# Patient Record
Sex: Female | Born: 1965
Health system: Southern US, Community
[De-identification: ages and names within clinical notes are randomized; demographics above are authoritative.]

## PROBLEM LIST (undated history)

## (undated) DIAGNOSIS — K635 Polyp of colon: Secondary | ICD-10-CM

## (undated) DIAGNOSIS — I071 Rheumatic tricuspid insufficiency: Secondary | ICD-10-CM

## (undated) DIAGNOSIS — N390 Urinary tract infection, site not specified: Secondary | ICD-10-CM

## (undated) DIAGNOSIS — R011 Cardiac murmur, unspecified: Secondary | ICD-10-CM

## (undated) DIAGNOSIS — I340 Nonrheumatic mitral (valve) insufficiency: Secondary | ICD-10-CM

## (undated) DIAGNOSIS — G709 Myoneural disorder, unspecified: Secondary | ICD-10-CM

## (undated) DIAGNOSIS — I10 Essential (primary) hypertension: Secondary | ICD-10-CM

## (undated) DIAGNOSIS — Z8619 Personal history of other infectious and parasitic diseases: Secondary | ICD-10-CM

## (undated) DIAGNOSIS — Z860100 Personal history of colon polyps, unspecified: Secondary | ICD-10-CM

## (undated) DIAGNOSIS — D122 Benign neoplasm of ascending colon: Secondary | ICD-10-CM

## (undated) DIAGNOSIS — F419 Anxiety disorder, unspecified: Secondary | ICD-10-CM

## (undated) DIAGNOSIS — I4891 Unspecified atrial fibrillation: Secondary | ICD-10-CM

## (undated) DIAGNOSIS — E7800 Pure hypercholesterolemia, unspecified: Secondary | ICD-10-CM

## (undated) DIAGNOSIS — R519 Headache, unspecified: Secondary | ICD-10-CM

## (undated) DIAGNOSIS — M069 Rheumatoid arthritis, unspecified: Secondary | ICD-10-CM

## (undated) DIAGNOSIS — R87619 Unspecified abnormal cytological findings in specimens from cervix uteri: Secondary | ICD-10-CM

## (undated) DIAGNOSIS — K8020 Calculus of gallbladder without cholecystitis without obstruction: Secondary | ICD-10-CM

## (undated) DIAGNOSIS — Q600 Renal agenesis, unilateral: Secondary | ICD-10-CM

## (undated) DIAGNOSIS — I471 Supraventricular tachycardia, unspecified: Secondary | ICD-10-CM

## (undated) DIAGNOSIS — Z972 Presence of dental prosthetic device (complete) (partial): Secondary | ICD-10-CM

## (undated) DIAGNOSIS — F4310 Post-traumatic stress disorder, unspecified: Secondary | ICD-10-CM

## (undated) DIAGNOSIS — F329 Major depressive disorder, single episode, unspecified: Secondary | ICD-10-CM

## (undated) DIAGNOSIS — I5189 Other ill-defined heart diseases: Secondary | ICD-10-CM

## (undated) DIAGNOSIS — G629 Polyneuropathy, unspecified: Secondary | ICD-10-CM

## (undated) DIAGNOSIS — E28319 Asymptomatic premature menopause: Secondary | ICD-10-CM

## (undated) DIAGNOSIS — M419 Scoliosis, unspecified: Secondary | ICD-10-CM

## (undated) DIAGNOSIS — Z9884 Bariatric surgery status: Secondary | ICD-10-CM

## (undated) DIAGNOSIS — E611 Iron deficiency: Secondary | ICD-10-CM

## (undated) DIAGNOSIS — E559 Vitamin D deficiency, unspecified: Secondary | ICD-10-CM

## (undated) DIAGNOSIS — E079 Disorder of thyroid, unspecified: Secondary | ICD-10-CM

## (undated) DIAGNOSIS — F32A Depression, unspecified: Secondary | ICD-10-CM

## (undated) DIAGNOSIS — M5136 Other intervertebral disc degeneration, lumbar region: Secondary | ICD-10-CM

## (undated) DIAGNOSIS — G894 Chronic pain syndrome: Secondary | ICD-10-CM

## (undated) DIAGNOSIS — E538 Deficiency of other specified B group vitamins: Secondary | ICD-10-CM

## (undated) DIAGNOSIS — M51369 Other intervertebral disc degeneration, lumbar region without mention of lumbar back pain or lower extremity pain: Secondary | ICD-10-CM

## (undated) DIAGNOSIS — D509 Iron deficiency anemia, unspecified: Secondary | ICD-10-CM

## (undated) DIAGNOSIS — R51 Headache: Secondary | ICD-10-CM

## (undated) HISTORY — PX: ROUX-EN-Y PROCEDURE: SUR1287

## (undated) HISTORY — DX: Vitamin D deficiency, unspecified: E55.9

## (undated) HISTORY — DX: Urinary tract infection, site not specified: N39.0

## (undated) HISTORY — DX: Unspecified abnormal cytological findings in specimens from cervix uteri: R87.619

## (undated) HISTORY — DX: Cardiac murmur, unspecified: R01.1

## (undated) HISTORY — DX: Renal agenesis, unilateral: Q60.0

## (undated) HISTORY — DX: Bariatric surgery status: Z98.84

## (undated) HISTORY — DX: Depression, unspecified: F32.A

## (undated) HISTORY — DX: Personal history of colonic polyps: Z86.010

## (undated) HISTORY — DX: Essential (primary) hypertension: I10

## (undated) HISTORY — DX: Deficiency of other specified B group vitamins: E53.8

## (undated) HISTORY — DX: Polyp of colon: K63.5

## (undated) HISTORY — DX: Anxiety disorder, unspecified: F41.9

## (undated) HISTORY — PX: COSMETIC SURGERY: SHX468

## (undated) HISTORY — DX: Asymptomatic premature menopause: E28.319

## (undated) HISTORY — DX: Headache, unspecified: R51.9

## (undated) HISTORY — DX: Other intervertebral disc degeneration, lumbar region without mention of lumbar back pain or lower extremity pain: M51.369

## (undated) HISTORY — DX: Rheumatoid arthritis, unspecified: M06.9

## (undated) HISTORY — DX: Pure hypercholesterolemia, unspecified: E78.00

## (undated) HISTORY — DX: Unspecified atrial fibrillation: I48.91

## (undated) HISTORY — PX: TOTAL KNEE ARTHROPLASTY: SHX125

## (undated) HISTORY — DX: Benign neoplasm of ascending colon: D12.2

## (undated) HISTORY — DX: Personal history of other infectious and parasitic diseases: Z86.19

## (undated) HISTORY — PX: BREAST ENHANCEMENT SURGERY: SHX7

## (undated) HISTORY — DX: Scoliosis, unspecified: M41.9

## (undated) HISTORY — PX: OTHER SURGICAL HISTORY: SHX169

## (undated) HISTORY — DX: Disorder of thyroid, unspecified: E07.9

## (undated) HISTORY — DX: Chronic pain syndrome: G89.4

## (undated) HISTORY — DX: Calculus of gallbladder without cholecystitis without obstruction: K80.20

## (undated) HISTORY — DX: Headache: R51

## (undated) HISTORY — PX: KNEE ARTHROSCOPY: SUR90

## (undated) HISTORY — DX: Other intervertebral disc degeneration, lumbar region: M51.36

## (undated) HISTORY — DX: Major depressive disorder, single episode, unspecified: F32.9

## (undated) HISTORY — DX: Polyneuropathy, unspecified: G62.9

## (undated) HISTORY — DX: Personal history of colon polyps, unspecified: Z86.0100

## (undated) HISTORY — DX: Myoneural disorder, unspecified: G70.9

## (undated) HISTORY — DX: Iron deficiency: E61.1

## (undated) HISTORY — PX: SPINE SURGERY: SHX786

## (undated) HISTORY — PX: JOINT REPLACEMENT: SHX530

## (undated) MED FILL — Iron Sucrose Inj 20 MG/ML (Fe Equiv): INTRAVENOUS | Qty: 10 | Status: AC

---
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
## 2007-06-01 HISTORY — PX: AUGMENTATION MAMMAPLASTY: SUR837

## 2014-11-02 DIAGNOSIS — Z9884 Bariatric surgery status: Secondary | ICD-10-CM | POA: Insufficient documentation

## 2014-11-04 DIAGNOSIS — M4327 Fusion of spine, lumbosacral region: Secondary | ICD-10-CM | POA: Insufficient documentation

## 2014-11-04 HISTORY — PX: BACK SURGERY: SHX140

## 2014-11-08 DIAGNOSIS — M419 Scoliosis, unspecified: Secondary | ICD-10-CM | POA: Insufficient documentation

## 2014-12-11 DIAGNOSIS — Z5181 Encounter for therapeutic drug level monitoring: Secondary | ICD-10-CM | POA: Insufficient documentation

## 2015-10-16 DIAGNOSIS — Z905 Acquired absence of kidney: Secondary | ICD-10-CM | POA: Insufficient documentation

## 2015-10-16 DIAGNOSIS — D649 Anemia, unspecified: Secondary | ICD-10-CM | POA: Insufficient documentation

## 2017-01-26 DIAGNOSIS — Q600 Renal agenesis, unilateral: Secondary | ICD-10-CM | POA: Insufficient documentation

## 2017-01-27 DIAGNOSIS — G894 Chronic pain syndrome: Secondary | ICD-10-CM | POA: Insufficient documentation

## 2017-01-27 DIAGNOSIS — M5137 Other intervertebral disc degeneration, lumbosacral region: Secondary | ICD-10-CM | POA: Insufficient documentation

## 2017-01-27 DIAGNOSIS — M5136 Other intervertebral disc degeneration, lumbar region: Secondary | ICD-10-CM | POA: Insufficient documentation

## 2017-02-02 DIAGNOSIS — E7800 Pure hypercholesterolemia, unspecified: Secondary | ICD-10-CM | POA: Insufficient documentation

## 2017-03-26 DIAGNOSIS — R87810 Cervical high risk human papillomavirus (HPV) DNA test positive: Secondary | ICD-10-CM | POA: Insufficient documentation

## 2017-05-25 ENCOUNTER — Other Ambulatory Visit: Payer: Self-pay

## 2017-05-25 ENCOUNTER — Encounter: Payer: Self-pay | Admitting: Gastroenterology

## 2017-05-25 ENCOUNTER — Ambulatory Visit (INDEPENDENT_AMBULATORY_CARE_PROVIDER_SITE_OTHER): Payer: 59 | Admitting: Gastroenterology

## 2017-05-25 ENCOUNTER — Encounter (INDEPENDENT_AMBULATORY_CARE_PROVIDER_SITE_OTHER): Payer: Self-pay

## 2017-05-25 VITALS — BP 141/93 | HR 101 | Ht 62.0 in | Wt 140.4 lb

## 2017-05-25 DIAGNOSIS — Z9884 Bariatric surgery status: Secondary | ICD-10-CM

## 2017-05-25 DIAGNOSIS — Z1212 Encounter for screening for malignant neoplasm of rectum: Secondary | ICD-10-CM | POA: Diagnosis not present

## 2017-05-25 DIAGNOSIS — Z1211 Encounter for screening for malignant neoplasm of colon: Secondary | ICD-10-CM

## 2017-05-25 NOTE — Progress Notes (Signed)
Shelby Colon [address]  [place], [place] [address]  Main: [phone]  Fax: [phone]   Gastroenterology Consultation  Referring Provider:     Royal Piedra, [place] Primary Care Physician:  Royal Piedra, [place] Primary Gastroenterologist:  Dr. Vonda Colon Reason for Consultation:    Positive Cologuard        HPI:   Shelby Colon is a 51 y.o. y/o female referred for consultation & management  by Dr. Stan Head, Toney Sang, [place].  Patient with history of chronic pain, history of gastric bypass referred for positive cologuard testing done as part of her annual physical exam.  Patient denies any blood in stool.  Patient denies any weight loss recently.  Denies any abdominal pain.  Denies any nausea vomiting.  Denies any dysphagia.  Patient is adopted, but states that she knows that her biological parents and biological family did not have any history of colon cancer.  She reports previous history of iron deficiency and used to receive IV iron transfusions.  PMHx and PSHx: Chronic pain, Hypercholesterolemia, Scoliosis, Disc Degeneration, Gastric bypass   Meds: Reviewed      Family Hx: Diabetes in biological parents Social Hx: Reviewed, no smoking, alcohol or drugs   Allergies: Morphine  Review of Systems:    All systems reviewed and negative except where noted in HPI.   Physical Exam:   Vitals: reviewed  Psych:  Alert and cooperative. Normal mood and affect. General:   Alert,  Well-developed, well-nourished, pleasant and cooperative in NAD Head:  Normocephalic and atraumatic. Eyes:  Sclera clear, no icterus.   Conjunctiva pink. Ears:  Normal auditory acuity. Nose:  No deformity, discharge, or lesions. Mouth:  No deformity or lesions,oropharynx pink & moist. Neck:  Supple; no masses or thyromegaly. Lungs:  Respirations even and unlabored.  Clear throughout to auscultation.   No wheezes, crackles, or rhonchi. No acute distress. Heart:   Regular rate and rhythm; no murmurs, clicks, rubs, or gallops. Abdomen:  Normal bowel sounds.  No bruits.  Soft, non-tender and non-distended without masses, hepatosplenomegaly or hernias noted.  No guarding or rebound tenderness.    Msk:  Symmetrical without gross deformities. Good, equal movement & strength bilaterally. Pulses:  Normal pulses noted. Extremities:  No clubbing or edema.  No cyanosis. Neurologic:  Alert and oriented x3;  grossly normal neurologically. Skin:  Intact without significant lesions or rashes. No jaundice. Lymph Nodes:  No significant cervical adenopathy. Psych:  Alert and cooperative. Normal mood and affect.   Labs: Scanned Labs from Nov 2018 in media reviewed and show normal liver enzymes.  CBC available on patient's my chart that she was able to pull up on her phone were reviewed and did not show any anemia.  Hemoglobin was around 12.  MCV was 83.   Assessment and Plan:   Shelby Colon is a 51 y.o. y/o female has been referred for positive Cologuard testing with no other abdominal symptoms.  Patient is 51 years old and is due for CRC screening In addition has a positive colo guard test Due to the above reasons we will schedule her for colonoscopy No other alarm symptoms present No family history of colon cancer We will also check iron levels as they have not been repeated in the recent past and patient reports previous history of iron deficiency which could be due to her previous history of gastric bypass.  She is not on any NSAIDs denies any abdominal symptoms or abdominal pain.  No dysphagia, no weight loss, no alarm symptoms.  Therefore no indications for EGD at this time.  I have discussed alternative options, risks & benefits,  which include, but are not limited to, bleeding, infection, perforation,respiratory complication & drug reaction.  The patient agrees with this plan & written consent will be obtained.      Dr Shelby Colon

## 2017-05-25 NOTE — Patient Instructions (Signed)
Screening for colonoscopy for 06/03/17 with Dr. Daphane Shepherd At [facility] pm Labs ordered: Iron, Ferritin, TIBC

## 2017-05-26 ENCOUNTER — Other Ambulatory Visit: Payer: Self-pay

## 2017-05-26 DIAGNOSIS — Z1211 Encounter for screening for malignant neoplasm of colon: Secondary | ICD-10-CM

## 2017-05-26 NOTE — Addendum Note (Signed)
Addended by: Earl Lagos on: 05/26/2017 12:48 PM   Modules accepted: Orders, SmartSet

## 2017-06-06 ENCOUNTER — Telehealth: Payer: Self-pay | Admitting: Gastroenterology

## 2017-06-06 NOTE — Telephone Encounter (Signed)
Patient wants to cancel  her colonoscopy scheduled for Friday 06-10-17.She has to go out of town for her sick father-in-law.

## 2017-06-06 NOTE — Telephone Encounter (Signed)
Patient has requested to cancel her colonoscopy due to her father being sick.  She will reschedule at a later time. Endo has been informed of the cancelation.  Thanks [employer]

## 2017-06-10 ENCOUNTER — Ambulatory Visit: Admission: RE | Admit: 2017-06-10 | Payer: 59 | Source: Ambulatory Visit | Admitting: Gastroenterology

## 2017-06-10 ENCOUNTER — Encounter: Admission: RE | Payer: Self-pay | Source: Ambulatory Visit

## 2017-06-10 SURGERY — COLONOSCOPY WITH PROPOFOL
Anesthesia: General

## 2017-09-09 ENCOUNTER — Telehealth: Payer: Self-pay | Admitting: Gastroenterology

## 2017-09-09 NOTE — Telephone Encounter (Signed)
Attempted to call pt to reschedule colonoscopy pt states she is recovering from kneww surgery and does not want to reschedule at this time

## 2017-09-15 DIAGNOSIS — Z96659 Presence of unspecified artificial knee joint: Secondary | ICD-10-CM | POA: Insufficient documentation

## 2017-10-04 DIAGNOSIS — M1710 Unilateral primary osteoarthritis, unspecified knee: Secondary | ICD-10-CM | POA: Insufficient documentation

## 2017-10-04 DIAGNOSIS — M179 Osteoarthritis of knee, unspecified: Secondary | ICD-10-CM | POA: Insufficient documentation

## 2017-11-30 ENCOUNTER — Ambulatory Visit: Payer: 59 | Admitting: Internal Medicine

## 2017-11-30 ENCOUNTER — Encounter: Payer: Self-pay | Admitting: Internal Medicine

## 2017-11-30 VITALS — BP 142/94 | HR 92 | Temp 98.6°F | Ht 62.0 in | Wt 148.2 lb

## 2017-11-30 DIAGNOSIS — D75839 Thrombocytosis, unspecified: Secondary | ICD-10-CM

## 2017-11-30 DIAGNOSIS — E611 Iron deficiency: Secondary | ICD-10-CM

## 2017-11-30 DIAGNOSIS — F419 Anxiety disorder, unspecified: Secondary | ICD-10-CM | POA: Insufficient documentation

## 2017-11-30 DIAGNOSIS — R195 Other fecal abnormalities: Secondary | ICD-10-CM

## 2017-11-30 DIAGNOSIS — Z13220 Encounter for screening for lipoid disorders: Secondary | ICD-10-CM

## 2017-11-30 DIAGNOSIS — F329 Major depressive disorder, single episode, unspecified: Secondary | ICD-10-CM

## 2017-11-30 DIAGNOSIS — M2550 Pain in unspecified joint: Secondary | ICD-10-CM

## 2017-11-30 DIAGNOSIS — Z1231 Encounter for screening mammogram for malignant neoplasm of breast: Secondary | ICD-10-CM | POA: Diagnosis not present

## 2017-11-30 DIAGNOSIS — B977 Papillomavirus as the cause of diseases classified elsewhere: Secondary | ICD-10-CM

## 2017-11-30 DIAGNOSIS — E559 Vitamin D deficiency, unspecified: Secondary | ICD-10-CM | POA: Insufficient documentation

## 2017-11-30 DIAGNOSIS — I10 Essential (primary) hypertension: Secondary | ICD-10-CM | POA: Insufficient documentation

## 2017-11-30 DIAGNOSIS — G4700 Insomnia, unspecified: Secondary | ICD-10-CM | POA: Insufficient documentation

## 2017-11-30 DIAGNOSIS — D473 Essential (hemorrhagic) thrombocythemia: Secondary | ICD-10-CM

## 2017-11-30 DIAGNOSIS — Z1389 Encounter for screening for other disorder: Secondary | ICD-10-CM

## 2017-11-30 DIAGNOSIS — F32A Depression, unspecified: Secondary | ICD-10-CM

## 2017-11-30 DIAGNOSIS — R87612 Low grade squamous intraepithelial lesion on cytologic smear of cervix (LGSIL): Secondary | ICD-10-CM

## 2017-11-30 DIAGNOSIS — R030 Elevated blood-pressure reading, without diagnosis of hypertension: Secondary | ICD-10-CM

## 2017-11-30 DIAGNOSIS — I341 Nonrheumatic mitral (valve) prolapse: Secondary | ICD-10-CM

## 2017-11-30 DIAGNOSIS — Z780 Asymptomatic menopausal state: Secondary | ICD-10-CM | POA: Insufficient documentation

## 2017-11-30 HISTORY — DX: Depression, unspecified: F32.A

## 2017-11-30 HISTORY — DX: Papillomavirus as the cause of diseases classified elsewhere: B97.7

## 2017-11-30 MED ORDER — ALPRAZOLAM 0.25 MG PO TABS: ORAL_TABLET | ORAL | 2 refills | Status: DC

## 2017-11-30 MED ORDER — BUPROPION HCL ER (XL) 150 MG PO TB24: 150.0000 mg | ORAL_TABLET | Freq: Every day | ORAL | 1 refills | Status: DC

## 2017-11-30 NOTE — Progress Notes (Signed)
Chief Complaint  Patient presents with  . New Patient (Initial Visit)   NP 1. Elevated BP 142/94 she has chronic pain due to back and right knee and not sure if related to stress of pain BP 140s/80s-90s elevated in am not been on medication  2. C/o early menopause at age 52 y.o and with that comes sweating at night, reduce libido, moody, agitated, depressed PHQ 9 score 11 today. Also c/o insomnia. She has tried melatonin and xanax former PCP had her own 0.5 bid prn   ROS Past Medical History:  Diagnosis Date  . Anxiety   . B12 deficiency   . Chronic pain syndrome   . Congenital absence of right kidney   . Degeneration of lumbar intervertebral disc   . Depression   . Early menopause   . Gastric bypass status for obesity    2013, weight 258lb  . Headache   . Heart murmur    MVP  . History of chicken pox   . Hypercholesterolemia   . Hypertension   . Iron deficiency   . Neuropathy   . Rheumatoid arthritis ([place])    as child   . Scoliosis of lumbar spine   . Solitary kidney, congenital    pt thinks has left kidney   . Thyroid disease    in the past   . UTI (urinary tract infection)   . Vitamin D deficiency    Past Surgical History:  Procedure Laterality Date  . AUGMENTATION MAMMAPLASTY    . BACK SURGERY  11/04/2014   fusion of spine of lumbosacral region  . excess skin removal    . KNEE ARTHROSCOPY Bilateral   . ROUX-EN-Y PROCEDURE     Pt is adopted but knows some of family history   Family History  Adopted: Yes  Problem Relation Age of Onset  . Arthritis Mother   . Drug abuse Mother        heriod OD  . Early death Mother   . Diabetes Father   . Hypertension Father   . Depression Father   . Arthritis Father   . Drug abuse Father   . Diabetes Sister   . Fibromyalgia Sister   . Rashes / Skin problems Sister        PSORIASIS  . Diabetes Brother   . Osteoarthritis Maternal Grandmother   . Aneurysm Maternal Aunt   . Thyroid disease Maternal Aunt   .  Fibromyalgia Maternal Aunt   . Cancer Maternal Aunt        breast cancer    Social History   Socioeconomic History  . Marital status: Married    Spouse name: Not on file  . Number of children: Not on file  . Years of education: Not on file  . Highest education level: Not on file  Occupational History  . Not on file  Social Needs  . Financial resource strain: Not on file  . Food insecurity:    Worry: Not on file    Inability: Not on file  . Transportation needs:    Medical: Not on file    Non-medical: Not on file  Tobacco Use  . Smoking status: Never Smoker  . Smokeless tobacco: Never Used  Substance and Sexual Activity  . Alcohol use: Yes    Frequency: Never  . Drug use: No  . Sexual activity: Yes  Lifestyle  . Physical activity:    Days per week: Not on file    Minutes per session: Not  on file  . Stress: Not on file  Relationships  . Social connections:    Talks on phone: Not on file    Gets together: Not on file    Attends religious service: Not on file    Active member of club or organization: Not on file    Attends meetings of clubs or organizations: Not on file    Relationship status: Not on file  . Intimate partner violence:    Fear of current or ex partner: Not on file    Emotionally abused: Not on file    Physically abused: Not on file    Forced sexual activity: Not on file  Other Topics Concern  . 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 Secretary/administrator    BA degree    No guns, wears seat belt, safe in relationship    Current Meds  Medication Sig  . Cholecalciferol (VITAMIN D3) 5000 units CAPS Take by mouth.  . cyclobenzaprine (FLEXERIL) 10 MG tablet Take 10 mg by mouth 3 (three) times daily as needed.   . gabapentin (NEURONTIN) 600 MG tablet Take by mouth.  . Multiple Vitamin (MULTIVITAMIN)  capsule Take by mouth.  . NON FORMULARY Liquid vitamin Miracle 2000  . NONFORMULARY OR COMPOUNDED ITEM COMPOUNDED MEDICATION  Baclofen 2% Cyclobenzaprine 2% Diclofenac 3% Lidocaine 3%  4 times a day as needed for knee  . UNABLE TO FIND Med Name: B12 W/P2P   Allergies  Allergen Reactions  . Morphine Nausea And Vomiting    Other reaction(s): Other (See Comments)  . Oxycodone-Acetaminophen Nausea Only   No results found for this or any previous visit (from the past 2160 hour(s)). Objective  Body mass index is 27.11 kg/m. Wt Readings from Last 3 Encounters:  11/30/17 148 lb 3.2 oz (67.2 kg)  05/25/17 140 lb 6.4 oz (63.7 kg)   Temp Readings from Last 3 Encounters:  11/30/17 98.6 F (37 C) (Oral)   BP Readings from Last 3 Encounters:  11/30/17 (!) 142/94  05/25/17 (!) 141/93   Pulse Readings from Last 3 Encounters:  11/30/17 92  05/25/17 (!) 101    Physical Exam  Assessment   1. Depression and anxiety PHQ 9 11, insomnia sleeping 3 hours at night  2. Menopause early age 38  3. H/o vit D def 4. H/o cardiac murmur and MVP and heart skipping  5. HM 6. Elevated Blood pressure   Plan   1. Start wellbutrin 150 xl qd and xanax 0.25 bid prn or 1-2 pills qhs  2. Hold on hormonal tx for now if needed in future consider OB/GYN address or hormone clinic I.e [facility] in [place]  3.  Takes 5000 IU D3 qd and 10K IU qod  Vit D normal 02/01/17 see below  4. Consider cardiology referral in the future  5.  declines flu shot  Tdap had 02/01/17   LMP age 63 early menopause  H/o abnormal pap 03/21/17 HPV bx 04/08/17 HPV LSIL  -referred to OB/GYN Dr. Star Age  cologuard +04/14/17 - Referred to [place] referred for 3 D with implants   Vitamin D 32.2 02/01/17, B12 595 02/01/17 s/p gastric bypass lost 125 lbs  No need for dermatology now  TSH had 02/01/17 normal   Orthopedic Dr. Tora Perches Eye MD  [facility] [place]  6. Consider meds if continues to  be elevated   Provider: Dr. Olivia Mackie McLean-Scocuzza-[facility]

## 2017-11-30 NOTE — Patient Instructions (Addendum)
Try insight timer, calm or headspace mediation for sleep  Take wellbutrin daily in am and xanax 1-2 x per day  sch fasting labs and f/u in 6 weeks   Menopause Menopause is the normal time of life when menstrual periods stop completely. Menopause is complete when you have missed 12 consecutive menstrual periods. It usually occurs between the ages of 4 years and 53 years. Very rarely does a woman develop menopause before the age of 68 years. At menopause, your ovaries stop producing the female hormones estrogen and progesterone. This can cause undesirable symptoms and also affect your health. Sometimes the symptoms may occur 4-5 years before the menopause begins. There is no relationship between menopause and:  Oral contraceptives.  Number of children you had.  Race.  The age your menstrual periods started (menarche).  Heavy smokers and very thin women may develop menopause earlier in life. What are the causes?  The ovaries stop producing the female hormones estrogen and progesterone. Other causes include:  Surgery to remove both ovaries.  The ovaries stop functioning for no known reason.  Tumors of the pituitary gland in the brain.  Medical disease that affects the ovaries and hormone production.  Radiation treatment to the abdomen or pelvis.  Chemotherapy that affects the ovaries.  What are the signs or symptoms?  Hot flashes.  Night sweats.  Decrease in sex drive.  Vaginal dryness and thinning of the vagina causing painful intercourse.  Dryness of the skin and developing wrinkles.  Headaches.  Tiredness.  Irritability.  Memory problems.  Weight gain.  Bladder infections.  Hair growth of the face and chest.  Infertility. More serious symptoms include:  Loss of bone (osteoporosis) causing breaks (fractures).  Depression.  Hardening and narrowing of the arteries (atherosclerosis) causing heart attacks and strokes.  How is this diagnosed?  When the  menstrual periods have stopped for 12 straight months.  Physical exam.  Hormone studies of the blood. How is this treated? There are many treatment choices and nearly as many questions about them. The decisions to treat or not to treat menopausal changes is an individual choice made with your health care provider. Your health care provider can discuss the treatments with you. Together, you can decide which treatment will work best for you. Your treatment choices may include:  Hormone therapy (estrogen and progesterone).  Non-hormonal medicines.  Treating the individual symptoms with medicine (for example antidepressants for depression).  Herbal medicines that may help specific symptoms.  Counseling by a psychiatrist or psychologist.  Group therapy.  Lifestyle changes including: ? Eating healthy. ? Regular exercise. ? Limiting caffeine and alcohol. ? Stress management and meditation.  No treatment.  Follow these instructions at home:  Take the medicine your health care provider gives you as directed.  Get plenty of sleep and rest.  Exercise regularly.  Eat a diet that contains calcium (good for the bones) and soy products (acts like estrogen hormone).  Avoid alcoholic beverages.  Do not smoke.  If you have hot flashes, dress in layers.  Take supplements, calcium, and vitamin D to strengthen bones.  You can use over-the-counter lubricants or moisturizers for vaginal dryness.  Group therapy is sometimes very helpful.  Acupuncture may be helpful in some cases. Contact a health care provider if:  You are not sure you are in menopause.  You are having menopausal symptoms and need advice and treatment.  You are still having menstrual periods after age 6 years.  You have pain with intercourse.  Menopause is complete (no menstrual period for 12 months) and you develop vaginal bleeding.  You need a referral to a specialist (gynecologist, psychiatrist, or  psychologist) for treatment. Get help right away if:  You have severe depression.  You have excessive vaginal bleeding.  You fell and think you have a broken bone.  You have pain when you urinate.  You develop leg or chest pain.  You have a fast pounding heart beat (palpitations).  You have severe headaches.  You develop vision problems.  You feel a lump in your breast.  You have abdominal pain or severe indigestion. This information is not intended to replace advice given to you by your health care provider. Make sure you discuss any questions you have with your health care provider. Document Released: 08/07/2003 Document Revised: 10/23/2015 Document Reviewed: 12/14/2012 Elsevier Interactive Patient Education  2017 [employer].

## 2017-12-05 ENCOUNTER — Other Ambulatory Visit (INDEPENDENT_AMBULATORY_CARE_PROVIDER_SITE_OTHER): Payer: 59

## 2017-12-05 DIAGNOSIS — D473 Essential (hemorrhagic) thrombocythemia: Secondary | ICD-10-CM | POA: Diagnosis not present

## 2017-12-05 DIAGNOSIS — Z1389 Encounter for screening for other disorder: Secondary | ICD-10-CM

## 2017-12-05 DIAGNOSIS — E611 Iron deficiency: Secondary | ICD-10-CM

## 2017-12-05 DIAGNOSIS — R030 Elevated blood-pressure reading, without diagnosis of hypertension: Secondary | ICD-10-CM

## 2017-12-05 DIAGNOSIS — M2550 Pain in unspecified joint: Secondary | ICD-10-CM | POA: Diagnosis not present

## 2017-12-05 DIAGNOSIS — Z13220 Encounter for screening for lipoid disorders: Secondary | ICD-10-CM

## 2017-12-05 DIAGNOSIS — D75839 Thrombocytosis, unspecified: Secondary | ICD-10-CM

## 2017-12-05 NOTE — Addendum Note (Signed)
Addended by: Arby Barrette on: 12/05/2017 10:44 AM   Modules accepted: Orders

## 2017-12-06 LAB — URINALYSIS, ROUTINE W REFLEX MICROSCOPIC
BILIRUBIN UA: NEGATIVE
GLUCOSE, UA: NEGATIVE
KETONES UA: NEGATIVE
NITRITE UA: NEGATIVE
Protein, UA: NEGATIVE
RBC UA: NEGATIVE
SPEC GRAV UA: 1.014 (ref 1.005–1.030)
UUROB: 0.2 mg/dL (ref 0.2–1.0)
pH, UA: 6 (ref 5.0–7.5)

## 2017-12-06 LAB — MICROSCOPIC EXAMINATION: Casts: NONE SEEN /lpf

## 2017-12-07 LAB — CBC WITH DIFFERENTIAL/PLATELET
BASOS PCT: 0.9 %
Basophils Absolute: 73 cells/uL (ref 0–200)
EOS ABS: 113 {cells}/uL (ref 15–500)
EOS PCT: 1.4 %
HCT: 36.7 % (ref 35.0–45.0)
Hemoglobin: 12.1 g/dL (ref 11.7–15.5)
Lymphs Abs: 1580 cells/uL (ref 850–3900)
MCH: 27.3 pg (ref 27.0–33.0)
MCHC: 33 g/dL (ref 32.0–36.0)
MCV: 82.7 fL (ref 80.0–100.0)
MONOS PCT: 4.8 %
MPV: 10.7 fL (ref 7.5–12.5)
Neutro Abs: 5945 cells/uL (ref 1500–7800)
Neutrophils Relative %: 73.4 %
PLATELETS: 507 10*3/uL — AB (ref 140–400)
RBC: 4.44 10*6/uL (ref 3.80–5.10)
RDW: 12.5 % (ref 11.0–15.0)
Total Lymphocyte: 19.5 %
WBC mixed population: 389 cells/uL (ref 200–950)
WBC: 8.1 10*3/uL (ref 3.8–10.8)

## 2017-12-07 LAB — IRON,TIBC AND FERRITIN PANEL
%SAT: 31 % (ref 16–45)
FERRITIN: 34 ng/mL (ref 16–232)
Iron: 110 ug/dL (ref 45–160)
TIBC: 351 mcg/dL (calc) (ref 250–450)

## 2017-12-07 LAB — LIPID PANEL
CHOLESTEROL: 196 mg/dL (ref <200)
HDL: 108 mg/dL (ref >50)
LDL CHOLESTEROL (CALC): 74 mg/dL
Non-HDL Cholesterol (Calc): 88 mg/dL (calc) (ref <130)
TRIGLYCERIDES: 62 mg/dL (ref <150)
Total CHOL/HDL Ratio: 1.8 (calc) (ref <5.0)

## 2017-12-07 LAB — COMPREHENSIVE METABOLIC PANEL
AG Ratio: 1.7 (calc) (ref 1.0–2.5)
ALBUMIN MSPROF: 4.5 g/dL (ref 3.6–5.1)
ALT: 37 U/L — ABNORMAL HIGH (ref 6–29)
AST: 35 U/L (ref 10–35)
Alkaline phosphatase (APISO): 152 U/L — ABNORMAL HIGH (ref 33–130)
BUN: 9 mg/dL (ref 7–25)
CO2: 22 mmol/L (ref 20–32)
CREATININE: 0.74 mg/dL (ref 0.50–1.05)
Calcium: 9.8 mg/dL (ref 8.6–10.4)
Chloride: 104 mmol/L (ref 98–110)
GLUCOSE: 111 mg/dL — AB (ref 65–99)
Globulin: 2.7 g/dL (calc) (ref 1.9–3.7)
Potassium: 4.1 mmol/L (ref 3.5–5.3)
SODIUM: 141 mmol/L (ref 135–146)
TOTAL PROTEIN: 7.2 g/dL (ref 6.1–8.1)
Total Bilirubin: 0.3 mg/dL (ref 0.2–1.2)

## 2017-12-07 LAB — C-REACTIVE PROTEIN: CRP: 9.8 mg/L — ABNORMAL HIGH (ref <8.0)

## 2017-12-07 LAB — CYCLIC CITRUL PEPTIDE ANTIBODY, IGG: Cyclic Citrullin Peptide Ab: 114 UNITS — ABNORMAL HIGH

## 2017-12-07 LAB — RHEUMATOID FACTOR

## 2017-12-07 LAB — SEDIMENTATION RATE: Sed Rate: 25 mm/h — ABNORMAL HIGH (ref 0–20)

## 2017-12-07 LAB — ANA: Anti Nuclear Antibody(ANA): NEGATIVE

## 2017-12-09 ENCOUNTER — Encounter: Payer: Self-pay | Admitting: Internal Medicine

## 2017-12-09 ENCOUNTER — Other Ambulatory Visit: Payer: Self-pay

## 2017-12-09 DIAGNOSIS — R195 Other fecal abnormalities: Secondary | ICD-10-CM

## 2017-12-16 ENCOUNTER — Other Ambulatory Visit: Payer: Self-pay | Admitting: Internal Medicine

## 2017-12-16 DIAGNOSIS — R768 Other specified abnormal immunological findings in serum: Secondary | ICD-10-CM

## 2017-12-20 ENCOUNTER — Other Ambulatory Visit: Payer: Self-pay

## 2017-12-20 ENCOUNTER — Other Ambulatory Visit: Payer: Self-pay | Admitting: Internal Medicine

## 2017-12-20 ENCOUNTER — Encounter: Payer: Self-pay | Admitting: Internal Medicine

## 2017-12-20 DIAGNOSIS — M549 Dorsalgia, unspecified: Principal | ICD-10-CM

## 2017-12-20 DIAGNOSIS — G8929 Other chronic pain: Secondary | ICD-10-CM

## 2017-12-20 MED ORDER — GABAPENTIN 600 MG PO TABS: 600.0000 mg | ORAL_TABLET | Freq: Three times a day (TID) | ORAL | 2 refills | Status: DC

## 2017-12-20 MED ORDER — CYCLOBENZAPRINE HCL 10 MG PO TABS: 10.0000 mg | ORAL_TABLET | Freq: Three times a day (TID) | ORAL | 2 refills | Status: DC | PRN

## 2017-12-24 ENCOUNTER — Encounter: Payer: Self-pay | Admitting: Gastroenterology

## 2018-01-03 ENCOUNTER — Ambulatory Visit
Admission: RE | Admit: 2018-01-03 | Discharge: 2018-01-03 | Disposition: A | Discharge status: Home / Self Care | Payer: 59 | Source: Ambulatory Visit | Attending: Internal Medicine | Admitting: Internal Medicine

## 2018-01-03 DIAGNOSIS — Z1231 Encounter for screening mammogram for malignant neoplasm of breast: Secondary | ICD-10-CM | POA: Insufficient documentation

## 2018-01-06 ENCOUNTER — Encounter: Payer: Self-pay | Admitting: Anesthesiology

## 2018-01-06 ENCOUNTER — Ambulatory Visit: Payer: 59 | Admitting: Anesthesiology

## 2018-01-06 ENCOUNTER — Encounter
Admission: RE | Disposition: A | Discharge status: Home / Self Care | Payer: Self-pay | Source: Ambulatory Visit | Attending: Gastroenterology

## 2018-01-06 ENCOUNTER — Ambulatory Visit
Admission: RE | Admit: 2018-01-06 | Discharge: 2018-01-06 | Disposition: A | Discharge status: Home / Self Care | Payer: 59 | Source: Ambulatory Visit | Attending: Gastroenterology | Admitting: Gastroenterology

## 2018-01-06 DIAGNOSIS — E559 Vitamin D deficiency, unspecified: Secondary | ICD-10-CM | POA: Insufficient documentation

## 2018-01-06 DIAGNOSIS — D126 Benign neoplasm of colon, unspecified: Secondary | ICD-10-CM | POA: Diagnosis not present

## 2018-01-06 DIAGNOSIS — Z9884 Bariatric surgery status: Secondary | ICD-10-CM | POA: Insufficient documentation

## 2018-01-06 DIAGNOSIS — R195 Other fecal abnormalities: Secondary | ICD-10-CM | POA: Diagnosis not present

## 2018-01-06 DIAGNOSIS — D122 Benign neoplasm of ascending colon: Secondary | ICD-10-CM

## 2018-01-06 DIAGNOSIS — Q600 Renal agenesis, unilateral: Secondary | ICD-10-CM | POA: Diagnosis not present

## 2018-01-06 DIAGNOSIS — G629 Polyneuropathy, unspecified: Secondary | ICD-10-CM | POA: Diagnosis not present

## 2018-01-06 DIAGNOSIS — F419 Anxiety disorder, unspecified: Secondary | ICD-10-CM | POA: Diagnosis not present

## 2018-01-06 DIAGNOSIS — Z1211 Encounter for screening for malignant neoplasm of colon: Secondary | ICD-10-CM | POA: Insufficient documentation

## 2018-01-06 DIAGNOSIS — F329 Major depressive disorder, single episode, unspecified: Secondary | ICD-10-CM | POA: Insufficient documentation

## 2018-01-06 DIAGNOSIS — Z79899 Other long term (current) drug therapy: Secondary | ICD-10-CM | POA: Insufficient documentation

## 2018-01-06 DIAGNOSIS — K635 Polyp of colon: Secondary | ICD-10-CM | POA: Diagnosis not present

## 2018-01-06 DIAGNOSIS — I10 Essential (primary) hypertension: Secondary | ICD-10-CM | POA: Diagnosis not present

## 2018-01-06 HISTORY — PX: COLONOSCOPY WITH PROPOFOL: SHX5780

## 2018-01-06 LAB — POCT PREGNANCY, URINE: Preg Test, Ur: NEGATIVE

## 2018-01-06 SURGERY — COLONOSCOPY WITH PROPOFOL
Anesthesia: General

## 2018-01-06 MED ORDER — PROPOFOL 10 MG/ML IV BOLUS
INTRAVENOUS | Status: DC | PRN
  Administered 2018-01-06 (×2): 50 mg via INTRAVENOUS

## 2018-01-06 MED ORDER — PROPOFOL 500 MG/50ML IV EMUL
INTRAVENOUS | Status: AC
  Filled 2018-01-06: qty 50

## 2018-01-06 MED ORDER — SODIUM CHLORIDE 0.9 % IV SOLN
INTRAVENOUS | Status: DC
  Administered 2018-01-06: 1000 mL via INTRAVENOUS

## 2018-01-06 MED ORDER — LIDOCAINE HCL (CARDIAC) PF 100 MG/5ML IV SOSY
PREFILLED_SYRINGE | INTRAVENOUS | Status: DC | PRN
  Administered 2018-01-06: 30 mg via INTRAVENOUS

## 2018-01-06 MED ORDER — PROPOFOL 500 MG/50ML IV EMUL
INTRAVENOUS | Status: DC | PRN
  Administered 2018-01-06: 125 ug/kg/min via INTRAVENOUS

## 2018-01-06 MED ORDER — LIDOCAINE HCL (PF) 2 % IJ SOLN
INTRAMUSCULAR | Status: AC
  Filled 2018-01-06: qty 10

## 2018-01-06 NOTE — Anesthesia Post-op Follow-up Note (Signed)
Anesthesia QCDR form completed.        

## 2018-01-06 NOTE — Transfer of Care (Signed)
Immediate Anesthesia Transfer of Care Note  Patient: Shelby Colon  Procedure(s) Performed: COLONOSCOPY WITH PROPOFOL (N/A )  Patient Location: PACU and Endoscopy Unit  Anesthesia Type:General  Level of Consciousness: awake, alert  and oriented  Airway & Oxygen Therapy: Patient Spontanous Breathing  Post-op Assessment: Report given to RN and Post -op Vital signs reviewed and stable  Post vital signs: Reviewed and stable  Last Vitals:  Vitals Value Taken Time  BP 121/75 01/06/2018 11:07 AM  Temp    Pulse 84 01/06/2018 11:07 AM  Resp 20 01/06/2018 11:07 AM  SpO2 100 % 01/06/2018 11:07 AM  Vitals shown include unvalidated device data.  Last Pain:  Vitals:   01/06/18 0948  TempSrc: Tympanic  PainSc: 5          Complications: No apparent anesthesia complications

## 2018-01-06 NOTE — H&P (Signed)
Shelby Antigua, MD [address] [place], [place], [place], [address] [address], [place], [place], [place], [address] Phone: [phone]  Fax: [phone]  Primary Care Physician:  McLean-Scocuzza, Nino Glow, MD   Pre-Procedure History & Physical: HPI:  Shelby Colon is a 52 y.o. female is here for a colonoscopy.   Past Medical History:  Diagnosis Date  . Anxiety   . B12 deficiency   . Chronic pain syndrome   . Congenital absence of right kidney   . Degeneration of lumbar intervertebral disc   . Depression   . Early menopause   . Gastric bypass status for obesity    2013, weight 258lb  . Headache   . Heart murmur    MVP  . History of chicken pox   . Hypercholesterolemia   . Hypertension   . Iron deficiency   . Neuropathy   . Rheumatoid arthritis ([place])    as child   . Scoliosis of lumbar spine   . Solitary kidney, congenital    pt thinks has left kidney   . Thyroid disease    in the past   . UTI (urinary tract infection)   . Vitamin D deficiency     Past Surgical History:  Procedure Laterality Date  . AUGMENTATION MAMMAPLASTY Bilateral 2009  . BACK SURGERY  11/04/2014   fusion of spine of lumbosacral region 2 rods and 16 screws   . BREAST ENHANCEMENT SURGERY    . excess skin removal    . KNEE ARTHROSCOPY Bilateral    x 7 knee total 1 bone graft and 5 arthroscopy total knee   . ROUX-EN-Y PROCEDURE      Prior to Admission medications   Medication Sig Start Date End Date Taking? Authorizing Provider  ALPRAZolam Duanne Moron) 0.25 MG tablet 1 pill 2x per day as needed or 1-2 at night for sleep 11/30/17  Yes McLean-Scocuzza, Nino Glow, MD  gabapentin (NEURONTIN) 600 MG tablet Take 1 tablet (600 mg total) by mouth 3 (three) times daily. 12/20/17 01/19/18 Yes McLean-Scocuzza, Nino Glow, MD  buPROPion (WELLBUTRIN XL) 150 MG 24 hr tablet Take 1 tablet (150 mg total) by mouth daily. 11/30/17   McLean-Scocuzza, Nino Glow, MD  Cholecalciferol (VITAMIN D3) 5000 units CAPS Take  by mouth.    [provider]  cyclobenzaprine (FLEXERIL) 10 MG tablet Take 1 tablet (10 mg total) by mouth 3 (three) times daily as needed. 12/20/17   McLean-Scocuzza, Nino Glow, MD  Multiple Vitamin (MULTIVITAMIN) capsule Take by mouth.    [provider]  NON FORMULARY Liquid vitamin Miracle 2000    [provider]  NONFORMULARY OR COMPOUNDED ITEM COMPOUNDED MEDICATION  Baclofen 2% Cyclobenzaprine 2% Diclofenac 3% Lidocaine 3%  4 times a day as needed for knee    [provider]  UNABLE TO FIND Med Name: B12 W/P2P    [provider]    Allergies as of 12/09/2017 - Review Complete 11/30/2017  Allergen Reaction Noted  . Morphine Nausea And Vomiting 05/14/2014  . Oxycodone-acetaminophen Nausea Only 11/30/2017    Family History  Adopted: Yes  Problem Relation Age of Onset  . Arthritis Mother   . Drug abuse Mother        heriod OD  . Early death Mother   . Diabetes Father   . Hypertension Father   . Depression Father   . Arthritis Father   . Drug abuse Father   . Diabetes Sister   . Fibromyalgia Sister   . Rashes /  Skin problems Sister        PSORIASIS  . Diabetes Brother   . Osteoarthritis Maternal Grandmother   . Aneurysm Maternal Aunt   . Thyroid disease Maternal Aunt   . Fibromyalgia Maternal Aunt   . Breast cancer Maternal Aunt   . Cancer Maternal Aunt        breast cancer     Social History   Socioeconomic History  . Marital status: Married    Spouse name: Not on file  . Number of children: Not on file  . Years of education: Not on file  . Highest education level: Not on file  Occupational History  . Not on file  Social Needs  . Financial resource strain: Not on file  . Food insecurity:    Worry: Not on file    Inability: Not on file  . Transportation needs:    Medical: Not on file    Non-medical: Not on file  Tobacco Use  . Smoking status: Never Smoker  . Smokeless tobacco: Never Used  Substance and Sexual  Activity  . Alcohol use: Yes    Frequency: Never  . Drug use: No  . Sexual activity: Yes  Lifestyle  . Physical activity:    Days per week: Not on file    Minutes per session: Not on file  . Stress: Not on file  Relationships  . Social connections:    Talks on phone: Not on file    Gets together: Not on file    Attends religious service: Not on file    Active member of club or organization: Not on file    Attends meetings of clubs or organizations: Not on file    Relationship status: Not on file  . Intimate partner violence:    Fear of current or ex partner: Not on file    Emotionally abused: Not on file    Physically abused: Not on file    Forced sexual activity: Not on file  Other Topics Concern  . 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 Secretary/administrator    BA degree    No guns, wears seat belt, safe in relationship     Review of Systems: See HPI, otherwise negative ROS  Physical Exam: There were no vitals taken for this visit. General:   Alert,  pleasant and cooperative in NAD Head:  Normocephalic and atraumatic. Neck:  Supple; no masses or thyromegaly. Lungs:  Clear throughout to auscultation, normal respiratory effort.    Heart:  +S1, +S2, Regular rate and rhythm, No edema. Abdomen:  Soft, nontender and nondistended. Normal bowel sounds, without guarding, and without rebound.   Neurologic:  Alert and  oriented x4;  grossly normal neurologically.  Impression/Plan: Triana Coover is here for a colonoscopy to be performed for average risk screening.  Risks, benefits, limitations, and alternatives regarding  colonoscopy have been reviewed with the patient.  Questions have been answered.  All parties agreeable.   Virgel Manifold, MD  01/06/2018, 9:56 AM

## 2018-01-06 NOTE — Op Note (Addendum)
[facility] Patient Name: Shelby Colon Procedure Date: 01/06/2018 10:20 AM MRN: [id number] Account #: [id number] Date of Birth: May 01, 1966 Admit Type: Outpatient Age: 52 Room: [facility] ENDO ROOM 3 Gender: Female Note Status: Finalized Procedure:            Colonoscopy Indications:          Positive Cologuard test Providers:            Jamiaya Bina B. Bonna Gains MD, MD Referring MD:         Nino Glow Mclean-Scocuzza MD, MD (Referring MD) Medicines:            Monitored Anesthesia Care Complications:        No immediate complications. Procedure:            Pre-Anesthesia Assessment:                       - ASA Grade Assessment: II - A patient with mild                        systemic disease.                       - Prior to the procedure, a History and Physical was                        performed, and patient medications, allergies and                        sensitivities were reviewed. The patient's tolerance of                        previous anesthesia was reviewed.                       - The risks and benefits of the procedure and the                        sedation options and risks were discussed with the                        patient. All questions were answered and informed                        consent was obtained.                       - Patient identification and proposed procedure were                        verified prior to the procedure by the physician, the                        nurse, the anesthesiologist, the anesthetist and the                        technician. The procedure was verified in the procedure                        room.  After obtaining informed consent, the colonoscope was                        passed under direct vision. Throughout the procedure,                        the patient's blood pressure, pulse, and oxygen                        saturations were monitored continuously. The             Colonoscope was introduced through the anus and                        advanced to the the cecum, identified by appendiceal                        orifice and ileocecal valve. The colonoscopy was                        performed with ease. The patient tolerated the                        procedure well. The quality of the bowel preparation                        was fair except the cecum was poor. Findings:      The perianal and digital rectal examinations were normal.      A 6 mm polyp was found in the ascending colon. The polyp was       pedunculated. The polyp was removed with a hot snare. Resection and       retrieval were complete. To prevent bleeding after the polypectomy, one       hemostatic clip was successfully placed. There was no bleeding at the       end of the procedure.      The exam was otherwise without abnormality.      The rectum, sigmoid colon, descending colon, transverse colon, ascending       colon and cecum appeared normal.      The retroflexed view of the distal rectum and anal verge was normal and       showed no anal or rectal abnormalities. Impression:           - One 6 mm polyp in the ascending colon, removed with a                        hot snare. Resected and retrieved. Clip was placed.                       - The examination was otherwise normal.                       - The rectum, sigmoid colon, descending colon,                        transverse colon, ascending colon and cecum are normal.                       - The distal rectum and anal verge are normal on  retroflexion view. Recommendation:       - Discharge patient to home (with escort).                       - Advance diet as tolerated.                       - Continue present medications.                       - Await pathology results.                       - Repeat colonoscopy 6-12 months due to poor to fair                        prep for surveillance.                        - The findings and recommendations were discussed with                        the patient.                       - The findings and recommendations were discussed with                        the patient's family.                       - Return to primary care physician as previously                        scheduled. Procedure Code(s):    --- Professional ---                       [phone], Colonoscopy, flexible; with removal of tumor(s),                        polyp(s), or other lesion(s) by snare technique Diagnosis Code(s):    --- Professional ---                       D12.2, Benign neoplasm of ascending colon                       R19.5, Other fecal abnormalities CPT copyright 2017 [employer]. All rights reserved. The codes documented in this report are preliminary and upon coder review may  be revised to meet current compliance requirements.  Vonda Antigua, MD Margretta Sidle B. Bonna Gains MD, MD 01/06/2018 11:10:53 AM This report has been signed electronically. Number of Addenda: 0 Note Initiated On: 01/06/2018 10:20 AM Scope Withdrawal Time: 0 hours 23 minutes 16 seconds  Total Procedure Duration: 0 hours 36 minutes 26 seconds  Estimated Blood Loss: Estimated blood loss: none.      [facility]

## 2018-01-06 NOTE — Anesthesia Preprocedure Evaluation (Signed)
Anesthesia Evaluation  Patient identified by MRN, date of birth, ID band Patient awake    Reviewed: Allergy & Precautions, NPO status , Patient's Chart, lab work & pertinent test results, reviewed documented beta blocker date and time   Airway Mallampati: II  TM Distance: >3 FB     Dental  (+) Chipped   Pulmonary           Cardiovascular hypertension, Pt. on medications + Valvular Problems/Murmurs      Neuro/Psych  Headaches, PSYCHIATRIC DISORDERS Anxiety Depression    GI/Hepatic   Endo/Other    Renal/GU Renal disease     Musculoskeletal  (+) Arthritis , Rheumatoid disorders,    Abdominal   Peds  Hematology   Anesthesia Other Findings MVP. Gastriv bypass.  Reproductive/Obstetrics                             Anesthesia Physical Anesthesia Plan  ASA: III  Anesthesia Plan: General   Post-op Pain Management:    Induction: Intravenous  PONV Risk Score and Plan:   Airway Management Planned:   Additional Equipment:   Intra-op Plan:   Post-operative Plan:   Informed Consent: I have reviewed the patients History and Physical, chart, labs and discussed the procedure including the risks, benefits and alternatives for the proposed anesthesia with the patient or authorized representative who has indicated his/her understanding and acceptance.     Plan Discussed with: CRNA  Anesthesia Plan Comments:         Anesthesia Quick Evaluation

## 2018-01-06 NOTE — Anesthesia Postprocedure Evaluation (Signed)
Anesthesia Post Note  Patient: Shelby Colon  Procedure(s) Performed: COLONOSCOPY WITH PROPOFOL (N/A )  Patient location during evaluation: Endoscopy Anesthesia Type: General Level of consciousness: awake and alert Pain management: pain level controlled Vital Signs Assessment: post-procedure vital signs reviewed and stable Respiratory status: spontaneous breathing, nonlabored ventilation, respiratory function stable and patient connected to nasal cannula oxygen Cardiovascular status: blood pressure returned to baseline and stable Postop Assessment: no apparent nausea or vomiting Anesthetic complications: no     Last Vitals:  Vitals:   01/06/18 1127 01/06/18 1137  BP: 139/89 (!) 137/94  Pulse: 78 72  Resp: 16 16  Temp:    SpO2: 100% 100%    Last Pain:  Vitals:   01/06/18 1137  TempSrc:   PainSc: 0-No pain                 Micah Galeno S

## 2018-01-09 ENCOUNTER — Encounter: Payer: Self-pay | Admitting: Gastroenterology

## 2018-01-09 LAB — SURGICAL PATHOLOGY

## 2018-01-10 ENCOUNTER — Inpatient Hospital Stay
Admission: RE | Admit: 2018-01-10 | Discharge: 2018-01-10 | Disposition: A | Discharge status: Home / Self Care | Payer: Self-pay | Source: Ambulatory Visit | Attending: *Deleted | Admitting: *Deleted

## 2018-01-10 ENCOUNTER — Other Ambulatory Visit: Payer: Self-pay | Admitting: *Deleted

## 2018-01-10 DIAGNOSIS — Z9289 Personal history of other medical treatment: Secondary | ICD-10-CM

## 2018-01-17 DIAGNOSIS — M545 Low back pain: Secondary | ICD-10-CM | POA: Diagnosis not present

## 2018-01-17 DIAGNOSIS — M25561 Pain in right knee: Secondary | ICD-10-CM | POA: Diagnosis not present

## 2018-01-17 DIAGNOSIS — Z96651 Presence of right artificial knee joint: Secondary | ICD-10-CM | POA: Diagnosis not present

## 2018-01-24 ENCOUNTER — Telehealth: Payer: Self-pay | Admitting: Obstetrics and Gynecology

## 2018-01-24 NOTE — Telephone Encounter (Signed)
Dr. Georgianne Fick HPV + s/p LEEP 04/08/17 with LSIL due pap 04/08/18

## 2018-01-24 NOTE — Telephone Encounter (Signed)
[facility] referring HPV in female,Low grade squamous intraepithelial lesion on cytologic smear of cervix (LGSIL). AMS to be the provider. Called and left voicemail for patient to call back to be  schedule

## 2018-01-25 NOTE — Telephone Encounter (Signed)
Called and left voice mail for patient to call back to be schedule °

## 2018-02-01 NOTE — Telephone Encounter (Signed)
Called and left voice mail for patient to call back to be schedule °

## 2018-02-01 NOTE — Telephone Encounter (Signed)
Letter to be sent to patient.

## 2018-02-05 ENCOUNTER — Other Ambulatory Visit: Payer: Self-pay | Admitting: Internal Medicine

## 2018-02-05 DIAGNOSIS — M549 Dorsalgia, unspecified: Principal | ICD-10-CM

## 2018-02-05 DIAGNOSIS — G8929 Other chronic pain: Secondary | ICD-10-CM

## 2018-02-06 ENCOUNTER — Other Ambulatory Visit: Payer: Self-pay | Admitting: Internal Medicine

## 2018-02-06 DIAGNOSIS — G8929 Other chronic pain: Secondary | ICD-10-CM

## 2018-02-06 DIAGNOSIS — M549 Dorsalgia, unspecified: Principal | ICD-10-CM

## 2018-02-06 MED ORDER — GABAPENTIN 600 MG PO TABS: 600.0000 mg | ORAL_TABLET | Freq: Three times a day (TID) | ORAL | 2 refills | Status: DC

## 2018-02-06 MED ORDER — CYCLOBENZAPRINE HCL 10 MG PO TABS: 10.0000 mg | ORAL_TABLET | Freq: Three times a day (TID) | ORAL | 2 refills | Status: DC | PRN

## 2018-02-13 ENCOUNTER — Encounter: Payer: Self-pay | Admitting: Internal Medicine

## 2018-02-13 ENCOUNTER — Ambulatory Visit (INDEPENDENT_AMBULATORY_CARE_PROVIDER_SITE_OTHER): Payer: 59 | Admitting: Internal Medicine

## 2018-02-13 VITALS — BP 140/96 | HR 104 | Temp 98.8°F | Ht 62.0 in | Wt 148.6 lb

## 2018-02-13 DIAGNOSIS — D75839 Thrombocytosis, unspecified: Secondary | ICD-10-CM

## 2018-02-13 DIAGNOSIS — Z0184 Encounter for antibody response examination: Secondary | ICD-10-CM

## 2018-02-13 DIAGNOSIS — I10 Essential (primary) hypertension: Secondary | ICD-10-CM

## 2018-02-13 DIAGNOSIS — M2550 Pain in unspecified joint: Secondary | ICD-10-CM

## 2018-02-13 DIAGNOSIS — E559 Vitamin D deficiency, unspecified: Secondary | ICD-10-CM | POA: Diagnosis not present

## 2018-02-13 DIAGNOSIS — R011 Cardiac murmur, unspecified: Secondary | ICD-10-CM

## 2018-02-13 DIAGNOSIS — G8929 Other chronic pain: Secondary | ICD-10-CM

## 2018-02-13 DIAGNOSIS — Z8679 Personal history of other diseases of the circulatory system: Secondary | ICD-10-CM

## 2018-02-13 DIAGNOSIS — G629 Polyneuropathy, unspecified: Secondary | ICD-10-CM

## 2018-02-13 DIAGNOSIS — Z1329 Encounter for screening for other suspected endocrine disorder: Secondary | ICD-10-CM

## 2018-02-13 DIAGNOSIS — I341 Nonrheumatic mitral (valve) prolapse: Secondary | ICD-10-CM

## 2018-02-13 DIAGNOSIS — F419 Anxiety disorder, unspecified: Secondary | ICD-10-CM

## 2018-02-13 DIAGNOSIS — R748 Abnormal levels of other serum enzymes: Secondary | ICD-10-CM | POA: Diagnosis not present

## 2018-02-13 DIAGNOSIS — Z1231 Encounter for screening mammogram for malignant neoplasm of breast: Secondary | ICD-10-CM

## 2018-02-13 DIAGNOSIS — M549 Dorsalgia, unspecified: Secondary | ICD-10-CM

## 2018-02-13 DIAGNOSIS — R739 Hyperglycemia, unspecified: Secondary | ICD-10-CM | POA: Diagnosis not present

## 2018-02-13 DIAGNOSIS — Z1389 Encounter for screening for other disorder: Secondary | ICD-10-CM

## 2018-02-13 DIAGNOSIS — R8290 Unspecified abnormal findings in urine: Secondary | ICD-10-CM

## 2018-02-13 DIAGNOSIS — F32A Depression, unspecified: Secondary | ICD-10-CM

## 2018-02-13 DIAGNOSIS — Z1159 Encounter for screening for other viral diseases: Secondary | ICD-10-CM | POA: Diagnosis not present

## 2018-02-13 DIAGNOSIS — E538 Deficiency of other specified B group vitamins: Secondary | ICD-10-CM

## 2018-02-13 DIAGNOSIS — D473 Essential (hemorrhagic) thrombocythemia: Secondary | ICD-10-CM

## 2018-02-13 DIAGNOSIS — F329 Major depressive disorder, single episode, unspecified: Secondary | ICD-10-CM

## 2018-02-13 DIAGNOSIS — G4700 Insomnia, unspecified: Secondary | ICD-10-CM

## 2018-02-13 MED ORDER — METOPROLOL SUCCINATE ER 25 MG PO TB24: 25.0000 mg | ORAL_TABLET | Freq: Every day | ORAL | 3 refills | Status: DC

## 2018-02-13 MED ORDER — GABAPENTIN 600 MG PO TABS: ORAL_TABLET | ORAL | 5 refills | Status: DC

## 2018-02-13 NOTE — Progress Notes (Addendum)
Chief Complaint  Patient presents with  . Follow-up   F/u  1. BP elevated this is chronic problems for pt not on meds FH HTN and DM  2. S/p gastric surgery for weight loss  3. Mood better on Xanax 0.25 1-2 qhs which she takes for sleep, Wellbutrin XL 150 mg qd PHQ 9 score 3 and GAD 7 6. Having job with [employer] has improved mood  4. She reports h/o Afib In the past in [place] this was noted on prior echo after a knee surgery she had per pt  5. H/o colon polyps noted on colonoscopy 01/06/18 tubular x 1 rec f/u in 6 months to 1 year due to poor prep  6. She reports feeling presyncope yesterday 7. +antiCCP, cRP rheumatology appt Dr. Amil Amen 02/2018    Review of Systems  Constitutional: Negative for weight loss.  HENT: Negative for hearing loss.   Eyes: Negative for blurred vision.  Respiratory: Negative for shortness of breath.   Gastrointestinal: Negative for abdominal pain.  Musculoskeletal: Positive for joint pain. Negative for falls.  Skin: Negative for rash.  Neurological: Negative for loss of consciousness and headaches.       +presyncope yesterday  Psychiatric/Behavioral: Negative for depression. The patient has insomnia. The patient is not nervous/anxious.    Past Medical History:  Diagnosis Date  . Anxiety   . B12 deficiency   . Chronic pain syndrome   . Congenital absence of right kidney   . Degeneration of lumbar intervertebral disc   . Depression   . Early menopause   . Gastric bypass status for obesity    2013, weight 258lb  . Headache   . Heart murmur    MVP  . History of chicken pox   . Hypercholesterolemia   . Hypertension   . Iron deficiency   . Neuropathy   . Rheumatoid arthritis ([place])    as child   . Scoliosis of lumbar spine   . Solitary kidney, congenital    pt thinks has left kidney   . Thyroid disease    in the past   . UTI (urinary tract infection)   . Vitamin D deficiency    Past Surgical History:  Procedure Laterality Date  . AUGMENTATION  MAMMAPLASTY Bilateral 2009  . BACK SURGERY  11/04/2014   fusion of spine of lumbosacral region 2 rods and 16 screws   . BREAST ENHANCEMENT SURGERY    . COLONOSCOPY WITH PROPOFOL N/A 01/06/2018   Procedure: COLONOSCOPY WITH PROPOFOL;  Surgeon: Virgel Manifold, MD;  Location: [facility] ENDOSCOPY;  Service: Endoscopy;  Laterality: N/A;  . excess skin removal    . KNEE ARTHROSCOPY Bilateral    x 7 knee total 1 bone graft and 5 arthroscopy total knee   . ROUX-EN-Y PROCEDURE     Family History  Adopted: Yes  Problem Relation Age of Onset  . Arthritis Mother   . Drug abuse Mother        heriod OD  . Early death Mother   . Diabetes Father   . Hypertension Father   . Depression Father   . Arthritis Father   . Drug abuse Father   . Diabetes Sister   . Fibromyalgia Sister   . Rashes / Skin problems Sister        PSORIASIS  . Diabetes Brother   . Osteoarthritis Maternal Grandmother   . Aneurysm Maternal Aunt   . Thyroid disease Maternal Aunt   . Fibromyalgia Maternal Aunt   .  Breast cancer Maternal Aunt   . Cancer Maternal Aunt        breast cancer    Social History   Socioeconomic History  . Marital status: Married    Spouse name: Not on file  . Number of children: Not on file  . Years of education: Not on file  . Highest education level: Not on file  Occupational History  . Not on file  Social Needs  . Financial resource strain: Not on file  . Food insecurity:    Worry: Not on file    Inability: Not on file  . Transportation needs:    Medical: Not on file    Non-medical: Not on file  Tobacco Use  . Smoking status: Never Smoker  . Smokeless tobacco: Never Used  Substance and Sexual Activity  . Alcohol use: Yes    Frequency: Never  . Drug use: No  . Sexual activity: Yes  Lifestyle  . Physical activity:    Days per week: Not on file    Minutes per session: Not on file  . Stress: Not on file  Relationships  . Social connections:    Talks on phone: Not on file     Gets together: Not on file    Attends religious service: Not on file    Active member of club or organization: Not on file    Attends meetings of clubs or organizations: Not on file    Relationship status: Not on file  . Intimate partner violence:    Fear of current or ex partner: Not on file    Emotionally abused: Not on file    Physically abused: Not on file    Forced sexual activity: Not on file  Other Topics Concern  . 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 Secretary/administrator    BA degree    No guns, wears seat belt, safe in relationship    Current Meds  Medication Sig  . ALPRAZolam (XANAX) 0.25 MG tablet 1 pill 2x per day as needed or 1-2 at night for sleep  . buPROPion (WELLBUTRIN XL) 150 MG 24 hr tablet Take 1 tablet (150 mg total) by mouth daily.  . Cholecalciferol (VITAMIN D3) 5000 units CAPS Take by mouth.  . cyclobenzaprine (FLEXERIL) 10 MG tablet Take 1 tablet (10 mg total) by mouth 3 (three) times daily as needed.  . gabapentin (NEURONTIN) 600 MG tablet Take 1 tablet (600 mg total) by mouth 3 (three) times daily.  . Multiple Vitamin (MULTIVITAMIN) capsule Take by mouth.  . NON FORMULARY Liquid vitamin Miracle 2000  . NONFORMULARY OR COMPOUNDED ITEM COMPOUNDED MEDICATION  Baclofen 2% Cyclobenzaprine 2% Diclofenac 3% Lidocaine 3%  4 times a day as needed for knee  . UNABLE TO FIND Med Name: B12 W/P2P   Allergies  Allergen Reactions  . Morphine Nausea And Vomiting    Other reaction(s): Other (See Comments)  . Oxycodone-Acetaminophen Nausea Only   Recent Results (from the past 2160 hour(s))  Comprehensive metabolic panel     Status: Abnormal   Collection Time: 12/05/17 10:44 AM  Result Value Ref Range   Glucose, Bld 111 (H) 65 - 99 mg/dL    Comment: .            Fasting reference interval . For someone  without  known diabetes, a glucose value between 100 and 125 mg/dL is consistent with prediabetes and should be confirmed with a follow-up test. .    BUN 9 7 - 25 mg/dL   Creat 0.74 0.50 - 1.05 mg/dL    Comment: For patients >15 years of age, the reference limit for Creatinine is approximately 13% higher for people identified as African-American. .    BUN/Creatinine Ratio NOT APPLICABLE 6 - 22 (calc)   Sodium 141 135 - 146 mmol/L   Potassium 4.1 3.5 - 5.3 mmol/L   Chloride 104 98 - 110 mmol/L   CO2 22 20 - 32 mmol/L   Calcium 9.8 8.6 - 10.4 mg/dL   Total Protein 7.2 6.1 - 8.1 g/dL   Albumin 4.5 3.6 - 5.1 g/dL   Globulin 2.7 1.9 - 3.7 g/dL (calc)   AG Ratio 1.7 1.0 - 2.5 (calc)   Total Bilirubin 0.3 0.2 - 1.2 mg/dL   Alkaline phosphatase (APISO) 152 (H) 33 - 130 U/L   AST 35 10 - 35 U/L   ALT 37 (H) 6 - 29 U/L  CBC with Differential/Platelet     Status: Abnormal   Collection Time: 12/05/17 10:44 AM  Result Value Ref Range   WBC 8.1 3.8 - 10.8 Thousand/uL   RBC 4.44 3.80 - 5.10 Million/uL   Hemoglobin 12.1 11.7 - 15.5 g/dL   HCT 36.7 35.0 - 45.0 %   MCV 82.7 80.0 - 100.0 fL   MCH 27.3 27.0 - 33.0 pg   MCHC 33.0 32.0 - 36.0 g/dL   RDW 12.5 11.0 - 15.0 %   Platelets 507 (H) 140 - 400 Thousand/uL   MPV 10.7 7.5 - 12.5 fL   Neutro Abs 5,945 1,500 - 7,800 cells/uL   Lymphs Abs 1,580 850 - 3,900 cells/uL   WBC mixed population 389 200 - 950 cells/uL   Eosinophils Absolute 113 15 - 500 cells/uL   Basophils Absolute 73 0 - 200 cells/uL   Neutrophils Relative % 73.4 %   Total Lymphocyte 19.5 %   Monocytes Relative 4.8 %   Eosinophils Relative 1.4 %   Basophils Relative 0.9 %  Lipid panel     Status: None   Collection Time: 12/05/17 10:44 AM  Result Value Ref Range   Cholesterol 196 <200 mg/dL   HDL 108 >50 mg/dL   Triglycerides 62 <150 mg/dL   LDL Cholesterol (Calc) 74 mg/dL (calc)    Comment: Reference range: <100 . Desirable range <100 mg/dL for primary prevention;   <70  mg/dL for patients with CHD or diabetic patients  with > or = 2 CHD risk factors. Marland Kitchen LDL-C is now calculated using the Martin-Hopkins  calculation, which is a validated novel method providing  better accuracy than the Friedewald equation in the  estimation of LDL-C.  Cresenciano Genre et al. Annamaria Helling. 7106;269(48): 2061-2068  (http://education.[facility].com/faq/FAQ164)    Total CHOL/HDL Ratio 1.8 <5.0 (calc)   Non-HDL Cholesterol (Calc) 88 <130 mg/dL (calc)    Comment: For patients with diabetes plus 1 major ASCVD risk  factor, treating to a non-HDL-C goal of <100 mg/dL  (LDL-C of <70 mg/dL) is considered a therapeutic  option.   Sedimentation rate     Status: Abnormal   Collection Time: 12/05/17 10:44 AM  Result Value Ref Range   Sed Rate 25 (H) 0 - 20 mm/h  C-reactive protein     Status: Abnormal   Collection Time: 12/05/17 10:44 AM  Result Value Ref Range  CRP 9.8 (H) <8.2 mg/L  Cyclic citrul peptide antibody, IgG     Status: Abnormal   Collection Time: 12/05/17 10:44 AM  Result Value Ref Range   Cyclic Citrullin Peptide Ab 114 (H) UNITS    Comment: Reference Range Negative:            <20 Weak Positive:       20-39 Moderate Positive:   40-59 Strong Positive:     >59 .   Antinuclear Antib (ANA)     Status: None   Collection Time: 12/05/17 10:44 AM  Result Value Ref Range   Anti Nuclear Antibody(ANA) NEGATIVE NEGATIVE    Comment: ANA IFA is a first line screen for detecting the presence of up to approximately 150 autoantibodies in various autoimmune diseases. A negative ANA IFA result suggests ANA-associated autoimmune diseases are not present at this time. . Visit Physician FAQs for interpretation of all antibodies in the [place], prevalence, and association with diseases at http://education.[facility].com/ LMB/EML544 .   Rheumatoid Factor     Status: None   Collection Time: 12/05/17 10:44 AM  Result Value Ref Range   Rhuematoid fact SerPl-aCnc <14 <14 IU/mL   Iron, TIBC and Ferritin Panel     Status: None   Collection Time: 12/05/17 10:44 AM  Result Value Ref Range   Iron 110 45 - 160 mcg/dL   TIBC 351 250 - 450 mcg/dL (calc)   %SAT 31 16 - 45 % (calc)   Ferritin 34 16 - 232 ng/mL  Urinalysis, Routine w reflex microscopic     Status: Abnormal   Collection Time: 12/05/17 10:44 AM  Result Value Ref Range   Specific Gravity, UA 1.014 1.005 - 1.030   pH, UA 6.0 5.0 - 7.5   Color, UA Yellow Yellow   Appearance Ur Clear Clear   Leukocytes, UA 2+ (A) Negative   Protein, UA Negative Negative/Trace   Glucose, UA Negative Negative   Ketones, UA Negative Negative   RBC, UA Negative Negative   Bilirubin, UA Negative Negative   Urobilinogen, Ur 0.2 0.2 - 1.0 mg/dL   Nitrite, UA Negative Negative   Microscopic Examination See below:     Comment: Microscopic was indicated and was performed.  Microscopic Examination     Status: Abnormal   Collection Time: 12/05/17 10:44 AM  Result Value Ref Range   WBC, UA 11-30 (A) 0 - 5 /hpf   RBC, UA 0-2 0 - 2 /hpf   Epithelial Cells (non renal) 0-10 0 - 10 /hpf   Casts None seen None seen /lpf   Crystals Present (A) N/A   Crystal Type Amorphous Sediment N/A   Mucus, UA Present Not Estab.   Bacteria, UA Moderate (A) None seen/Few  Pregnancy, urine POC     Status: None   Collection Time: 01/06/18 10:02 AM  Result Value Ref Range   Preg Test, Ur NEGATIVE NEGATIVE    Comment:        THE SENSITIVITY OF THIS METHODOLOGY IS >24 mIU/mL   Surgical pathology     Status: None   Collection Time: 01/06/18 10:45 AM  Result Value Ref Range   SURGICAL PATHOLOGY      Surgical Pathology CASE: [id number] PATIENT: Jasiah Denicola Surgical Pathology Report     SPECIMEN SUBMITTED: A. Colon polyp, ascending;hot snare  CLINICAL HISTORY: None provided  PRE-OPERATIVE DIAGNOSIS: Positive Cologuard  POST-OPERATIVE DIAGNOSIS: Colon polyp     DIAGNOSIS: A. COLON POLYP, ASCENDING; HOT SNARE: - TUBULAR  ADENOMA WITH THERMAL  ARTIFACT. - NEGATIVE FOR HIGH-GRADE DYSPLASIA AND MALIGNANCY.   GROSS DESCRIPTION: A. Labeled: Hot snare polyp ascending colon Received: In formalin Tissue fragment(s): 1 Size: 0.5 cm Description: Pink polypoid fragment, marked blue at what appears to be the base Entirely submitted in one cassette.    Final Diagnosis performed by Quay Burow, MD.   Electronically signed 01/09/2018 4:20:40PM The electronic signature indicates that the named Attending Pathologist has evaluated the specimen  Technical component performed at [facility], [address] [place], [place] [address] Lab: [phone] Dir: Rich Reining, MD, MMM  Professional component performed at [facility], [facility], [place], [place], [place] [address] Lab: [phone] Dir: Dellia Nims. Rubinas, MD    Objective  Body mass index is 27.18 kg/m. Wt Readings from Last 3 Encounters:  02/13/18 148 lb 9.6 oz (67.4 kg)  01/06/18 145 lb (65.8 kg)  11/30/17 148 lb 3.2 oz (67.2 kg)   Temp Readings from Last 3 Encounters:  02/13/18 98.8 F (37.1 C) (Oral)  01/06/18 (!) 97.1 F (36.2 C) (Tympanic)  11/30/17 98.6 F (37 C) (Oral)   BP Readings from Last 3 Encounters:  02/13/18 (!) 140/96  01/06/18 (!) 137/94  11/30/17 (!) 142/94   Pulse Readings from Last 3 Encounters:  02/13/18 (!) 104  01/06/18 72  11/30/17 92    Physical Exam  Constitutional: She is oriented to person, place, and time. Vital signs are normal. She appears well-developed and well-nourished. She is cooperative.  HENT:  Head: Normocephalic and atraumatic.  Mouth/Throat: Oropharynx is clear and moist and mucous membranes are normal.  Eyes: Pupils are equal, round, and reactive to light. Conjunctivae are normal.  Cardiovascular: Normal rate and regular rhythm.  Murmur heard. Pulmonary/Chest: Effort normal and breath sounds normal.  Neurological: She is alert and oriented to person, place, and time. Gait  normal.  Skin: Skin is warm, dry and intact.  Psychiatric: She has a normal mood and affect. Her speech is normal and behavior is normal. Judgment and thought content normal. Cognition and memory are normal.  Nursing note and vitals reviewed.   Assessment   1. HTN  2. Depression, anxiety, insomnia improved  3. H/o Afib CHADSVASC2 score 2 moderate to high risk, cardiac murmur h/o MVP 4. Presyncope  5. Elevated lfts  6. Anti CCP and CRP pending rheumatology consult  7. HM 8. Neuropathy legs since 7 knee surgeries  Plan   1. Add toprol XL 25 mg qd  2. Cont meds for now  3. Consider cards in future   Provider: Dr. Olivia Mackie McLean-Scocuzza-Internal Medicine  4. Check labs today  tx HTN  Encouraged hydration  5. Repeat labs today 6. Pending rheumatology referral  7.  declines flu shot  Tdap had 02/01/17   LMP age 54 early menopause  H/o abnormal pap 03/21/17 HPV bx 04/08/17 HPV LSIL [facility] Comment  Comment: Material submitted:Marland Kitchen PART A: CERVIX, 4:00, BIOPSY PART B: CERVIX, 10:00, BIOPSY PART C: ENDOCERVIX, CURETTINGS    . Comment  Comment: Clinician provided ICD-10: R87.810   . Comment  Comment:  Diagnosis: Part A: CERVIX, 4:00, BIOPSY: KOILOCYTOSIS CONSISTENT WITH HUMAN PAPILLOMAVIRUS (HPV) EFFECT. NO ENDOCERVICAL TISSUE. NEGATIVE FOR HIGH GRADE DYSPLASIA AND MALIGNANCY Part B: CERVIX, 10:00, BIOPSY: LOW-GRADE SQUAMOUS INTRAEPITHELIAL LESION (CIN 1). CHRONIC CERVICITIS. TRANSFORMATION ZONE NOT REPRESENTED. NEGATIVE FOR HIGH GRADE DYSPLASIA AND MALIGNANCY Part C: ENDOCERVIX, CURETTINGS: MINUTE FRAGMENTS OF BENIGN ENDOCERVICAL GLANDS, MUCUS, AND BLOOD. TRANSFORMATION ZONE NOT REPRESENTED. NEGATIVE FOR DYSPLASIA, VIRAL EFFECT, AND MALIGNANCY. COMMENT: The patients  history of a positive hybrid capture DNA test for high-risk Human Papillomavirus serotypes is noted which warrants continued follow up. CER/04/08/2017    . Comment   Comment: Electronically signed: . Elwanda Brooklyn, MD, Pathologist   . Comment  Comment: Gross description: . 3 Containers, formalin-filled, labeled with patient identification. Part A: CERVIX, 4:00, BIOPSY: Received in formalin is .3 X .3 X .1 cm in aggregate of mucinous material.Tissue is submitted in toto in 1 cassette(s). Part B: CERVIX, 10:00, BIOPSY: Received in formalin is .2 X .2 X .1 cm in aggregate of mucinous and tan tissue fragments.Tissue is submitted in toto in 1 cassette(s). Part C: ENDOCERVIX, CURETTINGS: Received in formalin is .5 X .5 X .2 cm in aggregate of mucinous, tan and brown material.Tissue is submitted in toto in 1 cassette(s). /SBB /SBB   . Comment  Comment: Pathologist provided ICD-10: B97.7, N72, N87.0   . Comment  Comment: CPT. 591368, [id number], [id number]      -referred to OB/GYN Dr. Star Age  -re refer rec pt goes due to above  cologuard +04/14/17 - Referred to [facility] colonoscopy had 01/06/18 tubular adenoma poor prep rec f/u in 6-12 months  Mammogram referred for 3 D with implants had 01/03/18 negative   Vitamin D 32.2 02/01/17, B12 595 02/01/17 s/p gastric bypass lost 125 lbs  No need for dermatology now  TSH had 02/01/17 normal   8.  Increase gabapentin 600 mg 1 1 and 2 pills qhs  Check A1C, CMET, plts, UA today, B12 MMR, hep B, vit D  Saw rheum [place] [facility] 03/10/18 Dr.Hawkes uric acid 6.3, ESR 13, CRP 22 HLA B27 neg, HCV negative, Hep B core IGM neg, Hep BsAg neg, Hep A IGM neg  ANA negative  TB quantiferon neg   Orthopedic Dr. Tora Perches Eye MD [facility] in 2016 Dentist [facility]   Saw [facility]ology 05/10/18 +RA and OA on celebrex prn doing better Dr. Gavin Pound f/u in 4 months  [place]

## 2018-02-13 NOTE — Progress Notes (Signed)
Pre visit review using our clinic review tool, if applicable. No additional management support is needed unless otherwise documented below in the visit note. 

## 2018-02-13 NOTE — Patient Instructions (Addendum)
Results for Shelby Colon, Shelby Colon (MRN [id number]) as of 02/13/2018 16:16  Ref. Range 12/05/2017 10:44  Anti Nuclear Antibody(ANA) Latest Ref Range: NEGATIVE  NEGATIVE  Cyclic Citrullin Peptide Ab Latest Units: UNITS 114 (H)  RA Latex Turbid. Latest Ref Range: <14 IU/mL <14  Results for Shelby Colon, Shelby Colon (MRN [id number]) as of 02/13/2018 16:16  Ref. Range 12/05/2017 10:44  Sed Rate Latest Ref Range: 0 - 20 mm/h 25 (H)   CRP <8.0 mg/L 9.8High

## 2018-02-14 ENCOUNTER — Encounter: Payer: Self-pay | Admitting: Internal Medicine

## 2018-02-14 DIAGNOSIS — R748 Abnormal levels of other serum enzymes: Secondary | ICD-10-CM | POA: Insufficient documentation

## 2018-02-14 DIAGNOSIS — Z8679 Personal history of other diseases of the circulatory system: Secondary | ICD-10-CM | POA: Insufficient documentation

## 2018-02-14 DIAGNOSIS — M2550 Pain in unspecified joint: Secondary | ICD-10-CM | POA: Insufficient documentation

## 2018-02-14 DIAGNOSIS — R011 Cardiac murmur, unspecified: Secondary | ICD-10-CM | POA: Insufficient documentation

## 2018-02-14 DIAGNOSIS — G629 Polyneuropathy, unspecified: Secondary | ICD-10-CM | POA: Insufficient documentation

## 2018-02-14 HISTORY — DX: Cardiac murmur, unspecified: R01.1

## 2018-02-14 LAB — COMPREHENSIVE METABOLIC PANEL
ALT: 38 IU/L — AB (ref 0–32)
AST: 39 IU/L (ref 0–40)
Albumin/Globulin Ratio: 2 (ref 1.2–2.2)
Albumin: 4.6 g/dL (ref 3.5–5.5)
Alkaline Phosphatase: 139 IU/L — ABNORMAL HIGH (ref 39–117)
BUN/Creatinine Ratio: 12 (ref 9–23)
BUN: 10 mg/dL (ref 6–24)
Bilirubin Total: <0.2 mg/dL (ref 0.0–1.2)
CALCIUM: 9.7 mg/dL (ref 8.7–10.2)
CO2: 24 mmol/L (ref 20–29)
Chloride: 100 mmol/L (ref 96–106)
Creatinine, Ser: 0.86 mg/dL (ref 0.57–1.00)
GFR, EST AFRICAN AMERICAN: 90 mL/min/{1.73_m2} (ref >59)
GFR, EST NON AFRICAN AMERICAN: 78 mL/min/{1.73_m2} (ref >59)
GLUCOSE: 93 mg/dL (ref 65–99)
Globulin, Total: 2.3 g/dL (ref 1.5–4.5)
Potassium: 3.9 mmol/L (ref 3.5–5.2)
Sodium: 141 mmol/L (ref 134–144)
TOTAL PROTEIN: 6.9 g/dL (ref 6.0–8.5)

## 2018-02-14 LAB — URINALYSIS, ROUTINE W REFLEX MICROSCOPIC
Bilirubin, UA: NEGATIVE
Glucose, UA: NEGATIVE
KETONES UA: NEGATIVE
NITRITE UA: NEGATIVE
Protein, UA: NEGATIVE
RBC, UA: NEGATIVE
SPEC GRAV UA: 1.012 (ref 1.005–1.030)
Urobilinogen, Ur: 0.2 mg/dL (ref 0.2–1.0)
pH, UA: 6 (ref 5.0–7.5)

## 2018-02-14 LAB — MICROSCOPIC EXAMINATION: Casts: NONE SEEN /lpf

## 2018-02-14 LAB — HEPATITIS B SURFACE ANTIBODY, QUANTITATIVE: Hepatitis B Surf Ab Quant: <3.1 m[IU]/mL — ABNORMAL LOW (ref >9.9)

## 2018-02-14 LAB — CBC WITH DIFFERENTIAL/PLATELET
BASOS ABS: 0.1 10*3/uL (ref 0.0–0.2)
Basos: 1 %
EOS (ABSOLUTE): 0.2 10*3/uL (ref 0.0–0.4)
Eos: 2 %
Hematocrit: 36.8 % (ref 34.0–46.6)
Hemoglobin: 11.3 g/dL (ref 11.1–15.9)
IMMATURE GRANS (ABS): 0 10*3/uL (ref 0.0–0.1)
Immature Granulocytes: 0 %
LYMPHS: 33 %
Lymphocytes Absolute: 2.4 10*3/uL (ref 0.7–3.1)
MCH: 26.3 pg — AB (ref 26.6–33.0)
MCHC: 30.7 g/dL — AB (ref 31.5–35.7)
MCV: 86 fL (ref 79–97)
MONOS ABS: 0.5 10*3/uL (ref 0.1–0.9)
Monocytes: 6 %
NEUTROS ABS: 4.2 10*3/uL (ref 1.4–7.0)
NEUTROS PCT: 58 %
PLATELETS: 500 10*3/uL — AB (ref 150–450)
RBC: 4.3 x10E6/uL (ref 3.77–5.28)
RDW: 13.2 % (ref 12.3–15.4)
WBC: 7.3 10*3/uL (ref 3.4–10.8)

## 2018-02-14 LAB — MEASLES/MUMPS/RUBELLA IMMUNITY
MUMPS ABS, IGG: 228 AU/mL (ref >10.9)
RUBEOLA AB, IGG: 68.6 [AU]/ml (ref >16.4)
Rubella Antibodies, IGG: 3.84 index (ref >0.99)

## 2018-02-14 LAB — HEMOGLOBIN A1C
Est. average glucose Bld gHb Est-mCnc: 103 mg/dL
Hgb A1c MFr Bld: 5.2 % (ref 4.8–5.6)

## 2018-02-14 LAB — VITAMIN B12: Vitamin B-12: 1126 pg/mL (ref 232–1245)

## 2018-02-14 LAB — VITAMIN D 25 HYDROXY (VIT D DEFICIENCY, FRACTURES): VIT D 25 HYDROXY: 37.7 ng/mL (ref 30.0–100.0)

## 2018-02-14 NOTE — Progress Notes (Signed)
She is aware, I have talked to her through Mariano Colan. She states that she had already had her pap this year. I informed her that her last was was in Nov. 2018 and that she will need another in Nov. 2019 and to call to schedule this.

## 2018-02-15 ENCOUNTER — Encounter: Payer: Self-pay | Admitting: Internal Medicine

## 2018-02-21 ENCOUNTER — Other Ambulatory Visit: Payer: Self-pay | Admitting: Internal Medicine

## 2018-02-21 DIAGNOSIS — R7989 Other specified abnormal findings of blood chemistry: Secondary | ICD-10-CM

## 2018-02-21 DIAGNOSIS — R945 Abnormal results of liver function studies: Principal | ICD-10-CM

## 2018-02-22 ENCOUNTER — Other Ambulatory Visit: Payer: Self-pay | Admitting: Internal Medicine

## 2018-02-22 DIAGNOSIS — G4700 Insomnia, unspecified: Secondary | ICD-10-CM

## 2018-02-22 DIAGNOSIS — F329 Major depressive disorder, single episode, unspecified: Secondary | ICD-10-CM

## 2018-02-22 DIAGNOSIS — F32A Depression, unspecified: Secondary | ICD-10-CM

## 2018-02-22 DIAGNOSIS — F419 Anxiety disorder, unspecified: Secondary | ICD-10-CM

## 2018-02-22 MED ORDER — ALPRAZOLAM 0.25 MG PO TABS: ORAL_TABLET | ORAL | 2 refills | Status: DC

## 2018-03-02 ENCOUNTER — Encounter: Payer: Self-pay | Admitting: Internal Medicine

## 2018-03-10 DIAGNOSIS — M2550 Pain in unspecified joint: Secondary | ICD-10-CM | POA: Diagnosis not present

## 2018-03-10 DIAGNOSIS — R5383 Other fatigue: Secondary | ICD-10-CM | POA: Diagnosis not present

## 2018-03-10 DIAGNOSIS — M150 Primary generalized (osteo)arthritis: Secondary | ICD-10-CM | POA: Diagnosis not present

## 2018-03-10 DIAGNOSIS — M0579 Rheumatoid arthritis with rheumatoid factor of multiple sites without organ or systems involvement: Secondary | ICD-10-CM | POA: Diagnosis not present

## 2018-03-11 ENCOUNTER — Encounter: Payer: Self-pay | Admitting: Internal Medicine

## 2018-03-14 ENCOUNTER — Other Ambulatory Visit: Payer: Self-pay | Admitting: Internal Medicine

## 2018-03-14 DIAGNOSIS — M549 Dorsalgia, unspecified: Principal | ICD-10-CM

## 2018-03-14 DIAGNOSIS — G8929 Other chronic pain: Secondary | ICD-10-CM

## 2018-03-14 MED ORDER — CYCLOBENZAPRINE HCL 10 MG PO TABS: 10.0000 mg | ORAL_TABLET | Freq: Three times a day (TID) | ORAL | 2 refills | Status: DC | PRN

## 2018-04-10 ENCOUNTER — Encounter: Payer: Self-pay | Admitting: Internal Medicine

## 2018-04-18 ENCOUNTER — Other Ambulatory Visit: Payer: Self-pay | Admitting: Internal Medicine

## 2018-04-18 DIAGNOSIS — M549 Dorsalgia, unspecified: Principal | ICD-10-CM

## 2018-04-18 DIAGNOSIS — G8929 Other chronic pain: Secondary | ICD-10-CM

## 2018-04-18 MED ORDER — CYCLOBENZAPRINE HCL 10 MG PO TABS: 10.0000 mg | ORAL_TABLET | Freq: Three times a day (TID) | ORAL | 1 refills | Status: DC | PRN

## 2018-05-10 DIAGNOSIS — M2550 Pain in unspecified joint: Secondary | ICD-10-CM | POA: Diagnosis not present

## 2018-05-10 DIAGNOSIS — M0579 Rheumatoid arthritis with rheumatoid factor of multiple sites without organ or systems involvement: Secondary | ICD-10-CM | POA: Diagnosis not present

## 2018-05-10 DIAGNOSIS — M150 Primary generalized (osteo)arthritis: Secondary | ICD-10-CM | POA: Diagnosis not present

## 2018-05-16 ENCOUNTER — Other Ambulatory Visit: Payer: Self-pay | Admitting: Internal Medicine

## 2018-05-16 DIAGNOSIS — F32A Depression, unspecified: Secondary | ICD-10-CM

## 2018-05-16 DIAGNOSIS — G4700 Insomnia, unspecified: Secondary | ICD-10-CM

## 2018-05-16 DIAGNOSIS — F419 Anxiety disorder, unspecified: Secondary | ICD-10-CM

## 2018-05-16 DIAGNOSIS — F329 Major depressive disorder, single episode, unspecified: Secondary | ICD-10-CM

## 2018-05-16 MED ORDER — ALPRAZOLAM 0.25 MG PO TABS: ORAL_TABLET | ORAL | 2 refills | Status: DC

## 2018-05-19 ENCOUNTER — Ambulatory Visit: Payer: 59 | Admitting: Internal Medicine

## 2018-05-19 ENCOUNTER — Encounter: Payer: Self-pay | Admitting: Internal Medicine

## 2018-05-19 DIAGNOSIS — Z0289 Encounter for other administrative examinations: Secondary | ICD-10-CM

## 2018-06-14 ENCOUNTER — Other Ambulatory Visit: Payer: Self-pay | Admitting: Internal Medicine

## 2018-06-14 DIAGNOSIS — G8929 Other chronic pain: Secondary | ICD-10-CM

## 2018-06-14 DIAGNOSIS — M549 Dorsalgia, unspecified: Principal | ICD-10-CM

## 2018-06-14 MED ORDER — CYCLOBENZAPRINE HCL 10 MG PO TABS: 10.0000 mg | ORAL_TABLET | Freq: Three times a day (TID) | ORAL | 2 refills | Status: DC | PRN

## 2018-06-28 ENCOUNTER — Ambulatory Visit: Payer: 59 | Admitting: Internal Medicine

## 2018-07-03 ENCOUNTER — Encounter: Payer: Self-pay | Admitting: Internal Medicine

## 2018-07-19 DIAGNOSIS — M62552 Muscle wasting and atrophy, not elsewhere classified, left thigh: Secondary | ICD-10-CM | POA: Diagnosis not present

## 2018-07-19 DIAGNOSIS — M1712 Unilateral primary osteoarthritis, left knee: Secondary | ICD-10-CM | POA: Diagnosis not present

## 2018-07-19 DIAGNOSIS — M25562 Pain in left knee: Secondary | ICD-10-CM | POA: Diagnosis not present

## 2018-07-19 DIAGNOSIS — I10 Essential (primary) hypertension: Secondary | ICD-10-CM | POA: Diagnosis not present

## 2018-07-19 DIAGNOSIS — M791 Myalgia, unspecified site: Secondary | ICD-10-CM | POA: Diagnosis not present

## 2018-07-19 DIAGNOSIS — E638 Other specified nutritional deficiencies: Secondary | ICD-10-CM | POA: Diagnosis not present

## 2018-07-19 DIAGNOSIS — E559 Vitamin D deficiency, unspecified: Secondary | ICD-10-CM | POA: Diagnosis not present

## 2018-07-19 DIAGNOSIS — E538 Deficiency of other specified B group vitamins: Secondary | ICD-10-CM | POA: Diagnosis not present

## 2018-08-02 DIAGNOSIS — M1712 Unilateral primary osteoarthritis, left knee: Secondary | ICD-10-CM | POA: Diagnosis not present

## 2018-08-16 ENCOUNTER — Other Ambulatory Visit: Payer: Self-pay | Admitting: Internal Medicine

## 2018-08-16 DIAGNOSIS — F32A Depression, unspecified: Secondary | ICD-10-CM

## 2018-08-16 DIAGNOSIS — F419 Anxiety disorder, unspecified: Secondary | ICD-10-CM

## 2018-08-16 DIAGNOSIS — G4700 Insomnia, unspecified: Secondary | ICD-10-CM

## 2018-08-16 DIAGNOSIS — F329 Major depressive disorder, single episode, unspecified: Secondary | ICD-10-CM

## 2018-08-16 MED ORDER — ALPRAZOLAM 0.25 MG PO TABS: ORAL_TABLET | ORAL | 2 refills | Status: DC

## 2018-08-17 DIAGNOSIS — M791 Myalgia, unspecified site: Secondary | ICD-10-CM | POA: Diagnosis not present

## 2018-09-03 ENCOUNTER — Encounter: Payer: Self-pay | Admitting: Internal Medicine

## 2018-09-04 ENCOUNTER — Other Ambulatory Visit: Payer: Self-pay | Admitting: Internal Medicine

## 2018-09-04 DIAGNOSIS — G8929 Other chronic pain: Secondary | ICD-10-CM

## 2018-09-04 DIAGNOSIS — G629 Polyneuropathy, unspecified: Secondary | ICD-10-CM

## 2018-09-04 DIAGNOSIS — M549 Dorsalgia, unspecified: Principal | ICD-10-CM

## 2018-09-04 MED ORDER — GABAPENTIN 600 MG PO TABS: ORAL_TABLET | ORAL | 6 refills | Status: DC

## 2018-09-05 DIAGNOSIS — M1712 Unilateral primary osteoarthritis, left knee: Secondary | ICD-10-CM | POA: Diagnosis not present

## 2018-09-05 DIAGNOSIS — Z1321 Encounter for screening for nutritional disorder: Secondary | ICD-10-CM | POA: Diagnosis not present

## 2018-09-05 DIAGNOSIS — M25562 Pain in left knee: Secondary | ICD-10-CM | POA: Diagnosis not present

## 2018-09-05 DIAGNOSIS — Z0189 Encounter for other specified special examinations: Secondary | ICD-10-CM | POA: Diagnosis not present

## 2018-09-05 DIAGNOSIS — Z1389 Encounter for screening for other disorder: Secondary | ICD-10-CM | POA: Diagnosis not present

## 2018-09-07 ENCOUNTER — Encounter: Payer: Self-pay | Admitting: Internal Medicine

## 2018-09-07 DIAGNOSIS — M25569 Pain in unspecified knee: Secondary | ICD-10-CM | POA: Insufficient documentation

## 2018-09-07 DIAGNOSIS — G8929 Other chronic pain: Secondary | ICD-10-CM | POA: Insufficient documentation

## 2018-09-07 DIAGNOSIS — M25562 Pain in left knee: Secondary | ICD-10-CM

## 2018-09-07 HISTORY — DX: Other chronic pain: G89.29

## 2018-09-13 ENCOUNTER — Other Ambulatory Visit: Payer: Self-pay | Admitting: Internal Medicine

## 2018-09-13 DIAGNOSIS — G8929 Other chronic pain: Secondary | ICD-10-CM

## 2018-09-13 DIAGNOSIS — M549 Dorsalgia, unspecified: Principal | ICD-10-CM

## 2018-09-13 MED ORDER — CYCLOBENZAPRINE HCL 10 MG PO TABS
10.0000 mg | ORAL_TABLET | Freq: Three times a day (TID) | ORAL | 2 refills | Status: DC | PRN
Start: 2018-09-13 — End: 2018-12-07

## 2018-09-17 DIAGNOSIS — M791 Myalgia, unspecified site: Secondary | ICD-10-CM | POA: Diagnosis not present

## 2018-09-21 DIAGNOSIS — G8918 Other acute postprocedural pain: Secondary | ICD-10-CM | POA: Diagnosis not present

## 2018-09-21 DIAGNOSIS — I10 Essential (primary) hypertension: Secondary | ICD-10-CM | POA: Diagnosis not present

## 2018-09-21 DIAGNOSIS — M1712 Unilateral primary osteoarthritis, left knee: Secondary | ICD-10-CM | POA: Diagnosis not present

## 2018-09-21 DIAGNOSIS — M25562 Pain in left knee: Secondary | ICD-10-CM | POA: Diagnosis not present

## 2018-09-21 DIAGNOSIS — M069 Rheumatoid arthritis, unspecified: Secondary | ICD-10-CM | POA: Diagnosis not present

## 2018-09-22 DIAGNOSIS — I10 Essential (primary) hypertension: Secondary | ICD-10-CM | POA: Diagnosis not present

## 2018-09-22 DIAGNOSIS — Z471 Aftercare following joint replacement surgery: Secondary | ICD-10-CM | POA: Diagnosis not present

## 2018-09-22 DIAGNOSIS — N289 Disorder of kidney and ureter, unspecified: Secondary | ICD-10-CM | POA: Diagnosis not present

## 2018-09-25 DIAGNOSIS — I10 Essential (primary) hypertension: Secondary | ICD-10-CM | POA: Diagnosis not present

## 2018-09-25 DIAGNOSIS — N289 Disorder of kidney and ureter, unspecified: Secondary | ICD-10-CM | POA: Diagnosis not present

## 2018-09-25 DIAGNOSIS — Z471 Aftercare following joint replacement surgery: Secondary | ICD-10-CM | POA: Diagnosis not present

## 2018-09-26 DIAGNOSIS — Z471 Aftercare following joint replacement surgery: Secondary | ICD-10-CM | POA: Diagnosis not present

## 2018-09-26 DIAGNOSIS — M25562 Pain in left knee: Secondary | ICD-10-CM | POA: Diagnosis not present

## 2018-09-26 DIAGNOSIS — N289 Disorder of kidney and ureter, unspecified: Secondary | ICD-10-CM | POA: Diagnosis not present

## 2018-09-26 DIAGNOSIS — I10 Essential (primary) hypertension: Secondary | ICD-10-CM | POA: Diagnosis not present

## 2018-09-28 DIAGNOSIS — Z471 Aftercare following joint replacement surgery: Secondary | ICD-10-CM | POA: Diagnosis not present

## 2018-09-28 DIAGNOSIS — N289 Disorder of kidney and ureter, unspecified: Secondary | ICD-10-CM | POA: Diagnosis not present

## 2018-09-28 DIAGNOSIS — I10 Essential (primary) hypertension: Secondary | ICD-10-CM | POA: Diagnosis not present

## 2018-10-02 DIAGNOSIS — Z471 Aftercare following joint replacement surgery: Secondary | ICD-10-CM | POA: Diagnosis not present

## 2018-10-02 DIAGNOSIS — I10 Essential (primary) hypertension: Secondary | ICD-10-CM | POA: Diagnosis not present

## 2018-10-02 DIAGNOSIS — N289 Disorder of kidney and ureter, unspecified: Secondary | ICD-10-CM | POA: Diagnosis not present

## 2018-10-03 DIAGNOSIS — M25562 Pain in left knee: Secondary | ICD-10-CM | POA: Diagnosis not present

## 2018-10-17 DIAGNOSIS — M791 Myalgia, unspecified site: Secondary | ICD-10-CM | POA: Diagnosis not present

## 2018-11-02 ENCOUNTER — Encounter: Payer: Self-pay | Admitting: Internal Medicine

## 2018-11-09 ENCOUNTER — Encounter: Payer: Self-pay | Admitting: Internal Medicine

## 2018-11-09 ENCOUNTER — Other Ambulatory Visit: Payer: Self-pay | Admitting: Internal Medicine

## 2018-11-09 DIAGNOSIS — F32A Depression, unspecified: Secondary | ICD-10-CM

## 2018-11-09 DIAGNOSIS — G4700 Insomnia, unspecified: Secondary | ICD-10-CM

## 2018-11-09 DIAGNOSIS — F419 Anxiety disorder, unspecified: Secondary | ICD-10-CM

## 2018-11-09 DIAGNOSIS — F329 Major depressive disorder, single episode, unspecified: Secondary | ICD-10-CM

## 2018-11-09 MED ORDER — ALPRAZOLAM 0.25 MG PO TABS: ORAL_TABLET | ORAL | 2 refills | Status: DC

## 2018-12-07 ENCOUNTER — Other Ambulatory Visit: Payer: Self-pay | Admitting: Internal Medicine

## 2018-12-07 DIAGNOSIS — M549 Dorsalgia, unspecified: Secondary | ICD-10-CM

## 2018-12-07 DIAGNOSIS — G8929 Other chronic pain: Secondary | ICD-10-CM

## 2018-12-07 MED ORDER — CYCLOBENZAPRINE HCL 10 MG PO TABS: 10.0000 mg | ORAL_TABLET | Freq: Three times a day (TID) | ORAL | 2 refills | Status: DC | PRN

## 2019-01-23 ENCOUNTER — Other Ambulatory Visit: Payer: Self-pay | Admitting: Internal Medicine

## 2019-01-23 ENCOUNTER — Encounter: Payer: Self-pay | Admitting: Internal Medicine

## 2019-01-23 DIAGNOSIS — Z124 Encounter for screening for malignant neoplasm of cervix: Secondary | ICD-10-CM

## 2019-01-23 DIAGNOSIS — Z8742 Personal history of other diseases of the female genital tract: Secondary | ICD-10-CM

## 2019-01-24 NOTE — Addendum Note (Signed)
Addended by: Orland Mustard on: 01/24/2019 05:57 PM   Modules accepted: Orders

## 2019-01-30 ENCOUNTER — Other Ambulatory Visit: Payer: Self-pay | Admitting: Internal Medicine

## 2019-01-30 DIAGNOSIS — F32A Depression, unspecified: Secondary | ICD-10-CM

## 2019-01-30 DIAGNOSIS — F329 Major depressive disorder, single episode, unspecified: Secondary | ICD-10-CM

## 2019-01-30 DIAGNOSIS — G4700 Insomnia, unspecified: Secondary | ICD-10-CM

## 2019-01-30 DIAGNOSIS — F419 Anxiety disorder, unspecified: Secondary | ICD-10-CM

## 2019-01-30 MED ORDER — ALPRAZOLAM 0.25 MG PO TABS: ORAL_TABLET | ORAL | 2 refills | Status: DC

## 2019-02-12 ENCOUNTER — Ambulatory Visit (INDEPENDENT_AMBULATORY_CARE_PROVIDER_SITE_OTHER): Payer: 59 | Admitting: Obstetrics and Gynecology

## 2019-02-12 ENCOUNTER — Other Ambulatory Visit: Payer: Self-pay

## 2019-02-12 ENCOUNTER — Encounter: Payer: Self-pay | Admitting: Obstetrics and Gynecology

## 2019-02-12 ENCOUNTER — Other Ambulatory Visit (HOSPITAL_COMMUNITY)
Admission: RE | Admit: 2019-02-12 | Discharge: 2019-02-12 | Disposition: A | Discharge status: Home / Self Care | Payer: 59 | Source: Ambulatory Visit | Attending: Obstetrics and Gynecology | Admitting: Obstetrics and Gynecology

## 2019-02-12 VITALS — BP 132/84 | HR 95 | Ht 62.0 in | Wt 149.0 lb

## 2019-02-12 DIAGNOSIS — N941 Unspecified dyspareunia: Secondary | ICD-10-CM

## 2019-02-12 DIAGNOSIS — N870 Mild cervical dysplasia: Secondary | ICD-10-CM

## 2019-02-12 DIAGNOSIS — Z124 Encounter for screening for malignant neoplasm of cervix: Secondary | ICD-10-CM

## 2019-02-12 DIAGNOSIS — F5222 Female sexual arousal disorder: Secondary | ICD-10-CM | POA: Diagnosis not present

## 2019-02-12 MED ORDER — ESTROGENS, CONJUGATED 0.625 MG/GM VA CREA: TOPICAL_CREAM | VAGINAL | 0 refills | Status: DC

## 2019-02-12 NOTE — Progress Notes (Signed)
Obstetrics & Gynecology Office Visit   Chief Complaint:  Chief Complaint  Patient presents with  . Abnormal Pap Smear    Referred by [facility] history of abnormal pap    History of Present Illness:Shelby Colon is a 53 y.o. woman seen in consultation at the request of Dr. Terese Door at [facility] for continued surveillance for history of dysplasia. Last pap obtained on 03/17/2017 revealed NIL HPV positive without subtyping for 16/18.  Prior on 04/05/2017 revealed CIN I at 10 O'Clock biopsy site, no changes at 4 O'Clock biopsy site and negative ECC.  In addition the patient reports menopause in her early 39's.  She does report having a prior bone density scan, multiple orthopedic surgeries.  She reports low libido which she personally finds distressing.  She also reports discomfort with intercourse.    Review of Systems: Review of Systems  Constitutional: Negative.   Genitourinary: Negative.   Skin: Negative.      Past Medical History:  Past Medical History:  Diagnosis Date  . Anxiety   . Atrial fibrillation ([place])   . B12 deficiency   . Chronic pain syndrome   . Colon polyps   . Congenital absence of right kidney   . Degeneration of lumbar intervertebral disc   . Depression   . Early menopause   . Gastric bypass status for obesity    2013, weight 258lb  . Headache   . Heart murmur    MVP  . History of chicken pox   . Hypercholesterolemia   . Hypertension   . Iron deficiency   . Neuropathy   . Rheumatoid arthritis ([place])    as child   . Scoliosis of lumbar spine   . Solitary kidney, congenital    pt thinks has left kidney   . Thyroid disease    in the past   . UTI (urinary tract infection)   . Vitamin D deficiency     Past Surgical History:  Past Surgical History:  Procedure Laterality Date  . AUGMENTATION MAMMAPLASTY Bilateral 2009  . BACK SURGERY  11/04/2014   fusion of spine of lumbosacral region 2 rods and 16 screws   . BREAST ENHANCEMENT SURGERY     . COLONOSCOPY WITH PROPOFOL N/A 01/06/2018   Procedure: COLONOSCOPY WITH PROPOFOL;  Surgeon: Virgel Manifold, MD;  Location: [facility] ENDOSCOPY;  Service: Endoscopy;  Laterality: N/A;  . excess skin removal    . KNEE ARTHROSCOPY Bilateral    x 7 knees b/l (2 surgeries on right knee) total 1 bone graft and 5 arthroscopy total knee   . ROUX-EN-Y PROCEDURE    . TOTAL KNEE ARTHROPLASTY     left 09/21/18 [name] [name]    Gynecologic History: No LMP recorded. Patient is postmenopausal.  Obstetric History: No obstetric history on file.  Family History:  Family History  Adopted: Yes  Problem Relation Age of Onset  . Arthritis Mother   . Drug abuse Mother        heriod OD  . Early death Mother   . Diabetes Father   . Hypertension Father   . Depression Father   . Arthritis Father   . Drug abuse Father   . Diabetes Sister   . Fibromyalgia Sister   . Rashes / Skin problems Sister        PSORIASIS  . Diabetes Brother   . Osteoarthritis Maternal Grandmother   . Aneurysm Maternal Aunt   . Thyroid disease Maternal Aunt   .  Fibromyalgia Maternal Aunt   . Breast cancer Maternal Aunt   . Cancer Maternal Aunt        breast cancer     Social History:  Social History   Socioeconomic History  . Marital status: Married    Spouse name: Not on file  . Number of children: Not on file  . Years of education: Not on file  . Highest education level: Not on file  Occupational History  . Not on file  Social Needs  . Financial resource strain: Not on file  . Food insecurity    Worry: Not on file    Inability: Not on file  . Transportation needs    Medical: Not on file    Non-medical: Not on file  Tobacco Use  . Smoking status: Never Smoker  . Smokeless tobacco: Never Used  Substance and Sexual Activity  . Alcohol use: Yes    Frequency: Never  . Drug use: No  . Sexual activity: Yes  Lifestyle  . Physical activity    Days per week: Not on file    Minutes per  session: Not on file  . Stress: Not on file  Relationships  . Social Herbalist on phone: Not on file    Gets together: Not on file    Attends religious service: Not on file    Active member of club or organization: Not on file    Attends meetings of clubs or organizations: Not on file    Relationship status: Not on file  . Intimate partner violence    Fear of current or ex partner: Not on file    Emotionally abused: Not on file    Physically abused: Not on file    Forced sexual activity: Not on file  Other Topics Concern  . 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 Secretary/administrator    BA degree    No guns, wears Colon belt, safe in relationship    Temp working at [employer]:  Allergies  Allergen Reactions  . Morphine Nausea And Vomiting    Other reaction(s): Other (See Comments)  . Oxycodone-Acetaminophen Nausea Only    Medications: Prior to Admission medications   Medication Sig Start Date End Date Taking? Authorizing Provider  ALPRAZolam Duanne Moron) 0.25 MG tablet 1 pill 2x per day as needed or 1-2 at night for sleep 01/30/19  Yes McLean-Scocuzza, Nino Glow, MD  cyclobenzaprine (FLEXERIL) 10 MG tablet Take 1 tablet (10 mg total) by mouth 3 (three) times daily as needed. 12/07/18  Yes McLean-Scocuzza, Nino Glow, MD  gabapentin (NEURONTIN) 600 MG tablet 1 pill in am 1 pill with lunch and 2 at night 09/04/18  Yes McLean-Scocuzza, Nino Glow, MD  Multiple Vitamin (MULTIVITAMIN) capsule Take by mouth.   Yes [provider]  NON FORMULARY Liquid vitamin Miracle 2000   Yes [provider]  conjugated estrogens (PREMARIN) vaginal cream 1g vaginally nightly 2 weeks, 1g vaginally every other night 2 weeks, then 1g vaginally twice weekly 02/12/19   Malachy Mood, MD  NONFORMULARY OR COMPOUNDED ITEM COMPOUNDED  MEDICATION  Baclofen 2% Cyclobenzaprine 2% Diclofenac 3% Lidocaine 3%  4 times a day as needed for knee    [provider]    Physical Exam Vitals:  Vitals:   02/12/19 1031  BP: 132/84  Pulse: 95   No LMP recorded. Patient is postmenopausal.  General: NAD, well nourished, appears stated age 35: normocephalic, anicteric Pulmonary: No increased work of breathing Genitourinary:  External: Normal external female genitalia.  Normal urethral meatus, normal  Bartholin's and Skene's glands.    Vagina: Normal vaginal mucosa, no evidence of prolapse.    Cervix: Grossly normal in appearance, no bleeding  Uterus: Non-enlarged, mobile, normal contour.  No CMT  Adnexa: ovaries non-enlarged, no adnexal masses  Rectal: deferred  Lymphatic: no evidence of inguinal lymphadenopathy Extremities: no edema, erythema, or tenderness Neurologic: Grossly intact Psychiatric: mood appropriate, affect full  Female chaperone present for pelvic and breast  portions of the physical exam        Assessment: 53 y.o.  follow up CIN I  Plan: Problem List Items Addressed This Visit    None    Visit Diagnoses    CIN I (cervical intraepithelial neoplasia I)    -  Primary   Relevant Orders   Cytology - PAP   Screening for malignant neoplasm of cervix       Relevant Orders   Cytology - PAP   Female sexual interest/arousal disorder, acquired, situational, moderate       Dyspareunia, female         CIN I  - Follow up pap smear from today. Her initial pap result would have been should have been triaged to 1 year follow up.  However, given that she did undergo biopsy showing CIN I we will proceed with CIN I with preceded lesser abnormality and obtain repeat pap today  - I had a lengthly discussion with Shelby Colon  regarding the cause of dysplasia of the lower genital tract (including immunosuppression in the setting of HPV exposure and tobacco exposure). I explained the potential for  progression to invasive malignancy, the recurrent nature of these lesions (and the need for close continued followup). Results of today's pap will dictate need for further evaluation and follow up per ASCCP guidelines..  - She is comfortable with the plan and had her questions answered.  Low Libido/dysparunia  - will start vaginal premarin for vulvovaginal atrophy  - if improvement in symptoms we discussed addition of Addyi for libido.  We discussed that benefits vary by patient.  On average an additional 1.5 satisfying sexual encounters a month were reported in subjects.  Use is contraindicated with alcohol.  Handouts were provided  - Return in about 8 weeks (around 04/09/2019) for medication follow up.   Malachy Mood, MD, [facility], [place]

## 2019-02-14 LAB — CYTOLOGY - PAP
Diagnosis: NEGATIVE
HPV: NOT DETECTED

## 2019-02-16 LAB — COMPREHENSIVE METABOLIC PANEL
ALT: 7 IU/L (ref 0–32)
AST: 17 IU/L (ref 0–40)
Albumin/Globulin Ratio: 1.6 (ref 1.2–2.2)
Albumin: 4.1 g/dL (ref 3.8–4.9)
Alkaline Phosphatase: 127 IU/L — ABNORMAL HIGH (ref 39–117)
BUN/Creatinine Ratio: 9 (ref 9–23)
BUN: 8 mg/dL (ref 6–24)
Bilirubin Total: 0.3 mg/dL (ref 0.0–1.2)
CO2: 24 mmol/L (ref 20–29)
Calcium: 8.9 mg/dL (ref 8.7–10.2)
Chloride: 99 mmol/L (ref 96–106)
Creatinine, Ser: 0.87 mg/dL (ref 0.57–1.00)
GFR calc Af Amer: 89 mL/min/{1.73_m2} (ref >59)
GFR calc non Af Amer: 77 mL/min/{1.73_m2} (ref >59)
Globulin, Total: 2.6 g/dL (ref 1.5–4.5)
Glucose: 86 mg/dL (ref 65–99)
Potassium: 4.2 mmol/L (ref 3.5–5.2)
Sodium: 139 mmol/L (ref 134–144)
Total Protein: 6.7 g/dL (ref 6.0–8.5)

## 2019-02-16 LAB — MICROSCOPIC EXAMINATION

## 2019-02-16 LAB — CBC WITH DIFFERENTIAL/PLATELET
Basophils Absolute: 0.1 10*3/uL (ref 0.0–0.2)
Basos: 1 %
EOS (ABSOLUTE): 0.2 10*3/uL (ref 0.0–0.4)
Eos: 2 %
Hematocrit: 36.3 % (ref 34.0–46.6)
Hemoglobin: 11.3 g/dL (ref 11.1–15.9)
Immature Grans (Abs): 0 10*3/uL (ref 0.0–0.1)
Immature Granulocytes: 0 %
Lymphocytes Absolute: 1.9 10*3/uL (ref 0.7–3.1)
Lymphs: 26 %
MCH: 26.1 pg — ABNORMAL LOW (ref 26.6–33.0)
MCHC: 31.1 g/dL — ABNORMAL LOW (ref 31.5–35.7)
MCV: 84 fL (ref 79–97)
Monocytes Absolute: 0.6 10*3/uL (ref 0.1–0.9)
Monocytes: 9 %
Neutrophils Absolute: 4.5 10*3/uL (ref 1.4–7.0)
Neutrophils: 62 %
Platelets: 453 10*3/uL — ABNORMAL HIGH (ref 150–450)
RBC: 4.33 x10E6/uL (ref 3.77–5.28)
RDW: 13.3 % (ref 11.7–15.4)
WBC: 7.3 10*3/uL (ref 3.4–10.8)

## 2019-02-16 LAB — URINALYSIS, ROUTINE W REFLEX MICROSCOPIC
Bilirubin, UA: NEGATIVE
Glucose, UA: NEGATIVE
Ketones, UA: NEGATIVE
Nitrite, UA: NEGATIVE
Protein,UA: NEGATIVE
RBC, UA: NEGATIVE
Specific Gravity, UA: 1.013 (ref 1.005–1.030)
Urobilinogen, Ur: 0.2 mg/dL (ref 0.2–1.0)
pH, UA: 6.5 (ref 5.0–7.5)

## 2019-02-16 LAB — LIPID PANEL
Chol/HDL Ratio: 1.6 ratio (ref 0.0–4.4)
Cholesterol, Total: 184 mg/dL (ref 100–199)
HDL: 117 mg/dL (ref >39)
LDL Chol Calc (NIH): 56 mg/dL (ref 0–99)
Triglycerides: 55 mg/dL (ref 0–149)
VLDL Cholesterol Cal: 11 mg/dL (ref 5–40)

## 2019-02-16 LAB — IRON,TIBC AND FERRITIN PANEL
Ferritin: 38 ng/mL (ref 15–150)
Iron Saturation: 12 % — ABNORMAL LOW (ref 15–55)
Iron: 35 ug/dL (ref 27–159)
Total Iron Binding Capacity: 286 ug/dL (ref 250–450)
UIBC: 251 ug/dL (ref 131–425)

## 2019-02-16 LAB — TSH: TSH: 2.89 u[IU]/mL (ref 0.450–4.500)

## 2019-02-19 ENCOUNTER — Encounter: Payer: Self-pay | Admitting: Internal Medicine

## 2019-02-19 ENCOUNTER — Other Ambulatory Visit: Payer: Self-pay | Admitting: Internal Medicine

## 2019-02-22 ENCOUNTER — Other Ambulatory Visit: Payer: Self-pay

## 2019-02-23 ENCOUNTER — Ambulatory Visit (INDEPENDENT_AMBULATORY_CARE_PROVIDER_SITE_OTHER): Payer: 59 | Admitting: Internal Medicine

## 2019-02-23 ENCOUNTER — Encounter: Payer: Self-pay | Admitting: Internal Medicine

## 2019-02-23 DIAGNOSIS — Z1231 Encounter for screening mammogram for malignant neoplasm of breast: Secondary | ICD-10-CM | POA: Diagnosis not present

## 2019-02-23 DIAGNOSIS — R748 Abnormal levels of other serum enzymes: Secondary | ICD-10-CM

## 2019-02-23 DIAGNOSIS — D473 Essential (hemorrhagic) thrombocythemia: Secondary | ICD-10-CM

## 2019-02-23 DIAGNOSIS — M79642 Pain in left hand: Secondary | ICD-10-CM

## 2019-02-23 DIAGNOSIS — Z9882 Breast implant status: Secondary | ICD-10-CM

## 2019-02-23 DIAGNOSIS — M25649 Stiffness of unspecified hand, not elsewhere classified: Secondary | ICD-10-CM

## 2019-02-23 DIAGNOSIS — M79641 Pain in right hand: Secondary | ICD-10-CM

## 2019-02-23 DIAGNOSIS — E611 Iron deficiency: Secondary | ICD-10-CM

## 2019-02-23 DIAGNOSIS — Z9884 Bariatric surgery status: Secondary | ICD-10-CM

## 2019-02-23 DIAGNOSIS — D75839 Thrombocytosis, unspecified: Secondary | ICD-10-CM

## 2019-02-23 HISTORY — DX: Abnormal levels of other serum enzymes: R74.8

## 2019-02-23 HISTORY — DX: Thrombocytosis, unspecified: D75.839

## 2019-02-23 NOTE — Progress Notes (Signed)
Virtual Visit via Video Note  I connected with Shelby Colon  on 02/23/19 at  4:00 PM EDT by a video enabled telemedicine application and verified that I am speaking with the correct person using two identifiers.  Location patient: home Location provider:work or home office Persons participating in the virtual visit: patient, provider  I discussed the limitations of evaluation and management by telemedicine and the availability of in person appointments. The patient expressed understanding and agreed to proceed.   HPI: 1. Feeling better s/p left knee surgery and moved to a new house with stairs and trying to ambulate still doing PT 2x per week  2. Vaginal dryness premarin is helping we also disc KY jelly to try to help with intimacy  3. C/o hands being stiff in the am saw  Shelby Colon 03/10/18 and 05/10/18 at this time was doing better but stiffness had returned  -will consider see rheum in future  4. Elevated alkaline phos denies excess etoh will check ggt denies abdominal pain  5. Reviewed labs elevated platelets and iron def s/p gastric bypass agreeable to see h/o  6. Reviewed all labs 02/15/2019    ROS: See pertinent positives and negatives per HPI.  Past Medical History:  Diagnosis Date  . Abnormal Pap smear of cervix    h/o LSIL pap with HPV +; 01/2019 pap negative negative HPV  . Anxiety   . Atrial fibrillation ([place])   . B12 deficiency   . Chronic pain syndrome   . Colon polyps   . Congenital absence of right kidney   . Degeneration of lumbar intervertebral disc   . Depression   . Early menopause   . Gastric bypass status for obesity    2013, weight 258lb  . Headache   . Heart murmur    MVP  . History of chicken pox   . Hypercholesterolemia   . Hypertension   . Iron deficiency   . Neuropathy   . Rheumatoid arthritis ([place])    as child   . Scoliosis of lumbar spine   . Solitary kidney, congenital    pt thinks has left kidney   . Thyroid disease    in the past    . UTI (urinary tract infection)   . Vitamin D deficiency     Past Surgical History:  Procedure Laterality Date  . AUGMENTATION MAMMAPLASTY Bilateral 2009  . BACK SURGERY  11/04/2014   fusion of spine of lumbosacral region 2 rods and 16 screws   . BREAST ENHANCEMENT SURGERY    . COLONOSCOPY WITH PROPOFOL N/A 01/06/2018   Procedure: COLONOSCOPY WITH PROPOFOL;  Surgeon: Virgel Manifold, MD;  Location: [facility] ENDOSCOPY;  Service: Endoscopy;  Laterality: N/A;  . excess skin removal    . KNEE ARTHROSCOPY Bilateral    x 7 knees b/l (2 surgeries on right knee) total 1 bone graft and 5 arthroscopy total knee   . ROUX-EN-Y PROCEDURE    . TOTAL KNEE ARTHROPLASTY     left 09/21/18 Shelby Colon Shelby Colon    Family History  Adopted: Yes  Problem Relation Age of Onset  . Arthritis Mother   . Drug abuse Mother        heriod OD  . Early death Mother   . Diabetes Father   . Hypertension Father   . Depression Father   . Arthritis Father   . Drug abuse Father   . Diabetes Sister   . Fibromyalgia Sister   . Rashes / Skin  problems Sister        PSORIASIS  . Diabetes Brother   . Osteoarthritis Maternal Grandmother   . Aneurysm Maternal Aunt   . Thyroid disease Maternal Aunt   . Fibromyalgia Maternal Aunt   . Breast cancer Maternal Aunt   . 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  ALPRAZolam (XANAX) 0.25 MG tablet, 1 pill 2x per day as needed or 1-2 at night for sleep, Disp: 60 tablet, Rfl: 2 .  conjugated estrogens (PREMARIN) vaginal cream, 1g vaginally nightly 2 weeks, 1g vaginally every other night 2 weeks, then 1g vaginally twice weekly, Disp: 30 g, Rfl: 0 .   cyclobenzaprine (FLEXERIL) 10 MG tablet, Take 1 tablet (10 mg total) by mouth 3 (three) times daily as needed., Disp: 90 tablet, Rfl: 2 .  gabapentin (NEURONTIN) 600 MG tablet, 1 pill in am 1 pill with lunch and 2 at night, Disp: 120 tablet, Rfl: 6 .  Multiple Vitamin (MULTIVITAMIN) capsule, Take by mouth., Disp: , Rfl:  .  NON FORMULARY, Liquid vitamin Miracle 2000, Disp: , Rfl:   EXAM:  VITALS per patient if applicable:  GENERAL: alert, oriented, appears well and in no acute distress  HEENT: atraumatic, conjunttiva clear, no obvious abnormalities on inspection of external nose and ears  NECK: normal movements of the head and neck  LUNGS: on inspection no signs of respiratory distress, breathing rate appears normal, no obvious gross SOB, gasping or wheezing  CV: no obvious cyanosis  MS: moves all visible extremities without noticeable abnormality  PSYCH/NEURO: pleasant and cooperative, no obvious depression or anxiety, speech and thought processing grossly intact  ASSESSMENT AND PLAN:  Discussed the following assessment and plan:  Elevated alkaline phosphatase level and h/o elevated lfts - Plan: US Abdomen Complete, Gamma GT [facility] -pending GGT US abdomen   Thrombocytosis (HCC)/iron def s/p gastric bypass - Plan: Ambulatory referral to Hematology Shelby Colon  Pain in both hands Stiffness of hand joint, unspecified laterality -last note rheumatology 04/2018 pt wants to hold for now consider her seeing Shelby Colon has seen Shelby Colon in the past and this would be for 2nd opinion to transfer care for joing pain  -hold appt for now   HM declines flu shot  Tdap had 02/01/17   LMP age 14 early menopause  Pap 02/12/19-neg neg hpv Shelby Colon  H/o abnormal pap 03/21/17 HPV bx 04/08/17 HPV LSIL [facility] Comment Comment: Material submitted:Marland Kitchen PART A: CERVIX, 4:00, BIOPSY PART B: CERVIX, 10:00, BIOPSY PART C: ENDOCERVIX, CURETTINGS    .  Comment Comment: Clinician provided ICD-10: R87.810   . Comment Comment:  Diagnosis: Part A: CERVIX, 4:00, BIOPSY: KOILOCYTOSIS CONSISTENT WITH HUMAN PAPILLOMAVIRUS (HPV) EFFECT. NO ENDOCERVICAL TISSUE. NEGATIVE FOR HIGH GRADE DYSPLASIA AND MALIGNANCY Part B: CERVIX, 10:00, BIOPSY: LOW-GRADE SQUAMOUS INTRAEPITHELIAL LESION (CIN 1). CHRONIC CERVICITIS. TRANSFORMATION ZONE NOT REPRESENTED. NEGATIVE FOR HIGH GRADE DYSPLASIA AND MALIGNANCY Part C: ENDOCERVIX, CURETTINGS: MINUTE FRAGMENTS OF BENIGN ENDOCERVICAL GLANDS, MUCUS, AND BLOOD. TRANSFORMATION ZONE NOT REPRESENTED. NEGATIVE FOR DYSPLASIA, VIRAL EFFECT, AND MALIGNANCY. COMMENT: The patients history of a positive hybrid capture DNA test for high-risk Human Papillomavirus serotypes is noted  which warrants continued follow up. CER/04/08/2017    . Comment Comment: Electronically signed: . Elwanda Brooklyn, MD, Pathologist   . Comment Comment: Gross description: . 3 Containers, formalin-filled, labeled with patient identification. Part A: CERVIX, 4:00, BIOPSY: Received in formalin is .3 X .3 X .1 cm in aggregate of mucinous material.Tissue is submitted in toto in 1 cassette(s). Part B: CERVIX, 10:00, BIOPSY: Received in formalin is .2 X .2 X .1 cm in aggregate of mucinous and tan tissue fragments.Tissue is submitted in toto in 1 cassette(s). Part C: ENDOCERVIX, CURETTINGS: Received in formalin is .5 X .5 X .2 cm in aggregate of mucinous, tan and brown material.Tissue is submitted in toto in 1 cassette(s). /SBB /SBB   . Comment Comment: Pathologist provided ICD-10: B97.7, N72, N87.0   . Comment Comment: CPT. [id number], [id number], [phone]      cologuard +04/14/17 - Referred to [facility] had 01/06/18 tubular adenoma poor prep rec f/u in 6-12 months  -as of  02/23/19 disc with pt about repeat  colonoscopy and wants to hold for now  -No known FH colon cancer found birth moms family     Mammogram referred for 3 D with implants had 01/03/18 negative  -encouraged to schedule   dexa 02/10/18 osteopenia  -needs to take calcium 600 mg qd and vitamin D3 4000 IU daily   No need for dermatology now    -we discussed possible serious and likely etiologies, options for evaluation and workup, limitations of telemedicine visit vs in person visit, treatment, treatment risks and precautions. Pt prefers to treat via telemedicine empirically rather then risking or undertaking an in person visit at this moment. Patient agrees to seek prompt in person care if worsening, new symptoms arise, or if is not improving with treatment.   I discussed the assessment and treatment plan with the patient. The patient was provided an opportunity to ask questions and all were answered. The patient agreed with the plan and demonstrated an understanding of the instructions.   The patient was advised to call back or seek an in-person evaluation if the symptoms worsen or if the condition fails to improve as anticipated.  Time spent 25 minutes  Delorise Jackson, MD

## 2019-02-27 ENCOUNTER — Other Ambulatory Visit: Payer: Self-pay

## 2019-02-28 ENCOUNTER — Encounter: Payer: Self-pay | Admitting: Oncology

## 2019-02-28 ENCOUNTER — Inpatient Hospital Stay: Payer: 59 | Attending: Oncology | Admitting: Oncology

## 2019-02-28 ENCOUNTER — Inpatient Hospital Stay: Payer: 59

## 2019-02-28 ENCOUNTER — Other Ambulatory Visit: Payer: Self-pay

## 2019-02-28 VITALS — BP 165/88 | HR 102 | Temp 97.8°F | Ht 62.0 in | Wt 152.0 lb

## 2019-02-28 DIAGNOSIS — D75839 Thrombocytosis, unspecified: Secondary | ICD-10-CM

## 2019-02-28 DIAGNOSIS — D473 Essential (hemorrhagic) thrombocythemia: Secondary | ICD-10-CM

## 2019-02-28 DIAGNOSIS — D649 Anemia, unspecified: Secondary | ICD-10-CM | POA: Diagnosis not present

## 2019-02-28 DIAGNOSIS — M545 Low back pain, unspecified: Secondary | ICD-10-CM | POA: Insufficient documentation

## 2019-02-28 DIAGNOSIS — D509 Iron deficiency anemia, unspecified: Secondary | ICD-10-CM

## 2019-02-28 DIAGNOSIS — Z803 Family history of malignant neoplasm of breast: Secondary | ICD-10-CM | POA: Insufficient documentation

## 2019-02-28 DIAGNOSIS — R748 Abnormal levels of other serum enzymes: Secondary | ICD-10-CM

## 2019-02-28 DIAGNOSIS — M25559 Pain in unspecified hip: Secondary | ICD-10-CM | POA: Insufficient documentation

## 2019-02-28 DIAGNOSIS — Z9884 Bariatric surgery status: Secondary | ICD-10-CM | POA: Diagnosis not present

## 2019-02-28 LAB — TECHNOLOGIST SMEAR REVIEW: Plt Morphology: INCREASED

## 2019-02-28 LAB — CBC WITH DIFFERENTIAL/PLATELET
Abs Immature Granulocytes: 0.02 10*3/uL (ref 0.00–0.07)
Basophils Absolute: 0.1 10*3/uL (ref 0.0–0.1)
Basophils Relative: 1 %
Eosinophils Absolute: 0.1 10*3/uL (ref 0.0–0.5)
Eosinophils Relative: 2 %
HCT: 32.5 % — ABNORMAL LOW (ref 36.0–46.0)
Hemoglobin: 9.7 g/dL — ABNORMAL LOW (ref 12.0–15.0)
Immature Granulocytes: 0 %
Lymphocytes Relative: 34 %
Lymphs Abs: 2 10*3/uL (ref 0.7–4.0)
MCH: 25.4 pg — ABNORMAL LOW (ref 26.0–34.0)
MCHC: 29.8 g/dL — ABNORMAL LOW (ref 30.0–36.0)
MCV: 85.1 fL (ref 80.0–100.0)
Monocytes Absolute: 0.4 10*3/uL (ref 0.1–1.0)
Monocytes Relative: 7 %
Neutro Abs: 3.3 10*3/uL (ref 1.7–7.7)
Neutrophils Relative %: 56 %
Platelets: 469 10*3/uL — ABNORMAL HIGH (ref 150–400)
RBC: 3.82 MIL/uL — ABNORMAL LOW (ref 3.87–5.11)
RDW: 15.4 % (ref 11.5–15.5)
WBC: 5.9 10*3/uL (ref 4.0–10.5)
nRBC: 0 % (ref 0.0–0.2)

## 2019-02-28 LAB — RETIC PANEL
Immature Retic Fract: 8.1 % (ref 2.3–15.9)
RBC.: 3.82 MIL/uL — ABNORMAL LOW (ref 3.87–5.11)
Retic Count, Absolute: 34 10*3/uL (ref 19.0–186.0)
Retic Ct Pct: 0.9 % (ref 0.4–3.1)
Reticulocyte Hemoglobin: 27.3 pg — ABNORMAL LOW (ref >27.9)

## 2019-02-28 LAB — HEPATITIS PANEL, ACUTE
HCV Ab: NONREACTIVE
Hep A IgM: NONREACTIVE
Hep B C IgM: NONREACTIVE
Hepatitis B Surface Ag: NONREACTIVE

## 2019-02-28 LAB — IRON AND TIBC
Iron: 38 ug/dL (ref 28–170)
Saturation Ratios: 11 % (ref 10.4–31.8)
TIBC: 353 ug/dL (ref 250–450)
UIBC: 315 ug/dL

## 2019-02-28 LAB — VITAMIN B12: Vitamin B-12: 756 pg/mL (ref 180–914)

## 2019-02-28 LAB — FOLATE: Folate: 11.3 ng/mL (ref >5.9)

## 2019-02-28 LAB — FERRITIN: Ferritin: 14 ng/mL (ref 11–307)

## 2019-02-28 LAB — GAMMA GT: GGT: 18 U/L (ref 7–50)

## 2019-02-28 NOTE — Progress Notes (Signed)
Patient stated that she had been doing well. Patient is here today to establish care.

## 2019-03-01 ENCOUNTER — Other Ambulatory Visit: Payer: Self-pay | Admitting: Internal Medicine

## 2019-03-01 ENCOUNTER — Encounter: Payer: Self-pay | Admitting: Internal Medicine

## 2019-03-01 DIAGNOSIS — M549 Dorsalgia, unspecified: Secondary | ICD-10-CM

## 2019-03-01 DIAGNOSIS — G8929 Other chronic pain: Secondary | ICD-10-CM

## 2019-03-01 MED ORDER — CYCLOBENZAPRINE HCL 10 MG PO TABS: 10.0000 mg | ORAL_TABLET | Freq: Three times a day (TID) | ORAL | 2 refills | Status: DC | PRN

## 2019-03-02 ENCOUNTER — Other Ambulatory Visit: Payer: Self-pay

## 2019-03-02 ENCOUNTER — Encounter: Payer: Self-pay | Admitting: Oncology

## 2019-03-02 ENCOUNTER — Encounter: Payer: Self-pay | Admitting: Internal Medicine

## 2019-03-02 ENCOUNTER — Ambulatory Visit
Admission: RE | Admit: 2019-03-02 | Discharge: 2019-03-02 | Disposition: A | Discharge status: Home / Self Care | Payer: 59 | Source: Ambulatory Visit | Attending: Internal Medicine | Admitting: Internal Medicine

## 2019-03-02 DIAGNOSIS — D509 Iron deficiency anemia, unspecified: Secondary | ICD-10-CM | POA: Insufficient documentation

## 2019-03-02 DIAGNOSIS — R748 Abnormal levels of other serum enzymes: Secondary | ICD-10-CM | POA: Insufficient documentation

## 2019-03-02 HISTORY — DX: Iron deficiency anemia, unspecified: D50.9

## 2019-03-02 LAB — COPPER, SERUM: Copper: 189 ug/dL — ABNORMAL HIGH (ref 72–166)

## 2019-03-02 NOTE — Progress Notes (Signed)
Hematology/Oncology Consult note [facility] Telephone:([phone] [phone]   Patient Care Team: McLean-Scocuzza, Nino Glow, MD as PCP - General (Internal Medicine)  REFERRING PROVIDER: McLean-Scocuzza, Olivia Mackie *  CHIEF COMPLAINTS/REASON FOR VISIT:  Evaluation of thrombocytosis  HISTORY OF PRESENTING ILLNESS:  Shelby Colon is a 53 y.o. female who was seen in consultation at the request of McLean-Scocuzza, Olivia Mackie * for evaluation of thromcbocytosis Reviewed patient's labs.  02/15/2019 Labs showed elevated platelet counts at 453, Hb 11.3,  wbc 7.3.   Reviewed patient's previous labs. Thrombocytosis onset is chronic onset , duration since 2019.  No aggravating or elevated factors. Associated symptoms or signs:  Denies weight loss, fever, chills, fatigue, night sweats.  Context:  Smoking history: never smoker Family history of polycythemia. denies History of iron deficiency anemia. Yes.  History of DVT, denies   Review of Systems  Constitutional: Positive for fatigue. Negative for appetite change, chills and fever.  HENT:   Negative for hearing loss and voice change.   Eyes: Negative for eye problems.  Respiratory: Negative for chest tightness and cough.   Cardiovascular: Negative for chest pain.  Gastrointestinal: Negative for abdominal distention, abdominal pain and blood in stool.  Endocrine: Negative for hot flashes.  Genitourinary: Negative for difficulty urinating and frequency.   Musculoskeletal: Negative for arthralgias.  Skin: Negative for itching and rash.  Neurological: Negative for extremity weakness.  Hematological: Negative for adenopathy.  Psychiatric/Behavioral: Negative for confusion.    MEDICAL HISTORY:  Past Medical History:  Diagnosis Date   Abnormal Pap smear of cervix    h/o LSIL pap with HPV +; 01/2019 pap negative negative HPV   Anxiety    Atrial fibrillation (HCC)    B12 deficiency    Chronic pain syndrome      Colon polyps    Congenital absence of right kidney    Degeneration of lumbar intervertebral disc    Depression    Early menopause    Gastric bypass status for obesity    2013, weight 258lb   Headache    Heart murmur    MVP   History of chicken pox    Hypercholesterolemia    Hypertension    Iron deficiency    Neuropathy    Rheumatoid arthritis ([place])    as child    Scoliosis of lumbar spine    Solitary kidney, congenital    pt thinks has left kidney    Thyroid disease    in the past    UTI (urinary tract infection)    Vitamin D deficiency     SURGICAL HISTORY: Past Surgical History:  Procedure Laterality Date   AUGMENTATION MAMMAPLASTY Bilateral 2009   BACK SURGERY  11/04/2014   fusion of spine of lumbosacral region 2 rods and 16 screws    BREAST ENHANCEMENT SURGERY     COLONOSCOPY WITH PROPOFOL N/A 01/06/2018   Procedure: COLONOSCOPY WITH PROPOFOL;  Surgeon: Virgel Manifold, MD;  Location: [facility] ENDOSCOPY;  Service: Endoscopy;  Laterality: N/A;   excess skin removal     KNEE ARTHROSCOPY Bilateral    x 7 knees b/l (2 surgeries on right knee) total 1 bone graft and 5 arthroscopy total knee    ROUX-EN-Y PROCEDURE     TOTAL KNEE ARTHROPLASTY     left 09/21/18 [name] [name]    SOCIAL HISTORY: Social History   Socioeconomic History   Marital status: Married    Spouse name: Not on file   Number of children: Not  on file   Years of education: Not on file   Highest education level: Not on file  Occupational History   Not on file  Social Needs   Financial resource strain: Not on file   Food insecurity    Worry: Not on file    Inability: Not on file   Transportation needs    Medical: Not on file    Non-medical: Not on file  Tobacco Use   Smoking status: Never Smoker   Smokeless tobacco: Never Used  Substance and Sexual Activity   Alcohol use: Yes    Frequency: Never   Drug use: No   Sexual  activity: Yes  Lifestyle   Physical activity    Days per week: Not on file    Minutes per session: Not on file   Stress: Not on file  Relationships   Social connections    Talks on phone: Not on file    Gets together: Not on file    Attends religious service: Not on file    Active member of club or organization: Not on file    Attends meetings of clubs or organizations: Not on file    Relationship status: Not on file   Intimate partner violence    Fear of current or ex partner: Not on file    Emotionally abused: Not on file    Physically abused: Not on file    Forced sexual activity: Not on file  Other Topics Concern   Not on file  Social History Narrative   Married since 2012    No kids    From [place] moved to [facility] and now lives here    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/2018    BA degree    No guns, wears seat belt, safe in relationship     FAMILY HISTORY: Family History  Adopted: Yes  Problem Relation Age of Onset   Arthritis Mother    Drug abuse Mother        heriod OD   Early death Mother    Diabetes Father    Hypertension Father    Depression Father    Arthritis Father    Drug abuse Father    Diabetes Sister    Fibromyalgia Sister    Rashes / Skin problems Sister        PSORIASIS   Diabetes Brother    Osteoarthritis Maternal Grandmother    Aneurysm Maternal Aunt    Thyroid disease Maternal Aunt    Fibromyalgia Maternal Aunt    Breast cancer Maternal Aunt    Cancer Maternal Aunt        breast cancer     ALLERGIES:  is allergic to morphine.  MEDICATIONS:  Current Outpatient Medications  Medication Sig Dispense Refill   ALPRAZolam (XANAX) 0.25 MG tablet 1 pill 2x per day as needed or 1-2 at night for sleep 60 tablet 2   conjugated estrogens (PREMARIN) vaginal cream 1g vaginally nightly 2 weeks, 1g vaginally every other night 2 weeks, then 1g  vaginally twice weekly 30 g 0   gabapentin (NEURONTIN) 600 MG tablet 1 pill in am 1 pill with lunch and 2 at night 120 tablet 6   Multiple Vitamin (MULTIVITAMIN) capsule Take by mouth.     NON FORMULARY Liquid vitamin Miracle 2000     cyclobenzaprine (FLEXERIL) 10 MG tablet Take 1 tablet (10 mg total) by mouth 3 (three) times daily as needed. [place]  tablet 2   No current facility-administered medications for this visit.      PHYSICAL EXAMINATION: ECOG PERFORMANCE STATUS: 1 - Symptomatic but completely ambulatory Vitals:   02/28/19 1512  BP: (!) 165/88  Pulse: (!) 102  Temp: 97.8 F (36.6 C)   Filed Weights   02/28/19 1512  Weight: 152 lb (68.9 kg)    Physical Exam Constitutional:      General: She is not in acute distress. HENT:     Head: Normocephalic and atraumatic.  Eyes:     General: No scleral icterus.    Pupils: Pupils are equal, round, and reactive to light.  Neck:     Musculoskeletal: Normal range of motion and neck supple.  Cardiovascular:     Rate and Rhythm: Normal rate and regular rhythm.     Heart sounds: Normal heart sounds.  Pulmonary:     Effort: Pulmonary effort is normal. No respiratory distress.     Breath sounds: No wheezing.  Abdominal:     General: Bowel sounds are normal. There is no distension.     Palpations: Abdomen is soft. There is no mass.     Tenderness: There is no abdominal tenderness.  Musculoskeletal: Normal range of motion.        General: No deformity.  Skin:    General: Skin is warm and dry.     Findings: No erythema or rash.  Neurological:     Mental Status: She is alert and oriented to person, place, and time.     Cranial Nerves: No cranial nerve deficit.     Coordination: Coordination normal.  Psychiatric:        Behavior: Behavior normal.        Thought Content: Thought content normal.      LABORATORY DATA:  I have reviewed the data as listed Lab Results  Component Value Date   WBC 5.9 02/28/2019   HGB 9.7 (L)  02/28/2019   HCT 32.5 (L) 02/28/2019   MCV 85.1 02/28/2019   PLT 469 (H) 02/28/2019   Recent Labs    02/15/19 1004  NA 139  K 4.2  CL 99  CO2 24  GLUCOSE 86  BUN 8  CREATININE 0.87  CALCIUM 8.9  GFRNONAA 77  GFRAA 89  PROT 6.7  ALBUMIN 4.1  AST 17  ALT 7  ALKPHOS 127*  BILITOT 0.3   Iron/TIBC/Ferritin/ %Sat    Component Value Date/Time   IRON 38 02/28/2019 1546   IRON 35 02/15/2019 1004   TIBC 353 02/28/2019 1546   TIBC 286 02/15/2019 1004   FERRITIN 14 02/28/2019 1546   FERRITIN 38 02/15/2019 1004   IRONPCTSAT 11 02/28/2019 1546   IRONPCTSAT 12 (L) 02/15/2019 1004   IRONPCTSAT 31 12/05/2017 1044      RADIOGRAPHIC STUDIES: I have personally reviewed the radiological images as listed and agreed with the findings in the report. US Abdomen Complete  Result Date: 03/02/2019 CLINICAL DATA:  Elevated liver enzymes EXAM: ABDOMEN ULTRASOUND COMPLETE COMPARISON:  None. FINDINGS: Gallbladder: Within the gallbladder, there are small gallstones intermingled with sludge. Largest gallstone measures 4 mm in length. There is no appreciable gallbladder wall thickening or pericholecystic fluid. There also appear to be small cholesterol crystals within the gallbladder. No sonographic Murphy sign noted by sonographer. Common bile duct: Diameter: 4 mm. No intrahepatic or extrahepatic biliary duct dilatation. Liver: No focal lesion identified. Within normal limits in parenchymal echogenicity. Portal vein is patent on color Doppler imaging with normal direction of blood  flow towards the liver. IVC: No abnormality visualized. Pancreas: Visualized portion unremarkable. Portions of pancreas obscured by gas. Spleen: Size and appearance within normal limits. Right Kidney: Right kidney is not seen in the right flank position. A right kidney is not seen on this study. Left Kidney: Length: 11.2 cm. Echogenicity within normal limits. No mass or hydronephrosis visualized. Abdominal aorta: No aneurysm  visualized. Other findings: No demonstrable ascites. IMPRESSION: 1. Cholelithiasis intermingled with sludge. No gallbladder wall thickening or pericholecystic fluid. 2. Right kidney not identified on this study. Question absent right kidney. Ectopically located right kidney is a possibility. In this regard, it may be reasonable to consider nuclear medicine renal scan to assess for ectopic renal tissue. Left kidney is in the normal flank position and appears normal. 3. Portions of pancreas obscured by gas. Visualized portions of pancreas appear normal. 4.  Study otherwise unremarkable. Electronically Signed   By: Lowella Grip III M.D.   On: 03/02/2019 14:08      ASSESSMENT & PLAN:  1. Normocytic anemia   2. Thrombocytosis ([place])   3. History of Roux-en-Y gastric bypass   4. Elevated alkaline phosphatase level    I discussed with patient that the differential diagnosis of the thrombosis is broad, including benign etiology such as reactive to surgery, trauma, infection, nutrition deficiency, etc, as well as malignant etiology including underlying bone marrow disorders.   For the work up of patient's thrombocytosis, I recommend checking CBC;CMP, LDH, pathology smear review, iron,TIBC, ferritin, hepatitis panel. retic panel.   Elevated Alkaline phosphatase level,  Check GGT.  # history of gastric bypass, check copper, B12, folate.   Orders Placed This Encounter  Procedures   Gamma GT    Standing Status:   Future    Number of Occurrences:   1    Standing Expiration Date:   02/28/2020   CBC with Differential/Platelet    Standing Status:   Future    Number of Occurrences:   1    Standing Expiration Date:   02/28/2020   Technologist smear review    Standing Status:   Future    Number of Occurrences:   1    Standing Expiration Date:   02/28/2020   Vitamin B12    Standing Status:   Future    Number of Occurrences:   1    Standing Expiration Date:   02/28/2020   Folate    Standing  Status:   Future    Number of Occurrences:   1    Standing Expiration Date:   02/28/2020   Copper, serum    Standing Status:   Future    Number of Occurrences:   1    Standing Expiration Date:   02/28/2020   Retic Panel    Standing Status:   Future    Number of Occurrences:   1    Standing Expiration Date:   02/28/2020   Iron and TIBC    Standing Status:   Future    Number of Occurrences:   1    Standing Expiration Date:   02/28/2020   Ferritin    Standing Status:   Future    Number of Occurrences:   1    Standing Expiration Date:   02/28/2020   Hepatitis panel, acute    Standing Status:   Future    Number of Occurrences:   1    Standing Expiration Date:   02/28/2020    All questions were answered. The patient knows  to call the clinic with any problems questions or concerns.  Cc McLean-Scocuzza, Olivia Mackie *  Return of visit: 1 week  We spent sufficient time to discuss many aspect of care, questions were answered to patient's satisfaction. Total face to face encounter time for this patient visit was 45 min. >50% of the time was  spent in counseling and coordination of care.    Earlie Server, MD, PhD 03/02/2019

## 2019-03-05 ENCOUNTER — Encounter: Payer: Self-pay | Admitting: Internal Medicine

## 2019-03-05 ENCOUNTER — Telehealth: Payer: Self-pay | Admitting: Gastroenterology

## 2019-03-05 ENCOUNTER — Telehealth: Payer: Self-pay

## 2019-03-05 ENCOUNTER — Other Ambulatory Visit: Payer: Self-pay

## 2019-03-05 DIAGNOSIS — N261 Atrophy of kidney (terminal): Secondary | ICD-10-CM | POA: Insufficient documentation

## 2019-03-05 DIAGNOSIS — Z1211 Encounter for screening for malignant neoplasm of colon: Secondary | ICD-10-CM

## 2019-03-05 HISTORY — DX: Atrophy of kidney (terminal): N26.1

## 2019-03-05 MED ORDER — BISACODYL EC 5 MG PO TBEC: DELAYED_RELEASE_TABLET | ORAL | 0 refills | Status: DC

## 2019-03-05 MED ORDER — NA SULFATE-K SULFATE-MG SULF 17.5-3.13-1.6 GM/177ML PO SOLN: 354.0000 mL | Freq: Once | ORAL | 0 refills | Status: AC

## 2019-03-05 NOTE — Telephone Encounter (Signed)
-----   Message from Earlie Server, MD sent at 03/02/2019 10:57 PM EDT ----- Please let her know that blood work showed blood level is low , due to iron deficiency. Can move her appt to next week for MD + Venofer. If she wants to keep current follow up, that's ok too. Just add venofer to next visit. Thank you.

## 2019-03-05 NOTE — Telephone Encounter (Signed)
Patient states they called and changed her extended appointment to another day. She can still go for a covid test

## 2019-03-05 NOTE — Telephone Encounter (Signed)
Patient called & states she is scheduled for a colonoscopy on 03-19-19 with Dr Bonna Gains & to take the covid test 03-15-19. She will be having an extended doctor's visit on 03-15-19 and would like to know if she can have a different time for the test?

## 2019-03-05 NOTE — Telephone Encounter (Signed)
Pt left vm she was seen last year and would like to discuss scheduling another colonoscopy please call pt

## 2019-03-05 NOTE — Telephone Encounter (Signed)
Called patient and scheduled patient on 03/19/2019 for colonoscopy. Went over instructions with patient and sent them to mychart. Sent medication for prep to the pharmacy

## 2019-03-05 NOTE — Telephone Encounter (Signed)
Left message informing patient we are going to add venofer infusion to her MD appt she has scheduled on 10/15

## 2019-03-15 ENCOUNTER — Ambulatory Visit: Payer: 59 | Admitting: Oncology

## 2019-03-15 ENCOUNTER — Other Ambulatory Visit: Payer: Self-pay

## 2019-03-15 ENCOUNTER — Encounter: Payer: Self-pay | Admitting: Oncology

## 2019-03-15 ENCOUNTER — Other Ambulatory Visit
Admission: RE | Admit: 2019-03-15 | Discharge: 2019-03-15 | Disposition: A | Discharge status: Home / Self Care | Payer: 59 | Source: Ambulatory Visit | Attending: Gastroenterology | Admitting: Gastroenterology

## 2019-03-15 DIAGNOSIS — Z01812 Encounter for preprocedural laboratory examination: Secondary | ICD-10-CM | POA: Diagnosis not present

## 2019-03-15 DIAGNOSIS — Z20828 Contact with and (suspected) exposure to other viral communicable diseases: Secondary | ICD-10-CM | POA: Insufficient documentation

## 2019-03-16 ENCOUNTER — Other Ambulatory Visit: Payer: Self-pay

## 2019-03-16 ENCOUNTER — Encounter: Payer: Self-pay | Admitting: Oncology

## 2019-03-16 ENCOUNTER — Inpatient Hospital Stay: Payer: 59 | Attending: Oncology | Admitting: Oncology

## 2019-03-16 ENCOUNTER — Inpatient Hospital Stay: Payer: 59

## 2019-03-16 VITALS — BP 147/85 | Temp 98.1°F | Resp 16 | Wt 149.1 lb

## 2019-03-16 VITALS — BP 131/82 | HR 90 | Resp 18

## 2019-03-16 DIAGNOSIS — D509 Iron deficiency anemia, unspecified: Secondary | ICD-10-CM | POA: Diagnosis not present

## 2019-03-16 DIAGNOSIS — D473 Essential (hemorrhagic) thrombocythemia: Secondary | ICD-10-CM | POA: Diagnosis not present

## 2019-03-16 DIAGNOSIS — D75839 Thrombocytosis, unspecified: Secondary | ICD-10-CM

## 2019-03-16 DIAGNOSIS — D649 Anemia, unspecified: Secondary | ICD-10-CM | POA: Diagnosis not present

## 2019-03-16 DIAGNOSIS — Z9884 Bariatric surgery status: Secondary | ICD-10-CM | POA: Diagnosis not present

## 2019-03-16 LAB — SARS CORONAVIRUS 2 (TAT 6-24 HRS): SARS Coronavirus 2: NEGATIVE

## 2019-03-16 MED ORDER — IRON SUCROSE 20 MG/ML IV SOLN
200.0000 mg | Freq: Once | INTRAVENOUS | Status: AC
  Administered 2019-03-16: 200 mg via INTRAVENOUS
  Filled 2019-03-16: qty 10

## 2019-03-16 MED ORDER — SODIUM CHLORIDE 0.9 % IV SOLN: 200.0000 mg | Freq: Once | INTRAVENOUS | Status: DC

## 2019-03-16 MED ORDER — SODIUM CHLORIDE 0.9 % IV SOLN
Freq: Once | INTRAVENOUS | Status: AC
  Administered 2019-03-16: 14:00:00 via INTRAVENOUS
  Filled 2019-03-16: qty 250

## 2019-03-16 NOTE — Progress Notes (Signed)
Patient does not offer any problems today.  

## 2019-03-16 NOTE — Progress Notes (Signed)
Hematology/Oncology follow up note [facility] [phone] Fax:([phone]   Patient Care Team: McLean-Scocuzza, Nino Glow, MD as PCP - General (Internal Medicine)  REFERRING PROVIDER: McLean-Scocuzza, Olivia Mackie *  CHIEF COMPLAINTS/REASON FOR VISIT:  Evaluation of thrombocytosis  HISTORY OF PRESENTING ILLNESS:  Shelby Colon is a 53 y.o. female who was seen in consultation at the request of McLean-Scocuzza, Olivia Mackie * for evaluation of thromcbocytosis Reviewed patient's labs.  02/15/2019 Labs showed elevated platelet counts at 453, Hb 11.3,  wbc 7.3.   Reviewed patient's previous labs. Thrombocytosis onset is chronic onset , duration since 2019.  No aggravating or elevated factors. Associated symptoms or signs:  Denies weight loss, fever, chills, fatigue, night sweats.  Context:  Smoking history: never smoker Family history of polycythemia. denies History of iron deficiency anemia. Yes.  History of DVT, denies History of gastric bypass.   INTERVAL HISTORY Shelby Colon is a 53 y.o. female who has above history reviewed by me today presents for follow up visit for management of iron deficiency anemia, thrombocytosis.  Problems and complaints are listed below: She had lab work up done last week for work up of thrombocytosis and was found to have iron deficiency.  She presents to discuss IV iron treatment options.  Continue to have profound fatigue.   Review of Systems  Constitutional: Positive for fatigue. Negative for appetite change, chills and fever.  HENT:   Negative for hearing loss and voice change.   Eyes: Negative for eye problems.  Respiratory: Negative for chest tightness and cough.   Cardiovascular: Negative for chest pain.  Gastrointestinal: Negative for abdominal distention, abdominal pain and blood in stool.  Endocrine: Negative for hot flashes.  Genitourinary: Negative for difficulty urinating and frequency.   Musculoskeletal:  Negative for arthralgias.  Skin: Negative for itching and rash.  Neurological: Negative for extremity weakness.  Hematological: Negative for adenopathy.  Psychiatric/Behavioral: Negative for confusion.    MEDICAL HISTORY:  Past Medical History:  Diagnosis Date  . Abnormal Pap smear of cervix    h/o LSIL pap with HPV +; 01/2019 pap negative negative HPV  . Anxiety   . Atrial fibrillation ([place])   . B12 deficiency   . Chronic pain syndrome   . Colon polyps   . Congenital absence of right kidney   . Degeneration of lumbar intervertebral disc   . Depression   . Early menopause   . Gastric bypass status for obesity    2013, weight 258lb  . Headache   . Heart murmur    MVP  . History of chicken pox   . Hypercholesterolemia   . Hypertension   . Iron deficiency   . Iron deficiency anemia 03/02/2019  . Neuropathy   . Rheumatoid arthritis ([place])    as child   . Scoliosis of lumbar spine   . Solitary kidney, congenital    pt thinks has left kidney   . Thyroid disease    in the past   . UTI (urinary tract infection)   . Vitamin D deficiency     SURGICAL HISTORY: Past Surgical History:  Procedure Laterality Date  . AUGMENTATION MAMMAPLASTY Bilateral 2009  . BACK SURGERY  11/04/2014   fusion of spine of lumbosacral region 2 rods and 16 screws   . BREAST ENHANCEMENT SURGERY    . COLONOSCOPY WITH PROPOFOL N/A 01/06/2018   Procedure: COLONOSCOPY WITH PROPOFOL;  Surgeon: Virgel Manifold, MD;  Location: [facility] ENDOSCOPY;  Service: Endoscopy;  Laterality: N/A;  . excess  skin removal    . KNEE ARTHROSCOPY Bilateral    x 7 knees b/l (2 surgeries on right knee) total 1 bone graft and 5 arthroscopy total knee   . ROUX-EN-Y PROCEDURE    . TOTAL KNEE ARTHROPLASTY     left 09/21/18 [name] [name]    SOCIAL HISTORY: Social History   Socioeconomic History  . Marital status: Married    Spouse name: Not on file  . Number of children: Not on file  . Years of  education: Not on file  . Highest education level: Not on file  Occupational History  . Not on file  Social Needs  . Financial resource strain: Not on file  . Food insecurity    Worry: Not on file    Inability: Not on file  . Transportation needs    Medical: Not on file    Non-medical: Not on file  Tobacco Use  . Smoking status: Never Smoker  . Smokeless tobacco: Never Used  Substance and Sexual Activity  . Alcohol use: Yes    Frequency: Never  . Drug use: No  . Sexual activity: Yes  Lifestyle  . Physical activity    Days per week: Not on file    Minutes per session: Not on file  . Stress: Not on file  Relationships  . Social Herbalist on phone: Not on file    Gets together: Not on file    Attends religious service: Not on file    Active member of club or organization: Not on file    Attends meetings of clubs or organizations: Not on file    Relationship status: Not on file  . Intimate partner violence    Fear of current or ex partner: Not on file    Emotionally abused: Not on file    Physically abused: Not on file    Forced sexual activity: Not on file  Other Topics Concern  . Not on file  Social History Narrative   Married since 2012    No kids    From [facility] moved to [facility] and now lives here    Currently unemployed used to be [place] unemployed since 04/2018    BA degree    No guns, wears seat belt, safe in relationship     FAMILY HISTORY: Family History  Adopted: Yes  Problem Relation Age of Onset  . Arthritis Mother   . Drug abuse Mother        heriod OD  . Early death Mother   . Diabetes Father   . Hypertension Father   . Depression Father   . Arthritis Father   . Drug abuse Father   . Diabetes Sister   . Fibromyalgia Sister   . Rashes / Skin problems Sister        PSORIASIS  . Diabetes Brother   . Osteoarthritis Maternal Grandmother   . Aneurysm Maternal Aunt   . Thyroid disease  Maternal Aunt   . Fibromyalgia Maternal Aunt   . Breast cancer Maternal Aunt   . Cancer Maternal Aunt        breast cancer     ALLERGIES:  is allergic to morphine.  MEDICATIONS:  Current Outpatient Medications  Medication Sig Dispense Refill  . ALPRAZolam (XANAX) 0.25 MG tablet 1 pill 2x per day as needed or 1-2 at night for sleep 60 tablet 2  . bisacodyl (BISACODYL) 5 MG EC tablet Take  2 tablets (10mg ) between 1pm and 3pm the day before your procedure 2 tablet 0  . conjugated estrogens (PREMARIN) vaginal cream 1g vaginally nightly 2 weeks, 1g vaginally every other night 2 weeks, then 1g vaginally twice weekly 30 g 0  . cyclobenzaprine (FLEXERIL) 10 MG tablet Take 1 tablet (10 mg total) by mouth 3 (three) times daily as needed. 90 tablet 2  . gabapentin (NEURONTIN) 600 MG tablet 1 pill in am 1 pill with lunch and 2 at night 120 tablet 6  . NON FORMULARY Liquid vitamin Miracle 2000     No current facility-administered medications for this visit.      PHYSICAL EXAMINATION: ECOG PERFORMANCE STATUS: 1 - Symptomatic but completely ambulatory Vitals:   03/16/19 1311  BP: (!) 147/85  Resp: 16  Temp: 98.1 F (36.7 C)   Filed Weights   03/16/19 1311  Weight: 149 lb 1.6 oz (67.6 kg)    Physical Exam Constitutional:      General: She is not in acute distress. HENT:     Head: Normocephalic and atraumatic.  Eyes:     General: No scleral icterus.    Pupils: Pupils are equal, round, and reactive to light.  Neck:     Musculoskeletal: Normal range of motion and neck supple.  Cardiovascular:     Rate and Rhythm: Normal rate and regular rhythm.     Heart sounds: Normal heart sounds.  Pulmonary:     Effort: Pulmonary effort is normal. No respiratory distress.     Breath sounds: No wheezing.  Abdominal:     General: Bowel sounds are normal. There is no distension.     Palpations: Abdomen is soft. There is no mass.     Tenderness: There is no abdominal tenderness.  Musculoskeletal:  Normal range of motion.        General: No deformity.  Skin:    General: Skin is warm and dry.     Findings: No erythema or rash.  Neurological:     Mental Status: She is alert and oriented to person, place, and time.     Cranial Nerves: No cranial nerve deficit.     Coordination: Coordination normal.  Psychiatric:        Behavior: Behavior normal.        Thought Content: Thought content normal.      LABORATORY DATA:  I have reviewed the data as listed Lab Results  Component Value Date   WBC 5.9 02/28/2019   HGB 9.7 (L) 02/28/2019   HCT 32.5 (L) 02/28/2019   MCV 85.1 02/28/2019   PLT 469 (H) 02/28/2019   Recent Labs    02/15/19 1004  NA 139  K 4.2  CL 99  CO2 24  GLUCOSE 86  BUN 8  CREATININE 0.87  CALCIUM 8.9  GFRNONAA 77  GFRAA 89  PROT 6.7  ALBUMIN 4.1  AST 17  ALT 7  ALKPHOS 127*  BILITOT 0.3   Iron/TIBC/Ferritin/ %Sat    Component Value Date/Time   IRON 38 02/28/2019 1546   IRON 35 02/15/2019 1004   TIBC 353 02/28/2019 1546   TIBC 286 02/15/2019 1004   FERRITIN 14 02/28/2019 1546   FERRITIN 38 02/15/2019 1004   IRONPCTSAT 11 02/28/2019 1546   IRONPCTSAT 12 (L) 02/15/2019 1004   IRONPCTSAT 31 12/05/2017 1044      RADIOGRAPHIC STUDIES: I have personally reviewed the radiological images as listed and agreed with the findings in the report. US Abdomen Complete  Result Date: 03/02/2019  CLINICAL DATA:  Elevated liver enzymes EXAM: ABDOMEN ULTRASOUND COMPLETE COMPARISON:  None. FINDINGS: Gallbladder: Within the gallbladder, there are small gallstones intermingled with sludge. Largest gallstone measures 4 mm in length. There is no appreciable gallbladder wall thickening or pericholecystic fluid. There also appear to be small cholesterol crystals within the gallbladder. No sonographic Murphy sign noted by sonographer. Common bile duct: Diameter: 4 mm. No intrahepatic or extrahepatic biliary duct dilatation. Liver: No focal lesion identified. Within normal  limits in parenchymal echogenicity. Portal vein is patent on color Doppler imaging with normal direction of blood flow towards the liver. IVC: No abnormality visualized. Pancreas: Visualized portion unremarkable. Portions of pancreas obscured by gas. Spleen: Size and appearance within normal limits. Right Kidney: Right kidney is not seen in the right flank position. A right kidney is not seen on this study. Left Kidney: Length: 11.2 cm. Echogenicity within normal limits. No mass or hydronephrosis visualized. Abdominal aorta: No aneurysm visualized. Other findings: No demonstrable ascites. IMPRESSION: 1. Cholelithiasis intermingled with sludge. No gallbladder wall thickening or pericholecystic fluid. 2. Right kidney not identified on this study. Question absent right kidney. Ectopically located right kidney is a possibility. In this regard, it may be reasonable to consider nuclear medicine renal scan to assess for ectopic renal tissue. Left kidney is in the normal flank position and appears normal. 3. Portions of pancreas obscured by gas. Visualized portions of pancreas appear normal. 4.  Study otherwise unremarkable. Electronically Signed   By: Lowella Grip III M.D.   On: 03/02/2019 14:08      ASSESSMENT & PLAN:  1. Normocytic anemia   2. History of Roux-en-Y gastric bypass   3. Thrombocytosis ([place])   4. Iron deficiency anemia, unspecified iron deficiency anemia type    Labs are reviewed and discussed with patient. Iron panel shows that she has ferritin 14, iron saturation 11.  Normal folate and B12 level.  Hemoglobin 9.1 Thrombocytosis can be secondary to iron deficiency  Plan IV iron with Venofer 200mg  weekly x 3 doses. Allergy reactions/infusion reaction including anaphylactic reaction discussed with patient. Other side effects include but not limited to high blood pressure, skin rash, weight gain, leg swelling, etc. Patient voices understanding and willing to proceed.   Orders Placed This  Encounter  Procedures  . CBC with Differential/Platelet    Standing Status:   Future    Standing Expiration Date:   03/15/2020  . Ferritin    Standing Status:   Future    Standing Expiration Date:   03/15/2020  . Iron and TIBC    Standing Status:   Future    Standing Expiration Date:   03/15/2020    All questions were answered. The patient knows to call the clinic with any problems questions or concerns.  Cc McLean-Scocuzza, Olivia Mackie *  Return of visit: 6 week   Earlie Server, MD, PhD 03/16/2019

## 2019-03-16 NOTE — Progress Notes (Signed)
Pt tolerated Venofer infusion well with no signs of complication or reaction. RN educated pt on the importance of notifying the clinic if any complications arises at home or call 911 if it is an emergency. Pt verbalized understanding. VSS, pt stable for discharge.   Shelby Colon [employer]

## 2019-03-19 ENCOUNTER — Encounter
Admission: RE | Disposition: A | Discharge status: Home / Self Care | Payer: Self-pay | Source: Home / Self Care | Attending: Gastroenterology

## 2019-03-19 ENCOUNTER — Encounter: Payer: Self-pay | Admitting: Internal Medicine

## 2019-03-19 ENCOUNTER — Ambulatory Visit: Payer: 59 | Admitting: Registered Nurse

## 2019-03-19 ENCOUNTER — Encounter: Payer: Self-pay | Admitting: *Deleted

## 2019-03-19 ENCOUNTER — Ambulatory Visit
Admission: RE | Admit: 2019-03-19 | Discharge: 2019-03-19 | Disposition: A | Discharge status: Home / Self Care | Payer: 59 | Attending: Gastroenterology | Admitting: Gastroenterology

## 2019-03-19 DIAGNOSIS — Z9884 Bariatric surgery status: Secondary | ICD-10-CM | POA: Insufficient documentation

## 2019-03-19 DIAGNOSIS — Z96652 Presence of left artificial knee joint: Secondary | ICD-10-CM | POA: Diagnosis not present

## 2019-03-19 DIAGNOSIS — G894 Chronic pain syndrome: Secondary | ICD-10-CM | POA: Insufficient documentation

## 2019-03-19 DIAGNOSIS — K635 Polyp of colon: Secondary | ICD-10-CM | POA: Diagnosis not present

## 2019-03-19 DIAGNOSIS — Z79899 Other long term (current) drug therapy: Secondary | ICD-10-CM | POA: Diagnosis not present

## 2019-03-19 DIAGNOSIS — M069 Rheumatoid arthritis, unspecified: Secondary | ICD-10-CM | POA: Insufficient documentation

## 2019-03-19 DIAGNOSIS — Z860100 Personal history of colon polyps, unspecified: Secondary | ICD-10-CM

## 2019-03-19 DIAGNOSIS — F419 Anxiety disorder, unspecified: Secondary | ICD-10-CM | POA: Diagnosis not present

## 2019-03-19 DIAGNOSIS — I10 Essential (primary) hypertension: Secondary | ICD-10-CM | POA: Insufficient documentation

## 2019-03-19 DIAGNOSIS — Z1211 Encounter for screening for malignant neoplasm of colon: Secondary | ICD-10-CM | POA: Diagnosis not present

## 2019-03-19 DIAGNOSIS — Z885 Allergy status to narcotic agent status: Secondary | ICD-10-CM | POA: Insufficient documentation

## 2019-03-19 DIAGNOSIS — Z79890 Hormone replacement therapy (postmenopausal): Secondary | ICD-10-CM | POA: Insufficient documentation

## 2019-03-19 HISTORY — PX: COLONOSCOPY WITH PROPOFOL: SHX5780

## 2019-03-19 SURGERY — COLONOSCOPY WITH PROPOFOL
Anesthesia: General

## 2019-03-19 MED ORDER — PROPOFOL 500 MG/50ML IV EMUL
INTRAVENOUS | Status: DC | PRN
  Administered 2019-03-19: 100 ug/kg/min via INTRAVENOUS

## 2019-03-19 MED ORDER — SODIUM CHLORIDE 0.9 % IV SOLN
INTRAVENOUS | Status: DC
  Administered 2019-03-19 (×2): via INTRAVENOUS

## 2019-03-19 MED ORDER — PROPOFOL 10 MG/ML IV BOLUS
INTRAVENOUS | Status: DC | PRN
  Administered 2019-03-19: 30 mg via INTRAVENOUS

## 2019-03-19 NOTE — Anesthesia Preprocedure Evaluation (Signed)
Anesthesia Evaluation  Patient identified by MRN, date of birth, ID band Patient awake    Reviewed: Allergy & Precautions, H&P , NPO status , Patient's Chart, lab work & pertinent test results, reviewed documented beta blocker date and time   Airway Mallampati: II   Neck ROM: full    Dental  (+) Poor Dentition   Pulmonary neg pulmonary ROS,    Pulmonary exam normal        Cardiovascular Exercise Tolerance: Good hypertension, negative cardio ROS Normal cardiovascular exam Rhythm:regular Rate:Normal     Neuro/Psych  Headaches, PSYCHIATRIC DISORDERS Anxiety Depression    GI/Hepatic negative GI ROS, Neg liver ROS,   Endo/Other  negative endocrine ROS  Renal/GU Renal disease  negative genitourinary   Musculoskeletal   Abdominal   Peds  Hematology  (+) Blood dyscrasia, anemia ,   Anesthesia Other Findings Past Medical History: No date: Abnormal Pap smear of cervix     Comment:  h/o LSIL pap with HPV +; 01/2019 pap negative negative               HPV No date: Anxiety No date: Atrial fibrillation ([facility]) No date: B12 deficiency No date: Chronic pain syndrome No date: Colon polyps No date: Congenital absence of right kidney No date: Degeneration of lumbar intervertebral disc No date: Depression No date: Early menopause No date: Gastric bypass status for obesity     Comment:  2013, weight 258lb No date: Headache No date: Heart murmur     Comment:  MVP No date: History of chicken pox No date: Hypercholesterolemia No date: Hypertension No date: Iron deficiency 03/02/2019: Iron deficiency anemia No date: Neuropathy No date: Rheumatoid arthritis ([place])     Comment:  as child  No date: Scoliosis of lumbar spine No date: Solitary kidney, congenital     Comment:  pt thinks has left kidney  No date: Thyroid disease     Comment:  in the past  No date: UTI (urinary tract infection) No date: Vitamin D deficiency Past  Surgical History: 2009: AUGMENTATION MAMMAPLASTY; Bilateral 11/04/2014: BACK SURGERY     Comment:  fusion of spine of lumbosacral region 2 rods and 16               screws  No date: BREAST ENHANCEMENT SURGERY 01/06/2018: COLONOSCOPY WITH PROPOFOL; N/A     Comment:  Procedure: COLONOSCOPY WITH PROPOFOL;  Surgeon:               Virgel Manifold, MD;  Location: [facility] ENDOSCOPY;                Service: Endoscopy;  Laterality: N/A; No date: excess skin removal No date: KNEE ARTHROSCOPY; Bilateral     Comment:  x 7 knees b/l (2 surgeries on right knee) total 1 bone               graft and 5 arthroscopy total knee  No date: ROUX-EN-Y PROCEDURE No date: TOTAL KNEE ARTHROPLASTY     Comment:  left 09/21/18 [name] [place] [facility]    Body Mass Index: 27.07 kg/m     Reproductive/Obstetrics negative OB ROS                             Anesthesia Physical Anesthesia Plan  ASA: III  Anesthesia Plan: General   Post-op Pain Management:    Induction:   PONV Risk Score and Plan:   Airway Management  Planned:   Additional Equipment:   Intra-op Plan:   Post-operative Plan:   Informed Consent: I have reviewed the patients History and Physical, chart, labs and discussed the procedure including the risks, benefits and alternatives for the proposed anesthesia with the patient or authorized representative who has indicated his/her understanding and acceptance.     Dental Advisory Given  Plan Discussed with: CRNA  Anesthesia Plan Comments:         Anesthesia Quick Evaluation

## 2019-03-19 NOTE — Anesthesia Post-op Follow-up Note (Signed)
Anesthesia QCDR form completed.        

## 2019-03-19 NOTE — Op Note (Signed)
[facility] Patient Name: Shelby Colon Procedure Date: 03/19/2019 9:18 AM MRN: [id number] Account #: [id number] Date of Birth: 03-03-1966 Admit Type: Outpatient Age: 53 Room: [facility] ENDO ROOM 3 Gender: Female Note Status: Finalized Procedure:            Colonoscopy Indications:          High risk colon cancer surveillance: Personal history                        of colonic polyps Providers:             B. Bonna Gains MD, MD Referring MD:         Nino Glow Mclean-Scocuzza MD, MD (Referring MD) Medicines:            Monitored Anesthesia Care Complications:        No immediate complications. Procedure:            Pre-Anesthesia Assessment:                       - ASA Grade Assessment: II - A patient with mild                        systemic disease.                       - Prior to the procedure, a History and Physical was                        performed, and patient medications, allergies and                        sensitivities were reviewed. The patient's tolerance of                        previous anesthesia was reviewed.                       - The risks and benefits of the procedure and the                        sedation options and risks were discussed with the                        patient. All questions were answered and informed                        consent was obtained.                       - Patient identification and proposed procedure were                        verified prior to the procedure by the physician, the                        nurse, the anesthesiologist, the anesthetist and the                        technician. The procedure was verified in the procedure  room.                       After obtaining informed consent, the colonoscope was                        passed under direct vision. Throughout the procedure,                        the patient's blood pressure, pulse, and oxygen                 saturations were monitored continuously. The                        Colonoscope was introduced through the anus and                        advanced to the the cecum, identified by appendiceal                        orifice and ileocecal valve. The colonoscopy was                        performed with ease. The patient tolerated the                        procedure well. The quality of the bowel preparation                        was good. Findings:      The perianal and digital rectal examinations were normal.      A 4 mm polyp was found in the sigmoid colon. The polyp was sessile. The       polyp was removed with a cold biopsy forceps. Resection and retrieval       were complete.      The exam was otherwise without abnormality.      The rectum, sigmoid colon, descending colon, transverse colon, ascending       colon and cecum appeared normal.      The retroflexed view of the distal rectum and anal verge was normal and       showed no anal or rectal abnormalities. Impression:           - One 4 mm polyp in the sigmoid colon, removed with a                        cold biopsy forceps. Resected and retrieved.                       - The examination was otherwise normal.                       - The rectum, sigmoid colon, descending colon,                        transverse colon, ascending colon and cecum are normal.                       - The distal rectum and anal verge are normal on  retroflexion view. Recommendation:       - Discharge patient to home (with escort).                       - Advance diet as tolerated.                       - Continue present medications.                       - Await pathology results.                       - Repeat colonoscopy in 5 years.                       - The findings and recommendations were discussed with                        the patient.                       - The findings and recommendations were discussed  with                        the patient's family.                       - Return to primary care physician as previously                        scheduled. Procedure Code(s):    --- Professional ---                       [phone], Colonoscopy, flexible; with biopsy, single or                        multiple Diagnosis Code(s):    --- Professional ---                       Z86.010, Personal history of colonic polyps                       K63.5, Polyp of colon CPT copyright 2019 American Medical Association. All rights reserved. The codes documented in this report are preliminary and upon coder review may  be revised to meet current compliance requirements.  Vonda Antigua, MD Margretta Sidle B. Bonna Gains MD, MD 03/19/2019 10:07:23 AM This report has been signed electronically. Number of Addenda: 0 Note Initiated On: 03/19/2019 9:18 AM Scope Withdrawal Time: 0 hours 17 minutes 26 seconds  Total Procedure Duration: 0 hours 25 minutes 31 seconds  Estimated Blood Loss: Estimated blood loss: none.      [facility]

## 2019-03-19 NOTE — Transfer of Care (Signed)
Immediate Anesthesia Transfer of Care Note  Patient: Shelby Colon  Procedure(s) Performed: COLONOSCOPY WITH PROPOFOL (N/A )  Patient Location: PACU  Anesthesia Type:General  Level of Consciousness: awake, alert  and oriented  Airway & Oxygen Therapy: Patient Spontanous Breathing  Post-op Assessment: Report given to RN  Post vital signs: Reviewed and stable  Last Vitals:  Vitals Value Taken Time  BP    Temp    Pulse    Resp    SpO2      Last Pain:  Vitals:   03/19/19 0850  TempSrc: Oral  PainSc: 0-No pain         Complications: No apparent anesthesia complications

## 2019-03-19 NOTE — H&P (Signed)
Vonda Antigua, MD [address] [place], [place], [place], [address] [id number], [place], [place], [place], [address] Phone: [phone]  Fax: [phone]  Primary Care Physician:  McLean-Scocuzza, Nino Glow, MD   Pre-Procedure History & Physical: HPI:  Shelby Colon is a 53 y.o. female is here for a colonoscopy.   Past Medical History:  Diagnosis Date  . Abnormal Pap smear of cervix    h/o LSIL pap with HPV +; 01/2019 pap negative negative HPV  . Anxiety   . Atrial fibrillation ([place])   . B12 deficiency   . Chronic pain syndrome   . Colon polyps   . Congenital absence of right kidney   . Degeneration of lumbar intervertebral disc   . Depression   . Early menopause   . Gastric bypass status for obesity    2013, weight 258lb  . Headache   . Heart murmur    MVP  . History of chicken pox   . Hypercholesterolemia   . Hypertension   . Iron deficiency   . Iron deficiency anemia 03/02/2019  . Neuropathy   . Rheumatoid arthritis ([place])    as child   . Scoliosis of lumbar spine   . Solitary kidney, congenital    pt thinks has left kidney   . Thyroid disease    in the past   . UTI (urinary tract infection)   . Vitamin D deficiency     Past Surgical History:  Procedure Laterality Date  . AUGMENTATION MAMMAPLASTY Bilateral 2009  . BACK SURGERY  11/04/2014   fusion of spine of lumbosacral region 2 rods and 16 screws   . BREAST ENHANCEMENT SURGERY    . COLONOSCOPY WITH PROPOFOL N/A 01/06/2018   Procedure: COLONOSCOPY WITH PROPOFOL;  Surgeon: Virgel Manifold, MD;  Location: [facility] ENDOSCOPY;  Service: Endoscopy;  Laterality: N/A;  . excess skin removal    . KNEE ARTHROSCOPY Bilateral    x 7 knees b/l (2 surgeries on right knee) total 1 bone graft and 5 arthroscopy total knee   . ROUX-EN-Y PROCEDURE    . TOTAL KNEE ARTHROPLASTY     left 09/21/18 [name] [name]    Prior to Admission medications   Medication Sig Start Date End Date Taking?  Authorizing Provider  ALPRAZolam Duanne Moron) 0.25 MG tablet 1 pill 2x per day as needed or 1-2 at night for sleep 01/30/19  Yes McLean-Scocuzza, Nino Glow, MD  bisacodyl (BISACODYL) 5 MG EC tablet Take 2 tablets (10mg ) between 1pm and 3pm the day before your procedure 03/05/19  Yes Virgel Manifold, MD  conjugated estrogens (PREMARIN) vaginal cream 1g vaginally nightly 2 weeks, 1g vaginally every other night 2 weeks, then 1g vaginally twice weekly 02/12/19  Yes Malachy Mood, MD  cyclobenzaprine (FLEXERIL) 10 MG tablet Take 1 tablet (10 mg total) by mouth 3 (three) times daily as needed. 03/01/19  Yes McLean-Scocuzza, Nino Glow, MD  gabapentin (NEURONTIN) 600 MG tablet 1 pill in am 1 pill with lunch and 2 at night 09/04/18  Yes McLean-Scocuzza, Nino Glow, MD  NON FORMULARY Liquid vitamin Miracle 2000   Yes [provider]    Allergies as of 03/05/2019 - Review Complete 02/28/2019  Allergen Reaction Noted  . Morphine Nausea And Vomiting 05/14/2014    Family History  Adopted: Yes  Problem Relation Age of Onset  . Arthritis Mother   . Drug abuse Mother        heriod OD  . Early death Mother   .  Diabetes Father   . Hypertension Father   . Depression Father   . Arthritis Father   . Drug abuse Father   . Diabetes Sister   . Fibromyalgia Sister   . Rashes / Skin problems Sister        PSORIASIS  . Diabetes Brother   . Osteoarthritis Maternal Grandmother   . Aneurysm Maternal Aunt   . Thyroid disease Maternal Aunt   . Fibromyalgia Maternal Aunt   . Breast cancer Maternal Aunt   . Cancer Maternal Aunt        breast cancer     Social History   Socioeconomic History  . Marital status: Married    Spouse name: Not on file  . Number of children: Not on file  . Years of education: Not on file  . Highest education level: Not on file  Occupational History  . Not on file  Social Needs  . Financial resource strain: Not on file  . Food insecurity    Worry: Not on file    Inability:  Not on file  . Transportation needs    Medical: Not on file    Non-medical: Not on file  Tobacco Use  . Smoking status: Never Smoker  . Smokeless tobacco: Never Used  Substance and Sexual Activity  . Alcohol use: Yes    Frequency: Never  . Drug use: No  . Sexual activity: Yes  Lifestyle  . Physical activity    Days per week: Not on file    Minutes per session: Not on file  . Stress: Not on file  Relationships  . Social Herbalist on phone: Not on file    Gets together: Not on file    Attends religious service: Not on file    Active member of club or organization: Not on file    Attends meetings of clubs or organizations: Not on file    Relationship status: Not on file  . Intimate partner violence    Fear of current or ex partner: Not on file    Emotionally abused: Not on file    Physically abused: Not on file    Forced sexual activity: Not on file  Other Topics Concern  . Not on file  Social History Narrative   Married since 2012    No kids    From [place] moved to [facility] and now lives here    Currently unemployed used to be [place] unemployed since 04/2018    BA degree    No guns, wears seat belt, safe in relationship     Review of Systems: See HPI, otherwise negative ROS  Physical Exam: BP (!) 146/103   Pulse 94   Temp 98.2 F (36.8 C) (Oral)   Resp 16   Ht 5\' 2"  (1.575 m)   Wt 67.1 kg   SpO2 99%   BMI 27.07 kg/m  General:   Alert,  pleasant and cooperative in NAD Head:  Normocephalic and atraumatic. Neck:  Supple; no masses or thyromegaly. Lungs:  Clear throughout to auscultation, normal respiratory effort.    Heart:  +S1, +S2, Regular rate and rhythm, No edema. Abdomen:  Soft, nontender and nondistended. Normal bowel sounds, without guarding, and without rebound.   Neurologic:  Alert and  oriented x4;  grossly normal neurologically.  Impression/Plan: Shelby Colon is here for a colonoscopy  to be performed for polyp surveillance  Risks, benefits, limitations, and alternatives regarding  colonoscopy  have been reviewed with the patient.  Questions have been answered.  All parties agreeable.   Virgel Manifold, MD  03/19/2019, 9:28 AM

## 2019-03-20 LAB — SURGICAL PATHOLOGY

## 2019-03-21 ENCOUNTER — Other Ambulatory Visit: Payer: Self-pay

## 2019-03-22 ENCOUNTER — Other Ambulatory Visit: Payer: Self-pay

## 2019-03-22 ENCOUNTER — Inpatient Hospital Stay: Payer: 59

## 2019-03-22 VITALS — BP 128/80 | HR 83 | Resp 18

## 2019-03-22 DIAGNOSIS — D509 Iron deficiency anemia, unspecified: Secondary | ICD-10-CM

## 2019-03-22 MED ORDER — SODIUM CHLORIDE 0.9 % IV SOLN: 200.0000 mg | Freq: Once | INTRAVENOUS | Status: DC

## 2019-03-22 MED ORDER — SODIUM CHLORIDE 0.9 % IV SOLN
Freq: Once | INTRAVENOUS | Status: AC
  Administered 2019-03-22: 14:00:00 via INTRAVENOUS
  Filled 2019-03-22: qty 250

## 2019-03-22 MED ORDER — IRON SUCROSE 20 MG/ML IV SOLN
200.0000 mg | Freq: Once | INTRAVENOUS | Status: AC
  Administered 2019-03-22: 200 mg via INTRAVENOUS
  Filled 2019-03-22: qty 10

## 2019-03-26 ENCOUNTER — Encounter: Payer: Self-pay | Admitting: Gastroenterology

## 2019-03-27 NOTE — Anesthesia Postprocedure Evaluation (Signed)
Anesthesia Post Note  Patient: Shelby Colon  Procedure(s) Performed: COLONOSCOPY WITH PROPOFOL (N/A )  Patient location during evaluation: PACU Anesthesia Type: General Level of consciousness: awake and alert Pain management: pain level controlled Vital Signs Assessment: post-procedure vital signs reviewed and stable Respiratory status: spontaneous breathing, nonlabored ventilation, respiratory function stable and patient connected to nasal cannula oxygen Cardiovascular status: blood pressure returned to baseline and stable Postop Assessment: no apparent nausea or vomiting Anesthetic complications: no     Last Vitals:  Vitals:   03/19/19 1019 03/19/19 1029  BP: 125/77 130/83  Pulse: 87 80  Resp: 13 12  Temp:    SpO2: 100% 100%    Last Pain:  Vitals:   03/20/19 0752  TempSrc:   PainSc: 0-No pain                 Molli Barrows

## 2019-03-29 ENCOUNTER — Inpatient Hospital Stay: Payer: 59

## 2019-03-29 ENCOUNTER — Other Ambulatory Visit: Payer: Self-pay

## 2019-03-29 VITALS — BP 165/100 | HR 95 | Temp 97.7°F | Resp 18

## 2019-03-29 DIAGNOSIS — D509 Iron deficiency anemia, unspecified: Secondary | ICD-10-CM | POA: Diagnosis not present

## 2019-03-29 MED ORDER — IRON SUCROSE 20 MG/ML IV SOLN
200.0000 mg | Freq: Once | INTRAVENOUS | Status: AC
  Administered 2019-03-29: 200 mg via INTRAVENOUS

## 2019-03-29 MED ORDER — SODIUM CHLORIDE 0.9 % IV SOLN
Freq: Once | INTRAVENOUS | Status: AC
  Administered 2019-03-29: 14:00:00 via INTRAVENOUS
  Filled 2019-03-29: qty 250

## 2019-03-29 MED ORDER — DIAZEPAM 5 MG PO TABS: 5.0000 mg | ORAL_TABLET | Freq: Once | ORAL | Status: DC

## 2019-03-29 MED ORDER — SODIUM CHLORIDE 0.9 % IV SOLN: 200.0000 mg | Freq: Once | INTRAVENOUS | Status: DC

## 2019-04-09 ENCOUNTER — Encounter: Payer: Self-pay | Admitting: Obstetrics and Gynecology

## 2019-04-09 ENCOUNTER — Other Ambulatory Visit: Payer: Self-pay

## 2019-04-09 ENCOUNTER — Ambulatory Visit (INDEPENDENT_AMBULATORY_CARE_PROVIDER_SITE_OTHER): Payer: 59 | Admitting: Obstetrics and Gynecology

## 2019-04-09 VITALS — BP 152/96 | HR 92 | Ht 62.0 in | Wt 146.0 lb

## 2019-04-09 DIAGNOSIS — N941 Unspecified dyspareunia: Secondary | ICD-10-CM

## 2019-04-09 DIAGNOSIS — N952 Postmenopausal atrophic vaginitis: Secondary | ICD-10-CM

## 2019-04-09 MED ORDER — PREMARIN 0.625 MG/GM VA CREA: 1.0000 | TOPICAL_CREAM | VAGINAL | 3 refills | Status: DC

## 2019-04-09 NOTE — Progress Notes (Signed)
Obstetrics & Gynecology Office Visit   Chief Complaint:  Chief Complaint  Patient presents with  . Follow-up    Premarin cream    History of Present Illness: 53 y.o. No obstetric history on file. presenting for medication follow up for a diagnosis of dyspareunia secondary to postmenopausal atrophy.  She is currently being managed with vaginal HRT.   The patient reports good control of symptoms on her current regimen.  On her current medication regimen has not had menstrual issues secondary to postmenopausal status.   She has not noted any side-effects or new symptoms.    Review of Systems: Review of Systems  Constitutional: Negative.   Genitourinary: Negative.   Skin: Negative.    Past Medical History:  Past Medical History:  Diagnosis Date  . Abnormal Pap smear of cervix    h/o LSIL pap with HPV +; 01/2019 pap negative negative HPV  . Anxiety   . Atrial fibrillation ([place])   . B12 deficiency   . Chronic pain syndrome   . Colon polyps   . Congenital absence of right kidney   . Degeneration of lumbar intervertebral disc   . Depression   . Early menopause   . Gastric bypass status for obesity    2013, weight 258lb  . Headache   . Heart murmur    MVP  . History of chicken pox   . Hypercholesterolemia   . Hypertension   . Iron deficiency   . Iron deficiency anemia 03/02/2019  . Neuropathy   . Rheumatoid arthritis ([place])    as child   . Scoliosis of lumbar spine   . Solitary kidney, congenital    pt thinks has left kidney   . Thyroid disease    in the past   . UTI (urinary tract infection)   . Vitamin D deficiency     Past Surgical History:  Past Surgical History:  Procedure Laterality Date  . AUGMENTATION MAMMAPLASTY Bilateral 2009  . BACK SURGERY  11/04/2014   fusion of spine of lumbosacral region 2 rods and 16 screws   . BREAST ENHANCEMENT SURGERY    . COLONOSCOPY WITH PROPOFOL N/A 01/06/2018   Procedure: COLONOSCOPY WITH PROPOFOL;  Surgeon: Virgel Manifold, MD;  Location: [facility] ENDOSCOPY;  Service: Endoscopy;  Laterality: N/A;  . COLONOSCOPY WITH PROPOFOL N/A 03/19/2019   Procedure: COLONOSCOPY WITH PROPOFOL;  Surgeon: Virgel Manifold, MD;  Location: [facility] ENDOSCOPY;  Service: Endoscopy;  Laterality: N/A;  . excess skin removal    . KNEE ARTHROSCOPY Bilateral    x 7 knees b/l (2 surgeries on right knee) total 1 bone graft and 5 arthroscopy total knee   . ROUX-EN-Y PROCEDURE    . TOTAL KNEE ARTHROPLASTY     left 09/21/18 [name] [name]    Gynecologic History: No LMP recorded. Patient is postmenopausal.  Obstetric History: No obstetric history on file.  Family History:  Family History  Adopted: Yes  Problem Relation Age of Onset  . Arthritis Mother   . Drug abuse Mother        heriod OD  . Early death Mother   . Diabetes Father   . Hypertension Father   . Depression Father   . Arthritis Father   . Drug abuse Father   . Diabetes Sister   . Fibromyalgia Sister   . Rashes / Skin problems Sister        PSORIASIS  . Diabetes Brother   . Osteoarthritis Maternal  Grandmother   . Aneurysm Maternal Aunt   . Thyroid disease Maternal Aunt   . Fibromyalgia Maternal Aunt   . Breast cancer Maternal Aunt   . Cancer Maternal Aunt        breast cancer     Social History:  Social History   Socioeconomic History  . Marital status: Married    Spouse name: Not on file  . Number of children: Not on file  . Years of education: Not on file  . Highest education level: Not on file  Occupational History  . Not on file  Social Needs  . Financial resource strain: Not on file  . Food insecurity    Worry: Not on file    Inability: Not on file  . Transportation needs    Medical: Not on file    Non-medical: Not on file  Tobacco Use  . Smoking status: Never Smoker  . Smokeless tobacco: Never Used  Substance and Sexual Activity  . Alcohol use: Yes    Frequency: Never  . Drug use: No  . Sexual activity: Yes   Lifestyle  . Physical activity    Days per week: Not on file    Minutes per session: Not on file  . Stress: Not on file  Relationships  . Social Herbalist on phone: Not on file    Gets together: Not on file    Attends religious service: Not on file    Active member of club or organization: Not on file    Attends meetings of clubs or organizations: Not on file    Relationship status: Not on file  . Intimate partner violence    Fear of current or ex partner: Not on file    Emotionally abused: Not on file    Physically abused: Not on file    Forced sexual activity: Not on file  Other Topics Concern  . Not on file  Social History Narrative   Married since 2012    No kids    From [place] moved to [facility] and now lives here    Currently unemployed used to be [place] unemployed since 04/2018    BA degree    No guns, wears seat belt, safe in relationship     Allergies:  Allergies  Allergen Reactions  . Morphine Nausea And Vomiting    Other reaction(s): Other (See Comments)    Medications: Prior to Admission medications   Medication Sig Start Date End Date Taking? Authorizing Provider  ALPRAZolam Duanne Moron) 0.25 MG tablet 1 pill 2x per day as needed or 1-2 at night for sleep 01/30/19  Yes McLean-Scocuzza, Nino Glow, MD  bisacodyl (BISACODYL) 5 MG EC tablet Take 2 tablets (10mg ) between 1pm and 3pm the day before your procedure 03/05/19  Yes Virgel Manifold, MD  cyclobenzaprine (FLEXERIL) 10 MG tablet Take 1 tablet (10 mg total) by mouth 3 (three) times daily as needed. 03/01/19  Yes McLean-Scocuzza, Nino Glow, MD  gabapentin (NEURONTIN) 600 MG tablet 1 pill in am 1 pill with lunch and 2 at night 09/04/18  Yes McLean-Scocuzza, Nino Glow, MD  NON FORMULARY Liquid vitamin Miracle 2000   Yes [provider]  conjugated estrogens (PREMARIN) vaginal cream Place 1 Applicatorful vaginally 2 (two) times a week. 04/09/19    Malachy Mood, MD    Physical Exam Vitals:  Vitals:   04/09/19 1111  BP: (!) 152/96  Pulse: 92   No LMP recorded.  Patient is postmenopausal.  General: NAD, well nourished, appears stated age 18: normocephalic, anicteric Pulmonary: No increased work of breathing Genitourinary:  External: Normal external female genitalia.  Normal urethral meatus, normal  Bartholin's and Skene's glands.    Vagina: Normal vaginal mucosa, no evidence of prolapse.  Good estrogen effect noted  Rectal: deferred  Lymphatic: no evidence of inguinal lymphadenopathy Extremities: no edema, erythema, or tenderness Neurologic: Grossly intact Psychiatric: mood appropriate, affect full  Female chaperone present for pelvic  portions of the physical exam  Assessment: 53 y.o. follow up vaginal HRT  Plan: Problem List Items Addressed This Visit    None    Visit Diagnoses    Vaginal atrophy    -  Primary   Dyspareunia, female         - Good improvement in symptoms on premarin vaginal cream - incidentally feels improvement in libido as well although we still discussed Addyi as additional pharmacotherapy if issues with libido - Return in about 11 months (around 03/08/2020) for annual.   Malachy Mood, MD, [place], [facility] 04/09/2019, 11:41 AM

## 2019-04-10 ENCOUNTER — Ambulatory Visit
Admission: RE | Admit: 2019-04-10 | Discharge: 2019-04-10 | Disposition: A | Discharge status: Home / Self Care | Payer: 59 | Source: Ambulatory Visit | Attending: Internal Medicine | Admitting: Internal Medicine

## 2019-04-10 DIAGNOSIS — Z1231 Encounter for screening mammogram for malignant neoplasm of breast: Secondary | ICD-10-CM | POA: Diagnosis present

## 2019-04-11 ENCOUNTER — Encounter: Payer: Self-pay | Admitting: Internal Medicine

## 2019-04-13 ENCOUNTER — Other Ambulatory Visit: Payer: Self-pay | Admitting: Internal Medicine

## 2019-04-13 ENCOUNTER — Encounter: Payer: Self-pay | Admitting: Internal Medicine

## 2019-04-13 DIAGNOSIS — G629 Polyneuropathy, unspecified: Secondary | ICD-10-CM

## 2019-04-13 DIAGNOSIS — M549 Dorsalgia, unspecified: Secondary | ICD-10-CM

## 2019-04-13 DIAGNOSIS — G8929 Other chronic pain: Secondary | ICD-10-CM

## 2019-04-13 MED ORDER — GABAPENTIN 600 MG PO TABS: ORAL_TABLET | ORAL | 5 refills | Status: DC

## 2019-04-29 ENCOUNTER — Encounter: Payer: Self-pay | Admitting: Oncology

## 2019-04-30 ENCOUNTER — Telehealth: Payer: Self-pay | Admitting: *Deleted

## 2019-04-30 ENCOUNTER — Inpatient Hospital Stay: Payer: 59

## 2019-04-30 NOTE — Telephone Encounter (Signed)
Pt communicated with Heather S via [place]. Appointments will be rescheduled.

## 2019-04-30 NOTE — Telephone Encounter (Signed)
Patient called reporting that she has a bad head cole and is wondering f she is alright to come in for her appointment today for lab only. Please advise

## 2019-05-01 ENCOUNTER — Inpatient Hospital Stay: Payer: 59 | Admitting: Oncology

## 2019-05-01 ENCOUNTER — Inpatient Hospital Stay: Payer: 59

## 2019-05-01 ENCOUNTER — Encounter: Payer: Self-pay | Admitting: Internal Medicine

## 2019-05-01 ENCOUNTER — Other Ambulatory Visit: Payer: Self-pay | Admitting: Internal Medicine

## 2019-05-01 DIAGNOSIS — F419 Anxiety disorder, unspecified: Secondary | ICD-10-CM

## 2019-05-01 DIAGNOSIS — G4700 Insomnia, unspecified: Secondary | ICD-10-CM

## 2019-05-01 DIAGNOSIS — F329 Major depressive disorder, single episode, unspecified: Secondary | ICD-10-CM

## 2019-05-01 DIAGNOSIS — F32A Depression, unspecified: Secondary | ICD-10-CM

## 2019-05-01 MED ORDER — ALPRAZOLAM 0.25 MG PO TABS: ORAL_TABLET | ORAL | 2 refills | Status: DC

## 2019-05-01 NOTE — Telephone Encounter (Signed)
Last OV: 02/23/19  Last Refill:  01/30/19

## 2019-05-28 ENCOUNTER — Other Ambulatory Visit: Payer: Self-pay

## 2019-05-28 ENCOUNTER — Inpatient Hospital Stay: Payer: 59 | Attending: Oncology

## 2019-05-28 DIAGNOSIS — D649 Anemia, unspecified: Secondary | ICD-10-CM

## 2019-05-28 DIAGNOSIS — Z9884 Bariatric surgery status: Secondary | ICD-10-CM | POA: Insufficient documentation

## 2019-05-28 DIAGNOSIS — R5382 Chronic fatigue, unspecified: Secondary | ICD-10-CM | POA: Insufficient documentation

## 2019-05-28 DIAGNOSIS — D509 Iron deficiency anemia, unspecified: Secondary | ICD-10-CM | POA: Insufficient documentation

## 2019-05-28 LAB — CBC WITH DIFFERENTIAL/PLATELET
Abs Immature Granulocytes: 0.02 10*3/uL (ref 0.00–0.07)
Basophils Absolute: 0.1 10*3/uL (ref 0.0–0.1)
Basophils Relative: 1 %
Eosinophils Absolute: 0.2 10*3/uL (ref 0.0–0.5)
Eosinophils Relative: 3 %
HCT: 38.3 % (ref 36.0–46.0)
Hemoglobin: 11.2 g/dL — ABNORMAL LOW (ref 12.0–15.0)
Immature Granulocytes: 0 %
Lymphocytes Relative: 31 %
Lymphs Abs: 2.1 10*3/uL (ref 0.7–4.0)
MCH: 25.8 pg — ABNORMAL LOW (ref 26.0–34.0)
MCHC: 29.2 g/dL — ABNORMAL LOW (ref 30.0–36.0)
MCV: 88.2 fL (ref 80.0–100.0)
Monocytes Absolute: 0.5 10*3/uL (ref 0.1–1.0)
Monocytes Relative: 7 %
Neutro Abs: 3.8 10*3/uL (ref 1.7–7.7)
Neutrophils Relative %: 58 %
Platelets: 328 10*3/uL (ref 150–400)
RBC: 4.34 MIL/uL (ref 3.87–5.11)
RDW: 15.6 % — ABNORMAL HIGH (ref 11.5–15.5)
WBC: 6.6 10*3/uL (ref 4.0–10.5)
nRBC: 0 % (ref 0.0–0.2)

## 2019-05-28 LAB — IRON AND TIBC
Iron: 73 ug/dL (ref 28–170)
Saturation Ratios: 25 % (ref 10.4–31.8)
TIBC: 290 ug/dL (ref 250–450)
UIBC: 217 ug/dL

## 2019-05-28 LAB — FERRITIN: Ferritin: 67 ng/mL (ref 11–307)

## 2019-05-28 NOTE — Progress Notes (Signed)
Patient pre screened for office appointment, no questions or concerns today. Patient reminded of upcoming appointment time and date. 

## 2019-05-29 ENCOUNTER — Inpatient Hospital Stay (HOSPITAL_BASED_OUTPATIENT_CLINIC_OR_DEPARTMENT_OTHER): Payer: 59 | Admitting: Oncology

## 2019-05-29 ENCOUNTER — Encounter: Payer: Self-pay | Admitting: Oncology

## 2019-05-29 ENCOUNTER — Inpatient Hospital Stay: Payer: 59

## 2019-05-29 ENCOUNTER — Other Ambulatory Visit: Payer: Self-pay

## 2019-05-29 VITALS — BP 140/86 | HR 98 | Temp 97.2°F | Wt 143.2 lb

## 2019-05-29 DIAGNOSIS — Z9884 Bariatric surgery status: Secondary | ICD-10-CM

## 2019-05-29 DIAGNOSIS — D509 Iron deficiency anemia, unspecified: Secondary | ICD-10-CM | POA: Diagnosis not present

## 2019-05-29 NOTE — Progress Notes (Signed)
Hematology/Oncology follow up note [facility] [phone] [phone]   Patient Care Team: McLean-Scocuzza, Nino Glow, MD as PCP - General (Internal Medicine)  REFERRING PROVIDER: McLean-Scocuzza, Olivia Mackie *  CHIEF COMPLAINTS/REASON FOR VISIT:  Evaluation of thrombocytosis  HISTORY OF PRESENTING ILLNESS:  Shelby Colon is a 53 y.o. female who was seen in consultation at the request of McLean-Scocuzza, Olivia Mackie * for evaluation of thromcbocytosis Reviewed patient's labs.  02/15/2019 Labs showed elevated platelet counts at 453, Hb 11.3,  wbc 7.3.   Reviewed patient's previous labs. Thrombocytosis onset is chronic onset , duration since 2019.  No aggravating or elevated factors. Associated symptoms or signs:  Denies weight loss, fever, chills, fatigue, night sweats.  Context:  Smoking history: never smoker Family history of polycythemia. denies History of iron deficiency anemia. Yes.  History of DVT, denies History of gastric bypass. -  INTERVAL HISTORY Shelby Colon is a 53 y.o. female who has above history reviewed by me today presents for follow up visit for management of iron deficiency anemia, thrombocytosis.  Problems and complaints are listed below: Patient has received IV iron treatments during the interval.  She reports improved energy level. No new complaints today. Chronic fatigue at baseline. Had a colonoscopy 03/19/2019.   Review of Systems  Constitutional: Positive for fatigue. Negative for appetite change, chills and fever.  HENT:   Negative for hearing loss and voice change.   Eyes: Negative for eye problems.  Respiratory: Negative for chest tightness and cough.   Cardiovascular: Negative for chest pain.  Gastrointestinal: Negative for abdominal distention, abdominal pain and blood in stool.  Endocrine: Negative for hot flashes.  Genitourinary: Negative for difficulty urinating and frequency.   Musculoskeletal: Negative  for arthralgias.  Skin: Negative for itching and rash.  Neurological: Negative for extremity weakness.  Hematological: Negative for adenopathy.  Psychiatric/Behavioral: Negative for confusion.    MEDICAL HISTORY:  Past Medical History:  Diagnosis Date  . Abnormal Pap smear of cervix    h/o LSIL pap with HPV +; 01/2019 pap negative negative HPV  . Anxiety   . Atrial fibrillation ([place])   . B12 deficiency   . Chronic pain syndrome   . Colon polyps   . Congenital absence of right kidney   . Degeneration of lumbar intervertebral disc   . Depression   . Early menopause   . Gastric bypass status for obesity    2013, weight 258lb  . Headache   . Heart murmur    MVP  . History of chicken pox   . Hypercholesterolemia   . Hypertension   . Iron deficiency   . Iron deficiency anemia 03/02/2019  . Neuropathy   . Rheumatoid arthritis ([place])    as child   . Scoliosis of lumbar spine   . Solitary kidney, congenital    pt thinks has left kidney   . Thyroid disease    in the past   . UTI (urinary tract infection)   . Vitamin D deficiency     SURGICAL HISTORY: Past Surgical History:  Procedure Laterality Date  . AUGMENTATION MAMMAPLASTY Bilateral 2009  . BACK SURGERY  11/04/2014   fusion of spine of lumbosacral region 2 rods and 16 screws   . BREAST ENHANCEMENT SURGERY    . COLONOSCOPY WITH PROPOFOL N/A 01/06/2018   Procedure: COLONOSCOPY WITH PROPOFOL;  Surgeon: Virgel Manifold, MD;  Location: [facility] ENDOSCOPY;  Service: Endoscopy;  Laterality: N/A;  . COLONOSCOPY WITH PROPOFOL N/A 03/19/2019   Procedure: COLONOSCOPY  WITH PROPOFOL;  Surgeon: Virgel Manifold, MD;  Location: [facility] ENDOSCOPY;  Service: Endoscopy;  Laterality: N/A;  . excess skin removal    . KNEE ARTHROSCOPY Bilateral    x 7 knees b/l (2 surgeries on right knee) total 1 bone graft and 5 arthroscopy total knee   . ROUX-EN-Y PROCEDURE    . TOTAL KNEE ARTHROPLASTY     left 09/21/18 [name] [name]     SOCIAL HISTORY: Social History   Socioeconomic History  . Marital status: Married    Spouse name: Not on file  . Number of children: Not on file  . Years of education: Not on file  . Highest education level: Not on file  Occupational History  . Not on file  Tobacco Use  . Smoking status: Never Smoker  . Smokeless tobacco: Never Used  Substance and Sexual Activity  . Alcohol use: Yes  . Drug use: No  . Sexual activity: Yes  Other Topics Concern  . Not on file  Social History Narrative   Married since 2012    No kids    From [place] moved to [facility] and now lives here    Currently unemployed used to be [place] unemployed since 04/2018    BA degree    No guns, wears seat belt, safe in relationship    Social Determinants of Health   Financial Resource Strain:   . Difficulty of Paying Living Expenses: Not on file  Food Insecurity:   . Worried About Charity fundraiser in the Last Year: Not on file  . Ran Out of Food in the Last Year: Not on file  Transportation Needs:   . Lack of Transportation (Medical): Not on file  . Lack of Transportation (Non-Medical): Not on file  Physical Activity:   . Days of Exercise per Week: Not on file  . Minutes of Exercise per Session: Not on file  Stress:   . Feeling of Stress : Not on file  Social Connections:   . Frequency of Communication with Friends and Family: Not on file  . Frequency of Social Gatherings with Friends and Family: Not on file  . Attends Religious Services: Not on file  . Active Member of Clubs or Organizations: Not on file  . Attends Archivist Meetings: Not on file  . Marital Status: Not on file  Intimate Partner Violence:   . Fear of Current or Ex-Partner: Not on file  . Emotionally Abused: Not on file  . Physically Abused: Not on file  . Sexually Abused: Not on file    FAMILY HISTORY: Family History  Adopted: Yes  Problem Relation Age of  Onset  . Arthritis Mother   . Drug abuse Mother        heriod OD  . Early death Mother   . Diabetes Father   . Hypertension Father   . Depression Father   . Arthritis Father   . Drug abuse Father   . Diabetes Sister   . Fibromyalgia Sister   . Rashes / Skin problems Sister        PSORIASIS  . Diabetes Brother   . Osteoarthritis Maternal Grandmother   . Aneurysm Maternal Aunt   . Thyroid disease Maternal Aunt   . Fibromyalgia Maternal Aunt   . Breast cancer Maternal Aunt   . Cancer Maternal Aunt        breast cancer     ALLERGIES:  is  allergic to morphine.  MEDICATIONS:  Current Outpatient Medications  Medication Sig Dispense Refill  . ALPRAZolam (XANAX) 0.25 MG tablet 1 pill 2x per day as needed or 1-2 at night for sleep 60 tablet 2  . conjugated estrogens (PREMARIN) vaginal cream Place 1 Applicatorful vaginally 2 (two) times a week. 30 g 3  . cyclobenzaprine (FLEXERIL) 10 MG tablet Take 1 tablet (10 mg total) by mouth 3 (three) times daily as needed. 90 tablet 2  . gabapentin (NEURONTIN) 600 MG tablet 1 pill in am 1 pill with lunch and 2 at night 120 tablet 5  . NON FORMULARY Liquid vitamin Miracle 2000     No current facility-administered medications for this visit.     PHYSICAL EXAMINATION: ECOG PERFORMANCE STATUS: 1 - Symptomatic but completely ambulatory Vitals:   05/29/19 1322  BP: 140/86  Pulse: 98  Temp: (!) 97.2 F (36.2 C)   Filed Weights   05/29/19 1322  Weight: 143 lb 3.2 oz (65 kg)    Physical Exam Constitutional:      General: She is not in acute distress. HENT:     Head: Normocephalic and atraumatic.  Eyes:     General: No scleral icterus.    Pupils: Pupils are equal, round, and reactive to light.  Cardiovascular:     Rate and Rhythm: Normal rate and regular rhythm.     Heart sounds: Normal heart sounds.  Pulmonary:     Effort: Pulmonary effort is normal.  Abdominal:     General: Bowel sounds are normal. There is no distension.      Palpations: Abdomen is soft. There is no mass.     Tenderness: There is no abdominal tenderness.  Musculoskeletal:        General: No deformity. Normal range of motion.     Cervical back: Normal range of motion and neck supple.  Skin:    General: Skin is warm and dry.     Findings: No erythema or rash.  Neurological:     Mental Status: She is alert and oriented to person, place, and time.     Cranial Nerves: No cranial nerve deficit.     Coordination: Coordination normal.  Psychiatric:        Mood and Affect: Mood normal.        Judgment: Judgment normal.      LABORATORY DATA:  I have reviewed the data as listed Lab Results  Component Value Date   WBC 6.6 05/28/2019   HGB 11.2 (L) 05/28/2019   HCT 38.3 05/28/2019   MCV 88.2 05/28/2019   PLT 328 05/28/2019   Recent Labs    02/15/19 1004  NA 139  K 4.2  CL 99  CO2 24  GLUCOSE 86  BUN 8  CREATININE 0.87  CALCIUM 8.9  GFRNONAA 77  GFRAA 89  PROT 6.7  ALBUMIN 4.1  AST 17  ALT 7  ALKPHOS 127*  BILITOT 0.3   Iron/TIBC/Ferritin/ %Sat    Component Value Date/Time   IRON 73 05/28/2019 1328   IRON 35 02/15/2019 1004   TIBC 290 05/28/2019 1328   TIBC 286 02/15/2019 1004   FERRITIN 67 05/28/2019 1328   FERRITIN 38 02/15/2019 1004   IRONPCTSAT 25 05/28/2019 1328   IRONPCTSAT 12 (L) 02/15/2019 1004   IRONPCTSAT 31 12/05/2017 1044      RADIOGRAPHIC STUDIES: I have personally reviewed the radiological images as listed and agreed with the findings in the report. No results found.    ASSESSMENT &  PLAN:  1. Iron deficiency anemia, unspecified iron deficiency anemia type   2. History of Roux-en-Y gastric bypass   Labs reviewed and discussed with patient .  Hemoglobin 11.2, has improved from 9.7.  Close to her baseline. No need for IV iron at this point. Given her history of gastric bypass, she is at high risk of developing iron deficiency.  I recommend patient to check blood work and follow-up with me every 6  months for maintenance IV iron. She agrees with the plan.   Orders Placed This Encounter  Procedures  . CBC with Differential/Platelet    Standing Status:   Future    Standing Expiration Date:   05/28/2020  . Ferritin    Standing Status:   Future    Standing Expiration Date:   05/28/2020  . Iron and TIBC    Standing Status:   Future    Standing Expiration Date:   05/28/2020    All questions were answered. The patient knows to call the clinic with any problems questions or concerns.  Cc McLean-Scocuzza, Olivia Mackie *  Return of visit: 6 months  Earlie Server, MD, PhD 05/29/2019

## 2019-05-29 NOTE — Progress Notes (Signed)
Patient does not offer any problems today.  

## 2019-05-30 ENCOUNTER — Other Ambulatory Visit: Payer: Self-pay | Admitting: Internal Medicine

## 2019-05-30 DIAGNOSIS — G8929 Other chronic pain: Secondary | ICD-10-CM

## 2019-05-30 MED ORDER — CYCLOBENZAPRINE HCL 10 MG PO TABS: 10.0000 mg | ORAL_TABLET | Freq: Three times a day (TID) | ORAL | 2 refills | Status: DC | PRN

## 2019-06-26 ENCOUNTER — Encounter: Payer: Self-pay | Admitting: Internal Medicine

## 2019-06-27 NOTE — Telephone Encounter (Signed)
Pt called in and appt was made for 06/27/19 @ 1pm.

## 2019-07-02 ENCOUNTER — Other Ambulatory Visit: Payer: Self-pay | Admitting: Internal Medicine

## 2019-07-02 DIAGNOSIS — R7989 Other specified abnormal findings of blood chemistry: Secondary | ICD-10-CM | POA: Insufficient documentation

## 2019-07-02 DIAGNOSIS — M25559 Pain in unspecified hip: Secondary | ICD-10-CM

## 2019-07-02 DIAGNOSIS — M2550 Pain in unspecified joint: Secondary | ICD-10-CM

## 2019-07-02 DIAGNOSIS — M179 Osteoarthritis of knee, unspecified: Secondary | ICD-10-CM

## 2019-07-02 DIAGNOSIS — M1710 Unilateral primary osteoarthritis, unspecified knee: Secondary | ICD-10-CM

## 2019-07-02 DIAGNOSIS — G894 Chronic pain syndrome: Secondary | ICD-10-CM

## 2019-07-02 DIAGNOSIS — G8929 Other chronic pain: Secondary | ICD-10-CM

## 2019-07-04 ENCOUNTER — Ambulatory Visit: Payer: 59 | Admitting: Internal Medicine

## 2019-07-05 ENCOUNTER — Telehealth: Payer: Self-pay | Admitting: Internal Medicine

## 2019-07-05 NOTE — Telephone Encounter (Signed)
Pt needs all labs from 11/2017 to present faxed Dr. Amil Amen rheumatology [place]  tMS

## 2019-07-10 NOTE — Telephone Encounter (Signed)
Labs have been sent electronically.

## 2019-07-18 ENCOUNTER — Ambulatory Visit: Payer: 59 | Admitting: Internal Medicine

## 2019-07-18 ENCOUNTER — Other Ambulatory Visit: Payer: Self-pay

## 2019-07-18 ENCOUNTER — Encounter: Payer: Self-pay | Admitting: Internal Medicine

## 2019-07-18 VITALS — BP 124/82 | HR 99 | Temp 98.0°F | Ht 62.0 in | Wt 152.6 lb

## 2019-07-18 DIAGNOSIS — H43399 Other vitreous opacities, unspecified eye: Secondary | ICD-10-CM

## 2019-07-18 DIAGNOSIS — G629 Polyneuropathy, unspecified: Secondary | ICD-10-CM

## 2019-07-18 DIAGNOSIS — G4700 Insomnia, unspecified: Secondary | ICD-10-CM

## 2019-07-18 DIAGNOSIS — F419 Anxiety disorder, unspecified: Secondary | ICD-10-CM

## 2019-07-18 DIAGNOSIS — W19XXXA Unspecified fall, initial encounter: Secondary | ICD-10-CM | POA: Insufficient documentation

## 2019-07-18 DIAGNOSIS — F329 Major depressive disorder, single episode, unspecified: Secondary | ICD-10-CM

## 2019-07-18 DIAGNOSIS — F32A Depression, unspecified: Secondary | ICD-10-CM

## 2019-07-18 DIAGNOSIS — M2550 Pain in unspecified joint: Secondary | ICD-10-CM

## 2019-07-18 MED ORDER — DULOXETINE HCL 20 MG PO CPEP: 20.0000 mg | ORAL_CAPSULE | Freq: Every day | ORAL | 2 refills | Status: DC

## 2019-07-18 MED ORDER — ALPRAZOLAM 0.25 MG PO TABS: ORAL_TABLET | ORAL | 2 refills | Status: DC

## 2019-07-18 NOTE — Patient Instructions (Addendum)
Let me know about therapy if referral needed  Will send to Dr. Manuella Ghazi neurology to do nerve conduction study consider scrambler therapy   Petra Kuba made or nature wise tumeric with ginger/curcumen Glucosamine chrondroitin nature made  Alpha lipoic acid 600 mg bid   Duloxetine delayed-release capsules What is this medicine? DULOXETINE (doo LOX e teen) is used to treat depression, anxiety, and different types of chronic pain. This medicine may be used for other purposes; ask your health care provider or pharmacist if you have questions. COMMON BRAND NAME(S): Cymbalta, Creig Hines, Irenka What should I tell my health care provider before I take this medicine? They need to know if you have any of these conditions:  bipolar disorder  glaucoma  high blood pressure  kidney disease  liver disease  seizures  suicidal thoughts, plans or attempt; a previous suicide attempt by you or a family member  take medicines that treat or prevent blood clots  taken medicines called MAOIs like Carbex, Eldepryl, Marplan, Nardil, and Parnate within 14 days  trouble passing urine  an unusual reaction to duloxetine, other medicines, foods, dyes, or preservatives  pregnant or trying to get pregnant  breast-feeding How should I use this medicine? Take this medicine by mouth with a glass of water. Follow the directions on the prescription label. Do not crush, cut or chew some capsules of this medicine. Some capsules may be opened and sprinkled on applesauce. Check with your doctor or pharmacist if you are not sure. You can take this medicine with or without food. Take your medicine at regular intervals. Do not take your medicine more often than directed. Do not stop taking this medicine suddenly except upon the advice of your doctor. Stopping this medicine too quickly may cause serious side effects or your condition may worsen. A special MedGuide will be given to you by the pharmacist with each prescription and  refill. Be sure to read this information carefully each time. Talk to your pediatrician regarding the use of this medicine in children. While this drug may be prescribed for children as young as 43 years of age for selected conditions, precautions do apply. Overdosage: If you think you have taken too much of this medicine contact a poison control center or emergency room at once. NOTE: This medicine is only for you. Do not share this medicine with others. What if I miss a dose? If you miss a dose, take it as soon as you can. If it is almost time for your next dose, take only that dose. Do not take double or extra doses. What may interact with this medicine? Do not take this medicine with any of the following medications:  desvenlafaxine  levomilnacipran  linezolid  MAOIs like Carbex, Eldepryl, Marplan, Nardil, and Parnate  methylene blue (injected into a vein)  milnacipran  thioridazine  venlafaxine This medicine may also interact with the following medications:  alcohol  amphetamines  aspirin and aspirin-like medicines  certain antibiotics like ciprofloxacin and enoxacin  certain medicines for blood pressure, heart disease, irregular heart beat  certain medicines for depression, anxiety, or psychotic disturbances  certain medicines for migraine headache like almotriptan, eletriptan, frovatriptan, naratriptan, rizatriptan, sumatriptan, zolmitriptan  certain medicines that treat or prevent blood clots like warfarin, enoxaparin, and dalteparin  cimetidine  fentanyl  lithium  NSAIDS, medicines for pain and inflammation, like ibuprofen or naproxen  phentermine  procarbazine  rasagiline  sibutramine  St. John's wort  theophylline  tramadol  tryptophan This list may not describe all  possible interactions. Give your health care provider a list of all the medicines, herbs, non-prescription drugs, or dietary supplements you use. Also tell them if you smoke,  drink alcohol, or use illegal drugs. Some items may interact with your medicine. What should I watch for while using this medicine? Tell your doctor if your symptoms do not get better or if they get worse. Visit your doctor or healthcare provider for regular checks on your progress. Because it may take several weeks to see the full effects of this medicine, it is important to continue your treatment as prescribed by your doctor. This medicine may cause serious skin reactions. They can happen weeks to months after starting the medicine. Contact your healthcare provider right away if you notice fevers or flu-like symptoms with a rash. The rash may be red or purple and then turn into blisters or peeling of the skin. Or, you might notice a red rash with swelling of the face, lips, or lymph nodes in your neck or under your arms. Patients and their families should watch out for new or worsening thoughts of suicide or depression. Also watch out for sudden changes in feelings such as feeling anxious, agitated, panicky, irritable, hostile, aggressive, impulsive, severely restless, overly excited and hyperactive, or not being able to sleep. If this happens, especially at the beginning of treatment or after a change in dose, call your healthcare provider. You may get drowsy or dizzy. Do not drive, use machinery, or do anything that needs mental alertness until you know how this medicine affects you. Do not stand or sit up quickly, especially if you are an older patient. This reduces the risk of dizzy or fainting spells. Alcohol may interfere with the effect of this medicine. Avoid alcoholic drinks. This medicine can cause an increase in blood pressure. This medicine can also cause a sudden drop in your blood pressure, which may make you feel faint and increase the chance of a fall. These effects are most common when you first start the medicine or when the dose is increased, or during use of other medicines that can  cause a sudden drop in blood pressure. Check with your doctor for instructions on monitoring your blood pressure while taking this medicine. Your mouth may get dry. Chewing sugarless gum or sucking hard candy, and drinking plenty of water, may help. Contact your doctor if the problem does not go away or is severe. What side effects may I notice from receiving this medicine? Side effects that you should report to your doctor or health care professional as soon as possible:  allergic reactions like skin rash, itching or hives, swelling of the face, lips, or tongue  anxious  breathing problems  confusion  changes in vision  chest pain  confusion  elevated mood, decreased need for sleep, racing thoughts, impulsive behavior  eye pain  fast, irregular heartbeat  feeling faint or lightheaded, falls  feeling agitated, angry, or irritable  hallucination, loss of contact with reality  high blood pressure  loss of balance or coordination  palpitations  redness, blistering, peeling or loosening of the skin, including inside the mouth  restlessness, pacing, inability to keep still  seizures  stiff muscles  suicidal thoughts or other mood changes  trouble passing urine or change in the amount of urine  trouble sleeping  unusual bleeding or bruising  unusually weak or tired  vomiting  yellowing of the eyes or skin Side effects that usually do not require medical attention (report to  your doctor or health care professional if they continue or are bothersome):  change in sex drive or performance  change in appetite or weight  constipation  dizziness  dry mouth  headache  increased sweating  nausea  tired This list may not describe all possible side effects. Call your doctor for medical advice about side effects. You may report side effects to FDA at 1-800-FDA-1088. Where should I keep my medicine? Keep out of the reach of children. Store at room  temperature between 15 and 30 degrees C (59 to 86 degrees F). Throw away any unused medicine after the expiration date. NOTE: This sheet is a summary. It may not cover all possible information. If you have questions about this medicine, talk to your doctor, pharmacist, or health care provider.  2020 Elsevier/Gold Standard (2018-08-17 13:47:50)   It is important to avoid accidents which may result in broken bones.  Here are a few ideas on how to make your home safer so you will be less likely to trip or fall.  1. Use nonskid mats or non slip strips in your shower or tub, on your bathroom floor and around sinks.  If you know that you have spilled water, wipe it up! 2. In the bathroom, it is important to have properly installed grab bars on the walls or on the edge of the tub.  Towel racks are NOT strong enough for you to hold onto or to pull on for support. 3. Stairs and hallways should have enough light.  Add lamps or night lights if you need ore light. 4. It is good to have handrails on both sides of the stairs if possible.  Always fix broken handrails right away. 5. It is important to see the edges of steps.  Paint the edges of outdoor steps white so you can see them better.  Put colored tape on the edge of inside steps. 6. Throw-rugs are dangerous because they can slide.  Removing the rugs is the best idea, but if they must stay, add adhesive carpet tape to prevent slipping. 7. Do not keep things on stairs or in the halls.  Remove small furniture that blocks the halls as it may cause you to trip.  Keep telephone and electrical cords out of the way where you walk. 8. Always were sturdy, rubber-soled shoes for good support.  Never wear just socks, especially on the stairs.  Socks may cause you to slip or fall.  Do not wear full-length housecoats as you can easily trip on the bottom.  9. Place the things you use the most on the shelves that are the easiest to reach.  If you use a stepstool, make sure  it is in good condition.  If you feel unsteady, DO NOT climb, ask for help. 10. If a health professional advises you to use a cane or walker, do not be ashamed.  These items can keep you from falling and breaking your bones. Neuropathic Pain Neuropathic pain is pain caused by damage to the nerves that are responsible for certain sensations in your body (sensory nerves). The pain can be caused by:  Damage to the sensory nerves that send signals to your spinal cord and brain (peripheral nervous system).  Damage to the sensory nerves in your brain or spinal cord (central nervous system). Neuropathic pain can make you more sensitive to pain. Even a minor sensation can feel very painful. This is usually a long-term condition that can be difficult to treat. The type  of pain differs from person to person. It may:  Start suddenly (acute), or it may develop slowly and last for a long time (chronic).  Come and go as damaged nerves heal, or it may stay at the same level for years.  Cause emotional distress, loss of sleep, and a lower quality of life. What are the causes? The most common cause of this condition is diabetes. Many other diseases and conditions can also cause neuropathic pain. Causes of neuropathic pain can be classified as:  Toxic. This is caused by medicines and chemicals. The most common cause of toxic neuropathic pain is damage from cancer treatments (chemotherapy).  Metabolic. This can be caused by: ? Diabetes. This is the most common disease that damages the nerves. ? Lack of vitamin B from long-term alcohol abuse.  Traumatic. Any injury that cuts, crushes, or stretches a nerve can cause damage and pain. A common example is feeling pain after losing an arm or leg (phantom limb pain).  Compression-related. If a sensory nerve gets trapped or compressed for a long period of time, the blood supply to the nerve can be cut off.  Vascular. Many blood vessel diseases can cause neuropathic  pain by decreasing blood supply and oxygen to nerves.  Autoimmune. This type of pain results from diseases in which the body's defense system (immune system) mistakenly attacks sensory nerves. Examples of autoimmune diseases that can cause neuropathic pain include lupus and multiple sclerosis.  Infectious. Many types of viral infections can damage sensory nerves and cause pain. Shingles infection is a common cause of this type of pain.  Inherited. Neuropathic pain can be a symptom of many diseases that are passed down through families (genetic). What increases the risk? You are more likely to develop this condition if:  You have diabetes.  You smoke.  You drink too much alcohol.  You are taking certain medicines, including medicines that kill cancer cells (chemotherapy) or that treat immune system disorders. What are the signs or symptoms? The main symptom is pain. Neuropathic pain is often described as:  Burning.  Shock-like.  Stinging.  Hot or cold.  Itching. How is this diagnosed? No single test can diagnose neuropathic pain. It is diagnosed based on:  Physical exam and your symptoms. Your health care provider will ask you about your pain. You may be asked to use a pain scale to describe how bad your pain is.  Tests. These may be done to see if you have a high sensitivity to pain and to help find the cause and location of any sensory nerve damage. They include: ? Nerve conduction studies to test how well nerve signals travel through your sensory nerves (electrodiagnostic testing). ? Stimulating your sensory nerves through electrodes on your skin and measuring the response in your spinal cord and brain (somatosensory evoked potential).  Imaging studies, such as: ? X-rays. ? CT scan. ? MRI. How is this treated? Treatment for neuropathic pain may change over time. You may need to try different treatment options or a combination of treatments. Some options  include:  Treating the underlying cause of the neuropathy, such as diabetes, kidney disease, or vitamin deficiencies.  Stopping medicines that can cause neuropathy, such as chemotherapy.  Medicine to relieve pain. Medicines may include: ? Prescription or over-the-counter pain medicine. ? Anti-seizure medicine. ? Antidepressant medicines. ? Pain-relieving patches that are applied to painful areas of skin. ? A medicine to numb the area (local anesthetic), which can be injected as a nerve block.  Transcutaneous nerve stimulation. This uses electrical currents to block painful nerve signals. The treatment is painless.  Alternative treatments, such as: ? Acupuncture. ? Meditation. ? Massage. ? Physical therapy. ? Pain management programs. ? Counseling. Follow these instructions at home: Medicines   Take over-the-counter and prescription medicines only as told by your health care provider.  Do not drive or use heavy machinery while taking prescription pain medicine.  If you are taking prescription pain medicine, take actions to prevent or treat constipation. Your health care provider may recommend that you: ? Drink enough fluid to keep your urine pale yellow. ? Eat foods that are high in fiber, such as fresh fruits and vegetables, whole grains, and beans. ? Limit foods that are high in fat and processed sugars, such as fried or sweet foods. ? Take an over-the-counter or prescription medicine for constipation. Lifestyle   Have a good support system at home.  Consider joining a chronic pain support group.  Do not use any products that contain nicotine or tobacco, such as cigarettes and e-cigarettes. If you need help quitting, ask your health care provider.  Do not drink alcohol. General instructions  Learn as much as you can about your condition.  Work closely with all your health care providers to find the treatment plan that works best for you.  Ask your health care  provider what activities are safe for you.  Keep all follow-up visits as told by your health care provider. This is important. Contact a health care provider if:  Your pain treatments are not working.  You are having side effects from your medicines.  You are struggling with tiredness (fatigue), mood changes, depression, or anxiety. Summary  Neuropathic pain is pain caused by damage to the nerves that are responsible for certain sensations in your body (sensory nerves).  Neuropathic pain may come and go as damaged nerves heal, or it may stay at the same level for years.  Neuropathic pain is usually a long-term condition that can be difficult to treat. Consider joining a chronic pain support group. This information is not intended to replace advice given to you by your health care provider. Make sure you discuss any questions you have with your health care provider. Document Revised: 09/07/2018 Document Reviewed: 06/03/2017 Elsevier Patient Education  [place].

## 2019-07-18 NOTE — Progress Notes (Signed)
Chief Complaint  Patient presents with  . Fall    Balance issues with 2 total knee replacemements and metal with her back. Balance has been getting worse.   . Weight Gain    Patient states she has not been eating so is unsure why her weight is going up. Patient also states she has a history of thyroid issues.    F/u  1. Falls x 3 w/in the last few months last landed on left hip and had large bruise which is better. Fall was 1 week ago and was hard for her to get up. She falls more often when bending over too far or leaning too far forward.  She has issues with balance s/p TKR right and left knee and rfirst right leg was longer then left and right leg was crooked before surgery.  She just saw ortho and they do agree she needs disability and should not be able to work due to chronic ortho issues and willing to write a letter and see has a Chief Executive Officer and going through social services/security. She also thinks falls related to neuropathy. S/p back surgery 4 years ago her back feels tight and tylenol/advil w/o help takes flexeril and gabapentin with mild relief. At times she is numb in lower legs and feet left >right which makes it hard for her to know where she is placing her feet and due to this she takes her time walking slowly and getting up slowly and has to stand with 1 foot more forward than the other to feel more balanced. She is limping with walking. Numbness L>R worse in left lower leg than right lower leg esp since back surgery 4 years ago. Numbness can go up to left thigh or just below the knee since back surgery 4 years ago   Denies lightheadedness or dizziness with falls  She tried for disability 4 years ago after back surgery and was denied but trying again  Ortho seen 07/10/19   Lavonia Drafts III (Attending) [phone] (Work) [place], [place] [id number] Orthopaedic Surgery 2. C/o floaters in eyes rec see eye md has not seen in 1 year disc [place] [facility] in  future  3. Prev dx RA appt with Dr. Amil Amen 08/21/19 still having diffuse joint stiffness and hand swelling. She reports taking cratum supplement for chronic joint aches.   4. Depression due to no close friends near at home alone and husband works a lot and at times frustrated with him as he talker to her like she is a child. She called [facility] they do not take her insurance. Also due to weight gain she is frustrated mood depressed unable to exercise much due to #1 and she reports appetite down and not eating a lot  phq 9 score 18. More forgetful lately as well    Review of Systems  Constitutional: Negative for weight loss.  Respiratory: Negative for shortness of breath.   Cardiovascular: Negative for chest pain.  Musculoskeletal: Positive for back pain, falls and joint pain.  Neurological: Positive for sensory change.  Psychiatric/Behavioral: Positive for memory loss.   Past Medical History:  Diagnosis Date  . Abnormal Pap smear of cervix    h/o LSIL pap with HPV +; 01/2019 pap negative negative HPV  . Anxiety   . Atrial fibrillation ([place])   . B12 deficiency   . Chronic pain syndrome   . Colon polyps   . Congenital absence of right kidney   . Degeneration of  lumbar intervertebral disc   . Depression   . Early menopause   . Gastric bypass status for obesity    2013, weight 258lb  . Headache   . Heart murmur    MVP  . History of chicken pox   . Hypercholesterolemia   . Hypertension   . Iron deficiency   . Iron deficiency anemia 03/02/2019  . Neuropathy   . Rheumatoid arthritis ([place])    as child   . Scoliosis of lumbar spine   . Solitary kidney, congenital    pt thinks has left kidney   . Thyroid disease    in the past   . UTI (urinary tract infection)   . Vitamin D deficiency    Past Surgical History:  Procedure Laterality Date  . AUGMENTATION MAMMAPLASTY Bilateral 2009  . BACK SURGERY  11/04/2014   fusion of spine of lumbosacral region 2 rods and 16 screws    . BREAST ENHANCEMENT SURGERY    . COLONOSCOPY WITH PROPOFOL N/A 01/06/2018   Procedure: COLONOSCOPY WITH PROPOFOL;  Surgeon: Virgel Manifold, MD;  Location: [facility] ENDOSCOPY;  Service: Endoscopy;  Laterality: N/A;  . COLONOSCOPY WITH PROPOFOL N/A 03/19/2019   Procedure: COLONOSCOPY WITH PROPOFOL;  Surgeon: Virgel Manifold, MD;  Location: [facility] ENDOSCOPY;  Service: Endoscopy;  Laterality: N/A;  . excess skin removal    . KNEE ARTHROSCOPY Bilateral    x 7 knees b/l (2 surgeries on right knee) total 1 bone graft and 5 arthroscopy total knee   . ROUX-EN-Y PROCEDURE    . TOTAL KNEE ARTHROPLASTY     left 09/21/18 [name] [name]   Family History  Adopted: Yes  Problem Relation Age of Onset  . Arthritis Mother   . Drug abuse Mother        heriod OD  . Early death Mother   . Diabetes Father   . Hypertension Father   . Depression Father   . Arthritis Father   . Drug abuse Father   . Diabetes Sister   . Fibromyalgia Sister   . Rashes / Skin problems Sister        PSORIASIS  . Diabetes Brother   . Osteoarthritis Maternal Grandmother   . Aneurysm Maternal Aunt   . Thyroid disease Maternal Aunt   . Fibromyalgia Maternal Aunt   . Breast cancer Maternal Aunt   . Cancer Maternal Aunt        breast cancer    Social History   Socioeconomic History  . Marital status: Married    Spouse name: Not on file  . Number of children: Not on file  . Years of education: Not on file  . Highest education level: Not on file  Occupational History  . Not on file  Tobacco Use  . Smoking status: Never Smoker  . Smokeless tobacco: Never Used  Substance and Sexual Activity  . Alcohol use: Yes  . Drug use: No  . Sexual activity: Yes  Other Topics Concern  . Not on file  Social History Narrative   Married since 2012    No kids    From [place] moved to [facility] and now lives here    Currently unemployed used to be [employer]  unemployed since 04/2018    BA degree    No guns, wears seat belt, safe in relationship    Social Determinants of Health   Financial Resource Strain:   . Difficulty of Paying Living Expenses:  Not on file  Food Insecurity:   . Worried About Charity fundraiser in the Last Year: Not on file  . Ran Out of Food in the Last Year: Not on file  Transportation Needs:   . Lack of Transportation (Medical): Not on file  . Lack of Transportation (Non-Medical): Not on file  Physical Activity:   . Days of Exercise per Week: Not on file  . Minutes of Exercise per Session: Not on file  Stress:   . Feeling of Stress : Not on file  Social Connections:   . Frequency of Communication with Friends and Family: Not on file  . Frequency of Social Gatherings with Friends and Family: Not on file  . Attends Religious Services: Not on file  . Active Member of Clubs or Organizations: Not on file  . Attends Archivist Meetings: Not on file  . Marital Status: Not on file  Intimate Partner Violence:   . Fear of Current or Ex-Partner: Not on file  . Emotionally Abused: Not on file  . Physically Abused: Not on file  . Sexually Abused: Not on file   Current Meds  Medication Sig  . ALPRAZolam (XANAX) 0.25 MG tablet 1 pill 2x per day as needed or 1-2 at night for sleep  . conjugated estrogens (PREMARIN) vaginal cream Place 1 Applicatorful vaginally 2 (two) times a week.  . cyclobenzaprine (FLEXERIL) 10 MG tablet Take 1 tablet (10 mg total) by mouth 3 (three) times daily as needed.  . gabapentin (NEURONTIN) 600 MG tablet 1 pill in am 1 pill with lunch and 2 at night  . NON FORMULARY Liquid vitamin Miracle 2000  . [DISCONTINUED] ALPRAZolam (XANAX) 0.25 MG tablet 1 pill 2x per day as needed or 1-2 at night for sleep   Allergies  Allergen Reactions  . Morphine Nausea And Vomiting    Other reaction(s): Other (See Comments)   Recent Results (from the past 2160 hour(s))  Ferritin     Status: None    Collection Time: 05/28/19  1:28 PM  Result Value Ref Range   Ferritin 67 11 - 307 ng/mL    Comment: Performed at [facility], [address] [place], [place] [address]  CBC with Differential/Platelet     Status: Abnormal   Collection Time: 05/28/19  1:28 PM  Result Value Ref Range   WBC 6.6 4.0 - 10.5 K/uL   RBC 4.34 3.87 - 5.11 MIL/uL   Hemoglobin 11.2 (L) 12.0 - 15.0 g/dL   HCT 38.3 36.0 - 46.0 %   MCV 88.2 80.0 - 100.0 fL   MCH 25.8 (L) 26.0 - 34.0 pg   MCHC 29.2 (L) 30.0 - 36.0 g/dL   RDW 15.6 (H) 11.5 - 15.5 %   Platelets 328 150 - 400 K/uL   nRBC 0.0 0.0 - 0.2 %   Neutrophils Relative % 58 %   Neutro Abs 3.8 1.7 - 7.7 K/uL   Lymphocytes Relative 31 %   Lymphs Abs 2.1 0.7 - 4.0 K/uL   Monocytes Relative 7 %   Monocytes Absolute 0.5 0.1 - 1.0 K/uL   Eosinophils Relative 3 %   Eosinophils Absolute 0.2 0.0 - 0.5 K/uL   Basophils Relative 1 %   Basophils Absolute 0.1 0.0 - 0.1 K/uL   Immature Granulocytes 0 %   Abs Immature Granulocytes 0.02 0.00 - 0.07 K/uL    Comment: Performed at [facility], [place], [place] [address]  Iron and TIBC  Status: None   Collection Time: 05/28/19  1:28 PM  Result Value Ref Range   Iron 73 28 - 170 ug/dL   TIBC 290 250 - 450 ug/dL   Saturation Ratios 25 10.4 - 31.8 %   UIBC 217 ug/dL    Comment: Performed at [facility], [address] [place], [place] [address]   Objective  Body mass index is 27.91 kg/m. Wt Readings from Last 3 Encounters:  07/18/19 152 lb 9.6 oz (69.2 kg)  05/29/19 143 lb 3.2 oz (65 kg)  04/09/19 146 lb (66.2 kg)   Temp Readings from Last 3 Encounters:  07/18/19 98 F (36.7 C) (Temporal)  05/29/19 (!) 97.2 F (36.2 C)  03/29/19 97.7 F (36.5 C) (Tympanic)   BP Readings from Last 3 Encounters:  07/18/19 124/82  05/29/19 140/86  04/09/19 (!) 152/96   Pulse Readings from Last 3 Encounters:  07/18/19 99  05/29/19 98  04/09/19 92    Physical Exam Vitals  and nursing note reviewed.  Constitutional:      Appearance: Normal appearance. She is well-developed and well-groomed.  HENT:     Head: Normocephalic and atraumatic.  Eyes:     Conjunctiva/sclera: Conjunctivae normal.     Pupils: Pupils are equal, round, and reactive to light.  Cardiovascular:     Rate and Rhythm: Normal rate and regular rhythm.     Heart sounds: Normal heart sounds. No murmur.  Pulmonary:     Effort: Pulmonary effort is normal.     Breath sounds: Normal breath sounds.  Abdominal:     General: Abdomen is flat. Bowel sounds are normal.  Skin:    General: Skin is warm and dry.  Neurological:     General: No focal deficit present.     Mental Status: She is alert and oriented to person, place, and time. Mental status is at baseline.     Motor: Motor function is intact.     Gait: Gait abnormal.     Comments: Slowed gait   Psychiatric:        Attention and Perception: Attention and perception normal.        Mood and Affect: Mood and affect normal.        Speech: Speech normal.        Behavior: Behavior normal. Behavior is cooperative.        Thought Content: Thought content normal.        Cognition and Memory: Cognition and memory normal.        Judgment: Judgment normal.     Assessment  Plan  Anxiety and depression - Plan: DULoxetine (CYMBALTA) 20 MG capsule, ALPRAZolam (XANAX) 0.25 MG tablet  Insomnia, unspecified type - Plan: ALPRAZolam (XANAX) 0.25 MG tablet  Fall, initial encounter x 3  Will CC ortho to see if they think she needs more PT sessions  Refer neurology  Consider CT head in future if cont to fall   Neuropathy Refer to Dr. Manuella Ghazi NCS b/l legs  Add cymbalta 20 mg qd  Disc alpha lipoic acid   Vitreous floaters, unspecified laterality  Arthralgia, unspecified joint  Disc tumeric and GC capsules otc   HM Flu shot utd  covid shot not had yet  Tdap had 02/01/17   LMP age 20 early menopause  Pap 02/12/19-neg neg hpv Dr. Georgianne Fick  H/o  abnormal pap 03/21/17 HPV bx 04/08/17 [facility] Comment Comment: Material submitted:Marland Kitchen PART A: CERVIX, 4:00, BIOPSY PART B: CERVIX, 10:00, BIOPSY PART C: ENDOCERVIX, CURETTINGS    .  Comment Comment: Clinician provided ICD-10: R87.810   . Comment Comment:  Diagnosis: Part A: CERVIX, 4:00, BIOPSY: KOILOCYTOSIS CONSISTENT WITH HUMAN PAPILLOMAVIRUS (HPV) EFFECT. NO ENDOCERVICAL TISSUE. NEGATIVE FOR HIGH GRADE DYSPLASIA AND MALIGNANCY Part B: CERVIX, 10:00, BIOPSY: LOW-GRADE SQUAMOUS INTRAEPITHELIAL LESION (CIN 1). CHRONIC CERVICITIS. TRANSFORMATION ZONE NOT REPRESENTED. NEGATIVE FOR HIGH GRADE DYSPLASIA AND MALIGNANCY Part C: ENDOCERVIX, CURETTINGS: MINUTE FRAGMENTS OF BENIGN ENDOCERVICAL GLANDS, MUCUS, AND BLOOD. TRANSFORMATION ZONE NOT REPRESENTED. NEGATIVE FOR DYSPLASIA, VIRAL EFFECT, AND MALIGNANCY. COMMENT: The patients history of a positive hybrid capture DNA test for high-risk Human Papillomavirus serotypes is noted which warrants continued follow up. CER/04/08/2017    . Comment Comment: Electronically signed: . Elwanda Brooklyn, MD, Pathologist   . Comment Comment: Gross description: . 3 Containers, formalin-filled, labeled with patient identification. Part A: CERVIX, 4:00, BIOPSY: Received in formalin is .3 X .3 X .1 cm in aggregate of mucinous material.Tissue is submitted in toto in 1 cassette(s). Part B: CERVIX, 10:00, BIOPSY: Received in formalin is .2 X .2 X .1 cm in aggregate of mucinous and tan tissue fragments.Tissue is submitted in toto in 1 cassette(s). Part C: ENDOCERVIX, CURETTINGS: Received in formalin is .5 X .5 X .2 cm in aggregate of mucinous, tan and brown material.Tissue is submitted in toto in 1 cassette(s). /SBB /SBB   . Comment Comment: Pathologist provided ICD-10: B97.7, N72, N87.0   .  Comment Comment: CPT. [id number], [id number], [id number]      cologuard +04/14/17 - [facility]18 tubular adenoma poor prep rec f/u in 6-12 months -as of 03/19/19 repeat  colonoscopy with bx neg  GI -No known FH colon cancer found birth moms family     Mammogram referred for 3 D with implantshad 04/11/2019 negative   dexa 02/10/18 osteopenia  -needs to take calcium 600 mg qd and vitamin D3 4000 IU daily   No need for dermatology now     Provider: Dr. Olivia Mackie McLean-Scocuzza-Internal Medicine

## 2019-08-16 ENCOUNTER — Other Ambulatory Visit: Payer: Self-pay | Admitting: Internal Medicine

## 2019-08-16 DIAGNOSIS — M549 Dorsalgia, unspecified: Secondary | ICD-10-CM

## 2019-08-16 DIAGNOSIS — G8929 Other chronic pain: Secondary | ICD-10-CM

## 2019-08-16 MED ORDER — CYCLOBENZAPRINE HCL 10 MG PO TABS: 10.0000 mg | ORAL_TABLET | Freq: Three times a day (TID) | ORAL | 2 refills | Status: DC | PRN

## 2019-08-23 ENCOUNTER — Other Ambulatory Visit: Payer: Self-pay

## 2019-08-28 ENCOUNTER — Ambulatory Visit: Payer: 59 | Admitting: Internal Medicine

## 2019-08-28 ENCOUNTER — Encounter: Payer: Self-pay | Admitting: Internal Medicine

## 2019-09-13 ENCOUNTER — Other Ambulatory Visit: Payer: Self-pay | Admitting: Neurology

## 2019-09-13 DIAGNOSIS — M542 Cervicalgia: Secondary | ICD-10-CM

## 2019-09-26 ENCOUNTER — Ambulatory Visit
Admission: RE | Admit: 2019-09-26 | Discharge: 2019-09-26 | Disposition: A | Discharge status: Home / Self Care | Payer: 59 | Source: Ambulatory Visit | Attending: Neurology | Admitting: Neurology

## 2019-09-26 ENCOUNTER — Other Ambulatory Visit: Payer: Self-pay

## 2019-09-26 DIAGNOSIS — M542 Cervicalgia: Secondary | ICD-10-CM | POA: Diagnosis present

## 2019-09-27 ENCOUNTER — Other Ambulatory Visit: Payer: Self-pay | Admitting: Internal Medicine

## 2019-09-27 DIAGNOSIS — G629 Polyneuropathy, unspecified: Secondary | ICD-10-CM

## 2019-09-27 DIAGNOSIS — G8929 Other chronic pain: Secondary | ICD-10-CM

## 2019-09-27 MED ORDER — GABAPENTIN 600 MG PO TABS: ORAL_TABLET | ORAL | 11 refills | Status: DC

## 2019-09-28 DIAGNOSIS — R2689 Other abnormalities of gait and mobility: Secondary | ICD-10-CM | POA: Insufficient documentation

## 2019-09-28 DIAGNOSIS — M5412 Radiculopathy, cervical region: Secondary | ICD-10-CM | POA: Insufficient documentation

## 2019-09-28 DIAGNOSIS — M4802 Spinal stenosis, cervical region: Secondary | ICD-10-CM | POA: Insufficient documentation

## 2019-09-28 DIAGNOSIS — M542 Cervicalgia: Secondary | ICD-10-CM | POA: Insufficient documentation

## 2019-10-12 ENCOUNTER — Other Ambulatory Visit: Payer: Self-pay | Admitting: Internal Medicine

## 2019-10-12 ENCOUNTER — Encounter: Payer: Self-pay | Admitting: Internal Medicine

## 2019-10-12 DIAGNOSIS — G4700 Insomnia, unspecified: Secondary | ICD-10-CM

## 2019-10-12 DIAGNOSIS — F419 Anxiety disorder, unspecified: Secondary | ICD-10-CM

## 2019-10-12 DIAGNOSIS — F32A Depression, unspecified: Secondary | ICD-10-CM

## 2019-10-12 MED ORDER — ALPRAZOLAM 0.25 MG PO TABS: ORAL_TABLET | ORAL | 5 refills | Status: DC

## 2019-10-16 ENCOUNTER — Telehealth: Payer: Self-pay

## 2019-10-16 NOTE — Telephone Encounter (Signed)
Attempted to call.  Answering machine did not give me any way to leave a message.

## 2019-10-16 NOTE — Progress Notes (Signed)
Cancelled.  

## 2019-10-17 ENCOUNTER — Ambulatory Visit: Payer: 59 | Attending: Pain Medicine | Admitting: Pain Medicine

## 2019-10-17 ENCOUNTER — Telehealth: Payer: Self-pay

## 2019-10-17 ENCOUNTER — Other Ambulatory Visit: Payer: Self-pay

## 2019-10-17 DIAGNOSIS — M5137 Other intervertebral disc degeneration, lumbosacral region: Secondary | ICD-10-CM

## 2019-10-17 DIAGNOSIS — M899 Disorder of bone, unspecified: Secondary | ICD-10-CM | POA: Insufficient documentation

## 2019-10-17 DIAGNOSIS — M961 Postlaminectomy syndrome, not elsewhere classified: Secondary | ICD-10-CM | POA: Insufficient documentation

## 2019-10-17 DIAGNOSIS — G894 Chronic pain syndrome: Secondary | ICD-10-CM

## 2019-10-17 DIAGNOSIS — R937 Abnormal findings on diagnostic imaging of other parts of musculoskeletal system: Secondary | ICD-10-CM

## 2019-10-17 DIAGNOSIS — Z789 Other specified health status: Secondary | ICD-10-CM | POA: Insufficient documentation

## 2019-10-17 DIAGNOSIS — G8928 Other chronic postprocedural pain: Secondary | ICD-10-CM

## 2019-10-17 DIAGNOSIS — M25562 Pain in left knee: Secondary | ICD-10-CM

## 2019-10-17 DIAGNOSIS — Z79899 Other long term (current) drug therapy: Secondary | ICD-10-CM

## 2019-10-17 DIAGNOSIS — G8929 Other chronic pain: Secondary | ICD-10-CM

## 2019-10-17 DIAGNOSIS — M5412 Radiculopathy, cervical region: Secondary | ICD-10-CM

## 2019-10-17 DIAGNOSIS — M4135 Thoracogenic scoliosis, thoracolumbar region: Secondary | ICD-10-CM

## 2019-10-17 DIAGNOSIS — M5030 Other cervical disc degeneration, unspecified cervical region: Secondary | ICD-10-CM | POA: Insufficient documentation

## 2019-10-17 DIAGNOSIS — M542 Cervicalgia: Secondary | ICD-10-CM

## 2019-10-17 DIAGNOSIS — M5134 Other intervertebral disc degeneration, thoracic region: Secondary | ICD-10-CM | POA: Insufficient documentation

## 2019-10-17 DIAGNOSIS — M25561 Pain in right knee: Secondary | ICD-10-CM | POA: Insufficient documentation

## 2019-10-17 HISTORY — DX: Other long term (current) drug therapy: Z79.899

## 2019-10-17 HISTORY — DX: Abnormal findings on diagnostic imaging of other parts of musculoskeletal system: R93.7

## 2019-10-17 HISTORY — DX: Other specified health status: Z78.9

## 2019-10-17 NOTE — Telephone Encounter (Signed)
LM for patient to call office to go over new patient questionaire.

## 2019-10-31 ENCOUNTER — Other Ambulatory Visit: Payer: Self-pay | Admitting: Internal Medicine

## 2019-10-31 DIAGNOSIS — F419 Anxiety disorder, unspecified: Secondary | ICD-10-CM

## 2019-10-31 DIAGNOSIS — G8929 Other chronic pain: Secondary | ICD-10-CM

## 2019-10-31 DIAGNOSIS — F32A Depression, unspecified: Secondary | ICD-10-CM

## 2019-10-31 MED ORDER — CYCLOBENZAPRINE HCL 10 MG PO TABS: 10.0000 mg | ORAL_TABLET | Freq: Three times a day (TID) | ORAL | 5 refills | Status: DC | PRN

## 2019-10-31 MED ORDER — DULOXETINE HCL 20 MG PO CPEP: 20.0000 mg | ORAL_CAPSULE | Freq: Every day | ORAL | 3 refills | Status: DC

## 2019-11-02 ENCOUNTER — Other Ambulatory Visit: Payer: Self-pay | Admitting: Neurosurgery

## 2019-11-08 ENCOUNTER — Encounter: Payer: Self-pay | Admitting: Internal Medicine

## 2019-11-14 ENCOUNTER — Telehealth: Payer: Self-pay | Admitting: *Deleted

## 2019-11-14 NOTE — Telephone Encounter (Signed)
Per pt request to cx her 6/29 lab and 11/29/19 MD/+/- Venofer appts due to her having to have neck surgery. Pt stated that she will call the office back at a later date to have appts R/S.Marland Kitchen

## 2019-11-20 ENCOUNTER — Other Ambulatory Visit: Payer: Self-pay

## 2019-11-20 ENCOUNTER — Encounter
Admission: RE | Admit: 2019-11-20 | Discharge: 2019-11-20 | Disposition: A | Discharge status: Home / Self Care | Payer: 59 | Source: Ambulatory Visit | Attending: Neurosurgery | Admitting: Neurosurgery

## 2019-11-20 NOTE — Patient Instructions (Signed)
Your procedure is scheduled on: 11/28/19 Report to [place]. To find out your arrival time please call [phone] between 1PM - 3PM on 11/27/19.  Remember: Instructions that are not followed completely may result in serious medical risk, up to and including death, or upon the discretion of your surgeon and anesthesiologist your surgery may need to be rescheduled.     _X__ 1. Do not eat food after midnight the night before your procedure.                 No gum chewing or hard candies. You may drink clear liquids up to 2 hours                 before you are scheduled to arrive for your surgery- DO not drink clear                 liquids within 2 hours of the start of your surgery.                 Clear Liquids include:  water, apple juice without pulp, clear carbohydrate                 drink such as Clearfast or Gatorade, Black Coffee or Tea (Do not add                 anything to coffee or tea). Diabetics water only  __X__2.  On the morning of surgery brush your teeth with toothpaste and water, you                 may rinse your mouth with mouthwash if you wish.  Do not swallow any              toothpaste of mouthwash.     _X__ 3.  No Alcohol for 24 hours before or after surgery.   _X__ 4.  Do Not Smoke or use e-cigarettes For 24 Hours Prior to Your Surgery.                 Do not use any chewable tobacco products for at least 6 hours prior to                 surgery.  ____  5.  Bring all medications with you on the day of surgery if instructed.   __X__  6.  Notify your doctor if there is any change in your medical condition      (cold, fever, infections).     Do not wear jewelry, make-up, hairpins, clips or nail polish. Do not wear lotions, powders, or perfumes.  Do not shave 48 hours prior to surgery. Men may shave face and neck. Do not bring valuables to the hospital.    [facility] is not responsible for any belongings or  valuables.  Contacts, dentures/partials or body piercings may not be worn into surgery. Bring a case for your contacts, glasses or hearing aids, a denture cup will be supplied. Leave your suitcase in the car. After surgery it may be brought to your room. For patients admitted to the hospital, discharge time is determined by your treatment team.   Patients discharged the day of surgery will not be allowed to drive home.   Please read over the following fact sheets that you were given:   MRSA Information  __X__ Take these medicines the morning of surgery with A SIP OF WATER:  1. cyclobenzaprine (FLEXERIL) 10 MG tablet  2. DULoxetine (CYMBALTA) 20 MG capsule  3. gabapentin (NEURONTIN) 600 MG tablet  4.  5.  6.  ____ Fleet Enema (as directed)   __X__ Use CHG Soap/SAGE wipes as directed  ____ Use inhalers on the day of surgery  ____ Stop metformin/Janumet/Farxiga 2 days prior to surgery    ____ Take 1/2 of usual insulin dose the night before surgery. No insulin the morning          of surgery.   ____ Stop Blood Thinners Coumadin/Plavix/Xarelto/Pleta/Pradaxa/Eliquis/Effient/Aspirin  on   Or contact your Surgeon, Cardiologist or Medical Doctor regarding  ability to stop your blood thinners  __X__ Stop Anti-inflammatories 7 days before surgery such as Advil, Ibuprofen, Motrin,  BC or Goodies Powder, Naprosyn, Naproxen, Aleve, Aspirin    __X__ Stop all herbal supplements, fish oil or vitamin E until after surgery.    ____ Bring C-Pap to the hospital.

## 2019-11-21 ENCOUNTER — Encounter
Admission: RE | Admit: 2019-11-21 | Discharge: 2019-11-21 | Disposition: A | Discharge status: Home / Self Care | Payer: 59 | Source: Ambulatory Visit | Attending: Neurosurgery | Admitting: Neurosurgery

## 2019-11-21 ENCOUNTER — Encounter: Payer: Self-pay | Admitting: Urgent Care

## 2019-11-21 DIAGNOSIS — Z01818 Encounter for other preprocedural examination: Secondary | ICD-10-CM | POA: Diagnosis not present

## 2019-11-21 LAB — CBC
HCT: 37.6 % (ref 36.0–46.0)
Hemoglobin: 11.8 g/dL — ABNORMAL LOW (ref 12.0–15.0)
MCH: 27.6 pg (ref 26.0–34.0)
MCHC: 31.4 g/dL (ref 30.0–36.0)
MCV: 87.9 fL (ref 80.0–100.0)
Platelets: 363 10*3/uL (ref 150–400)
RBC: 4.28 MIL/uL (ref 3.87–5.11)
RDW: 14.4 % (ref 11.5–15.5)
WBC: 6.5 10*3/uL (ref 4.0–10.5)
nRBC: 0 % (ref 0.0–0.2)

## 2019-11-21 LAB — URINALYSIS, ROUTINE W REFLEX MICROSCOPIC
Bilirubin Urine: NEGATIVE
Glucose, UA: NEGATIVE mg/dL
Hgb urine dipstick: NEGATIVE
Ketones, ur: NEGATIVE mg/dL
Leukocytes,Ua: NEGATIVE
Nitrite: NEGATIVE
Protein, ur: NEGATIVE mg/dL
Specific Gravity, Urine: 1.013 (ref 1.005–1.030)
pH: 6 (ref 5.0–8.0)

## 2019-11-21 LAB — TYPE AND SCREEN
ABO/RH(D): B POS
Antibody Screen: NEGATIVE

## 2019-11-21 LAB — PROTIME-INR
INR: 0.9 (ref 0.8–1.2)
Prothrombin Time: 12.2 seconds (ref 11.4–15.2)

## 2019-11-21 LAB — SURGICAL PCR SCREEN
MRSA, PCR: NEGATIVE
Staphylococcus aureus: NEGATIVE

## 2019-11-21 LAB — BASIC METABOLIC PANEL
Anion gap: 10 (ref 5–15)
BUN: 14 mg/dL (ref 6–20)
CO2: 29 mmol/L (ref 22–32)
Calcium: 8.9 mg/dL (ref 8.9–10.3)
Chloride: 102 mmol/L (ref 98–111)
Creatinine, Ser: 0.88 mg/dL (ref 0.44–1.00)
GFR calc Af Amer: >60 mL/min (ref >60)
GFR calc non Af Amer: >60 mL/min (ref >60)
Glucose, Bld: 94 mg/dL (ref 70–99)
Potassium: 3.7 mmol/L (ref 3.5–5.1)
Sodium: 141 mmol/L (ref 135–145)

## 2019-11-21 LAB — APTT: aPTT: 29 seconds (ref 24–36)

## 2019-11-26 ENCOUNTER — Other Ambulatory Visit: Payer: Self-pay

## 2019-11-26 ENCOUNTER — Other Ambulatory Visit
Admission: RE | Admit: 2019-11-26 | Discharge: 2019-11-26 | Disposition: A | Discharge status: Home / Self Care | Payer: 59 | Source: Ambulatory Visit | Attending: Neurosurgery | Admitting: Neurosurgery

## 2019-11-26 LAB — SARS CORONAVIRUS 2 (TAT 6-24 HRS): SARS Coronavirus 2: NEGATIVE

## 2019-11-27 ENCOUNTER — Other Ambulatory Visit: Payer: 59

## 2019-11-28 ENCOUNTER — Inpatient Hospital Stay: Payer: 59 | Admitting: Anesthesiology

## 2019-11-28 ENCOUNTER — Encounter
Admission: RE | Disposition: A | Discharge status: Home / Self Care | Payer: Self-pay | Source: Home / Self Care | Attending: Neurosurgery

## 2019-11-28 ENCOUNTER — Inpatient Hospital Stay: Payer: 59

## 2019-11-28 ENCOUNTER — Inpatient Hospital Stay
Admission: RE | Admit: 2019-11-28 | Discharge: 2019-11-30 | DRG: 472 | Disposition: A | Discharge status: Home / Self Care | Payer: 59 | Attending: Neurosurgery | Admitting: Neurosurgery

## 2019-11-28 ENCOUNTER — Encounter: Payer: Self-pay | Admitting: Neurosurgery

## 2019-11-28 ENCOUNTER — Other Ambulatory Visit: Payer: Self-pay

## 2019-11-28 DIAGNOSIS — Z419 Encounter for procedure for purposes other than remedying health state, unspecified: Secondary | ICD-10-CM

## 2019-11-28 DIAGNOSIS — M5001 Cervical disc disorder with myelopathy,  high cervical region: Secondary | ICD-10-CM | POA: Diagnosis present

## 2019-11-28 DIAGNOSIS — M4802 Spinal stenosis, cervical region: Principal | ICD-10-CM | POA: Diagnosis present

## 2019-11-28 DIAGNOSIS — Q600 Renal agenesis, unilateral: Secondary | ICD-10-CM | POA: Diagnosis not present

## 2019-11-28 DIAGNOSIS — Z20822 Contact with and (suspected) exposure to covid-19: Secondary | ICD-10-CM | POA: Diagnosis present

## 2019-11-28 DIAGNOSIS — M5412 Radiculopathy, cervical region: Secondary | ICD-10-CM | POA: Diagnosis present

## 2019-11-28 DIAGNOSIS — I10 Essential (primary) hypertension: Secondary | ICD-10-CM | POA: Diagnosis present

## 2019-11-28 DIAGNOSIS — Z981 Arthrodesis status: Secondary | ICD-10-CM

## 2019-11-28 HISTORY — PX: ANTERIOR CERVICAL DECOMPRESSION/DISCECTOMY FUSION 4 LEVELS: SHX5556

## 2019-11-28 LAB — ABO/RH: ABO/RH(D): B POS

## 2019-11-28 SURGERY — ANTERIOR CERVICAL DECOMPRESSION/DISCECTOMY FUSION 4 LEVELS
Anesthesia: General

## 2019-11-28 MED ORDER — PROPOFOL 10 MG/ML IV BOLUS
INTRAVENOUS | Status: AC
  Filled 2019-11-28: qty 20

## 2019-11-28 MED ORDER — ONDANSETRON HCL 4 MG/2ML IJ SOLN: 4.0000 mg | Freq: Once | INTRAMUSCULAR | Status: DC | PRN

## 2019-11-28 MED ORDER — CELECOXIB 100 MG PO CAPS
100.0000 mg | ORAL_CAPSULE | Freq: Two times a day (BID) | ORAL | Status: DC
  Filled 2019-11-28 (×2): qty 1

## 2019-11-28 MED ORDER — OXYCODONE HCL 5 MG PO TABS: 10.0000 mg | ORAL_TABLET | ORAL | Status: DC | PRN

## 2019-11-28 MED ORDER — SODIUM CHLORIDE 0.9% FLUSH
3.0000 mL | Freq: Two times a day (BID) | INTRAVENOUS | Status: DC
  Administered 2019-11-29 – 2019-11-30 (×3): 3 mL via INTRAVENOUS

## 2019-11-28 MED ORDER — MIDAZOLAM HCL 2 MG/2ML IJ SOLN
INTRAMUSCULAR | Status: AC
  Filled 2019-11-28: qty 2

## 2019-11-28 MED ORDER — FLEET ENEMA 7-19 GM/118ML RE ENEM: 1.0000 | ENEMA | Freq: Once | RECTAL | Status: DC | PRN

## 2019-11-28 MED ORDER — BUPIVACAINE-EPINEPHRINE (PF) 0.5% -1:200000 IJ SOLN
INTRAMUSCULAR | Status: AC
  Filled 2019-11-28: qty 30

## 2019-11-28 MED ORDER — ONDANSETRON HCL 4 MG PO TABS: 4.0000 mg | ORAL_TABLET | Freq: Four times a day (QID) | ORAL | Status: DC | PRN

## 2019-11-28 MED ORDER — POLYETHYLENE GLYCOL 3350 17 G PO PACK: 17.0000 g | PACK | Freq: Every day | ORAL | Status: DC | PRN

## 2019-11-28 MED ORDER — REMIFENTANIL HCL 1 MG IV SOLR
INTRAVENOUS | Status: DC | PRN
  Administered 2019-11-28: .01 ug/kg/min via INTRAVENOUS

## 2019-11-28 MED ORDER — KETAMINE HCL 50 MG/ML IJ SOLN
INTRAMUSCULAR | Status: AC
  Filled 2019-11-28: qty 10

## 2019-11-28 MED ORDER — THROMBIN 5000 UNITS EX SOLR
CUTANEOUS | Status: DC | PRN
  Administered 2019-11-28: 5000 [IU] via TOPICAL

## 2019-11-28 MED ORDER — FENTANYL CITRATE (PF) 100 MCG/2ML IJ SOLN
INTRAMUSCULAR | Status: AC
  Filled 2019-11-28: qty 2

## 2019-11-28 MED ORDER — OXYCODONE HCL 5 MG PO TABS: 5.0000 mg | ORAL_TABLET | ORAL | Status: DC | PRN

## 2019-11-28 MED ORDER — THROMBIN 5000 UNITS EX SOLR
CUTANEOUS | Status: AC
  Filled 2019-11-28: qty 5000

## 2019-11-28 MED ORDER — LIDOCAINE HCL (PF) 2 % IJ SOLN
INTRAMUSCULAR | Status: DC | PRN
Start: 2019-11-28 — End: 2019-11-28
  Administered 2019-11-28: 40 mg via INTRADERMAL

## 2019-11-28 MED ORDER — ACETAMINOPHEN 10 MG/ML IV SOLN
INTRAVENOUS | Status: DC | PRN
  Administered 2019-11-28: 1000 mg via INTRAVENOUS

## 2019-11-28 MED ORDER — PHENOL 1.4 % MT LIQD
1.0000 | OROMUCOSAL | Status: DC | PRN
  Filled 2019-11-28 (×2): qty 177

## 2019-11-28 MED ORDER — METHOCARBAMOL 1000 MG/10ML IJ SOLN
1000.0000 mg | Freq: Four times a day (QID) | INTRAVENOUS | Status: DC
  Administered 2019-11-28 – 2019-11-29 (×2): 1000 mg via INTRAVENOUS
  Filled 2019-11-28 (×7): qty 10

## 2019-11-28 MED ORDER — CEFAZOLIN SODIUM-DEXTROSE 2-4 GM/100ML-% IV SOLN
INTRAVENOUS | Status: AC
  Filled 2019-11-28: qty 100

## 2019-11-28 MED ORDER — DEXAMETHASONE SODIUM PHOSPHATE 10 MG/ML IJ SOLN
INTRAMUSCULAR | Status: DC | PRN
  Administered 2019-11-28: 10 mg via INTRAVENOUS

## 2019-11-28 MED ORDER — MENTHOL 3 MG MT LOZG
1.0000 | LOZENGE | OROMUCOSAL | Status: DC | PRN
  Filled 2019-11-28 (×2): qty 9

## 2019-11-28 MED ORDER — OXYCODONE HCL 5 MG/5ML PO SOLN
5.0000 mg | ORAL | Status: DC | PRN
  Administered 2019-11-28 – 2019-11-30 (×9): 10 mg via ORAL
  Filled 2019-11-28 (×9): qty 10

## 2019-11-28 MED ORDER — ALPRAZOLAM 0.5 MG PO TABS
0.5000 mg | ORAL_TABLET | Freq: Every day | ORAL | Status: DC
  Administered 2019-11-28: 0.5 mg via ORAL
  Filled 2019-11-28: qty 1

## 2019-11-28 MED ORDER — HYDROMORPHONE HCL 1 MG/ML IJ SOLN
0.2500 mg | INTRAMUSCULAR | Status: DC | PRN
  Administered 2019-11-28 (×5): 0.25 mg via INTRAVENOUS

## 2019-11-28 MED ORDER — FENTANYL CITRATE (PF) 100 MCG/2ML IJ SOLN
25.0000 ug | INTRAMUSCULAR | Status: DC | PRN
  Administered 2019-11-28 (×4): 25 ug via INTRAVENOUS

## 2019-11-28 MED ORDER — DULOXETINE HCL 20 MG PO CPEP
20.0000 mg | ORAL_CAPSULE | Freq: Every day | ORAL | Status: DC
  Administered 2019-11-29 – 2019-11-30 (×2): 20 mg via ORAL
  Filled 2019-11-28 (×2): qty 1

## 2019-11-28 MED ORDER — HYDROMORPHONE HCL 1 MG/ML IJ SOLN
INTRAMUSCULAR | Status: AC
  Administered 2019-11-28: 0.25 mg via INTRAVENOUS
  Filled 2019-11-28: qty 1

## 2019-11-28 MED ORDER — MIDAZOLAM HCL 2 MG/2ML IJ SOLN
INTRAMUSCULAR | Status: DC | PRN
  Administered 2019-11-28: 2 mg via INTRAVENOUS

## 2019-11-28 MED ORDER — METHOCARBAMOL 500 MG PO TABS
750.0000 mg | ORAL_TABLET | Freq: Four times a day (QID) | ORAL | Status: DC
  Administered 2019-11-29 – 2019-11-30 (×6): 750 mg via ORAL
  Filled 2019-11-28 (×7): qty 2

## 2019-11-28 MED ORDER — KETAMINE HCL 50 MG/ML IJ SOLN
INTRAMUSCULAR | Status: DC | PRN
  Administered 2019-11-28 (×2): 20 mg via INTRAMUSCULAR

## 2019-11-28 MED ORDER — CHLORHEXIDINE GLUCONATE 0.12 % MT SOLN: 15.0000 mL | Freq: Once | OROMUCOSAL | Status: AC

## 2019-11-28 MED ORDER — CHLORHEXIDINE GLUCONATE 0.12 % MT SOLN
OROMUCOSAL | Status: AC
  Administered 2019-11-28: 15 mL via OROMUCOSAL
  Filled 2019-11-28: qty 15

## 2019-11-28 MED ORDER — ACETAMINOPHEN 500 MG PO TABS
1000.0000 mg | ORAL_TABLET | Freq: Four times a day (QID) | ORAL | Status: AC
  Administered 2019-11-28 – 2019-11-29 (×4): 1000 mg via ORAL
  Filled 2019-11-28 (×4): qty 2

## 2019-11-28 MED ORDER — ONDANSETRON HCL 4 MG/2ML IJ SOLN
INTRAMUSCULAR | Status: DC | PRN
  Administered 2019-11-28: 4 mg via INTRAVENOUS

## 2019-11-28 MED ORDER — LIDOCAINE HCL (PF) 2 % IJ SOLN
INTRAMUSCULAR | Status: AC
  Filled 2019-11-28: qty 5

## 2019-11-28 MED ORDER — FENTANYL CITRATE (PF) 100 MCG/2ML IJ SOLN
INTRAMUSCULAR | Status: AC
  Administered 2019-11-28: 25 ug via INTRAVENOUS
  Filled 2019-11-28: qty 2

## 2019-11-28 MED ORDER — SODIUM CHLORIDE 0.9% FLUSH: 3.0000 mL | INTRAVENOUS | Status: DC | PRN

## 2019-11-28 MED ORDER — SENNA 8.6 MG PO TABS
1.0000 | ORAL_TABLET | Freq: Two times a day (BID) | ORAL | Status: DC
  Administered 2019-11-29: 8.6 mg via ORAL
  Filled 2019-11-28 (×3): qty 1

## 2019-11-28 MED ORDER — CEFAZOLIN SODIUM-DEXTROSE 2-4 GM/100ML-% IV SOLN
2.0000 g | Freq: Once | INTRAVENOUS | Status: AC
  Administered 2019-11-28: 2 g via INTRAVENOUS

## 2019-11-28 MED ORDER — EPHEDRINE 5 MG/ML INJ
INTRAVENOUS | Status: AC
  Filled 2019-11-28: qty 10

## 2019-11-28 MED ORDER — BUPIVACAINE-EPINEPHRINE (PF) 0.5% -1:200000 IJ SOLN
INTRAMUSCULAR | Status: DC | PRN
  Administered 2019-11-28: 4 mL

## 2019-11-28 MED ORDER — DEXAMETHASONE SODIUM PHOSPHATE 10 MG/ML IJ SOLN
INTRAMUSCULAR | Status: AC
  Filled 2019-11-28: qty 1

## 2019-11-28 MED ORDER — REMIFENTANIL HCL 1 MG IV SOLR
INTRAVENOUS | Status: AC
  Filled 2019-11-28: qty 1000

## 2019-11-28 MED ORDER — DEXMEDETOMIDINE HCL 200 MCG/2ML IV SOLN
INTRAVENOUS | Status: DC | PRN
  Administered 2019-11-28: 4 ug via INTRAVENOUS
  Administered 2019-11-28: 8 ug via INTRAVENOUS
  Administered 2019-11-28: 12 ug via INTRAVENOUS
  Administered 2019-11-28: 8 ug via INTRAVENOUS

## 2019-11-28 MED ORDER — SODIUM CHLORIDE 0.9 % IV SOLN
250.0000 mL | INTRAVENOUS | Status: DC
  Administered 2019-11-28 – 2019-11-29 (×2): 250 mL via INTRAVENOUS

## 2019-11-28 MED ORDER — HYDROXYCHLOROQUINE SULFATE 200 MG PO TABS
200.0000 mg | ORAL_TABLET | Freq: Two times a day (BID) | ORAL | Status: DC
  Administered 2019-11-29 – 2019-11-30 (×3): 200 mg via ORAL
  Filled 2019-11-28 (×4): qty 1

## 2019-11-28 MED ORDER — ACETAMINOPHEN 10 MG/ML IV SOLN
INTRAVENOUS | Status: AC
  Filled 2019-11-28: qty 100

## 2019-11-28 MED ORDER — METHOCARBAMOL 1000 MG/10ML IJ SOLN
500.0000 mg | Freq: Once | INTRAVENOUS | Status: AC
  Administered 2019-11-28: 500 mg via INTRAVENOUS
  Filled 2019-11-28: qty 5

## 2019-11-28 MED ORDER — SUCCINYLCHOLINE CHLORIDE 20 MG/ML IJ SOLN
INTRAMUSCULAR | Status: DC | PRN
Start: 2019-11-28 — End: 2019-11-28
  Administered 2019-11-28: 100 mg via INTRAVENOUS

## 2019-11-28 MED ORDER — EPHEDRINE SULFATE 50 MG/ML IJ SOLN
INTRAMUSCULAR | Status: DC | PRN
  Administered 2019-11-28: 10 mg via INTRAVENOUS

## 2019-11-28 MED ORDER — FENTANYL CITRATE (PF) 100 MCG/2ML IJ SOLN
INTRAMUSCULAR | Status: DC | PRN
  Administered 2019-11-28 (×3): 50 ug via INTRAVENOUS

## 2019-11-28 MED ORDER — PROPOFOL 10 MG/ML IV BOLUS
INTRAVENOUS | Status: DC | PRN
  Administered 2019-11-28: 200 mg via INTRAVENOUS

## 2019-11-28 MED ORDER — LACTATED RINGERS IV SOLN: INTRAVENOUS | Status: DC

## 2019-11-28 MED ORDER — SODIUM CHLORIDE 0.9 % IV SOLN
INTRAVENOUS | Status: DC | PRN
  Administered 2019-11-28: 30 ug/min via INTRAVENOUS

## 2019-11-28 MED ORDER — ONDANSETRON HCL 4 MG/2ML IJ SOLN: 4.0000 mg | Freq: Four times a day (QID) | INTRAMUSCULAR | Status: DC | PRN

## 2019-11-28 MED ORDER — PROPOFOL 500 MG/50ML IV EMUL
INTRAVENOUS | Status: AC
  Filled 2019-11-28: qty 50

## 2019-11-28 MED ORDER — HYDROMORPHONE HCL 1 MG/ML IJ SOLN
0.5000 mg | INTRAMUSCULAR | Status: DC | PRN
  Administered 2019-11-28 – 2019-11-29 (×4): 0.5 mg via INTRAVENOUS
  Filled 2019-11-28 (×4): qty 1

## 2019-11-28 MED ORDER — SUCCINYLCHOLINE CHLORIDE 200 MG/10ML IV SOSY
PREFILLED_SYRINGE | INTRAVENOUS | Status: AC
  Filled 2019-11-28: qty 10

## 2019-11-28 MED ORDER — BACITRACIN 50000 UNITS IM SOLR
INTRAMUSCULAR | Status: AC
  Filled 2019-11-28: qty 1

## 2019-11-28 MED ORDER — ORAL CARE MOUTH RINSE: 15.0000 mL | Freq: Once | OROMUCOSAL | Status: AC

## 2019-11-28 MED ORDER — BISACODYL 10 MG RE SUPP: 10.0000 mg | Freq: Every day | RECTAL | Status: DC | PRN

## 2019-11-28 MED ORDER — ONDANSETRON HCL 4 MG/2ML IJ SOLN
INTRAMUSCULAR | Status: AC
  Filled 2019-11-28: qty 2

## 2019-11-28 MED ORDER — SODIUM CHLORIDE FLUSH 0.9 % IV SOLN
INTRAVENOUS | Status: AC
  Filled 2019-11-28: qty 10

## 2019-11-28 MED ORDER — FAMOTIDINE 20 MG PO TABS: 20.0000 mg | ORAL_TABLET | Freq: Once | ORAL | Status: AC

## 2019-11-28 MED ORDER — FAMOTIDINE 20 MG PO TABS
ORAL_TABLET | ORAL | Status: AC
  Administered 2019-11-28: 20 mg via ORAL
  Filled 2019-11-28: qty 1

## 2019-11-28 MED ORDER — GABAPENTIN 300 MG PO CAPS
600.0000 mg | ORAL_CAPSULE | Freq: Two times a day (BID) | ORAL | Status: DC
  Administered 2019-11-28 – 2019-11-30 (×4): 600 mg via ORAL
  Filled 2019-11-28 (×4): qty 2

## 2019-11-28 MED ORDER — PHENYLEPHRINE HCL (PRESSORS) 10 MG/ML IV SOLN
INTRAVENOUS | Status: AC
  Filled 2019-11-28: qty 1

## 2019-11-28 MED ORDER — PROPOFOL 500 MG/50ML IV EMUL
INTRAVENOUS | Status: DC | PRN
  Administered 2019-11-28: 130 ug/kg/min via INTRAVENOUS

## 2019-11-28 MED ORDER — SODIUM CHLORIDE 0.9 % IR SOLN
Status: DC | PRN
  Administered 2019-11-28: 1000 mL

## 2019-11-28 MED ORDER — DEXAMETHASONE SODIUM PHOSPHATE 4 MG/ML IJ SOLN
4.0000 mg | Freq: Four times a day (QID) | INTRAMUSCULAR | Status: AC
  Administered 2019-11-29 (×3): 4 mg via INTRAVENOUS
  Filled 2019-11-28 (×4): qty 1

## 2019-11-28 SURGICAL SUPPLY — 61 items
BLADE BOVIE TIP EXT 4 (BLADE) IMPLANT
BULB RESERV EVAC DRAIN JP 100C (MISCELLANEOUS) IMPLANT
BUR NEURO DRILL SOFT 3.0X3.8M (BURR) ×2 IMPLANT
CANISTER SUCT 1200ML W/VALVE (MISCELLANEOUS) ×4 IMPLANT
CHLORAPREP W/TINT 26 (MISCELLANEOUS) ×4 IMPLANT
COUNTER NEEDLE 20/40 LG (NEEDLE) ×2 IMPLANT
COVER LIGHT HANDLE STERIS (MISCELLANEOUS) ×4 IMPLANT
COVER WAND RF STERILE (DRAPES) ×2 IMPLANT
CRADLE LAMINECT ARM (MISCELLANEOUS) ×2 IMPLANT
DERMABOND ADVANCED (GAUZE/BANDAGES/DRESSINGS) ×1
DERMABOND ADVANCED .7 DNX12 (GAUZE/BANDAGES/DRESSINGS) ×1 IMPLANT
DRAIN CHANNEL JP 10F RND 20C F (MISCELLANEOUS) IMPLANT
DRAPE C-ARM 42X72 X-RAY (DRAPES) ×4 IMPLANT
DRAPE LAPAROTOMY 77X122 PED (DRAPES) ×2 IMPLANT
DRAPE MICROSCOPE SPINE 48X150 (DRAPES) ×2 IMPLANT
DRAPE POUCH INSTRU U-SHP 10X18 (DRAPES) IMPLANT
DRAPE SURG 17X11 SM STRL (DRAPES) ×8 IMPLANT
ELECT CAUTERY BLADE TIP 2.5 (TIP) ×2
ELECT REM PT RETURN 9FT ADLT (ELECTROSURGICAL) ×2
ELECTRODE CAUTERY BLDE TIP 2.5 (TIP) ×1 IMPLANT
ELECTRODE REM PT RTRN 9FT ADLT (ELECTROSURGICAL) ×1 IMPLANT
FEE INTRAOP MONITOR IMPULS NCS (MISCELLANEOUS) ×1 IMPLANT
FRAME EYE SHIELD (PROTECTIVE WEAR) ×2 IMPLANT
GLOVE BIOGEL PI IND STRL 7.0 (GLOVE) ×1 IMPLANT
GLOVE BIOGEL PI INDICATOR 7.0 (GLOVE) ×1
GLOVE SURG SYN 7.0 (GLOVE) ×4 IMPLANT
GLOVE SURG SYN 8.5  E (GLOVE) ×3
GLOVE SURG SYN 8.5 E (GLOVE) ×3 IMPLANT
GOWN SRG XL LVL 3 NONREINFORCE (GOWNS) ×1 IMPLANT
GOWN STRL NON-REIN TWL XL LVL3 (GOWNS) ×1
GOWN STRL REUS W/ TWL XL LVL3 (GOWN DISPOSABLE) ×1 IMPLANT
GOWN STRL REUS W/TWL XL LVL3 (GOWN DISPOSABLE) ×1
GRADUATE 1200CC STRL 31836 (MISCELLANEOUS) ×2 IMPLANT
INTRAOP MONITOR FEE IMPULS NCS (MISCELLANEOUS) ×1
INTRAOP MONITOR FEE IMPULSE (MISCELLANEOUS) ×1
IV CATH ANGIO 12GX3 LT BLUE (NEEDLE) ×2 IMPLANT
KIT TURNOVER KIT A (KITS) ×2 IMPLANT
MARKER SKIN DUAL TIP RULER LAB (MISCELLANEOUS) ×4 IMPLANT
NEEDLE HYPO 22GX1.5 SAFETY (NEEDLE) ×2 IMPLANT
NS IRRIG 1000ML POUR BTL (IV SOLUTION) ×2 IMPLANT
PACK LAMINECTOMY NEURO (CUSTOM PROCEDURE TRAY) ×2 IMPLANT
PIN CASPAR 14 (PIN) ×1 IMPLANT
PIN CASPAR 14MM (PIN) ×2
PLATE ANT CERV XTEND 4 LV 60 (Plate) ×2 IMPLANT
PUTTY DBX 5CC (Putty) ×2 IMPLANT
SCREW VAR 4.2 XD SELF DRILL 14 (Screw) ×2 IMPLANT
SCREW VAR 4.2 XD SELF DRILL 16 (Screw) ×18 IMPLANT
SPACER C HEDRON 12X14 6 7D (Spacer) ×4 IMPLANT
SPACER C HEDRON 12X14 7M 7D (Spacer) ×4 IMPLANT
SPOGE SURGIFLO 8M (HEMOSTASIS) ×1
SPONGE KITTNER 5P (MISCELLANEOUS) ×2 IMPLANT
SPONGE SURGIFLO 8M (HEMOSTASIS) ×1 IMPLANT
SUT SILK 2 0 (SUTURE) ×1
SUT SILK 2-0 18XBRD TIE 12 (SUTURE) ×1 IMPLANT
SUT V-LOC 90 ABS DVC 3-0 CL (SUTURE) ×2 IMPLANT
SUT VIC AB 3-0 SH 8-18 (SUTURE) ×2 IMPLANT
SYR 30ML LL (SYRINGE) ×2 IMPLANT
TAPE CLOTH 3X10 WHT NS LF (GAUZE/BANDAGES/DRESSINGS) ×2 IMPLANT
TOWEL OR 17X26 4PK STRL BLUE (TOWEL DISPOSABLE) ×4 IMPLANT
TRAY FOLEY MTR SLVR 16FR STAT (SET/KITS/TRAYS/PACK) ×2 IMPLANT
TUBING CONNECTING 10 (TUBING) ×2 IMPLANT

## 2019-11-28 NOTE — Op Note (Signed)
Indications: Shelby Colon is suffering from cervical myelopathy. she failed conservative management, and elected to proceed with surgery.  Findings: severe cervical stenosis  Preoperative Diagnosis: Cervical Myelopathy G95.9 Postoperative Diagnosis: same   EBL: 100 ml IVF: 1400 ml Drains: none Disposition: Extubated and Stable to PACU Complications: none  A foley catheter was placed.   Preoperative Note:   Risks of surgery discussed include: infection, bleeding, stroke, coma, death, paralysis, CSF leak, nerve/spinal cord injury, numbness, tingling, weakness, complex regional pain syndrome, recurrent stenosis and/or disc herniation, vascular injury, development of instability, neck/back pain, need for further surgery, persistent symptoms, development of deformity, and the risks of anesthesia. The patient understood these risks and agreed to proceed.  Operative Note:   Procedure: 1. Anterior Cervical Discectomy C3-4 including bilateral foraminotomies and end plate preparation  2. Anterior Cervical Discectomy C4-5 including bilateral foraminotomies and end plate preparation  3. Anterior Cervical Discectomy C5-6 including bilateral foraminotomies and end plate preparation  4. Anterior Cervical Discectomy C6-7 including bilateral foraminotomies and end plate preparation 5. Anterior Spinal Instrumentation C3 to 7 using Globus Xtend 6. Anterior arthrodesis from C3 to C7 with placement of biomechanical devices at C3-4, C4-5, C5-6, and C6-7 (Globus Hedron) 7. Use of the operative microscope 8. Use of intraoperative flouroscopy  PROCEDURE IN DETAIL: After obtaining informed consent, the patient taken to the operating room, placed in supine position, general anesthesia induced.  The patient had a small shoulder roll placed behind their shoulders.  The patient received preop antibiotics and IV Decadron.  The patient had a neck incision outlined, was prepped and draped in usual sterile fashion.  The incision was injected with local anesthetic.   An incision was opened, dissection taken down medial to the carotid artery and jugular vein, lateral to the trachea and esophagus.  The prevertebral fascia identified and a localizing x-ray demonstrated the correct level.  The longus colli were dissected laterally, and self-retaining retractors placed to open the operative field. The microscope was then brought into the field.    Caspar pins were placed into the vertebral bodies at C3 and C5. The distractor was then placed at C3-5, and the annuli at C3-4 and C4-5 were opened using a bovie.  Curettes and pituitary rongeurs used to remove the majority of disk, then the drill was used to remove the posterior osteophyte and begin the foraminotomies. The nerve hook was used to elevate the posterior longitudinal ligament, which was then removed with Kerrison rongeurs. The microblunt nerve hook could be passed out the foramen bilaterally at each level.   Meticulous hemostasis was obtained. A biomechanical device (Globus Hedron 7 mm height x 14 mm width by 12 mm depth) was placed at C3-4. A second biomechanical device (Globus Hedron 6 mm height x 14 mm width by 12 mm depth) was placed at C4-5. Each device had been filled with allograft for aid in arthrodesis.      The distractor was removed, and the [place] retractor repositioned. The pin at C3 was removed and bone wax was used for hemostasis. An additional pin was placed at C7.  The distractor was placed from C5-7, and the annuli at C5-6 and C6-7 were opened using a bovie.  Curettes and pituitary rongeurs used to remove the majority of disk, then the drill was used to remove the posterior osteophyte and begin the foraminotomies. The nerve hook was used to elevate the posterior longitudinal ligament, which was then removed with Kerrison rongeurs. The microblunt nerve hook could be passed out  the foramen bilaterally at each level.   Meticulous hemostasis was  obtained. A biomechanical device (Globus Hedron 6 mm height x 14 mm width by 12 mm depth)was placed at C5-6. A second biomechanical device (Globus Hedron 7 mm height x 14 mm width by 12 mm depth)was placed at C6-7. Each device had been filled with allograft for aid in arthrodesis.  The caspar distractor was removed and the pins at C5 and C7 were removed.  Bone wax was used for hemostasis.    A five segment, four level plate (60 mm Globus Xtend) was chosen.  Two screws placed in the vertebral bodies of all four segments, respectively making sure the screws were behind the locking mechanism.  Final AP and lateral radiographs were taken.   With everything in good position, the wound was irrigated copiously with bacitracin-containing solution and meticulous hemostasis obtained.  Wound was closed in 2 layers using interrupted inverted 3-0 Vicryl sutures in the platysma and 3-0 monocryl in the skin.  The wound was dressed with dermabond, the head of bed at 30 degrees, taken to recovery room in stable condition.  No new postop neurological deficits were identified.  All counts were correct at the end of the case.   Neuromonitoring was used throughout with no changes.   I performed the entire procedure with the assistance of Lonell Face NP as an Pensions consultant.  Meade Maw MD

## 2019-11-28 NOTE — Anesthesia Preprocedure Evaluation (Signed)
Anesthesia Evaluation  Patient identified by MRN, date of birth, ID band Patient awake    Reviewed: Allergy & Precautions, H&P , NPO status , Patient's Chart, lab work & pertinent test results, reviewed documented beta blocker date and time   Airway Mallampati: I  TM Distance: >3 FB Neck ROM: full    Dental no notable dental hx. (+) Teeth Intact, Dental Advisory Given   Pulmonary neg pulmonary ROS,    Pulmonary exam normal breath sounds clear to auscultation       Cardiovascular Exercise Tolerance: Good hypertension, Pt. on medications Normal cardiovascular exam Rhythm:regular Rate:Normal - Systolic murmurs    Neuro/Psych  Headaches, PSYCHIATRIC DISORDERS Anxiety Depression  Neuromuscular disease    GI/Hepatic negative GI ROS, Neg liver ROS,   Endo/Other  negative endocrine ROS  Renal/GU Renal disease  negative genitourinary   Musculoskeletal   Abdominal   Peds  Hematology  (+) Blood dyscrasia, anemia ,   Anesthesia Other Findings Past Medical History: No date: Abnormal Pap smear of cervix     Comment:  h/o LSIL pap with HPV +; 01/2019 pap negative negative               HPV No date: Anxiety No date: Atrial fibrillation (HCC) No date: B12 deficiency No date: Chronic pain syndrome No date: Colon polyps No date: Congenital absence of right kidney No date: Degeneration of lumbar intervertebral disc No date: Depression No date: Early menopause No date: Gastric bypass status for obesity     Comment:  2013, weight 258lb No date: Headache No date: Heart murmur     Comment:  MVP No date: History of chicken pox No date: Hypercholesterolemia No date: Hypertension No date: Iron deficiency 03/02/2019: Iron deficiency anemia No date: Neuropathy No date: Rheumatoid arthritis ([place])     Comment:  as child  No date: Scoliosis of lumbar spine No date: Solitary kidney, congenital     Comment:  pt thinks has left kidney   No date: Thyroid disease     Comment:  in the past  No date: UTI (urinary tract infection) No date: Vitamin D deficiency Past Surgical History: 2009: AUGMENTATION MAMMAPLASTY; Bilateral 11/04/2014: BACK SURGERY     Comment:  fusion of spine of lumbosacral region 2 rods and 16               screws  No date: BREAST ENHANCEMENT SURGERY 01/06/2018: COLONOSCOPY WITH PROPOFOL; N/A     Comment:  Procedure: COLONOSCOPY WITH PROPOFOL;  Surgeon:               Virgel Manifold, MD;  Location: [facility] ENDOSCOPY;                Service: Endoscopy;  Laterality: N/A; No date: excess skin removal No date: KNEE ARTHROSCOPY; Bilateral     Comment:  x 7 knees b/l (2 surgeries on right knee) total 1 bone               graft and 5 arthroscopy total knee  No date: ROUX-EN-Y PROCEDURE No date: TOTAL KNEE ARTHROPLASTY     Comment:  left 09/21/18 [name] [place]    Body Mass Index: 27.07 kg/m     Reproductive/Obstetrics negative OB ROS                             Anesthesia Physical  Anesthesia Plan  ASA: II  Anesthesia Plan: General  Post-op Pain Management:    Induction: Intravenous  PONV Risk Score and Plan: 4 or greater and Ondansetron, Dexamethasone, Midazolam, Propofol infusion and TIVA  Airway Management Planned: Oral ETT and Video Laryngoscope Planned  Additional Equipment: None  Intra-op Plan:   Post-operative Plan: Extubation in OR  Informed Consent: I have reviewed the patients History and Physical, chart, labs and discussed the procedure including the risks, benefits and alternatives for the proposed anesthesia with the patient or authorized representative who has indicated his/her understanding and acceptance.     Dental Advisory Given  Plan Discussed with: CRNA  Anesthesia Plan Comments: (Discussed risks of anesthesia with patient, including PONV, sore throat, lip/dental damage. Rare risks discussed as well, such as  cardiorespiratory and neurological sequelae. Patient understands.)        Anesthesia Quick Evaluation

## 2019-11-28 NOTE — H&P (Signed)
I have reviewed and confirmed my history and physical from 11/01/2019 with no additions or changes. Plan for C3-7 ACDF.  Risks and benefits reviewed.  Heart sounds normal no MRG. Chest Clear to Auscultation Bilaterally.

## 2019-11-28 NOTE — Transfer of Care (Signed)
Immediate Anesthesia Transfer of Care Note  Patient: Shelby Colon  Procedure(s) Performed: ANTERIOR CERVICAL DECOMPRESSION/DISCECTOMY FUSION 4 LEVELS C3-7 (N/A )  Patient Location: PACU  Anesthesia Type:General  Level of Consciousness: drowsy and patient cooperative  Airway & Oxygen Therapy: Patient Spontanous Breathing  Post-op Assessment: Report given to RN and Post -op Vital signs reviewed and stable  Post vital signs: Reviewed and stable  Last Vitals:  Vitals Value Taken Time  BP 130/75 11/28/19 1542  Temp 36.3 C 11/28/19 1542  Pulse 85 11/28/19 1548  Resp 20 11/28/19 1548  SpO2 98 % 11/28/19 1548  Vitals shown include unvalidated device data.  Last Pain:  Vitals:   11/28/19 0856  TempSrc: Tympanic  PainSc: 0-No pain      Patients Stated Pain Goal: 0 (69/62/95 2841)  Complications: No complications documented.

## 2019-11-28 NOTE — Anesthesia Procedure Notes (Signed)
Procedure Name: Intubation Date/Time: 11/28/2019 12:23 PM Performed by: Lerry Liner, CRNA Pre-anesthesia Checklist: Patient identified, Emergency Drugs available, Suction available, Patient being monitored and Timeout performed Patient Re-evaluated:Patient Re-evaluated prior to induction Oxygen Delivery Method: Circle system utilized Preoxygenation: Pre-oxygenation with 100% oxygen Induction Type: IV induction Ventilation: Mask ventilation without difficulty Laryngoscope Size: Glidescope and 3 Grade View: Grade I Tube type: Oral Tube size: 6.5 mm Number of attempts: 1 Airway Equipment and Method: Stylet Placement Confirmation: ETT inserted through vocal cords under direct vision Secured at: 19 cm Tube secured with: Tape Dental Injury: Teeth and Oropharynx as per pre-operative assessment  Comments: Head/neck remained in neutral position throughout with no manipulation

## 2019-11-28 NOTE — Progress Notes (Addendum)
Pt in PACU complaining of intermittent pain in front of neck, back of neck, and shoulders with some tingling in hands. Pain fluctuates between a 2 and 10. IV Robaxin, fentanyl, and dilaudid given. Pt alert and oriented, VSS. No signs of swelling. Pt tearful at times. Pt tolerating sips of water. States seeing her husband will make her feel better. Husband [name] came to bedside. Pt reports pain has improved. States it is a 1 out of 10. Updated Ascencion Dike, NP, who will come to PACU to evaluate before transferring to the floor

## 2019-11-28 NOTE — Progress Notes (Signed)
Procedure: ACDF C3-7 Procedure date: 11/28/2019 Diagnosis: cervical myelopathy   History: Shelby Colon is s/p ACDF C3-7 POD: Tolerated procedure well. Evaluated in post op recovery still disoriented from anesthesia but able to answer questions and obey commands.   Physical Exam: Vitals:   11/28/19 1634 11/28/19 1647  BP:    Pulse: 78 73  Resp: (!) 34 (!) 31  Temp:    SpO2: 100% 100%    General: Alert and oriented, lying in bed Strength:5/5 throughout  Sensation: intact and symmetric throughout, although she reports that she does feel some tingling to her hands bilaterally (baseline from preop) Skin: incision to neck is clean, dry, intact, no evidence of hematoma or swelling noted  Data:  No results for input(s): NA, K, CL, CO2, BUN, CREATININE, LABGLOM, GLUCOSE, CALCIUM in the last 168 hours. No results for input(s): AST, ALT, ALKPHOS in the last 168 hours.  Invalid input(s): TBILI   No results for input(s): WBC, HGB, HCT, PLT in the last 168 hours. No results for input(s): APTT, INR in the last 168 hours.         Assessment/Plan:  Shelby Colon is POD0 s/p ACDF C3-7. Will continue to monitor, she will be admitted overnight.   - mobilize - pain control - PTOT - Brace - Imaging   Lonell Face, NP  Department of Neurosurgery

## 2019-11-28 NOTE — Progress Notes (Signed)
Pt arrived to the unit at this time. Report was received from [place], [place]. Pt was experiencing elevated pain and anxiety levels. Aspen cervical collar in place, covering dermabond surgical incision. Pt underwent an Anterior Cervical Decompression fusion between C3-C7. Pt is on Bedrest , HOB 30 degrees. Husband at bedside.

## 2019-11-29 ENCOUNTER — Ambulatory Visit: Payer: 59

## 2019-11-29 ENCOUNTER — Ambulatory Visit: Payer: 59 | Admitting: Oncology

## 2019-11-29 ENCOUNTER — Inpatient Hospital Stay: Payer: 59

## 2019-11-29 MED ORDER — DEXAMETHASONE SODIUM PHOSPHATE 4 MG/ML IJ SOLN
4.0000 mg | Freq: Three times a day (TID) | INTRAMUSCULAR | Status: DC
  Administered 2019-11-29 – 2019-11-30 (×2): 4 mg via INTRAVENOUS
  Filled 2019-11-29 (×3): qty 1

## 2019-11-29 MED ORDER — ALPRAZOLAM 0.5 MG PO TABS
1.0000 mg | ORAL_TABLET | Freq: Every day | ORAL | Status: DC
  Administered 2019-11-29: 1 mg via ORAL
  Filled 2019-11-29: qty 2

## 2019-11-29 NOTE — Progress Notes (Signed)
Physical Therapy Treatment Patient Details Name: Shelby Colon MRN: [id number] DOB: 1966/03/25 Today's Date: 11/29/2019    History of Present Illness Pt is a 54 yo female diagnosed with cervical myelopathy and is s/p C3-C7 ACDF.  PMH includes: B TKA, back surgery, HTN, anxiety, depression, A-fib, gatric bypass, scoliosis, and chronic pain syndrome.    PT Comments    Pt pleasant and motivated to participate during the session.  Pt ambulated with a SPC 2 x 150' with slow, cautious cadence but was steady without LOB.  Pt and spouse participated in stair training with pt able to ascend and descend 4 steps x 2 with seated rest break between sessions and with min to mod verbal cues for sequencing.  Spouse educated on proper guarding positioning.  Overall pt making good progress towards goals and will benefit from HHPT services upon discharge to safely address deficits listed in patient problem list for decreased caregiver assistance and eventual return to PLOF.     Follow Up Recommendations  Home health PT     Equipment Recommendations  None recommended by PT    Recommendations for Other Services       Precautions / Restrictions Precautions Precautions: Cervical;Fall Required Braces or Orthoses: Cervical Brace Cervical Brace:  (Aspen collar) Restrictions Weight Bearing Restrictions: No Other Position/Activity Restrictions: Aspen collar on at all times except when in bed and when eating.  Collar to be donned/doffed in sitting.    Mobility  Bed Mobility Overal bed mobility: Modified Independent Bed Mobility: Supine to Sit     Supine to sit: Modified independent (Device/Increase time)    General bed mobility comments: Min extra time only, no cues or physical assistance needed  Transfers Overall transfer level: Needs assistance Equipment used: Straight cane Transfers: Sit to/from Stand Sit to Stand: Supervision         General transfer comment: Good eccentric and concentric  control with min verbal cues for sequencing  Ambulation/Gait Ambulation/Gait assistance: Supervision Gait Distance (Feet): 150 Feet x 2 Assistive device: Straight cane Gait Pattern/deviations: Step-through pattern;Decreased step length - right;Decreased step length - left Gait velocity: decreased   General Gait Details: Slow cadence but steady without LOB   Stairs Stairs: Yes Stairs assistance: Min guard Stair Management: Forwards;Step to pattern;With cane;One rail Left Number of Stairs: 4 General stair comments: Ascend/descend 4 steps x 2 with min to mod verbal cues for sequencing with a SPC and hand rail   Wheelchair Mobility    Modified Rankin (Stroke Patients Only)       Balance Overall balance assessment: No apparent balance deficits (not formally assessed)                                          Cognition Arousal/Alertness: Awake/alert Behavior During Therapy: WFL for tasks assessed/performed Overall Cognitive Status: Within Functional Limits for tasks assessed                                        Exercises Total Joint Exercises Ankle Circles/Pumps: AROM;Strengthening;Both;10 reps Quad Sets: Strengthening;Both;10 reps Gluteal Sets: Strengthening;Both;10 reps Hip ABduction/ADduction: Strengthening;Both;5 reps Straight Leg Raises: Strengthening;Both;5 reps Long Arc Quad: Strengthening;AROM;Both;10 reps Marching in Standing: AROM;Both;10 reps;Standing Other Exercises Other Exercises: Cervical precaution education with pt and spouse Other Exercises: Aspen collar donning/doffing education with pt and  spouse Other Exercises: HEP education with pt for BLE APs, QS, GS, and LAQ x 10 each every 1-2 hours while in acute care    General Comments        Pertinent Vitals/Pain Pain Assessment: 0-10 Pain Score: 5  Pain Location: neck Pain Descriptors / Indicators: Sore Pain Intervention(s): Premedicated before session;Monitored  during session    [place] expects to be discharged to:: Private residence Living Arrangements: Spouse/significant other Available Help at Discharge: Family;Available 24 hours/day Type of Home: House Home Access: Stairs to enter Entrance Stairs-Rails: None Home Layout: Two level Home Equipment: Environmental consultant - 4 wheels;Cane - single point;Shower seat;Crutches Additional Comments: Toilet has arm rests    Prior Function Level of Independence: Independent with assistive device(s)      Comments: Mod Ind ambulation with a SPC, no recent falls but several "trips", Ind with ADLs with pt typically showering when spouse is home for safety   PT Goals (current goals can now be found in the care plan section) Acute Rehab PT Goals Patient Stated Goal: Decreased pain with movement PT Goal Formulation: With patient Time For Goal Achievement: 12/12/19 Potential to Achieve Goals: Good Progress towards PT goals: Progressing toward goals    Frequency    BID      PT Plan Current plan remains appropriate    Co-evaluation              AM-PAC PT "6 Clicks" Mobility   Outcome Measure  Help needed turning from your back to your side while in a flat bed without using bedrails?: None Help needed moving from lying on your back to sitting on the side of a flat bed without using bedrails?: None Help needed moving to and from a bed to a chair (including a wheelchair)?: A Little Help needed standing up from a chair using your arms (e.g., wheelchair or bedside chair)?: A Little Help needed to walk in hospital room?: A Little Help needed climbing 3-5 steps with a railing? : A Little 6 Click Score: 20    End of Session Equipment Utilized During Treatment: Gait belt;Cervical collar Activity Tolerance: Patient tolerated treatment well;No increased pain Patient left: with call bell/phone within reach;with SCD's reapplied;in chair;with chair alarm set;with family/visitor present Nurse  Communication: Mobility status PT Visit Diagnosis: Pain;Difficulty in walking, not elsewhere classified (R26.2) Pain - part of body:  (neck)     Time: 1517-6160 PT Time Calculation (min) (ACUTE ONLY): 38 min  Charges:  $Gait Training: 23-37 mins $Therapeutic Exercise: 8-22 mins $Therapeutic Activity: 8-22 mins                     D. Scott Hartleigh [place] PT, DPT 11/29/19, 5:31 PM

## 2019-11-29 NOTE — Anesthesia Postprocedure Evaluation (Signed)
Anesthesia Post Note  Patient: Niajah Sipos  Procedure(s) Performed: ANTERIOR CERVICAL DECOMPRESSION/DISCECTOMY FUSION 4 LEVELS C3-7 (N/A )  Patient location during evaluation: Other (patient floors) Anesthesia Type: General Level of consciousness: awake and alert Pain management: pain level controlled Vital Signs Assessment: post-procedure vital signs reviewed and stable Respiratory status: spontaneous breathing, nonlabored ventilation, respiratory function stable and patient connected to nasal cannula oxygen Cardiovascular status: blood pressure returned to baseline and stable Postop Assessment: no apparent nausea or vomiting Anesthetic complications: no   No complications documented.   Last Vitals:  Vitals:   11/28/19 2224 11/29/19 0212  BP: (!) 104/45 (!) 142/78  Pulse: 96 86  Resp: 18 16  Temp: 37.4 C 36.7 C  SpO2: 97% 98%    Last Pain:  Vitals:   11/29/19 0610  TempSrc:   PainSc: 7                  Arita Miss

## 2019-11-29 NOTE — Evaluation (Signed)
Physical Therapy Evaluation Patient Details Name: Shelby Colon MRN: [id number] DOB: 1965-09-18 Today's Date: 11/29/2019   History of Present Illness  Pt is a 54 yo female diagnosed with cervical myelopathy and is s/p C3-C7 ACDF.  PMH includes: B TKA, back surgery, HTN, anxiety, depression, A-fib, gatric bypass, scoliosis, and chronic pain syndrome.    Clinical Impression  Pt pleasant and motivated to participate during the session.  Pt performed well during the session with no adverse symptoms other than neck pain that did not increase with activity. Pt showed good BUE/BLE strength and coordination with the only noted impairment being to sensation to light touch in the L foot that the pt stated was chronic since a previous back surgery.  Pt was steady with transfers and gait although ambulation was slow and cautious.  Will progress amb and stair training as tolerated next session.  Pt will benefit from HHPT services upon discharge to safely address deficits listed in patient problem list for decreased caregiver assistance and eventual return to PLOF.      Follow Up Recommendations Home health PT    Equipment Recommendations  None recommended by PT    Recommendations for Other Services       Precautions / Restrictions Precautions Precautions: Cervical;Fall Required Braces or Orthoses: Cervical Brace Cervical Brace:  (Aspen collar) Restrictions Weight Bearing Restrictions: No Other Position/Activity Restrictions: Aspen collar on at all times except when in bed.  Collar to be donned/doffed in sitting.      Mobility  Bed Mobility Overal bed mobility: Needs Assistance Bed Mobility: Supine to Sit;Sit to Supine     Supine to sit: Supervision Sit to supine: Supervision   General bed mobility comments: Min verbal cues for sequencing  Transfers Overall transfer level: Needs assistance Equipment used: Rolling walker (2 wheeled) Transfers: Sit to/from Stand Sit to Stand:  Supervision         General transfer comment: Good eccentric and concentric control with min verbal cues for sequencing  Ambulation/Gait Ambulation/Gait assistance: Supervision Gait Distance (Feet): 200 Feet Assistive device: Rolling walker (2 wheeled) Gait Pattern/deviations: Step-through pattern;Decreased step length - right;Decreased step length - left Gait velocity: decreased   General Gait Details: Slow cadence but steady without LOB  Stairs            Wheelchair Mobility    Modified Rankin (Stroke Patients Only)       Balance Overall balance assessment: No apparent balance deficits (not formally assessed)                                           Pertinent Vitals/Pain Pain Assessment: 0-10 Pain Score: 6  Pain Location: neck Pain Descriptors / Indicators: Sore Pain Intervention(s): Premedicated before session;Monitored during session    Home Living Family/patient expects to be discharged to:: Private residence Living Arrangements: Spouse/significant other Available Help at Discharge: Family;Available 24 hours/day Type of Home: House Home Access: Stairs to enter Entrance Stairs-Rails: None Entrance Stairs-Number of Steps: 1 Home Layout: Two level Home Equipment: Walker - 4 wheels;Cane - single point;Shower seat;Crutches Additional Comments: Toilet has arm rests    Prior Function Level of Independence: Independent with assistive device(s)         Comments: Mod Ind ambulation with a SPC, no recent falls but several "trips", Ind with ADLs with pt typically showering when spouse is home for safety     Hand  Dominance   Dominant Hand: Right    Extremity/Trunk Assessment   Upper Extremity Assessment Upper Extremity Assessment: Overall WFL for tasks assessed;RUE deficits/detail;LUE deficits/detail RUE Deficits / Details: RUE strength WNL RUE Sensation: WNL RUE Coordination: WNL LUE Deficits / Details: LUE strength WNL LUE  Sensation: WNL LUE Coordination: WNL    Lower Extremity Assessment Lower Extremity Assessment: Overall WFL for tasks assessed;LLE deficits/detail;RLE deficits/detail RLE Deficits / Details: RLE strength WNL RLE Sensation: WNL RLE Coordination: WNL LLE Deficits / Details: LLE strength WNL; mild decrease to light touch to medial L foot that is chronic from prior back surgery per patient LLE Sensation: decreased light touch LLE Coordination: WNL       Communication   Communication: No difficulties  Cognition Arousal/Alertness: Awake/alert Behavior During Therapy: WFL for tasks assessed/performed Overall Cognitive Status: Within Functional Limits for tasks assessed                                        General Comments      Exercises Total Joint Exercises Ankle Circles/Pumps: AROM;Strengthening;Both;10 reps Quad Sets: Strengthening;Both;10 reps Gluteal Sets: Strengthening;Both;10 reps Hip ABduction/ADduction: Strengthening;Both;5 reps Straight Leg Raises: Strengthening;Both;5 reps Long Arc Quad: Strengthening;AROM;Both;10 reps Marching in Standing: AROM;Both;10 reps;Standing Other Exercises Other Exercises: Cervical precaution education with pt and spouse Other Exercises: Camera operator education with pt and spouse Other Exercises: HEP education with pt for BLE APs, QS, GS, and LAQ x 10 each every 1-2 hours while in acute care   Assessment/Plan    PT Assessment Patient needs continued PT services  PT Problem List Decreased activity tolerance;Decreased knowledge of use of DME;Decreased knowledge of precautions;Pain;Decreased safety awareness       PT Treatment Interventions DME instruction;Gait training;Stair training;Functional mobility training;Therapeutic activities;Therapeutic exercise;Balance training;Patient/family education    PT Goals (Current goals can be found in the Care Plan section)  Acute Rehab PT Goals Patient Stated Goal:  Decreased pain with movement PT Goal Formulation: With patient Time For Goal Achievement: 12/12/19 Potential to Achieve Goals: Good    Frequency BID   Barriers to discharge        Co-evaluation               AM-PAC PT "6 Clicks" Mobility  Outcome Measure Help needed turning from your back to your side while in a flat bed without using bedrails?: A Little Help needed moving from lying on your back to sitting on the side of a flat bed without using bedrails?: A Little Help needed moving to and from a bed to a chair (including a wheelchair)?: A Little Help needed standing up from a chair using your arms (e.g., wheelchair or bedside chair)?: A Little Help needed to walk in hospital room?: A Little Help needed climbing 3-5 steps with a railing? : A Little 6 Click Score: 18    End of Session Equipment Utilized During Treatment: Gait belt;Cervical collar Activity Tolerance: Patient tolerated treatment well;No increased pain Patient left: in bed;with call bell/phone within reach;with bed alarm set;with SCD's reapplied Nurse Communication: Mobility status PT Visit Diagnosis: Pain;Difficulty in walking, not elsewhere classified (R26.2) Pain - part of body:  (neck)    Time: 3086-5784 PT Time Calculation (min) (ACUTE ONLY): 58 min   Charges:   PT Evaluation $PT Eval Moderate Complexity: 1 Mod PT Treatments $Therapeutic Exercise: 8-22 mins $Therapeutic Activity: 8-22 mins  Linus Salmons PT, DPT 11/29/19, 3:18 PM

## 2019-11-29 NOTE — Progress Notes (Signed)
Procedure: ACDF C3-7 Procedure date: 11/28/2019 Diagnosis: cervical myelopathy   History: Shelby Colon is s/p ACDF C3-7  POD1: Shelby Colon reports that she is doing well this morning. She has ambulated at the side of the bed and will be working with PT today. Her pain is moderately well controlled and she has been tolerating some PO, does endorse some difficulty swallowing but no more than expected after surgery. There is no hematoma at the incision site.   POD: Tolerated procedure well. Evaluated in post op recovery still disoriented from anesthesia but able to answer questions and obey commands.   Physical Exam: Vitals:   11/29/19 1133 11/29/19 1606  BP: (!) 141/85 (!) 153/91  Pulse: 91 89  Resp: 16 16  Temp: 98.2 F (36.8 C) 98.4 F (36.9 C)  SpO2: 100% 98%    General: Alert and oriented, lying in bed Strength:5/5 throughout  Sensation: intact and symmetric throughout, although she reports that she does feel some tingling to her hands bilaterally (baseline from preop) Skin: incision to neck is clean, dry, intact, no evidence of hematoma or swelling noted  Data:  No results for input(s): NA, K, CL, CO2, BUN, CREATININE, LABGLOM, GLUCOSE, CALCIUM in the last 168 hours. No results for input(s): AST, ALT, ALKPHOS in the last 168 hours.  Invalid input(s): TBILI   No results for input(s): WBC, HGB, HCT, PLT in the last 168 hours. No results for input(s): APTT, INR in the last 168 hours.         Assessment/Plan:  Shelby Colon is POD1 s/p ACDF C3-7.  She will work with PT today and will likely remain in the hospital overnight for continued monitoring.   - mobilize - pain control - PT - Brace when OOB - post op imaging - Soft diet as tolerated  Lonell Face, NP  Department of Neurosurgery

## 2019-11-29 NOTE — Progress Notes (Signed)
Pt was transported via bed to radiology for neck xray.

## 2019-11-29 NOTE — Progress Notes (Signed)
Ch visited with Pt and Pt's husband [name] as part of routine rounding. Pt said she needs prayers. Pt explained to Ch her multiple surgeries, and hopes that this will be the last surgery before some normalcy can be achieved. Pt says that this is her fourteenth surgery and she is a bionic woman now. Ch provided ministry of presence and support listening to their stories. Ch prayed with them. They were grateful for prayer and visit. Ch let them know about 24/7 chaplain availability.

## 2019-11-30 MED ORDER — METHYLPREDNISOLONE 4 MG PO TBPK: 8.0000 mg | ORAL_TABLET | Freq: Every morning | ORAL | Status: DC

## 2019-11-30 MED ORDER — ACETAMINOPHEN 500 MG PO TABS: 1000.0000 mg | ORAL_TABLET | Freq: Three times a day (TID) | ORAL | 0 refills | Status: DC | PRN

## 2019-11-30 MED ORDER — METHYLPREDNISOLONE 4 MG PO TBPK: 4.0000 mg | ORAL_TABLET | Freq: Three times a day (TID) | ORAL | Status: DC

## 2019-11-30 MED ORDER — ACETAMINOPHEN 500 MG PO TABS: 1000.0000 mg | ORAL_TABLET | Freq: Four times a day (QID) | ORAL | Status: DC

## 2019-11-30 MED ORDER — PHENOL 1.4 % MT LIQD: 1.0000 | OROMUCOSAL | 0 refills | Status: DC | PRN

## 2019-11-30 MED ORDER — METHYLPREDNISOLONE 4 MG PO TBPK: 8.0000 mg | ORAL_TABLET | Freq: Every evening | ORAL | Status: DC

## 2019-11-30 MED ORDER — SENNA 8.6 MG PO TABS: 1.0000 | ORAL_TABLET | Freq: Two times a day (BID) | ORAL | 0 refills | Status: DC

## 2019-11-30 MED ORDER — OXYCODONE HCL 5 MG/5ML PO SOLN: 5.0000 mg | ORAL | 0 refills | Status: DC | PRN

## 2019-11-30 MED ORDER — METHYLPREDNISOLONE 4 MG PO TBPK
ORAL_TABLET | ORAL | 0 refills | Status: DC
Start: 2019-11-30 — End: 2020-06-10

## 2019-11-30 MED ORDER — POLYETHYLENE GLYCOL 3350 17 G PO PACK: 17.0000 g | PACK | Freq: Every day | ORAL | 0 refills | Status: DC | PRN

## 2019-11-30 MED ORDER — METHYLPREDNISOLONE 4 MG PO TBPK: 4.0000 mg | ORAL_TABLET | ORAL | Status: DC

## 2019-11-30 MED ORDER — METHOCARBAMOL 750 MG PO TABS: 750.0000 mg | ORAL_TABLET | Freq: Four times a day (QID) | ORAL | 0 refills | Status: AC

## 2019-11-30 MED ORDER — MENTHOL 3 MG MT LOZG: 1.0000 | LOZENGE | OROMUCOSAL | 12 refills | Status: DC | PRN

## 2019-11-30 MED ORDER — METHYLPREDNISOLONE 4 MG PO TBPK: 4.0000 mg | ORAL_TABLET | Freq: Four times a day (QID) | ORAL | Status: DC

## 2019-11-30 NOTE — Discharge Instructions (Signed)
Your surgeon has performed an operation on your cervical spine (neck) to relieve pressure on the spinal cord and/or nerves. This involved making an incision in the front of your neck and removing one or more of the discs that support your spine. Next, a small piece of bone, a titanium plate, and screws were used to fuse two or more of the vertebrae (bones) together.  The following are instructions to help in your recovery once you have been discharged from the hospital. Even if you feel well, it is important that you follow these activity guidelines. If you do not let your neck heal properly from the surgery, you can increase the chance of return of your symptoms and other complications.  * Do not take anti-inflammatory medications for 3 months after surgery (naproxen [Aleve], ibuprofen [Advil, Motrin], etc.). These medications can prevent your bones from healing properly.  Activity    No bending, lifting, or twisting ("BLT"). Avoid lifting objects heavier than 10 pounds (gallon milk jug).  Where possible, avoid household activities that involve lifting, bending, reaching, pushing, or pulling such as laundry, vacuuming, grocery shopping, and childcare. Try to arrange for help from friends and family for these activities while your back heals.  Increase physical activity slowly as tolerated.  Taking short walks is encouraged, but avoid strenuous exercise. Do not jog, run, bicycle, lift weights, or participate in any other exercises unless specifically allowed by your doctor.  Talk to your doctor before resuming sexual activity.  You should not drive until cleared by your doctor.  Until released by your doctor, you should not return to work or school.  You should rest at home and let your body heal.   You may shower three days after your surgery.  After showering, lightly dab your incision dry. Do not take a tub bath or go swimming until approved by your doctor at your follow-up appointment.  If  your doctor ordered a cervical collar (neck brace) for you, you should wear it whenever you are out of bed. You may remove it when lying down or sleeping, but you should wear it at all other times. Not all neck surgeries require a cervical collar.  If you smoke, we strongly recommend that you quit.  Smoking has been proven to interfere with normal bone healing and will dramatically reduce the success rate of your surgery. Please contact [facility] (800-QUIT-NOW) and use the resources at www.[facility].com for assistance in stopping smoking.  Surgical Incision   If you have a dressing on your incision, you may remove it two days after your surgery. Keep your incision area clean and dry.  If you have staples or stitches on your incision, you should have a follow up scheduled for removal. If you do not have staples or stitches, you will have steri-strips (small pieces of surgical tape) or Dermabond glue. The steri-strips/glue should begin to peel away within about a week (it is fine if the steri-strips fall off before then). If the strips are still in place one week after your surgery, you may gently remove them.  Diet           You may return to your usual diet. However, you may experience discomfort when swallowing in the first month after your surgery. This is normal. You may find that softer foods are more comfortable for you to swallow. Be sure to stay hydrated.  When to Contact us  You may experience pain in your neck and/or pain between your shoulder blades. This  is normal and should improve in the next few weeks with the help of pain medication, muscle relaxers, and rest. Some patients report that a warm compress on the back of the neck or between the shoulder blades helps. ° °However, should you experience any of the following, contact us immediately: °• New numbness or weakness °• Pain that is progressively getting worse, and is not relieved by your pain medication, muscle relaxers, rest, and  warm compresses °• Bleeding, redness, swelling, pain, or drainage from surgical incision °• Chills or flu-like symptoms °• Fever greater than 101.0 F (38.3 C) °• Inability to eat, drink fluids, or take medications °• Problems with bowel or bladder functions °• Difficulty breathing or shortness of breath °• Warmth, tenderness, or swelling in your calf °Contact Information °• During office hours (Monday-Friday 9 am to 5 pm), please call your physician at [phone] and ask for Kendelyn Jean °• After hours and weekends, please call [phone] and an answering service will put you in touch with either Dr. Cook or Dr. Yarbrough.  °• For a life-threatening emergency, call 911 ° ° °

## 2019-11-30 NOTE — Progress Notes (Signed)
Patient is stable and ready for discharge. Patient's IV removed. Writer went over discharge paperwork with patient and husband and both verbalized understanding and all questions were answered by NP. Patient's belongings packed by patient's husband and he took to the car. Patient was transported via [facility] to private car without issues.

## 2019-11-30 NOTE — TOC Progression Note (Signed)
Transition of Care [facility]) - Progression Note    Patient Details  Name: Shelby Colon MRN: [id number] Date of Birth: 11/24/1965  [facility]) CM/SW Contact  Su Hilt, RN Phone Number: 11/30/2019, 12:13 PM  Clinical Narrative:     [facility] and stated they are unable to accept the patient, [place] with [facility] said they are not able to accept her Yetta Glassman is unable to accept her, I notified Altha Harm the PA and she is having the office set up Outpatient PT and they will call the patient with an appointment       Expected Discharge Plan and Services           Expected Discharge Date: 11/30/19                                     Social Determinants of Health (SDOH) Interventions    Readmission Risk Interventions No flowsheet data found.

## 2019-11-30 NOTE — Progress Notes (Signed)
Physical Therapy Treatment Patient Details Name: Shelby Colon MRN: [id number] DOB: 04-Dec-1965 Today's Date: 11/30/2019    History of Present Illness Pt is a 54 yo female diagnosed with cervical myelopathy and is s/p C3-C7 ACDF.  PMH includes: B TKA, back surgery, HTN, anxiety, depression, A-fib, gatric bypass, scoliosis, and chronic pain syndrome.    PT Comments    Pt pleasant and motivated to participate during the session.  Pt made good progress towards goals this session including improved speed and control with bed mobility tasks and transfers and improved confidence and gait quality during ambulation.  Pt continues to ambulate cautiously with decreased cadence and step length compared to age related norms but improved grossly compared to prior session. Pt was steady with good eccentric and concentric control ascending and descending stairs with min cues for sequencing using a SPC and a hand rail.  Pt remained somewhat anxious with stairs but much improved from prior session.  Pt will benefit from HHPT services upon discharge to safely address deficits listed in patient problem list for decreased caregiver assistance and eventual return to PLOF.     Follow Up Recommendations  Home health PT     Equipment Recommendations  None recommended by PT    Recommendations for Other Services       Precautions / Restrictions Precautions Precautions: Cervical;Fall Required Braces or Orthoses: Cervical Brace Restrictions Other Position/Activity Restrictions: Aspen collar on at all times except when in bed and when eating.  Collar to be donned/doffed in sitting.    Mobility  Bed Mobility Overal bed mobility: Modified Independent             General bed mobility comments: Min extra time only, no cues or physical assistance needed  Transfers Overall transfer level: Needs assistance Equipment used: Straight cane Transfers: Sit to/from Stand Sit to Stand: Supervision          General transfer comment: Good eccentric and concentric control with min verbal cues for sequencing  Ambulation/Gait Ambulation/Gait assistance: Supervision Gait Distance (Feet): 200 Feet Assistive device: Straight cane Gait Pattern/deviations: Step-through pattern;Decreased step length - right;Decreased step length - left Gait velocity: decreased   General Gait Details: Slow but grossly improved cadence and BLE step length and steady without LOB   Stairs   Stairs assistance: Supervision Stair Management: Forwards;Step to pattern;With cane;One rail Left Number of Stairs: 4 General stair comments: Ascend/descend 4 steps x 2 with min verbal cues for sequencing with a SPC and hand rail with good eccentric and concentric control   Wheelchair Mobility    Modified Rankin (Stroke Patients Only)       Balance Overall balance assessment: No apparent balance deficits (not formally assessed)                                          Cognition Arousal/Alertness: Awake/alert Behavior During Therapy: WFL for tasks assessed/performed Overall Cognitive Status: Within Functional Limits for tasks assessed                                        Exercises Total Joint Exercises Ankle Circles/Pumps: AROM;Strengthening;Both;10 reps Quad Sets: Strengthening;Both;10 reps Gluteal Sets: Strengthening;Both;10 reps Other Exercises Other Exercises: Cervical precaution review with pt    General Comments        Pertinent Vitals/Pain  Pain Assessment: 0-10 Pain Score: 6  Pain Location: neck Pain Descriptors / Indicators: Sore Pain Intervention(s): Premedicated before session;Monitored during session    Home Living                      Prior Function            PT Goals (current goals can now be found in the care plan section) Progress towards PT goals: Progressing toward goals    Frequency    BID      PT Plan Current plan remains  appropriate    Co-evaluation              AM-PAC PT "6 Clicks" Mobility   Outcome Measure  Help needed turning from your back to your side while in a flat bed without using bedrails?: None Help needed moving from lying on your back to sitting on the side of a flat bed without using bedrails?: None Help needed moving to and from a bed to a chair (including a wheelchair)?: A Little Help needed standing up from a chair using your arms (e.g., wheelchair or bedside chair)?: A Little Help needed to walk in hospital room?: A Little Help needed climbing 3-5 steps with a railing? : A Little 6 Click Score: 20    End of Session Equipment Utilized During Treatment: Gait belt;Cervical collar Activity Tolerance: Patient tolerated treatment well;No increased pain Patient left: in chair;with call bell/phone within reach;with chair alarm set Nurse Communication: Mobility status PT Visit Diagnosis: Pain;Difficulty in walking, not elsewhere classified (R26.2) Pain - part of body:  (neck)     Time: 5521-7471 PT Time Calculation (min) (ACUTE ONLY): 29 min  Charges:  $Gait Training: 23-37 mins                    D. Scott Lun Muro PT, DPT 11/30/19, 1:35 PM

## 2019-11-30 NOTE — TOC Progression Note (Signed)
Transition of Care [facility]) - Progression Note    Patient Details  Name: Shelby Colon MRN: [id number] Date of Birth: 06/24/1965  [facility]) CM/SW Contact  Su Hilt, RN Phone Number: 11/30/2019, 11:29 AM  Clinical Narrative:    Received a message from the [facility] is not able to see the patient, I spoke with Corene Cornea from [facility], they are not able to see the patient, The pA stated that she could be seen as late as Tuesday for PT, I reached out to [place] with [facility] and she will let me know        Expected Discharge Plan and Services           Expected Discharge Date: 11/30/19                                     Social Determinants of Health (SDOH) Interventions    Readmission Risk Interventions No flowsheet data found.

## 2019-11-30 NOTE — TOC Transition Note (Signed)
Transition of Care [facility]) - CM/SW Discharge Note   Patient Details  Name: Shelby Colon MRN: [id number] Date of Birth: 01-28-1966  [facility]) CM/SW Contact:  Su Hilt, RN Phone Number: 11/30/2019, 12:22 PM   Clinical Narrative:     Met with the patient and she has a cane at home and a rollator no other DME needed, she has transportation with her friend and her husband, she is ok with going to Outpatient PT and I explained the office will call for an appointment, she is agreeable        Patient Goals and CMS Choice        Discharge Placement                       Discharge Plan and Services                                     Social Determinants of Health (SDOH) Interventions     Readmission Risk Interventions No flowsheet data found.

## 2019-11-30 NOTE — Discharge Summary (Signed)
Procedure: ACDF C3-7 Procedure date: 11/28/2019 Diagnosis: cervical myelopathy   History: Shelby Colon is s/p ACDF C3-7  POD2: Shelby Colon was seen this morning, she is eager for discharge. She has been ambulating with PT and her pain is well controlled. She has been able to a soft diet with only minimal dysphagia.   POD1: Shelby Colon reports that she is doing well this morning. She has ambulated at the side of the bed and will be working with PT today. Her pain is moderately well controlled and she has been tolerating some PO, does endorse some difficulty swallowing but no more than expected after surgery. There is no hematoma at the incision site.   POD: Tolerated procedure well. Evaluated in post op recovery still disoriented from anesthesia but able to answer questions and obey commands.   Physical Exam: Vitals:   11/30/19 0517 11/30/19 0720  BP: (!) 155/97 (!) 143/83  Pulse: 82 80  Resp: 20 16  Temp: (!) 97.5 F (36.4 C) 98.2 F (36.8 C)  SpO2: 100% 100%    General: Alert and oriented, lying in bed Strength:5/5 throughout  Sensation: intact and symmetric throughout, although she reports that she does feel some tingling to her hands bilaterally (baseline from preop) Skin: incision to neck is clean, dry, intact, no evidence of hematoma or swelling noted  Data:  No results for input(s): NA, K, CL, CO2, BUN, CREATININE, LABGLOM, GLUCOSE, CALCIUM in the last 168 hours. No results for input(s): AST, ALT, ALKPHOS in the last 168 hours.  Invalid input(s): TBILI   No results for input(s): WBC, HGB, HCT, PLT in the last 168 hours. No results for input(s): APTT, INR in the last 168 hours.         Assessment/Plan:  Shelby Colon is POD2 s/p ACDF C3-7.   She has progressed well with PT and has been able to tolerate PO with only minimal dysphagia. Unfortunately, she was unable to have HHPT due to insurance concerns however we will set her up with outpatient PT.  She will be  discharged home with a Medrol dosepak.    Lonell Face, NP  Department of Neurosurgery

## 2019-12-20 ENCOUNTER — Encounter: Payer: Self-pay | Admitting: Neurosurgery

## 2019-12-27 ENCOUNTER — Other Ambulatory Visit: Payer: Self-pay | Admitting: Neurology

## 2019-12-27 ENCOUNTER — Other Ambulatory Visit (HOSPITAL_COMMUNITY): Payer: Self-pay | Admitting: Neurology

## 2019-12-27 DIAGNOSIS — R4189 Other symptoms and signs involving cognitive functions and awareness: Secondary | ICD-10-CM

## 2020-01-22 ENCOUNTER — Ambulatory Visit (HOSPITAL_COMMUNITY): Admission: RE | Admit: 2020-01-22 | Payer: 59 | Source: Ambulatory Visit

## 2020-02-15 ENCOUNTER — Ambulatory Visit (HOSPITAL_COMMUNITY)
Admission: RE | Admit: 2020-02-15 | Discharge: 2020-02-15 | Disposition: A | Discharge status: Home / Self Care | Payer: 59 | Source: Ambulatory Visit | Attending: Neurology | Admitting: Neurology

## 2020-02-15 ENCOUNTER — Other Ambulatory Visit: Payer: Self-pay

## 2020-02-15 DIAGNOSIS — R4189 Other symptoms and signs involving cognitive functions and awareness: Secondary | ICD-10-CM | POA: Insufficient documentation

## 2020-02-15 MED ORDER — GADOBUTROL 1 MMOL/ML IV SOLN
7.0000 mL | Freq: Once | INTRAVENOUS | Status: AC | PRN
  Administered 2020-02-15: 7 mL via INTRAVENOUS

## 2020-04-14 ENCOUNTER — Telehealth: Payer: Self-pay | Admitting: Internal Medicine

## 2020-04-14 NOTE — Telephone Encounter (Signed)
Noted I think she is changing to [facility] PCP

## 2020-04-14 NOTE — Telephone Encounter (Signed)
Received a call from [facility] fraud and abuse department.  Selinda Eon states that the Patient has been getting pain related medications from multiple providers.  Dr Olivia Mackie McLean-Scocuzza had prescribed cyclobenzaprine (FLEXERIL) 10 MG tablet For the patient. Patient then saw Lonell Face, NP and was prescribed Methocarbamol 750 mg. Flexeril was discontinued by NP Zdeb but Patient has continued to fill both medications.   Patient has been referred to pain management by Lonell Face, NP.   For your information

## 2020-04-14 NOTE — Telephone Encounter (Signed)
[facility] called wanting to give Flexaril 10mg  info

## 2020-04-15 NOTE — Telephone Encounter (Signed)
Faxed response to [facility] verification of prescriptions faxed on 04-14-2020

## 2020-04-17 ENCOUNTER — Other Ambulatory Visit: Payer: Self-pay | Admitting: Internal Medicine

## 2020-04-17 ENCOUNTER — Encounter: Payer: Self-pay | Admitting: Internal Medicine

## 2020-04-17 DIAGNOSIS — G4700 Insomnia, unspecified: Secondary | ICD-10-CM

## 2020-04-17 DIAGNOSIS — F419 Anxiety disorder, unspecified: Secondary | ICD-10-CM

## 2020-04-17 DIAGNOSIS — F32A Depression, unspecified: Secondary | ICD-10-CM

## 2020-04-17 MED ORDER — ALPRAZOLAM 0.25 MG PO TABS: 0.2500 mg | ORAL_TABLET | Freq: Every day | ORAL | 2 refills | Status: DC

## 2020-04-23 NOTE — Telephone Encounter (Signed)
Faxed to [facility] for [facility] refill authorization request for CYyclobenzaprine 10 mg tablet faxed on 04-22-2020

## 2020-05-26 ENCOUNTER — Emergency Department: Admission: EM | Admit: 2020-05-26 | Discharge: 2020-05-26 | Discharge status: Against Medical Advice | Payer: 59

## 2020-05-26 ENCOUNTER — Ambulatory Visit
Admission: EM | Admit: 2020-05-26 | Discharge: 2020-05-26 | Disposition: A | Discharge status: Home / Self Care | Payer: 59

## 2020-05-26 ENCOUNTER — Telehealth: Payer: Self-pay

## 2020-05-26 ENCOUNTER — Encounter: Payer: Self-pay | Admitting: Oncology

## 2020-05-26 NOTE — Telephone Encounter (Signed)
Received a MyChart message from patient requesting to get the 6/29 and 7/1 appts rescheduled.

## 2020-05-26 NOTE — ED Notes (Signed)
Pt called in the WR with no response 

## 2020-05-26 NOTE — Telephone Encounter (Signed)
Done. Pt is aware.

## 2020-05-26 NOTE — ED Notes (Signed)
Called patient at 3:14 pm with no answer.

## 2020-06-03 ENCOUNTER — Ambulatory Visit: Payer: 59 | Admitting: Student in an Organized Health Care Education/Training Program

## 2020-06-10 ENCOUNTER — Other Ambulatory Visit: Payer: Self-pay

## 2020-06-10 ENCOUNTER — Inpatient Hospital Stay: Payer: 59 | Attending: Oncology

## 2020-06-10 ENCOUNTER — Ambulatory Visit (INDEPENDENT_AMBULATORY_CARE_PROVIDER_SITE_OTHER): Payer: 59 | Admitting: Internal Medicine

## 2020-06-10 VITALS — BP 138/86 | HR 78 | Temp 98.1°F | Ht 62.0 in | Wt 149.1 lb

## 2020-06-10 DIAGNOSIS — D75839 Thrombocytosis, unspecified: Secondary | ICD-10-CM

## 2020-06-10 DIAGNOSIS — E2839 Other primary ovarian failure: Secondary | ICD-10-CM

## 2020-06-10 DIAGNOSIS — Z9884 Bariatric surgery status: Secondary | ICD-10-CM

## 2020-06-10 DIAGNOSIS — E611 Iron deficiency: Secondary | ICD-10-CM

## 2020-06-10 DIAGNOSIS — R011 Cardiac murmur, unspecified: Secondary | ICD-10-CM

## 2020-06-10 DIAGNOSIS — Z23 Encounter for immunization: Secondary | ICD-10-CM

## 2020-06-10 DIAGNOSIS — F339 Major depressive disorder, recurrent, unspecified: Secondary | ICD-10-CM

## 2020-06-10 DIAGNOSIS — G4700 Insomnia, unspecified: Secondary | ICD-10-CM

## 2020-06-10 DIAGNOSIS — D508 Other iron deficiency anemias: Secondary | ICD-10-CM | POA: Insufficient documentation

## 2020-06-10 DIAGNOSIS — F4310 Post-traumatic stress disorder, unspecified: Secondary | ICD-10-CM

## 2020-06-10 DIAGNOSIS — Z0000 Encounter for general adult medical examination without abnormal findings: Secondary | ICD-10-CM | POA: Diagnosis not present

## 2020-06-10 DIAGNOSIS — Z13220 Encounter for screening for lipoid disorders: Secondary | ICD-10-CM

## 2020-06-10 DIAGNOSIS — Z1231 Encounter for screening mammogram for malignant neoplasm of breast: Secondary | ICD-10-CM

## 2020-06-10 DIAGNOSIS — M8580 Other specified disorders of bone density and structure, unspecified site: Secondary | ICD-10-CM | POA: Diagnosis not present

## 2020-06-10 DIAGNOSIS — E559 Vitamin D deficiency, unspecified: Secondary | ICD-10-CM

## 2020-06-10 DIAGNOSIS — H60542 Acute eczematoid otitis externa, left ear: Secondary | ICD-10-CM

## 2020-06-10 DIAGNOSIS — Z1389 Encounter for screening for other disorder: Secondary | ICD-10-CM

## 2020-06-10 DIAGNOSIS — D509 Iron deficiency anemia, unspecified: Secondary | ICD-10-CM

## 2020-06-10 DIAGNOSIS — Z1329 Encounter for screening for other suspected endocrine disorder: Secondary | ICD-10-CM

## 2020-06-10 DIAGNOSIS — Z981 Arthrodesis status: Secondary | ICD-10-CM

## 2020-06-10 DIAGNOSIS — F419 Anxiety disorder, unspecified: Secondary | ICD-10-CM

## 2020-06-10 LAB — CBC WITH DIFFERENTIAL/PLATELET
Abs Immature Granulocytes: 0.04 10*3/uL (ref 0.00–0.07)
Basophils Absolute: 0.1 10*3/uL (ref 0.0–0.1)
Basophils Relative: 1 %
Eosinophils Absolute: 0.2 10*3/uL (ref 0.0–0.5)
Eosinophils Relative: 3 %
HCT: 33.3 % — ABNORMAL LOW (ref 36.0–46.0)
Hemoglobin: 10.5 g/dL — ABNORMAL LOW (ref 12.0–15.0)
Immature Granulocytes: 1 %
Lymphocytes Relative: 17 %
Lymphs Abs: 1.2 10*3/uL (ref 0.7–4.0)
MCH: 27.7 pg (ref 26.0–34.0)
MCHC: 31.5 g/dL (ref 30.0–36.0)
MCV: 87.9 fL (ref 80.0–100.0)
Monocytes Absolute: 0.5 10*3/uL (ref 0.1–1.0)
Monocytes Relative: 7 %
Neutro Abs: 4.8 10*3/uL (ref 1.7–7.7)
Neutrophils Relative %: 71 %
Platelets: 421 10*3/uL — ABNORMAL HIGH (ref 150–400)
RBC: 3.79 MIL/uL — ABNORMAL LOW (ref 3.87–5.11)
RDW: 14.2 % (ref 11.5–15.5)
WBC: 6.8 10*3/uL (ref 4.0–10.5)
nRBC: 0 % (ref 0.0–0.2)

## 2020-06-10 LAB — IRON AND TIBC
Iron: 46 ug/dL (ref 28–170)
Saturation Ratios: 15 % (ref 10.4–31.8)
TIBC: 321 ug/dL (ref 250–450)
UIBC: 270 ug/dL

## 2020-06-10 LAB — FERRITIN: Ferritin: 28 ng/mL (ref 11–307)

## 2020-06-10 MED ORDER — FLUOCINOLONE ACETONIDE 0.01 % OT OIL: 5.0000 "application " | TOPICAL_OIL | Freq: Two times a day (BID) | OTIC | 0 refills | Status: DC | PRN

## 2020-06-10 NOTE — Patient Instructions (Addendum)
Stress Relax Tranquil 1-2 at bedtime  [employer] or [employer]   Call to schedule mammogram and bone density    Cholelithiasis  Cholelithiasis is a disease in which gallstones form in the gallbladder. The gallbladder is an organ that stores bile. Bile is a fluid that helps to digest fats. Gallstones begin as small crystals and can slowly grow into stones. They may cause no symptoms until they block the gallbladder duct, or cystic duct, when the gallbladder tightens (contracts) after food is eaten. This can cause pain and is known as a gallbladder attack, or biliary colic. There are two main types of gallstones:  Cholesterol stones. These are the most common type of gallstone. These stones are made of hardened cholesterol and are usually yellow-green in color. Cholesterol is a fat-like substance that is made in the liver.  Pigment stones. These are dark in color and are made of a red-yellow substance, called bilirubin,that forms when hemoglobin from red blood cells breaks down. What are the causes? This condition may be caused by an imbalance in the different parts that make bile. This can happen if the bile:  Has too much bilirubin. This can happen in certain blood diseases, such as sickle cell anemia.  Has too much cholesterol.  Does not have enough bile salts. These salts help the body absorb and digest fats. In some cases, this condition can also be caused by the gallbladder not emptying completely or often enough. This is common during pregnancy. What increases the risk? The following factors may make you more likely to develop this condition:  Being female.  Having multiple pregnancies. Health care providers sometimes advise removing diseased gallbladders before future pregnancies.  Eating a diet that is heavy in fried foods, fat, and refined carbohydrates, such as white bread and white rice.  Being obese.  Being older than age 34.  Using medicines that contain female hormones  (estrogen) for a long time.  Losing weight quickly.  Having a family history of gallstones.  Having certain medical problems, such as: ? Diabetes mellitus. ? Cystic fibrosis. ? Crohn's disease. ? Cirrhosis or other long-term (chronic) liver disease. ? Certain blood diseases, such as sickle cell anemia or leukemia. What are the signs or symptoms? In many cases, having gallstones causes no symptoms. When you have gallstones but do not have symptoms, you have silent gallstones. If a gallstone blocks your bile duct, it can cause a gallbladder attack. The main symptom of a gallbladder attack is sudden pain in the upper right part of the abdomen. The pain:  Usually comes at night or after eating.  Can last for one hour or more.  Can spread to your right shoulder, back, or chest.  Can feel like indigestion. This is discomfort, burning, or fullness in your upper abdomen. If the bile duct is blocked for more than a few hours, it can cause an infection or inflammation of your gallbladder (cholecystitis), liver, or pancreas. This can cause:  Nausea or vomiting.  Bloating.  Pain in your abdomen that lasts for 5 hours or longer.  Tenderness in your upper abdomen, often in the upper right section and under your rib cage.  Fever or chills.  Skin or the white parts of your eyes turning yellow (jaundice). This usually happens when a stone has blocked bile from passing through the common bile duct.  Dark urine or light-colored stools. How is this diagnosed? This condition may be diagnosed based on:  A physical exam.  Your medical history.  Ultrasound.  CT scan.  MRI. You may also have other tests, including:  Blood tests to check for signs of an infection or inflammation.  Cholescintigraphy, or HIDA scan. This is a scan of your gallbladder and bile ducts (biliary system) using non-harmful radioactive material and special cameras that can see the radioactive material.  Endoscopic  retrograde cholangiopancreatogram. This involves inserting a small tube with a camera on the end (endoscope) through your mouth to look at bile ducts and check for blockages. How is this treated? Treatment for this condition depends on the severity of the condition. Silent gallstones do not need treatment. Treatment may be needed if a blockage causes a gallbladder attack or other symptoms. Treatment may include:  Home care, if symptoms are not severe. ? During a simple gallbladder attack, stop eating and drinking for 12-24 hours (except for water and clear liquids). This helps to "cool down" your gallbladder. After 1 or 2 days, you can start to eat a diet of simple or clear foods, such as broths and crackers. ? You may also need medicines for pain or nausea or both. ? If you have cholecystitis and an infection, you will need antibiotics.  A hospital stay, if needed for pain control or for cholecystitis with severe infection.  Cholecystectomy, or surgery to remove your gallbladder. This is the most common treatment if all other treatments have not worked.  Medicines to break up gallstones. These are most effective at treating small gallstones. Medicines may be used for up to 6-12 months.  Endoscopic retrograde cholangiopancreatogram. A small basket can be attached to the endoscope and used to capture and remove gallstones, mainly those that are in the common bile duct. Follow these instructions at home: Medicines  Take over-the-counter and prescription medicines only as told by your health care provider.  If you were prescribed an antibiotic medicine, take it as told by your health care provider. Do not stop taking the antibiotic even if you start to feel better.  Ask your health care provider if the medicine prescribed to you requires you to avoid driving or using machinery. Eating and drinking  Drink enough fluid to keep your urine pale yellow. This is important during a gallbladder  attack. Water and clear liquids are preferred.  Follow a healthy diet. This includes: ? Reducing fatty foods, such as fried food and foods high in cholesterol. ? Reducing refined carbohydrates, such as white bread and white rice. ? Eating more fiber. Aim for foods such as almonds, fruit, and beans. Alcohol use  If you drink alcohol: ? Limit how much you use to:  0-1 drink a day for nonpregnant women.  0-2 drinks a day for men. ? Be aware of how much alcohol is in your drink. In the [place] one drink equals one 12 oz bottle of beer (355 mL), one 5 oz glass of wine (148 mL), or one 1 oz glass of hard liquor (44 mL). General instructions  Do not use any products that contain nicotine or tobacco, such as cigarettes, e-cigarettes, and chewing tobacco. If you need help quitting, ask your health care provider.  Maintain a healthy weight.  Keep all follow-up visits as told by your health care provider. These may include consultations with a surgeon or specialist. This is important. Where to find more information  [employer] of Diabetes and Digestive and Kidney Diseases: [employer] Contact a health care provider if:  You think you have had a gallbladder attack.  You have been diagnosed with  silent gallstones and you develop pain in your abdomen or indigestion.  You begin to have attacks more often.  You have dark urine or light-colored stools. Get help right away if:  You have pain from a gallbladder attack that lasts for more than 2 hours.  You have pain in your abdomen that lasts for more than 5 hours or is getting worse.  You have a fever or chills.  You have nausea and vomiting that do not go away.  You develop jaundice. Summary  Cholelithiasis is a disease in which gallstones form in the gallbladder.  This condition may be caused by an imbalance in the different parts that make bile. This can happen if your bile has too much bilirubin or cholesterol, or does  not have enough bile salts.  Treatment for gallstones depends on the severity of the condition. Silent gallstones do not need treatment.  If gallstones cause a gallbladder attack or other symptoms, treatment usually involves not eating or drinking anything. Treatment may also include pain medicines and antibiotics, and it sometimes includes a hospital stay.  Surgery to remove the gallbladder is common if all other treatments have not worked. This information is not intended to replace advice given to you by your health care provider. Make sure you discuss any questions you have with your health care provider. Document Revised: 04/09/2019 Document Reviewed: 04/09/2019 Elsevier Patient Education  2021 [place] Prevention in the Home, Adult Falls can cause injuries and can affect people from all age groups. There are many simple things that you can do to make your home safe and to help prevent falls. Ask for help when making these changes, if needed. What actions can I take to prevent falls? General instructions  Use good lighting in all rooms. Replace any light bulbs that burn out.  Turn on lights if it is dark. Use night-lights.  Place frequently used items in easy-to-reach places. Lower the shelves around your home if necessary.  Set up furniture so that there are clear paths around it. Avoid moving your furniture around.  Remove throw rugs and other tripping hazards from the floor.  Avoid walking on wet floors.  Fix any uneven floor surfaces.  Add color or contrast paint or tape to grab bars and handrails in your home. Place contrasting color strips on the first and last steps of stairways.  When you use a stepladder, make sure that it is completely opened and that the sides are firmly locked. Have someone hold the ladder while you are using it. Do not climb a closed stepladder.  Be aware of any and all pets. What can I do in the bathroom?  Keep the floor dry.  Immediately clean up any water that spills onto the floor.  Remove soap buildup in the tub or shower on a regular basis.  Use non-skid mats or decals on the floor of the tub or shower.  Attach bath mats securely with double-sided, non-slip rug tape.  If you need to sit down while you are in the shower, use a plastic, non-slip stool.  Install grab bars by the toilet and in the tub and shower. Do not use towel bars as grab bars.      What can I do in the bedroom?  Make sure that a bedside light is easy to reach.  Do not use oversized bedding that drapes onto the floor.  Have a firm chair that has side arms to use for getting dressed.  What can I do in the kitchen?  Clean up any spills right away.  If you need to reach for something above you, use a sturdy step stool that has a grab bar.  Keep electrical cables out of the way.  Do not use floor polish or wax that makes floors slippery. If you must use wax, make sure that it is non-skid floor wax. What can I do in the stairways?  Do not leave any items on the stairs.  Make sure that you have a light switch at the top of the stairs and the bottom of the stairs. Have them installed if you do not have them.  Make sure that there are handrails on both sides of the stairs. Fix handrails that are broken or loose. Make sure that handrails are as long as the stairways.  Install non-slip stair treads on all stairs in your home.  Avoid having throw rugs at the top or bottom of stairways, or secure the rugs with carpet tape to prevent them from moving.  Choose a carpet design that does not hide the edge of steps on the stairway.  Check any carpeting to make sure that it is firmly attached to the stairs. Fix any carpet that is loose or worn. What can I do on the outside of my home?  Use bright outdoor lighting.  Regularly repair the edges of walkways and driveways and fix any cracks.  Remove high doorway thresholds.  Trim any  shrubbery on the main path into your home.  Regularly check that handrails are securely fastened and in good repair. Both sides of any steps should have handrails.  Install guardrails along the edges of any raised decks or porches.  Clear walkways of debris and clutter, including tools and rocks.  Have leaves, snow, and ice cleared regularly.  Use sand or salt on walkways during winter months.  In the garage, clean up any spills right away, including grease or oil spills. What other actions can I take?  Wear closed-toe shoes that fit well and support your feet. Wear shoes that have rubber soles or low heels.  Use mobility aids as needed, such as canes, walkers, scooters, and crutches.  Review your medicines with your health care provider. Some medicines can cause dizziness or changes in blood pressure, which increase your risk of falling. Talk with your health care provider about other ways that you can decrease your risk of falls. This may include working with a physical therapist or trainer to improve your strength, balance, and endurance. Where to find more information  Centers for Disease Control and Prevention, [employer]: [employer].za  [employer] on Aging: [employer] Contact a health care provider if:  You are afraid of falling at home.  You feel weak, drowsy, or dizzy at home.  You fall at home. Summary  There are many simple things that you can do to make your home safe and to help prevent falls.  Ways to make your home safe include removing tripping hazards and installing grab bars in the bathroom.  Ask for help when making these changes in your home. This information is not intended to replace advice given to you by your health care provider. Make sure you discuss any questions you have with your health care provider. Document Revised: 04/29/2017 Document Reviewed: 12/30/2016 Elsevier Patient Education  2021 [employer].
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
## 2020-06-11 ENCOUNTER — Encounter: Payer: Self-pay | Admitting: Oncology

## 2020-06-11 ENCOUNTER — Inpatient Hospital Stay (HOSPITAL_BASED_OUTPATIENT_CLINIC_OR_DEPARTMENT_OTHER): Payer: 59 | Admitting: Oncology

## 2020-06-11 ENCOUNTER — Encounter: Payer: Self-pay | Admitting: Internal Medicine

## 2020-06-11 ENCOUNTER — Inpatient Hospital Stay: Payer: 59

## 2020-06-11 VITALS — BP 149/89 | HR 74 | Resp 18

## 2020-06-11 VITALS — BP 148/82 | HR 78 | Temp 98.6°F | Resp 16 | Wt 150.4 lb

## 2020-06-11 DIAGNOSIS — D509 Iron deficiency anemia, unspecified: Secondary | ICD-10-CM

## 2020-06-11 DIAGNOSIS — Z9884 Bariatric surgery status: Secondary | ICD-10-CM

## 2020-06-11 DIAGNOSIS — D508 Other iron deficiency anemias: Secondary | ICD-10-CM

## 2020-06-11 MED ORDER — SODIUM CHLORIDE 0.9 % IV SOLN: 200.0000 mg | Freq: Once | INTRAVENOUS | Status: DC

## 2020-06-11 MED ORDER — IRON SUCROSE 20 MG/ML IV SOLN
200.0000 mg | Freq: Once | INTRAVENOUS | Status: AC
  Administered 2020-06-11: 200 mg via INTRAVENOUS
  Filled 2020-06-11: qty 10

## 2020-06-11 MED ORDER — SODIUM CHLORIDE 0.9 % IV SOLN
Freq: Once | INTRAVENOUS | Status: AC
  Filled 2020-06-11: qty 250

## 2020-06-11 NOTE — Progress Notes (Signed)
Hematology/Oncology follow up note [facility] [phone] [phone]   Patient Care Team: McLean-Scocuzza, Nino Glow, MD as PCP - General (Internal Medicine)  REFERRING PROVIDER: McLean-Scocuzza, Olivia Mackie *  CHIEF COMPLAINTS/REASON FOR VISIT:  Follow up for iron deficiency anemia.   HISTORY OF PRESENTING ILLNESS:  Shelby Colon is a 55 y.o. female who was seen in consultation at the request of McLean-Scocuzza, Olivia Mackie * for evaluation of thromcbocytosis Reviewed patient's labs.  02/15/2019 Labs showed elevated platelet counts at 453, Hb 11.3,  wbc 7.3.   Reviewed patient's previous labs. Thrombocytosis onset is chronic onset , duration since 2019.  No aggravating or elevated factors. Associated symptoms or signs:  Denies weight loss, fever, chills, fatigue, night sweats.  Context:  Smoking history: never smoker Family history of polycythemia. denies History of iron deficiency anemia. Yes.  History of DVT, denies History of gastric bypass. Yes.  Had a colonoscopy 03/19/2019.   INTERVAL HISTORY Shelby Colon is a 55 y.o. female who has above history reviewed by me today presents for follow up visit for management of iron deficiency anemia Problems and complaints are listed below: Patient presents to reestablish care.  Was last seen approximately 1 year ago. Has had previous IV Venofer treatments.  Tolerated well. She reports feeling more fatigued lately. For anxiety/depression, she has come off Xanax and feels better.  She follows up with psychiatrist and currently is on Cymbalta and gabapentin.   Review of Systems  Constitutional: Positive for fatigue. Negative for appetite change, chills and fever.  HENT:   Negative for hearing loss and voice change.   Eyes: Negative for eye problems.  Respiratory: Negative for chest tightness and cough.   Cardiovascular: Negative for chest pain.  Gastrointestinal: Negative for abdominal distention,  abdominal pain and blood in stool.  Endocrine: Negative for hot flashes.  Genitourinary: Negative for difficulty urinating and frequency.   Musculoskeletal: Negative for arthralgias.  Skin: Negative for itching and rash.  Neurological: Negative for extremity weakness.  Hematological: Negative for adenopathy.  Psychiatric/Behavioral: Negative for confusion.    MEDICAL HISTORY:  Past Medical History:  Diagnosis Date  . Abnormal Pap smear of cervix    h/o LSIL pap with HPV +; 01/2019 pap negative negative HPV  . Anxiety   . Atrial fibrillation ([place])   . B12 deficiency   . Chronic pain syndrome   . Colon polyps   . Congenital absence of right kidney   . Degeneration of lumbar intervertebral disc   . Depression   . Early menopause   . Gastric bypass status for obesity    2013, weight 258lb  . Headache   . Heart murmur    MVP  . History of chicken pox   . Hypercholesterolemia   . Hypertension   . Iron deficiency   . Iron deficiency anemia 03/02/2019  . Neuropathy   . Rheumatoid arthritis ([place])    as child   . Scoliosis of lumbar spine   . Solitary kidney, congenital    pt thinks has left kidney   . Thyroid disease    in the past   . UTI (urinary tract infection)   . Vitamin D deficiency     SURGICAL HISTORY: Past Surgical History:  Procedure Laterality Date  . ANTERIOR CERVICAL DECOMPRESSION/DISCECTOMY FUSION 4 LEVELS N/A 11/28/2019   Procedure: ANTERIOR CERVICAL DECOMPRESSION/DISCECTOMY FUSION 4 LEVELS C3-7;  Surgeon: Meade Maw, MD;  Location: [facility] ORS;  Service: Neurosurgery;  Laterality: N/A;  . AUGMENTATION MAMMAPLASTY Bilateral 2009  .  BACK SURGERY  11/04/2014   fusion of spine of lumbosacral region 2 rods and 16 screws   . BREAST ENHANCEMENT SURGERY    . COLONOSCOPY WITH PROPOFOL N/A 01/06/2018   Procedure: COLONOSCOPY WITH PROPOFOL;  Surgeon: Virgel Manifold, MD;  Location: [facility] ENDOSCOPY;  Service: Endoscopy;  Laterality: N/A;  . COLONOSCOPY WITH  PROPOFOL N/A 03/19/2019   Procedure: COLONOSCOPY WITH PROPOFOL;  Surgeon: Virgel Manifold, MD;  Location: [facility] ENDOSCOPY;  Service: Endoscopy;  Laterality: N/A;  . excess skin removal    . KNEE ARTHROSCOPY Bilateral    x 7 knees b/l (2 surgeries on right knee) total 1 bone graft and 5 arthroscopy total knee   . ROUX-EN-Y PROCEDURE    . TOTAL KNEE ARTHROPLASTY     left 09/21/18 [name] [name]    SOCIAL HISTORY: Social History   Socioeconomic History  . Marital status: Married    Spouse name: Not on file  . Number of children: Not on file  . Years of education: Not on file  . Highest education level: Not on file  Occupational History  . Not on file  Tobacco Use  . Smoking status: Never Smoker  . Smokeless tobacco: Never Used  Vaping Use  . Vaping Use: Never used  Substance and Sexual Activity  . Alcohol use: Yes    Comment: socially  . Drug use: No  . Sexual activity: Yes  Other Topics Concern  . Not on file  Social History Narrative   Married since 2012    No kids    From [place] moved to [facility] and now lives here    Currently unemployed used to be [place] unemployed since 04/2018    BA degree    No guns, wears seat belt, safe in relationship    Social Determinants of Radio broadcast assistant Strain: Not on file  Food Insecurity: Not on file  Transportation Needs: Not on file  Physical Activity: Not on file  Stress: Not on file  Social Connections: Not on file  Intimate Partner Violence: Not on file    FAMILY HISTORY: Family History  Adopted: Yes  Problem Relation Age of Onset  . Arthritis Mother   . Drug abuse Mother        heriod OD  . Early death Mother   . Diabetes Father   . Hypertension Father   . Depression Father   . Arthritis Father   . Drug abuse Father   . Diabetes Sister   . Fibromyalgia Sister   . Rashes / Skin problems Sister        PSORIASIS  . Diabetes  Brother   . Osteoarthritis Maternal Grandmother   . Aneurysm Maternal Aunt   . Thyroid disease Maternal Aunt   . Fibromyalgia Maternal Aunt   . Breast cancer Maternal Aunt   . Cancer Maternal Aunt        breast cancer     ALLERGIES:  is allergic to morphine.  MEDICATIONS:  Current Outpatient Medications  Medication Sig Dispense Refill  . Biotin w/ Vitamins C & E (HAIR/SKIN/NAILS PO) Take 2 tablets by mouth daily.    Marland Kitchen conjugated estrogens (PREMARIN) vaginal cream Place 1 Applicatorful vaginally 2 (two) times a week. 30 g 3  . cyclobenzaprine (FLEXERIL) 10 MG tablet Take 10 mg by mouth 3 (three) times daily.    . diclofenac Sodium (VOLTAREN) 1 % GEL Apply 1 application topically 4 (four) times  daily as needed (knee pain/lower back pain.).    Marland Kitchen DULoxetine (CYMBALTA) 20 MG capsule Take 1 capsule (20 mg total) by mouth daily. 90 capsule 3  . Fluocinolone Acetonide 0.01 % OIL Place 5 application in ear(s) 2 (two) times daily as needed. X 2 weeks left ear 20 mL 0  . gabapentin (NEURONTIN) 600 MG tablet 1 pill in am 1 pill with lunch and 2 at night (Patient taking differently: Take 600-1,200 mg by mouth See admin instructions. 1 pill in am 1 pill with lunch and 2 at night) 120 tablet 11  . hydroxychloroquine (PLAQUENIL) 200 MG tablet Take 200 mg by mouth 2 (two) times daily.    . NON FORMULARY Liquid vitamin Miracle 2000    . OVER THE COUNTER MEDICATION Take 1 Dose by mouth daily. Kratom     No current facility-administered medications for this visit.     PHYSICAL EXAMINATION: ECOG PERFORMANCE STATUS: 1 - Symptomatic but completely ambulatory Vitals:   06/11/20 1309  BP: (!) 148/82  Pulse: 78  Resp: 16  Temp: 98.6 F (37 C)   Filed Weights   06/11/20 1307 06/11/20 1309  Weight: 150 lb 6.4 oz (68.2 kg) 150 lb 6.4 oz (68.2 kg)    Physical Exam Constitutional:      General: She is not in acute distress. HENT:     Head: Normocephalic and atraumatic.  Eyes:     General: No  scleral icterus.    Pupils: Pupils are equal, round, and reactive to light.  Cardiovascular:     Rate and Rhythm: Normal rate and regular rhythm.     Heart sounds: Normal heart sounds.  Pulmonary:     Effort: Pulmonary effort is normal.  Abdominal:     General: Bowel sounds are normal. There is no distension.     Palpations: Abdomen is soft. There is no mass.     Tenderness: There is no abdominal tenderness.  Musculoskeletal:        General: No deformity. Normal range of motion.     Cervical back: Normal range of motion and neck supple.  Skin:    General: Skin is warm and dry.     Findings: No erythema or rash.  Neurological:     Mental Status: She is alert and oriented to person, place, and time.     Cranial Nerves: No cranial nerve deficit.     Coordination: Coordination normal.  Psychiatric:        Mood and Affect: Mood normal.        Judgment: Judgment normal.      LABORATORY DATA:  I have reviewed the data as listed Lab Results  Component Value Date   WBC 6.8 06/10/2020   HGB 10.5 (L) 06/10/2020   HCT 33.3 (L) 06/10/2020   MCV 87.9 06/10/2020   PLT 421 (H) 06/10/2020   Recent Labs    11/21/19 0907  NA 141  K 3.7  CL 102  CO2 29  GLUCOSE 94  BUN 14  CREATININE 0.88  CALCIUM 8.9  GFRNONAA >60  GFRAA >60   Iron/TIBC/Ferritin/ %Sat    Component Value Date/Time   IRON 46 06/10/2020 1341   IRON 35 02/15/2019 1004   TIBC 321 06/10/2020 1341   TIBC 286 02/15/2019 1004   FERRITIN 28 06/10/2020 1341   FERRITIN 38 02/15/2019 1004   IRONPCTSAT 15 06/10/2020 1341   IRONPCTSAT 12 (L) 02/15/2019 1004   IRONPCTSAT 31 12/05/2017 1044      RADIOGRAPHIC STUDIES:  I have personally reviewed the radiological images as listed and agreed with the findings in the report. No results found.    ASSESSMENT & PLAN:  1. Other iron deficiency anemia   2. History of Roux-en-Y gastric bypass   Labs reviewed and discussed with patient Hemoglobin has decreased to  10.5 Iron panel showed ferritin decreased to 28, borderline iron saturation 15. Recommend patient to proceed with IV Venofer 200 mg x 2.   Given her history of gastric bypass, she is at high risk of developing iron deficiency.  I recommend patient to check blood work and follow-up with me every 6 months for maintenance IV iron. She agrees with the plan.  Anxiety/depression, continue follow-up with psychiatrist. All questions were answered. The patient knows to call the clinic with any problems questions or concerns.  Cc McLean-Scocuzza, Olivia Mackie *  Return of visit: 6 months  Earlie Server, MD, PhD 06/11/2020

## 2020-06-11 NOTE — Progress Notes (Signed)
Patient here for follow up she reports insomnia and increased tiredness.

## 2020-06-11 NOTE — Progress Notes (Signed)
1443- Patient tolerated Venofer infusion well. Patient and vital signs stable. Patient discharged to home at this time.  

## 2020-06-11 NOTE — Addendum Note (Signed)
Addended by: Evelina Dun on: 06/11/2020 01:46 PM   Modules accepted: Orders

## 2020-06-12 ENCOUNTER — Telehealth: Payer: Self-pay | Admitting: Internal Medicine

## 2020-06-12 ENCOUNTER — Encounter: Payer: Self-pay | Admitting: Internal Medicine

## 2020-06-12 DIAGNOSIS — F419 Anxiety disorder, unspecified: Secondary | ICD-10-CM | POA: Insufficient documentation

## 2020-06-12 DIAGNOSIS — Z0000 Encounter for general adult medical examination without abnormal findings: Secondary | ICD-10-CM

## 2020-06-12 DIAGNOSIS — F339 Major depressive disorder, recurrent, unspecified: Secondary | ICD-10-CM | POA: Insufficient documentation

## 2020-06-12 DIAGNOSIS — F4310 Post-traumatic stress disorder, unspecified: Secondary | ICD-10-CM | POA: Insufficient documentation

## 2020-06-12 DIAGNOSIS — Z981 Arthrodesis status: Secondary | ICD-10-CM | POA: Insufficient documentation

## 2020-06-12 HISTORY — DX: Anxiety disorder, unspecified: F41.9

## 2020-06-12 HISTORY — DX: Encounter for general adult medical examination without abnormal findings: Z00.00

## 2020-06-12 NOTE — Telephone Encounter (Signed)
Does pt want me to order echo for heart murmur? To assess and f/u ?

## 2020-06-16 NOTE — Telephone Encounter (Signed)
Patient declines imaging due to potential costs

## 2020-06-18 ENCOUNTER — Telehealth: Payer: Self-pay | Admitting: Oncology

## 2020-06-18 ENCOUNTER — Inpatient Hospital Stay: Payer: 59

## 2020-06-18 NOTE — Telephone Encounter (Signed)
Done  Per pt request to cx 06/18/20 Venofer appt due to not feeling well. Pt stated that she would contact the office back at a later date to have her 06/18/20 Infusion appt R.S.Marland KitchenMarland Kitchen

## 2020-06-18 NOTE — Telephone Encounter (Signed)
Pt left VM to reschedule appointment for 1/19. pt not feeling well. Pt requests return call at [phone]

## 2020-06-25 ENCOUNTER — Encounter: Payer: Self-pay | Admitting: Internal Medicine

## 2020-06-26 ENCOUNTER — Encounter: Payer: Self-pay | Admitting: Oncology

## 2020-06-26 NOTE — Telephone Encounter (Signed)
Done  Pt will RTC on 07/04/20 Pt is aware

## 2020-06-30 ENCOUNTER — Other Ambulatory Visit: Payer: Self-pay

## 2020-07-01 ENCOUNTER — Telehealth (INDEPENDENT_AMBULATORY_CARE_PROVIDER_SITE_OTHER): Payer: 59 | Admitting: Gastroenterology

## 2020-07-01 ENCOUNTER — Telehealth: Payer: Self-pay

## 2020-07-01 ENCOUNTER — Encounter: Payer: Self-pay | Admitting: Gastroenterology

## 2020-07-01 DIAGNOSIS — D508 Other iron deficiency anemias: Secondary | ICD-10-CM | POA: Diagnosis not present

## 2020-07-01 NOTE — Telephone Encounter (Signed)
Called patient to schedule her an EGD and she stated that she currently had some things to do before scheduling her EGD. Patient stated that she would call us when she was ready to schedule it.

## 2020-07-02 ENCOUNTER — Other Ambulatory Visit: Payer: Self-pay

## 2020-07-02 ENCOUNTER — Ambulatory Visit (INDEPENDENT_AMBULATORY_CARE_PROVIDER_SITE_OTHER): Payer: 59 | Admitting: Obstetrics and Gynecology

## 2020-07-02 ENCOUNTER — Encounter: Payer: Self-pay | Admitting: Obstetrics and Gynecology

## 2020-07-02 VITALS — BP 132/86 | Ht 62.0 in | Wt 148.0 lb

## 2020-07-02 DIAGNOSIS — M549 Dorsalgia, unspecified: Secondary | ICD-10-CM

## 2020-07-02 DIAGNOSIS — Z780 Asymptomatic menopausal state: Secondary | ICD-10-CM | POA: Diagnosis not present

## 2020-07-02 DIAGNOSIS — Z13820 Encounter for screening for osteoporosis: Secondary | ICD-10-CM | POA: Diagnosis not present

## 2020-07-02 DIAGNOSIS — Z1239 Encounter for other screening for malignant neoplasm of breast: Secondary | ICD-10-CM

## 2020-07-02 DIAGNOSIS — Z01419 Encounter for gynecological examination (general) (routine) without abnormal findings: Secondary | ICD-10-CM

## 2020-07-02 DIAGNOSIS — G629 Polyneuropathy, unspecified: Secondary | ICD-10-CM

## 2020-07-02 DIAGNOSIS — G8929 Other chronic pain: Secondary | ICD-10-CM

## 2020-07-02 MED ORDER — PREMARIN 0.625 MG/GM VA CREA: 1.0000 | TOPICAL_CREAM | VAGINAL | 3 refills | Status: DC

## 2020-07-02 NOTE — Progress Notes (Signed)
Shelby Antigua, MD [address]  [place]  [place], [place] [address]  Main: [phone]  Fax: [phone]   Primary Care Physician: McLean-Scocuzza, Nino Glow, MD  Virtual Visit via Telephone Note  I connected with patient on 07/02/20 at  1:15 PM EST by telephone and verified that I am speaking with the correct person using two identifiers.   I discussed the limitations, risks, security and privacy concerns of performing an evaluation and management service by telephone and the availability of in person appointments. I also discussed with the patient that there may be a patient responsible charge related to this service. The patient expressed understanding and agreed to proceed.  Location of Patient: Home Location of Provider: Home Persons involved: Patient and provider only during the visit (nursing staff and front desk staff was involved in communicating with the patient prior to the appointment, reviewing medications and checking them in)   History of Present Illness: Chief Complaint  Patient presents with  . IDA     HPI: Shelby Colon is a 55 y.o. female here for iron deficiency anemia.  Patient has previous history of Roux-en-Y gastric bypass and has already had a previous colonoscopy.  Please see results for details.  Ferritin level was low normal at 12 February 2019, around the same time her colonoscopy was done which was October 2020.  Patient does not have any active GI bleeding symptoms.  No nausea vomiting, abdominal pain, heartburn, dysphagia weight loss  Current Outpatient Medications  Medication Sig Dispense Refill  . Biotin w/ Vitamins C & E (HAIR/SKIN/NAILS PO) Take 2 tablets by mouth daily.    . cyclobenzaprine (FLEXERIL) 10 MG tablet Take 10 mg by mouth 3 (three) times daily.    . diclofenac Sodium (VOLTAREN) 1 % GEL Apply 1 application topically 4 (four) times daily as needed (knee pain/lower back pain.).    Marland Kitchen DULoxetine (CYMBALTA) 20 MG capsule  Take 1 capsule (20 mg total) by mouth daily. 90 capsule 3  . Fluocinolone Acetonide 0.01 % OIL Place 5 application in ear(s) 2 (two) times daily as needed. X 2 weeks left ear 20 mL 0  . gabapentin (NEURONTIN) 600 MG tablet 1 pill in am 1 pill with lunch and 2 at night (Patient taking differently: Take 600-1,200 mg by mouth See admin instructions. 1 pill in am 1 pill with lunch and 2 at night) 120 tablet 11  . hydroxychloroquine (PLAQUENIL) 200 MG tablet Take 200 mg by mouth 2 (two) times daily.    . methocarbamol (ROBAXIN) 750 MG tablet Take 1 tablet by mouth every 6 (six) hours as needed.    . NON FORMULARY Liquid vitamin Miracle 2000    . OVER THE COUNTER MEDICATION Take 1 Dose by mouth daily. Kratom    . [START ON 07/03/2020] conjugated estrogens (PREMARIN) vaginal cream Place 1 Applicatorful vaginally 2 (two) times a week. 30 g 3   No current facility-administered medications for this visit.    Allergies as of 07/01/2020 - Review Complete 07/01/2020  Allergen Reaction Noted  . Morphine Nausea And Vomiting 05/14/2014    Review of Systems:    All systems reviewed and negative except where noted in HPI.   Observations/Objective:  Labs: CMP     Component Value Date/Time   NA 141 11/21/2019 0907   NA 139 02/15/2019 1004   K 3.7 11/21/2019 0907   CL 102 11/21/2019 0907   CO2 29 11/21/2019 0907   GLUCOSE 94 11/21/2019 0907  BUN 14 11/21/2019 0907   BUN 8 02/15/2019 1004   CREATININE 0.88 11/21/2019 0907   CREATININE 0.74 12/05/2017 1044   CALCIUM 8.9 11/21/2019 0907   PROT 6.7 02/15/2019 1004   ALBUMIN 4.1 02/15/2019 1004   AST 17 02/15/2019 1004   ALT 7 02/15/2019 1004   ALKPHOS 127 (H) 02/15/2019 1004   BILITOT 0.3 02/15/2019 1004   GFRNONAA >60 11/21/2019 0907   GFRAA >60 11/21/2019 0907   Lab Results  Component Value Date   WBC 6.8 06/10/2020   HGB 10.5 (L) 06/10/2020   HCT 33.3 (L) 06/10/2020   MCV 87.9 06/10/2020   PLT 421 (H) 06/10/2020    Imaging  Studies: No results found.  Assessment and Plan:   Shelby Colon is a 55 y.o. y/o female here for iron deficiency anemia  Assessment and Plan: Iron deficiency me in likely due to her underlying Roux-en-Y gastric bypass anatomy  However, iron deficiency has been continuing despite displacement Therefore, EGD indicated at this time to rule out any lesions such as anastomotic ulcers or others  No need for repeat colonoscopy since colonoscopy was done in October 2020 when her ferritin was already low normal  I have discussed alternative options, risks & benefits,  which include, but are not limited to, bleeding, infection, perforation,respiratory complication & drug reaction.  The patient agrees with this plan & written consent will be obtained.    Continue follow-up with Dr. Tasia Catchings  Follow Up Instructions:    I discussed the assessment and treatment plan with the patient. The patient was provided an opportunity to ask questions and all were answered. The patient agreed with the plan and demonstrated an understanding of the instructions.   The patient was advised to call back or seek an in-person evaluation if the symptoms worsen or if the condition fails to improve as anticipated.  I provided 12 minutes of non-face-to-face time during this encounter. Additional time was spent in reviewing patient's chart, placing orders etc.   Virgel Manifold, MD  Speech recognition software was used to dictate this note.

## 2020-07-02 NOTE — Patient Instructions (Signed)
[facility] [address] [place] [address]  [facility]  [address] [place] [address]  Phone: [phone]  

## 2020-07-02 NOTE — Progress Notes (Signed)
Gynecology Annual Exam  PCP: McLean-Scocuzza, Nino Glow, MD  Chief Complaint:  Chief Complaint  Patient presents with  . Gynecologic Exam    Annual - Rt breast sharp pain when moving arm. RM 5    History of Present Illness:Patient is a 55 y.o. No obstetric history on file. presents for annual exam. The patient has no complaints today.   LMP: No LMP recorded. Patient is postmenopausal. No PMB  The patient is sexually active. She denies dyspareunia.  The patient does perform self breast exams.  There is no notable family history of breast or ovarian cancer in her family.  The patient wears seatbelts: yes.   The patient has regular exercise: not asked.    The patient denies current symptoms of depression.     Review of Systems: Review of Systems  Constitutional: Negative for chills and fever.  HENT: Negative for congestion.   Respiratory: Negative for cough and shortness of breath.   Cardiovascular: Negative for chest pain and palpitations.  Gastrointestinal: Negative for abdominal pain, constipation, diarrhea, heartburn, nausea and vomiting.  Genitourinary: Negative for dysuria, frequency and urgency.  Skin: Negative for itching and rash.  Neurological: Negative for dizziness and headaches.  Endo/Heme/Allergies: Negative for polydipsia.  Psychiatric/Behavioral: Negative for depression.    Past Medical History:   Patient Active Problem List   Diagnosis Date Noted  . Annual physical exam 06/12/2020  . History of fusion of cervical spine 06/12/2020  . Anxiety 06/12/2020  . Depression, recurrent ([place]) 06/12/2020  . PTSD (post-traumatic stress disorder) 06/12/2020  . Cervical radiculopathy 11/28/2019  . Abnormal MRI, cervical spine 10/17/2019    FINDINGS: Alignment: Reversal of the normal cervical lordosis with apex at C3-4. Trace retrolisthesis of C4 on C5, C5 on C6, and C6 on C7, with anterolisthesis of C7 on T1. Findings chronic and facet mediated.  Vertebrae:  Vertebral body height maintained without evidence for acute or chronic fracture. Bone marrow signal intensity within normal limits. No discrete or worrisome osseous lesions. Prominent discogenic reactive endplate changes present at C3-4 through C6-7. No other abnormal marrow edema.  Cord: Signal intensity within the cervical spinal cord is within normal limits.  Posterior Fossa, vertebral arteries, paraspinal tissues: Visualized brain and posterior fossa within normal limits. Craniocervical junction normal. Paraspinous and prevertebral soft tissues within normal limits. Normal intravascular flow voids seen within the vertebral arteries bilaterally.  Disc levels:  C2-C3: Unremarkable.  C3-C4: Chronic intervertebral disc space narrowing with diffuse degenerative disc osteophyte, eccentric to the right. Broad posterior component flattens and partially faces the ventral thecal sac and results in moderate to severe spinal stenosis. Secondary cord flattening without cord signal changes. Severe right with moderate left C4 foraminal stenosis.  C4-C5: Chronic intervertebral disc space narrowing with diffuse degenerative disc osteophyte. Flattening and effacement of the ventral thecal sac with resultant moderate to severe spinal stenosis. Cord flattening without cord signal changes. Severe bilateral C5 foraminal stenosis, right worse than left.  C5-C6: Chronic intervertebral disc space narrowing with diffuse degenerative disc osteophyte. Flattening and effacement of the ventral thecal sac with resultant moderate to severe spinal stenosis. Cord flattening without cord signal changes. Severe bilateral C6 foraminal stenosis, worse on the right.  C6-C7: Chronic intervertebral disc space narrowing with diffuse degenerative disc osteophyte, asymmetric to the left. Flattening and partial effacement of ventral thecal sac with resultant moderate spinal stenosis. Secondary cord  flattening without cord signal changes, greater on the left. Moderate to severe bilateral C7 foraminal stenosis, worse on  the left.  C7-T1: Anterolisthesis. Moderate to advanced right worse than left facet hypertrophy. No disc bulge. No canal or foraminal stenosis.  T1-2: Left paracentral to foraminal disc protrusion extends into the left neural foramen (series 5, image 11). Bilateral facet hypertrophy. Resultant moderate to severe left with moderate right foraminal narrowing. No spinal stenosis.  T2-3: Left paracentral to foraminal disc protrusion (series 5, image 9). Additional right paracentral to foraminal disc extrusion (series 5, image 5). Superimposed facet hypertrophy. Moderate bilateral foraminal stenosis. No canal narrowing.  IMPRESSION: 1. Advanced degenerative spondylosis at C3-4 through C6-7 with resultant moderate to severe diffuse spinal stenosis and secondary cord flattening. No cord signal changes to suggest myelopathy. 2. Moderate to severe bilateral foraminal narrowing at C4 through C7 related to disc bulge and uncovertebral disease. 3. Paracentral to foraminal disc protrusions at T1-2 and T2-3 with resultant moderate to severe bilateral foraminal narrowing as above.   Electronically Signed   By: Jeannine Boga M.D.   On: 09/27/2019 04:29   . Pharmacologic therapy 10/17/2019  . Disorder of skeletal system 10/17/2019  . Problems influencing health status 10/17/2019  . Chronic knee pain after total replacement of both knee joints 10/17/2019  . DDD (degenerative disc disease), cervical 10/17/2019  . DDD (degenerative disc disease), thoracic 10/17/2019  . 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  . Thoracogenic scoliosis of thoracolumbar region 10/17/2019  . Cervical radiculitis 09/28/2019  . Loss of balance 09/28/2019  . Cervicalgia (1ry area of Pain) (Bilateral) (R>L) 09/28/2019  . Stenosis of cervical spine 09/28/2019  . Fall 07/18/2019  .  High cyclic citrullinated peptide (CCP) antibody level 07/02/2019  . Personal history of colonic polyps   . Polyp of sigmoid colon   . Atrophy of right kidney 03/05/2019  . Iron deficiency anemia 03/02/2019  . Hip pain 02/28/2019  . Low back pain 02/28/2019  . Elevated alkaline phosphatase level 02/23/2019  . H/O gastric bypass 02/23/2019  . Iron deficiency 02/23/2019  . Thrombocytosis 02/23/2019  . Chronic knee pain (Bilateral) 09/07/2018    09/21/2018 left total knee ortho [facility]    . History of atrial fibrillation 02/14/2018  . Cardiac murmur 02/14/2018  . Elevated liver enzymes 02/14/2018  . Joint pain 02/14/2018  . Neuropathy 02/14/2018  . Positive colorectal cancer screening using Cologuard test   . Benign neoplasm of ascending colon   . Anxiety and depression 11/30/2017  . Insomnia 11/30/2017  . HPV in female 11/30/2017  . Postmenopausal 11/30/2017  . MVP (mitral valve prolapse) 11/30/2017  . Vitamin D deficiency 11/30/2017  . HTN (hypertension) 11/30/2017  . Osteoarthritis of knee 10/04/2017  . History of total knee arthroplasty 09/15/2017  . Papanicolaou smear of cervix with positive high risk human papilloma virus (HPV) test 03/26/2017  . Hypercholesteremia 02/02/2017  . Chronic pain syndrome 01/27/2017  . DDD (degenerative disc disease), lumbosacral 01/27/2017  . Congenital absence of right kidney 01/26/2017  . Absent kidney 10/16/2015  . Anemia 10/16/2015  . Therapeutic drug monitoring 12/11/2014  . Scoliosis of lumbar spine 11/08/2014  . Fusion of spine of lumbosacral region 11/04/2014  . History of Roux-en-Y gastric bypass 11/02/2014    Overview:  Surgery 2013, start weight 258 lbs.      Past Surgical History:  Past Surgical History:  Procedure Laterality Date  . ANTERIOR CERVICAL DECOMPRESSION/DISCECTOMY FUSION 4 LEVELS N/A 11/28/2019   Procedure: ANTERIOR CERVICAL DECOMPRESSION/DISCECTOMY FUSION 4 LEVELS C3-7;   Surgeon: Meade Maw, MD;  Location: [facility] ORS;  Service: Neurosurgery;  Laterality: N/A;  . AUGMENTATION MAMMAPLASTY Bilateral 2009  . BACK SURGERY  11/04/2014   fusion of spine of lumbosacral region 2 rods and 16 screws   . BREAST ENHANCEMENT SURGERY    . COLONOSCOPY WITH PROPOFOL N/A 01/06/2018   Procedure: COLONOSCOPY WITH PROPOFOL;  Surgeon: Virgel Manifold, MD;  Location: [facility] ENDOSCOPY;  Service: Endoscopy;  Laterality: N/A;  . COLONOSCOPY WITH PROPOFOL N/A 03/19/2019   Procedure: COLONOSCOPY WITH PROPOFOL;  Surgeon: Virgel Manifold, MD;  Location: [facility] ENDOSCOPY;  Service: Endoscopy;  Laterality: N/A;  . excess skin removal    . KNEE ARTHROSCOPY Bilateral    x 7 knees b/l (2 surgeries on right knee) total 1 bone graft and 5 arthroscopy total knee   . ROUX-EN-Y PROCEDURE    . TOTAL KNEE ARTHROPLASTY     left 09/21/18 [name] [name]  . TOTAL KNEE ARTHROPLASTY     right knee Dr. Lavonia Drafts     Gynecologic History:  No LMP recorded. Patient is postmenopausal. Last Pap: Results were: 02/12/2019 NIL and HR HPV negative   Obstetric History: No obstetric history on file.  Family History:  Family History  Adopted: Yes  Problem Relation Age of Onset  . Arthritis Mother   . Drug abuse Mother        heriod OD  . Early death Mother   . Diabetes Father   . Hypertension Father   . Depression Father   . Arthritis Father   . Drug abuse Father   . Diabetes Sister   . Fibromyalgia Sister   . Rashes / Skin problems Sister        PSORIASIS  . Diabetes Brother   . Osteoarthritis Maternal Grandmother   . Aneurysm Maternal Aunt   . Thyroid disease Maternal Aunt   . Fibromyalgia Maternal Aunt   . Breast cancer Maternal Aunt   . Cancer Maternal Aunt        breast cancer     Social History:  Social History   Socioeconomic History  . Marital status: Married    Spouse name: Not on file  . Number of children: Not on file  . Years of  education: Not on file  . Highest education level: Not on file  Occupational History  . Not on file  Tobacco Use  . Smoking status: Never Smoker  . Smokeless tobacco: Never Used  Vaping Use  . Vaping Use: Never used  Substance and Sexual Activity  . Alcohol use: Yes    Comment: socially  . Drug use: No  . Sexual activity: Yes  Other Topics Concern  . Not on file  Social History Narrative   Married since 2012    No kids    From [facility] moved to [facility] and now lives here    Currently unemployed used to be [place] unemployed since 04/2018    BA degree    No guns, wears seat belt, safe in relationship    Social Determinants of Radio broadcast assistant Strain: Not on file  Food Insecurity: Not on file  Transportation Needs: Not on file  Physical Activity: Not on file  Stress: Not on file  Social Connections: Not on file  Intimate Partner Violence: Not on file    Allergies:  Allergies  Allergen Reactions  . Morphine Nausea And Vomiting    Medications: Prior to Admission medications   Medication Sig Start Date End Date Taking?  Authorizing Provider  Biotin w/ Vitamins C & E (HAIR/SKIN/NAILS PO) Take 2 tablets by mouth daily.    [provider]  conjugated estrogens (PREMARIN) vaginal cream Place 1 Applicatorful vaginally 2 (two) times a week. 04/09/19   Malachy Mood, MD  cyclobenzaprine (FLEXERIL) 10 MG tablet Take 10 mg by mouth 3 (three) times daily. 03/17/20   [provider]  diclofenac Sodium (VOLTAREN) 1 % GEL Apply 1 application topically 4 (four) times daily as needed (knee pain/lower back pain.).    [provider]  DULoxetine (CYMBALTA) 20 MG capsule Take 1 capsule (20 mg total) by mouth daily. 10/31/19   McLean-Scocuzza, Nino Glow, MD  Fluocinolone Acetonide 0.01 % OIL Place 5 application in ear(s) 2 (two) times daily as needed. X 2 weeks left ear 06/10/20   McLean-Scocuzza, Nino Glow, MD   gabapentin (NEURONTIN) 600 MG tablet 1 pill in am 1 pill with lunch and 2 at night Patient taking differently: Take 600-1,200 mg by mouth See admin instructions. 1 pill in am 1 pill with lunch and 2 at night 09/27/19   McLean-Scocuzza, Nino Glow, MD  hydroxychloroquine (PLAQUENIL) 200 MG tablet Take 200 mg by mouth 2 (two) times daily.    [provider]  methocarbamol (ROBAXIN) 750 MG tablet Take 1 tablet by mouth every 6 (six) hours as needed. 06/12/20   [provider]  NON FORMULARY Liquid vitamin Miracle 2000    [provider]  OVER THE COUNTER MEDICATION Take 1 Dose by mouth daily. Kratom    [provider]    Physical Exam Vitals: Blood pressure 132/86, height 5\' 2"  (1.575 m), weight 148 lb (67.1 kg).  General: NAD HEENT: normocephalic, anicteric Thyroid: no enlargement, no palpable nodules Pulmonary: No increased work of breathing, CTAB Cardiovascular: RRR, distal pulses 2+ Breast: Breast symmetrical, no tenderness, no palpable nodules or masses, no skin or nipple retraction present, no nipple discharge.  Status post prior augmentation mammoplasty. No axillary or supraclavicular lymphadenopathy. Abdomen: NABS, soft, non-tender, non-distended.  Umbilicus without lesions.  No hepatomegaly, splenomegaly or masses palpable. No evidence of hernia  Genitourinary:  External: Normal external female genitalia.  Normal urethral meatus, normal Bartholin's and Skene's glands.    Vagina: Normal vaginal mucosa, no evidence of prolapse.    Cervix: Grossly normal in appearance, no bleeding  Uterus: Non-enlarged, mobile, normal contour.  No CMT  Adnexa: ovaries non-enlarged, no adnexal masses  Rectal: deferred  Lymphatic: no evidence of inguinal lymphadenopathy Extremities: no edema, erythema, or tenderness Neurologic: Grossly intact Psychiatric: mood appropriate, affect full  Female chaperone present for pelvic and breast  portions of the physical  exam  Immunization History  Administered Date(s) Administered  . Influenza,inj,Quad PF,6+ Mos 06/10/2020  . Influenza-Unspecified 04/14/2019  . PFIZER(Purple Top)SARS-COV-2 Vaccination 12/22/2019, 01/12/2020  . Tdap 02/01/2017      Assessment: 55 y.o. No obstetric history on file. routine annual exam  Plan: Problem List Items Addressed This Visit      Nervous and Auditory   Neuropathy    Other Visit Diagnoses    Encounter for gynecological examination without abnormal finding    -  Primary   Breast screening       Relevant Orders   MM DIAG BREAST W/IMPLANT TOMO BILATERAL   Postmenopausal estrogen deficiency       Relevant Orders   DG Bone Density   Osteoporosis screening       Relevant Orders   DG Bone Density   Chronic back pain, unspecified back  location, unspecified back pain laterality       Relevant Orders   DG Bone Density      1) Mammogram - recommend yearly screening mammogram.  Mammogram Was ordered today  bilateral implants, screening mammogram  2) STI screening  was notoffered and therefore not obtained  3) ASCCP guidelines and rational discussed.  Patient opts for every 3 years screening interval  4) Osteoporosis  - per USPTF routine screening DEXA at age 29 - dexa given history of joint issues  5) Routine healthcare maintenance including cholesterol, diabetes screening discussed managed by PCP  6) Colonoscopy - 03/19/2019 with follow up recommended in 5 years  7) Return in about 1 year (around 07/02/2021) for annual.    Malachy Mood, MD Mosetta Pigeon, [place] 07/02/2020, 2:13 PM

## 2020-07-04 ENCOUNTER — Inpatient Hospital Stay: Payer: 59 | Attending: Oncology

## 2020-07-04 VITALS — BP 137/91 | HR 78 | Temp 97.1°F | Resp 18

## 2020-07-04 DIAGNOSIS — D509 Iron deficiency anemia, unspecified: Secondary | ICD-10-CM

## 2020-07-04 DIAGNOSIS — D508 Other iron deficiency anemias: Secondary | ICD-10-CM | POA: Insufficient documentation

## 2020-07-04 DIAGNOSIS — Z9884 Bariatric surgery status: Secondary | ICD-10-CM | POA: Insufficient documentation

## 2020-07-04 MED ORDER — SODIUM CHLORIDE 0.9 % IV SOLN: 200.0000 mg | Freq: Once | INTRAVENOUS | Status: DC

## 2020-07-04 MED ORDER — SODIUM CHLORIDE 0.9 % IV SOLN
Freq: Once | INTRAVENOUS | Status: AC
  Filled 2020-07-04: qty 250

## 2020-07-04 MED ORDER — IRON SUCROSE 20 MG/ML IV SOLN
200.0000 mg | Freq: Once | INTRAVENOUS | Status: AC
  Administered 2020-07-04: 200 mg via INTRAVENOUS
  Filled 2020-07-04: qty 10

## 2020-07-04 NOTE — Progress Notes (Signed)
Patient received prescribed treatment in clinic. IV Venofer. Tolerated well. Patient stable at discharge. 

## 2020-07-04 NOTE — Addendum Note (Signed)
Addended by: Adelina Mings on: 07/04/2020 02:26 PM   Modules accepted: Orders

## 2020-07-29 ENCOUNTER — Other Ambulatory Visit: Payer: Self-pay | Admitting: Internal Medicine

## 2020-07-29 DIAGNOSIS — Z1231 Encounter for screening mammogram for malignant neoplasm of breast: Secondary | ICD-10-CM

## 2020-09-30 ENCOUNTER — Encounter: Payer: Self-pay | Admitting: Gastroenterology

## 2020-09-30 ENCOUNTER — Telehealth: Payer: 59 | Admitting: Gastroenterology

## 2020-10-18 ENCOUNTER — Other Ambulatory Visit: Payer: Self-pay

## 2020-10-18 ENCOUNTER — Emergency Department
Admission: EM | Admit: 2020-10-18 | Discharge: 2020-10-18 | Disposition: A | Discharge status: Home / Self Care | Payer: 59 | Attending: Emergency Medicine | Admitting: Emergency Medicine

## 2020-10-18 ENCOUNTER — Emergency Department: Payer: 59

## 2020-10-18 DIAGNOSIS — I10 Essential (primary) hypertension: Secondary | ICD-10-CM | POA: Diagnosis not present

## 2020-10-18 DIAGNOSIS — M25562 Pain in left knee: Secondary | ICD-10-CM | POA: Insufficient documentation

## 2020-10-18 DIAGNOSIS — Z96653 Presence of artificial knee joint, bilateral: Secondary | ICD-10-CM | POA: Insufficient documentation

## 2020-10-18 DIAGNOSIS — S8262XA Displaced fracture of lateral malleolus of left fibula, initial encounter for closed fracture: Secondary | ICD-10-CM | POA: Diagnosis not present

## 2020-10-18 DIAGNOSIS — W010XXA Fall on same level from slipping, tripping and stumbling without subsequent striking against object, initial encounter: Secondary | ICD-10-CM | POA: Insufficient documentation

## 2020-10-18 DIAGNOSIS — M79672 Pain in left foot: Secondary | ICD-10-CM | POA: Diagnosis not present

## 2020-10-18 DIAGNOSIS — Z79899 Other long term (current) drug therapy: Secondary | ICD-10-CM | POA: Insufficient documentation

## 2020-10-18 DIAGNOSIS — S82892A Other fracture of left lower leg, initial encounter for closed fracture: Secondary | ICD-10-CM

## 2020-10-18 DIAGNOSIS — S99912A Unspecified injury of left ankle, initial encounter: Secondary | ICD-10-CM | POA: Diagnosis present

## 2020-10-18 MED ORDER — ACETAMINOPHEN 325 MG PO TABS
650.0000 mg | ORAL_TABLET | Freq: Once | ORAL | Status: AC
  Administered 2020-10-18: 650 mg via ORAL
  Filled 2020-10-18: qty 2

## 2020-10-18 MED ORDER — OXYCODONE-ACETAMINOPHEN 5-325 MG PO TABS: 1.0000 | ORAL_TABLET | Freq: Four times a day (QID) | ORAL | 0 refills | Status: AC | PRN

## 2020-10-18 MED ORDER — OXYCODONE-ACETAMINOPHEN 5-325 MG PO TABS
1.0000 | ORAL_TABLET | Freq: Once | ORAL | Status: AC
  Administered 2020-10-18: 1 via ORAL
  Filled 2020-10-18: qty 1

## 2020-10-18 NOTE — ED Provider Notes (Signed)
[facility] Emergency Department Provider Note  ____________________________________________   Event Date/Time   First MD Initiated Contact with Patient 10/18/20 1313     (approximate)  I have reviewed the triage vital signs and the nursing notes.   HISTORY  Chief Complaint Foot Pain   HPI Shelby Colon is a 55 y.o. female who reports to the emergency department for evaluation of left foot and ankle pain.  Patient reports that yesterday she was standing up from a couch when she stepped on a dog bone and slipped, causing a significant inversion of the left ankle and fall.  She does report she initially had pain in that anterior aspect of the left knee, but that this resolved.  She was having a significant amount of pain, unable to weight-bear extremity and reported to the [facility] urgent care yesterday.  After x-rays, she was diagnosed with a sprain and given hydrocodone with a walking boot, but states that she still cannot weight-bear in the boot, and the hydrocodone has not been effective for her pain.  She reports increased swelling and bruising today, causing her to present to our facility for evaluation.  She denies any known history of injury to the left ankle, but has had multiple other prior surgeries including bilateral total knees, thoracic and lumbar spinal fusion, cervical fusion.  She has been unable to take anti-inflammatories due to history of Roux-en-Y.       Past Medical History:  Diagnosis Date  . Abnormal Pap smear of cervix    h/o LSIL pap with HPV +; 01/2019 pap negative negative HPV  . Anxiety   . Atrial fibrillation ([place])   . B12 deficiency   . Chronic pain syndrome   . Colon polyps   . Congenital absence of right kidney   . Degeneration of lumbar intervertebral disc   . Depression   . Early menopause   . Gastric bypass status for obesity    2013, weight 258lb  . Headache   . Heart murmur    MVP  . History of chicken pox   .  Hypercholesterolemia   . Hypertension   . Iron deficiency   . Iron deficiency anemia 03/02/2019  . Neuropathy   . Rheumatoid arthritis ([place])    as child   . Scoliosis of lumbar spine   . Solitary kidney, congenital    pt thinks has left kidney   . Thyroid disease    in the past   . UTI (urinary tract infection)   . Vitamin D deficiency     Patient Active Problem List   Diagnosis Date Noted  . Annual physical exam 06/12/2020  . History of fusion of cervical spine 06/12/2020  . Anxiety 06/12/2020  . Depression, recurrent ([place]) 06/12/2020  . PTSD (post-traumatic stress disorder) 06/12/2020  . Cervical radiculopathy 11/28/2019  . Abnormal MRI, cervical spine 10/17/2019  . Pharmacologic therapy 10/17/2019  . Disorder of skeletal system 10/17/2019  . Problems influencing health status 10/17/2019  . Chronic knee pain after total replacement of both knee joints 10/17/2019  . DDD (degenerative disc disease), cervical 10/17/2019  . DDD (degenerative disc disease), thoracic 10/17/2019  . 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  . Thoracogenic scoliosis of thoracolumbar region 10/17/2019  . Cervical radiculitis 09/28/2019  . Loss of balance 09/28/2019  . Cervicalgia (1ry area of Pain) (Bilateral) (R>L) 09/28/2019  . Stenosis of cervical spine 09/28/2019  . Fall 07/18/2019  . High cyclic citrullinated peptide (CCP) antibody level 07/02/2019  .  Personal history of colonic polyps   . Polyp of sigmoid colon   . Atrophy of right kidney 03/05/2019  . Iron deficiency anemia 03/02/2019  . Hip pain 02/28/2019  . Low back pain 02/28/2019  . Elevated alkaline phosphatase level 02/23/2019  . H/O gastric bypass 02/23/2019  . Iron deficiency 02/23/2019  . Thrombocytosis 02/23/2019  . Chronic knee pain (Bilateral) 09/07/2018  . History of atrial fibrillation 02/14/2018  . Cardiac murmur 02/14/2018  . Elevated liver enzymes 02/14/2018  . Joint pain 02/14/2018  . Neuropathy 02/14/2018  .  Positive colorectal cancer screening using Cologuard test   . Benign neoplasm of ascending colon   . Anxiety and depression 11/30/2017  . Insomnia 11/30/2017  . HPV in female 11/30/2017  . Postmenopausal 11/30/2017  . MVP (mitral valve prolapse) 11/30/2017  . Vitamin D deficiency 11/30/2017  . HTN (hypertension) 11/30/2017  . Osteoarthritis of knee 10/04/2017  . History of total knee arthroplasty 09/15/2017  . Papanicolaou smear of cervix with positive high risk human papilloma virus (HPV) test 03/26/2017  . Hypercholesteremia 02/02/2017  . Chronic pain syndrome 01/27/2017  . DDD (degenerative disc disease), lumbosacral 01/27/2017  . Congenital absence of right kidney 01/26/2017  . Absent kidney 10/16/2015  . Anemia 10/16/2015  . Therapeutic drug monitoring 12/11/2014  . Scoliosis of lumbar spine 11/08/2014  . Fusion of spine of lumbosacral region 11/04/2014  . History of Roux-en-Y gastric bypass 11/02/2014    Past Surgical History:  Procedure Laterality Date  . ANTERIOR CERVICAL DECOMPRESSION/DISCECTOMY FUSION 4 LEVELS N/A 11/28/2019   Procedure: ANTERIOR CERVICAL DECOMPRESSION/DISCECTOMY FUSION 4 LEVELS C3-7;  Surgeon: Meade Maw, MD;  Location: [facility] ORS;  Service: Neurosurgery;  Laterality: N/A;  . AUGMENTATION MAMMAPLASTY Bilateral 2009  . BACK SURGERY  11/04/2014   fusion of spine of lumbosacral region 2 rods and 16 screws   . BREAST ENHANCEMENT SURGERY    . COLONOSCOPY WITH PROPOFOL N/A 01/06/2018   Procedure: COLONOSCOPY WITH PROPOFOL;  Surgeon: Virgel Manifold, MD;  Location: [facility] ENDOSCOPY;  Service: Endoscopy;  Laterality: N/A;  . COLONOSCOPY WITH PROPOFOL N/A 03/19/2019   Procedure: COLONOSCOPY WITH PROPOFOL;  Surgeon: Virgel Manifold, MD;  Location: [facility] ENDOSCOPY;  Service: Endoscopy;  Laterality: N/A;  . excess skin removal    . KNEE ARTHROSCOPY Bilateral    x 7 knees b/l (2 surgeries on right knee) total 1 bone graft and 5 arthroscopy total knee    . ROUX-EN-Y PROCEDURE    . TOTAL KNEE ARTHROPLASTY     left 09/21/18 [name] [name]  . TOTAL KNEE ARTHROPLASTY     right knee Dr. Lavonia Drafts     Prior to Admission medications   Medication Sig Start Date End Date Taking? Authorizing Provider  oxyCODONE-acetaminophen (PERCOCET) 5-325 MG tablet Take 1 tablet by mouth every 6 (six) hours as needed for up to 5 days for severe pain. 10/18/20 10/23/20 Yes Laron Boorman, Farrel Gordon, PA  Biotin w/ Vitamins C & E (HAIR/SKIN/NAILS PO) Take 2 tablets by mouth daily.    [provider]  conjugated estrogens (PREMARIN) vaginal cream Place 1 Applicatorful vaginally 2 (two) times a week. 07/03/20   Malachy Mood, MD  cyclobenzaprine (FLEXERIL) 10 MG tablet Take 10 mg by mouth 3 (three) times daily. 03/17/20   [provider]  diclofenac Sodium (VOLTAREN) 1 % GEL Apply 1 application topically 4 (four) times daily as needed (knee pain/lower back pain.).    [provider]  DULoxetine (CYMBALTA) 20 MG capsule Take  1 capsule (20 mg total) by mouth daily. 10/31/19   McLean-Scocuzza, Nino Glow, MD  Fluocinolone Acetonide 0.01 % OIL Place 5 application in ear(s) 2 (two) times daily as needed. X 2 weeks left ear 06/10/20   McLean-Scocuzza, Nino Glow, MD  gabapentin (NEURONTIN) 600 MG tablet 1 pill in am 1 pill with lunch and 2 at night Patient taking differently: Take 600-1,200 mg by mouth See admin instructions. 1 pill in am 1 pill with lunch and 2 at night 09/27/19   McLean-Scocuzza, Nino Glow, MD  hydroxychloroquine (PLAQUENIL) 200 MG tablet Take 200 mg by mouth 2 (two) times daily.    [provider]  methocarbamol (ROBAXIN) 750 MG tablet Take 1 tablet by mouth every 6 (six) hours as needed. 06/12/20   [provider]  NON FORMULARY Liquid vitamin Miracle 2000    [provider]  OVER THE COUNTER MEDICATION Take 1 Dose by mouth daily. Kratom    [provider]     Allergies Morphine  Family History  Adopted: Yes  Problem Relation Age of Onset  . Arthritis Mother   . Drug abuse Mother        heriod OD  . Early death Mother   . Diabetes Father   . Hypertension Father   . Depression Father   . Arthritis Father   . Drug abuse Father   . Diabetes Sister   . Fibromyalgia Sister   . Rashes / Skin problems Sister        PSORIASIS  . Diabetes Brother   . Osteoarthritis Maternal Grandmother   . Aneurysm Maternal Aunt   . Thyroid disease Maternal Aunt   . Fibromyalgia Maternal Aunt   . Breast cancer Maternal Aunt   . Cancer Maternal Aunt        breast cancer     Social History Social History   Tobacco Use  . Smoking status: Never Smoker  . Smokeless tobacco: Never Used  Vaping Use  . Vaping Use: Never used  Substance Use Topics  . Alcohol use: Yes    Comment: socially  . Drug use: No    Review of Systems Constitutional: No fever/chills Eyes: No visual changes. ENT: No sore throat. Cardiovascular: Denies chest pain. Respiratory: Denies shortness of breath. Gastrointestinal: No abdominal pain.  No nausea, no vomiting.  No diarrhea.  No constipation. Genitourinary: Negative for dysuria. Musculoskeletal: + Left ankle pain, negative for back pain. Skin: Negative for rash. Neurological: Negative for headaches, focal weakness or numbness.   ____________________________________________   PHYSICAL EXAM:  VITAL SIGNS: ED Triage Vitals  Enc Vitals Group     BP 10/18/20 1249 (!) 151/99     Pulse Rate 10/18/20 1247 95     Resp 10/18/20 1247 20     Temp 10/18/20 1247 98.2 F (36.8 C)     Temp Source 10/18/20 1247 Oral     SpO2 10/18/20 1247 100 %     Weight 10/18/20 1248 120 lb (54.4 kg)     Height 10/18/20 1248 5\' 2"  (1.575 m)     Head Circumference --      Peak Flow --      Pain Score 10/18/20 1248 9     Pain Loc --      Pain Edu? --      Excl. in [place]? --    Constitutional: Alert and oriented. Well appearing and in  no acute distress. Eyes: Conjunctivae are normal. PERRL. EOMI. Head: Atraumatic. Nose: No congestion/rhinnorhea. Mouth/Throat:  Mucous membranes are moist.  Neck: No stridor.   Cardiovascular: Normal rate, regular rhythm. Grossly normal heart sounds.  Good peripheral circulation. Respiratory: Normal respiratory effort.  No retractions. Lungs CTAB. Musculoskeletal: There is tenderness globally of the ankle, worse on the lateral structures and distal fibula.  There is also point tenderness to the lateral proximal fibula.  No tenderness to the remainder of the knee.  No obvious joint effusion of the knee.  There is obvious soft tissue swelling globally of the ankle, worse on the lateral aspect.  Ecchymosis extends along the distal lateral structures wrapping posteriorly.  Achilles tendon is palpated and appears intact.  Patient unable to perform any significant range of motion secondary to pain.  Dorsal pedal pulse 2+, posterior tibial pulse 2+, capillary refill less than 3 seconds all digits. Neurologic:  Normal speech and language. No gross focal neurologic deficits are appreciated.  Skin:  Skin is warm, dry and intact.  Ecchymosis and soft tissue swelling of the left ankle as described above. Psychiatric: Mood and affect are normal. Speech and behavior are normal.   ____________________________________________  RADIOLOGY I, Marlana Salvage, personally viewed and evaluated these images (plain radiographs) as part of my medical decision making, as well as reviewing the written report by the radiologist.  ED provider interpretation: Concern on left ankle film for possible distal fibular fracture, no other acute abnormalities noted of the left knee, left ankle or foot  Official radiology report(s): DG Ankle Complete Left  Result Date: 10/18/2020 CLINICAL DATA:  No acute bony abnormality. EXAM: LEFT ANKLE COMPLETE - 3+ VIEW COMPARISON:  None. FINDINGS: There is cortical irregularity in the distal  fibular metaphysis. No well-defined fracture line noted, but this could reflect acute or old healed fracture. Overlying soft tissue swelling suggest the possibility of acute fracture. No subluxation or dislocation. IMPRESSION: Cortical irregularity in the distal fibular metaphysis without well-defined fracture line. This could reflect acute or chronic fracture. Overlying soft tissue swelling. Electronically Signed   By: Rolm Baptise M.D.   On: 10/18/2020 14:02   DG Knee Complete 4 Views Left  Result Date: 10/18/2020 CLINICAL DATA:  Fall yesterday. Pain and swelling has not improved. Patient was evaluated and orthopedic office yesterday. EXAM: LEFT KNEE - COMPLETE 4+ VIEW COMPARISON:  None. FINDINGS: Restrained left total knee arthroplasty noted. The knee is located. Prosthetic is within normal limits. No significant effusion is present. No acute osseous abnormality is. IMPRESSION: 1. Status post left total knee arthroplasty. 2. No acute or focal osseous abnormality. Electronically Signed   By: San Morelle M.D.   On: 10/18/2020 13:58   DG Foot Complete Left  Result Date: 10/18/2020 CLINICAL DATA:  Left foot pain, ankle pain EXAM: LEFT FOOT - COMPLETE 3+ VIEW COMPARISON:  Ankle series today FINDINGS: Soft tissue swelling noted laterally and anteriorly at the ankle. No visible fracture within the foot. No subluxation or dislocation. Joint spaces maintained. IMPRESSION: No fracture within the foot. Electronically Signed   By: Rolm Baptise M.D.   On: 10/18/2020 14:03    ____________________________________________   INITIAL IMPRESSION / ASSESSMENT AND PLAN / ED COURSE  As part of my medical decision making, I reviewed the following data within the [place] notes reviewed and incorporated, Radiograph reviewed, Notes from prior ED visits and [place] Controlled Substance Database     Patient is a 55 year old female who presents to the emergency department 1 day after left  ankle injury.  See HPI for further details.  In triage, the patient is mildly hypertensive otherwise has normal vital signs.  On physical exam, she does have a significant amount of soft tissue swelling, ecchymosis and tenderness to the left ankle as well as tenderness to the proximal fibula.  She has decreased range of motion but is neurovascularly intact.  Differentials considered include ankle fracture, Maisonneuve fracture, foot fracture, severe lateral ankle sprain, peroneal tendon injury, other musculoskeletal injury.  X-ray was obtained of the left knee, left ankle and left foot.  There is radiologist reported concern for distal fibula fracture.  At this time, we will continue to keep the patient in the boot that she was previously given at [facility] urgent care.  Encouraged the use of crutches to remain nonweightbearing if weightbearing significantly increases her pain.  Her pain has been better controlled here with the use of Percocet instead of Norco, and thus will prescribe this for a very short course outpatient until orthopedic follow-up.  Also encouraged use of ice topically, and the avoidance of NSAIDs given her history of Roux-en-Y.  Patient is amenable with this plan, she stable at this time for outpatient follow-up with [facility].        ____________________________________________   FINAL CLINICAL IMPRESSION(S) / ED DIAGNOSES  Final diagnoses:  Closed fracture of left ankle, initial encounter     ED Discharge Orders         Ordered    oxyCODONE-acetaminophen (PERCOCET) 5-325 MG tablet  Every 6 hours PRN        10/18/20 1446          *Please note:  Shelby Colon was evaluated in Emergency Department on 10/18/2020 for the symptoms described in the history of present illness. She was evaluated in the context of the global COVID-19 pandemic, which necessitated consideration that the patient might be at risk for infection with the SARS-CoV-2 virus that causes COVID-19.  Institutional protocols and algorithms that pertain to the evaluation of patients at risk for COVID-19 are in a state of rapid change based on information released by regulatory bodies including the CDC and federal and state organizations. These policies and algorithms were followed during the patient's care in the ED.  Some ED evaluations and interventions may be delayed as a result of limited staffing during and the pandemic.*   Note:  This document was prepared using Dragon voice recognition software and may include unintentional dictation errors.   Marlana Salvage, PA 10/19/20 1246    Carrie Mew, MD 10/19/20 [phone]

## 2020-10-18 NOTE — Discharge Instructions (Addendum)
You have been prescribed Percocet to use for severe pain only.  You may also take an additional 650 mg of Tylenol every 6 hours with this medication.  Please ice the ankle frequently for 15 minutes at a time.  Remain nonweightbearing until you are able to tolerate weightbearing in the boot.  Please follow-up with orthopedics in 1 week for repeat evaluation.

## 2020-10-18 NOTE — ED Triage Notes (Signed)
Pt via POV from home. Pt was seen at [facility] yesterday after a fall. Pt states that her X-Ray was negative. Pt states the swelling and pain is not any better. Pt was prescribed hydrocodone with no relief. Pt is A&Ox4 and NAD

## 2020-12-09 ENCOUNTER — Telehealth: Payer: Self-pay | Admitting: Oncology

## 2020-12-09 NOTE — Telephone Encounter (Signed)
12/09/2020 Left VM for pt informing her that appts on 7/13 & 7/15 have been r/s to 7/15 @ 1:15 (labs) and 7/19 @ 2:15 Tasia Catchings) due to changes w/ providers schedule. Left call back number in case there are any questions or scheduling issues  SRW

## 2020-12-10 ENCOUNTER — Inpatient Hospital Stay: Payer: 59

## 2020-12-12 ENCOUNTER — Inpatient Hospital Stay: Payer: 59 | Admitting: Oncology

## 2020-12-12 ENCOUNTER — Inpatient Hospital Stay: Payer: 59

## 2020-12-16 ENCOUNTER — Inpatient Hospital Stay: Payer: 59 | Admitting: Oncology

## 2020-12-16 ENCOUNTER — Inpatient Hospital Stay: Payer: 59

## 2021-01-02 ENCOUNTER — Other Ambulatory Visit: Payer: Self-pay

## 2021-01-02 ENCOUNTER — Other Ambulatory Visit: Payer: Self-pay | Admitting: Obstetrics and Gynecology

## 2021-01-02 DIAGNOSIS — D509 Iron deficiency anemia, unspecified: Secondary | ICD-10-CM

## 2021-01-02 DIAGNOSIS — Z1231 Encounter for screening mammogram for malignant neoplasm of breast: Secondary | ICD-10-CM

## 2021-01-05 ENCOUNTER — Inpatient Hospital Stay: Payer: 59 | Attending: Oncology

## 2021-01-05 ENCOUNTER — Other Ambulatory Visit: Payer: Self-pay

## 2021-01-05 DIAGNOSIS — E538 Deficiency of other specified B group vitamins: Secondary | ICD-10-CM | POA: Insufficient documentation

## 2021-01-05 DIAGNOSIS — D509 Iron deficiency anemia, unspecified: Secondary | ICD-10-CM | POA: Diagnosis not present

## 2021-01-05 DIAGNOSIS — F419 Anxiety disorder, unspecified: Secondary | ICD-10-CM | POA: Insufficient documentation

## 2021-01-05 DIAGNOSIS — D508 Other iron deficiency anemias: Secondary | ICD-10-CM

## 2021-01-05 DIAGNOSIS — F32A Depression, unspecified: Secondary | ICD-10-CM | POA: Diagnosis not present

## 2021-01-05 DIAGNOSIS — Z9884 Bariatric surgery status: Secondary | ICD-10-CM | POA: Insufficient documentation

## 2021-01-05 DIAGNOSIS — R591 Generalized enlarged lymph nodes: Secondary | ICD-10-CM | POA: Insufficient documentation

## 2021-01-05 LAB — CBC WITH DIFFERENTIAL/PLATELET
Abs Immature Granulocytes: 0.01 10*3/uL (ref 0.00–0.07)
Basophils Absolute: 0.1 10*3/uL (ref 0.0–0.1)
Basophils Relative: 1 %
Eosinophils Absolute: 0.2 10*3/uL (ref 0.0–0.5)
Eosinophils Relative: 2 %
HCT: 33.5 % — ABNORMAL LOW (ref 36.0–46.0)
Hemoglobin: 10.5 g/dL — ABNORMAL LOW (ref 12.0–15.0)
Immature Granulocytes: 0 %
Lymphocytes Relative: 17 %
Lymphs Abs: 1.1 10*3/uL (ref 0.7–4.0)
MCH: 27.9 pg (ref 26.0–34.0)
MCHC: 31.3 g/dL (ref 30.0–36.0)
MCV: 89.1 fL (ref 80.0–100.0)
Monocytes Absolute: 0.5 10*3/uL (ref 0.1–1.0)
Monocytes Relative: 7 %
Neutro Abs: 4.9 10*3/uL (ref 1.7–7.7)
Neutrophils Relative %: 73 %
Platelets: 354 10*3/uL (ref 150–400)
RBC: 3.76 MIL/uL — ABNORMAL LOW (ref 3.87–5.11)
RDW: 14.2 % (ref 11.5–15.5)
WBC: 6.8 10*3/uL (ref 4.0–10.5)
nRBC: 0 % (ref 0.0–0.2)

## 2021-01-05 LAB — IRON AND TIBC
Iron: 82 ug/dL (ref 28–170)
Saturation Ratios: 32 % — ABNORMAL HIGH (ref 10.4–31.8)
TIBC: 255 ug/dL (ref 250–450)
UIBC: 173 ug/dL

## 2021-01-05 LAB — COMPREHENSIVE METABOLIC PANEL
ALT: 11 U/L (ref 0–44)
AST: 19 U/L (ref 15–41)
Albumin: 3.8 g/dL (ref 3.5–5.0)
Alkaline Phosphatase: 78 U/L (ref 38–126)
Anion gap: 9 (ref 5–15)
BUN: 8 mg/dL (ref 6–20)
CO2: 25 mmol/L (ref 22–32)
Calcium: 8.5 mg/dL — ABNORMAL LOW (ref 8.9–10.3)
Chloride: 101 mmol/L (ref 98–111)
Creatinine, Ser: 0.81 mg/dL (ref 0.44–1.00)
GFR, Estimated: >60 mL/min (ref >60)
Glucose, Bld: 101 mg/dL — ABNORMAL HIGH (ref 70–99)
Potassium: 3.5 mmol/L (ref 3.5–5.1)
Sodium: 135 mmol/L (ref 135–145)
Total Bilirubin: 0.8 mg/dL (ref 0.3–1.2)
Total Protein: 6.8 g/dL (ref 6.5–8.1)

## 2021-01-05 LAB — VITAMIN B12: Vitamin B-12: 194 pg/mL (ref 180–914)

## 2021-01-05 LAB — FOLATE: Folate: 7.5 ng/mL (ref >5.9)

## 2021-01-05 LAB — FERRITIN: Ferritin: 90 ng/mL (ref 11–307)

## 2021-01-07 ENCOUNTER — Encounter: Payer: Self-pay | Admitting: Oncology

## 2021-01-07 ENCOUNTER — Inpatient Hospital Stay: Payer: 59

## 2021-01-07 ENCOUNTER — Inpatient Hospital Stay (HOSPITAL_BASED_OUTPATIENT_CLINIC_OR_DEPARTMENT_OTHER): Payer: 59 | Admitting: Oncology

## 2021-01-07 ENCOUNTER — Other Ambulatory Visit: Payer: Self-pay

## 2021-01-07 VITALS — BP 145/87 | HR 82 | Temp 98.1°F | Resp 20 | Wt 129.9 lb

## 2021-01-07 DIAGNOSIS — R590 Localized enlarged lymph nodes: Secondary | ICD-10-CM

## 2021-01-07 DIAGNOSIS — D509 Iron deficiency anemia, unspecified: Secondary | ICD-10-CM | POA: Diagnosis not present

## 2021-01-07 DIAGNOSIS — Z9884 Bariatric surgery status: Secondary | ICD-10-CM

## 2021-01-07 DIAGNOSIS — R591 Generalized enlarged lymph nodes: Secondary | ICD-10-CM | POA: Diagnosis not present

## 2021-01-07 NOTE — Progress Notes (Signed)
Hematology/Oncology follow up note [facility] Telephone:([phone] [phone]) [phone]   Patient Care Team: McLean-Scocuzza, Nino Glow, MD as PCP - General (Internal Medicine)  REFERRING PROVIDER: McLean-Scocuzza, Olivia Mackie *  CHIEF COMPLAINTS/REASON FOR VISIT:  Follow up for iron deficiency anemia.   HISTORY OF PRESENTING ILLNESS:  Shelby Colon is a 55 y.o. female who was seen in consultation at the request of McLean-Scocuzza, Olivia Mackie * for evaluation of thromcbocytosis Reviewed patient's labs.  02/15/2019 Labs showed elevated platelet counts at 453, Hb 11.3,  wbc 7.3.   Reviewed patient's previous labs. Thrombocytosis onset is chronic onset , duration since 2019.  No aggravating or elevated factors. Associated symptoms or signs:  Denies weight loss, fever, chills, fatigue, night sweats.  Context:  Smoking history: never smoker Family history of polycythemia. denies History of iron deficiency anemia. Yes.  History of DVT, denies History of gastric bypass. Yes.  Had a colonoscopy 03/19/2019.   INTERVAL HISTORY Shelby Colon is a 55 y.o. female who has above history reviewed by me today presents for follow up visit for management of iron deficiency anemia Problems and complaints are listed below: S/p IV Venofer treatments.  Still feels tired.  Anxiety has improved.  She felt lymph node of right inguinal area.    Review of Systems  Constitutional:  Positive for fatigue. Negative for appetite change, chills and fever.  HENT:   Negative for hearing loss and voice change.   Eyes:  Negative for eye problems.  Respiratory:  Negative for chest tightness and cough.   Cardiovascular:  Negative for chest pain.  Gastrointestinal:  Negative for abdominal distention, abdominal pain and blood in stool.  Endocrine: Negative for hot flashes.  Genitourinary:  Negative for difficulty urinating and frequency.   Musculoskeletal:  Negative for arthralgias.  Skin:   Negative for itching and rash.  Neurological:  Negative for extremity weakness.  Hematological:  Positive for adenopathy.  Psychiatric/Behavioral:  Negative for confusion.    MEDICAL HISTORY:  Past Medical History:  Diagnosis Date   Abnormal Pap smear of cervix    h/o LSIL pap with HPV +; 01/2019 pap negative negative HPV   Anxiety    Atrial fibrillation (HCC)    B12 deficiency    Chronic pain syndrome    Colon polyps    Congenital absence of right kidney    Degeneration of lumbar intervertebral disc    Depression    Early menopause    Gastric bypass status for obesity    2013, weight 258lb   Headache    Heart murmur    MVP   History of chicken pox    Hypercholesterolemia    Hypertension    Iron deficiency    Iron deficiency anemia 03/02/2019   Neuropathy    Rheumatoid arthritis ([place])    as child    Scoliosis of lumbar spine    Solitary kidney, congenital    pt thinks has left kidney    Thyroid disease    in the past    UTI (urinary tract infection)    Vitamin D deficiency     SURGICAL HISTORY: Past Surgical History:  Procedure Laterality Date   ANTERIOR CERVICAL DECOMPRESSION/DISCECTOMY FUSION 4 LEVELS N/A 11/28/2019   Procedure: ANTERIOR CERVICAL DECOMPRESSION/DISCECTOMY FUSION 4 LEVELS C3-7;  Surgeon: Meade Maw, MD;  Location: [facility] ORS;  Service: Neurosurgery;  Laterality: N/A;   AUGMENTATION MAMMAPLASTY Bilateral 2009   BACK SURGERY  11/04/2014   fusion of spine of lumbosacral region 2 rods and 16  screws    BREAST ENHANCEMENT SURGERY     COLONOSCOPY WITH PROPOFOL N/A 01/06/2018   Procedure: COLONOSCOPY WITH PROPOFOL;  Surgeon: Virgel Manifold, MD;  Location: [facility] END[facility];  Service: Endoscopy;  Laterality: N/A;   COLONOSCOPY WITH PROPOFOL N/A 03/19/2019   Procedure: COLONOSCOPY WITH PROPOFOL;  Surgeon: Virgel Manifold, MD;  Location: [facility] ENDOSCOPY;  Service: Endoscopy;  Laterality: N/A;   excess skin removal     KNEE ARTHROSCOPY Bilateral     x 7 knees b/l (2 surgeries on right knee) total 1 bone graft and 5 arthroscopy total knee    ROUX-EN-Y PROCEDURE     TOTAL KNEE ARTHROPLASTY     left 09/21/18 [name] [name]   TOTAL KNEE ARTHROPLASTY     right knee Dr. Lavonia Drafts     SOCIAL HISTORY: Social History   Socioeconomic History   Marital status: Married    Spouse name: Not on file   Number of children: Not on file   Years of education: Not on file   Highest education level: Not on file  Occupational History   Not on file  Tobacco Use   Smoking status: Never   Smokeless tobacco: Never  Vaping Use   Vaping Use: Never used  Substance and Sexual Activity   Alcohol use: Yes    Comment: socially   Drug use: No   Sexual activity: Yes  Other Topics Concern   Not on file  Social History Narrative   Married since 2012    No kids    From [place] moved to [facility] and now lives here    Currently unemployed used to be [place] unemployed since 04/2018    BA degree    No guns, wears seat belt, safe in relationship    Social Determinants of Radio broadcast assistant Strain: Not on file  Food Insecurity: Not on file  Transportation Needs: Not on file  Physical Activity: Not on file  Stress: Not on file  Social Connections: Not on file  Intimate Partner Violence: Not on file    FAMILY HISTORY: Family History  Adopted: Yes  Problem Relation Age of Onset   Arthritis Mother    Drug abuse Mother        heriod OD   Early death Mother    Diabetes Father    Hypertension Father    Depression Father    Arthritis Father    Drug abuse Father    Diabetes Sister    Fibromyalgia Sister    Rashes / Skin problems Sister        PSORIASIS   Diabetes Brother    Osteoarthritis Maternal Grandmother    Aneurysm Maternal Aunt    Thyroid disease Maternal Aunt    Fibromyalgia Maternal Aunt    Breast cancer Maternal Aunt    Cancer Maternal Aunt         breast cancer     ALLERGIES:  is allergic to morphine.  MEDICATIONS:  Current Outpatient Medications  Medication Sig Dispense Refill   conjugated estrogens (PREMARIN) vaginal cream Place 1 Applicatorful vaginally 2 (two) times a week. 30 g 3   diclofenac Sodium (VOLTAREN) 1 % GEL Apply 1 application topically 4 (four) times daily as needed (knee pain/lower back pain.).     DULoxetine (CYMBALTA) 20 MG capsule Take 1 capsule (20 mg total) by mouth daily. 90 capsule 3   hydroxychloroquine (PLAQUENIL) 200 MG tablet Take 200 mg by  mouth 2 (two) times daily.     methocarbamol (ROBAXIN) 750 MG tablet Take 1 tablet by mouth every 6 (six) hours as needed.     NON FORMULARY Liquid vitamin Miracle 2000     OVER THE COUNTER MEDICATION Take 1 Dose by mouth daily. Kratom     pregabalin (LYRICA) 50 MG capsule Take 50 mg by mouth 3 (three) times daily.     Biotin w/ Vitamins C & E (HAIR/SKIN/NAILS PO) Take 2 tablets by mouth daily. (Patient not taking: Reported on 01/07/2021)     No current facility-administered medications for this visit.     PHYSICAL EXAMINATION: ECOG PERFORMANCE STATUS: 1 - Symptomatic but completely ambulatory Vitals:   01/07/21 1449  BP: (!) 145/87  Pulse: 82  Resp: 20  Temp: 98.1 F (36.7 C)  SpO2: 100%   Filed Weights   01/07/21 1449  Weight: 129 lb 14.4 oz (58.9 kg)    Physical Exam Constitutional:      General: She is not in acute distress. HENT:     Head: Normocephalic and atraumatic.  Eyes:     General: No scleral icterus.    Pupils: Pupils are equal, round, and reactive to light.  Cardiovascular:     Rate and Rhythm: Normal rate and regular rhythm.     Heart sounds: Normal heart sounds.  Pulmonary:     Effort: Pulmonary effort is normal.  Abdominal:     General: Bowel sounds are normal. There is no distension.     Palpations: Abdomen is soft. There is no mass.     Tenderness: There is no abdominal tenderness.  Musculoskeletal:        General: No  deformity. Normal range of motion.     Cervical back: Normal range of motion and neck supple.     Comments: 2-3 small right inguinal lymphadenopathy  Skin:    General: Skin is warm and dry.     Findings: No erythema or rash.  Neurological:     Mental Status: She is alert and oriented to person, place, and time.     Cranial Nerves: No cranial nerve deficit.     Coordination: Coordination normal.  Psychiatric:        Mood and Affect: Mood normal.     LABORATORY DATA:  I have reviewed the data as listed Lab Results  Component Value Date   WBC 6.8 01/05/2021   HGB 10.5 (L) 01/05/2021   HCT 33.5 (L) 01/05/2021   MCV 89.1 01/05/2021   PLT 354 01/05/2021   Recent Labs    01/05/21 1107  NA 135  K 3.5  CL 101  CO2 25  GLUCOSE 101*  BUN 8  CREATININE 0.81  CALCIUM 8.5*  GFRNONAA >60  PROT 6.8  ALBUMIN 3.8  AST 19  ALT 11  ALKPHOS 78  BILITOT 0.8    Iron/TIBC/Ferritin/ %Sat    Component Value Date/Time   IRON 82 01/05/2021 1107   IRON 35 02/15/2019 1004   TIBC 255 01/05/2021 1107   TIBC 286 02/15/2019 1004   FERRITIN 90 01/05/2021 1107   FERRITIN 38 02/15/2019 1004   IRONPCTSAT 32 (H) 01/05/2021 1107   IRONPCTSAT 12 (L) 02/15/2019 1004   IRONPCTSAT 31 12/05/2017 1044      RADIOGRAPHIC STUDIES: I have personally reviewed the radiological images as listed and agreed with the findings in the report. DG Ankle Complete Left  Result Date: 10/18/2020 CLINICAL DATA:  No acute bony abnormality. EXAM: LEFT ANKLE COMPLETE - 3+  VIEW COMPARISON:  None. FINDINGS: There is cortical irregularity in the distal fibular metaphysis. No well-defined fracture line noted, but this could reflect acute or old healed fracture. Overlying soft tissue swelling suggest the possibility of acute fracture. No subluxation or dislocation. IMPRESSION: Cortical irregularity in the distal fibular metaphysis without well-defined fracture line. This could reflect acute or chronic fracture. Overlying  soft tissue swelling. Electronically Signed   By: Rolm Baptise M.D.   On: 10/18/2020 14:02   DG Knee Complete 4 Views Left  Result Date: 10/18/2020 CLINICAL DATA:  Fall yesterday. Pain and swelling has not improved. Patient was evaluated and orthopedic office yesterday. EXAM: LEFT KNEE - COMPLETE 4+ VIEW COMPARISON:  None. FINDINGS: Restrained left total knee arthroplasty noted. The knee is located. Prosthetic is within normal limits. No significant effusion is present. No acute osseous abnormality is. IMPRESSION: 1. Status post left total knee arthroplasty. 2. No acute or focal osseous abnormality. Electronically Signed   By: San Morelle M.D.   On: 10/18/2020 13:58   DG Foot Complete Left  Result Date: 10/18/2020 CLINICAL DATA:  Left foot pain, ankle pain EXAM: LEFT FOOT - COMPLETE 3+ VIEW COMPARISON:  Ankle series today FINDINGS: Soft tissue swelling noted laterally and anteriorly at the ankle. No visible fracture within the foot. No subluxation or dislocation. Joint spaces maintained. IMPRESSION: No fracture within the foot. Electronically Signed   By: Rolm Baptise M.D.   On: 10/18/2020 14:03      ASSESSMENT & PLAN:  1. Iron deficiency anemia, unspecified iron deficiency anemia type   2. History of Roux-en-Y gastric bypass   3. Inguinal lymphadenopathy    Anemia Labs are reviewed and discussed with patient. Iron panel has improved. No IV Venofer.   B12 deficiency.  Recommend patient to start weekly IM B12 1070mg x 4 followed by monthly B12 injections.   Right inguinal lymphadenopathy.check right groin [place]   Anxiety/depression, continue follow-up with psychiatrist. All questions were answered. The patient knows to call the clinic with any problems questions or concerns.  Cc McLean-Scocuzza, TOlivia Mackie*  Return of visit: 6 months  ZEarlie Server MD, PhD 01/07/2021

## 2021-01-08 ENCOUNTER — Ambulatory Visit
Admission: RE | Admit: 2021-01-08 | Discharge: 2021-01-08 | Disposition: A | Discharge status: Home / Self Care | Payer: 59 | Source: Ambulatory Visit | Attending: Obstetrics and Gynecology | Admitting: Obstetrics and Gynecology

## 2021-01-08 DIAGNOSIS — Z780 Asymptomatic menopausal state: Secondary | ICD-10-CM

## 2021-01-08 DIAGNOSIS — M549 Dorsalgia, unspecified: Secondary | ICD-10-CM | POA: Diagnosis present

## 2021-01-08 DIAGNOSIS — G8929 Other chronic pain: Secondary | ICD-10-CM | POA: Insufficient documentation

## 2021-01-08 DIAGNOSIS — Z13820 Encounter for screening for osteoporosis: Secondary | ICD-10-CM | POA: Diagnosis present

## 2021-01-13 ENCOUNTER — Ambulatory Visit: Payer: 59

## 2021-01-13 ENCOUNTER — Inpatient Hospital Stay: Payer: 59

## 2021-01-14 ENCOUNTER — Ambulatory Visit: Admission: RE | Admit: 2021-01-14 | Payer: 59 | Source: Ambulatory Visit

## 2021-01-20 ENCOUNTER — Other Ambulatory Visit: Payer: Self-pay

## 2021-01-20 ENCOUNTER — Ambulatory Visit
Admission: RE | Admit: 2021-01-20 | Discharge: 2021-01-20 | Disposition: A | Discharge status: Home / Self Care | Payer: 59 | Source: Ambulatory Visit | Attending: Obstetrics and Gynecology | Admitting: Obstetrics and Gynecology

## 2021-01-20 ENCOUNTER — Inpatient Hospital Stay: Payer: 59

## 2021-01-20 ENCOUNTER — Ambulatory Visit
Admission: RE | Admit: 2021-01-20 | Discharge: 2021-01-20 | Disposition: A | Discharge status: Home / Self Care | Payer: 59 | Source: Ambulatory Visit | Attending: Oncology | Admitting: Oncology

## 2021-01-20 DIAGNOSIS — Z1231 Encounter for screening mammogram for malignant neoplasm of breast: Secondary | ICD-10-CM | POA: Diagnosis present

## 2021-01-20 DIAGNOSIS — D509 Iron deficiency anemia, unspecified: Secondary | ICD-10-CM | POA: Insufficient documentation

## 2021-01-20 DIAGNOSIS — R591 Generalized enlarged lymph nodes: Secondary | ICD-10-CM | POA: Diagnosis not present

## 2021-01-20 MED ORDER — CYANOCOBALAMIN 1000 MCG/ML IJ SOLN
1000.0000 ug | Freq: Once | INTRAMUSCULAR | Status: AC
  Administered 2021-01-20: 1000 ug via INTRAMUSCULAR
  Filled 2021-01-20: qty 1

## 2021-01-26 ENCOUNTER — Other Ambulatory Visit: Payer: Self-pay | Admitting: Obstetrics and Gynecology

## 2021-01-26 DIAGNOSIS — R928 Other abnormal and inconclusive findings on diagnostic imaging of breast: Secondary | ICD-10-CM

## 2021-01-26 DIAGNOSIS — N6489 Other specified disorders of breast: Secondary | ICD-10-CM

## 2021-01-27 ENCOUNTER — Ambulatory Visit
Admission: RE | Admit: 2021-01-27 | Discharge: 2021-01-27 | Disposition: A | Discharge status: Home / Self Care | Payer: 59 | Source: Ambulatory Visit | Attending: Obstetrics and Gynecology | Admitting: Obstetrics and Gynecology

## 2021-01-27 ENCOUNTER — Inpatient Hospital Stay: Payer: 59

## 2021-01-27 ENCOUNTER — Other Ambulatory Visit: Payer: Self-pay

## 2021-01-27 DIAGNOSIS — N6489 Other specified disorders of breast: Secondary | ICD-10-CM

## 2021-01-27 DIAGNOSIS — R591 Generalized enlarged lymph nodes: Secondary | ICD-10-CM | POA: Diagnosis not present

## 2021-01-27 DIAGNOSIS — D509 Iron deficiency anemia, unspecified: Secondary | ICD-10-CM

## 2021-01-27 DIAGNOSIS — R928 Other abnormal and inconclusive findings on diagnostic imaging of breast: Secondary | ICD-10-CM | POA: Insufficient documentation

## 2021-01-27 MED ORDER — CYANOCOBALAMIN 1000 MCG/ML IJ SOLN
1000.0000 ug | Freq: Once | INTRAMUSCULAR | Status: AC
  Administered 2021-01-27: 1000 ug via INTRAMUSCULAR
  Filled 2021-01-27: qty 1

## 2021-01-30 ENCOUNTER — Encounter: Payer: Self-pay | Admitting: Internal Medicine

## 2021-02-03 ENCOUNTER — Inpatient Hospital Stay: Payer: 59

## 2021-02-03 NOTE — Telephone Encounter (Signed)
Agree with need for evaluation.  

## 2021-02-03 NOTE — Telephone Encounter (Signed)
Called to speak with Shelby Colon. Reagann states that she starting noticing cloudy urine and odor Friday night and pt was not as concerned. On 01/31/21, pt states that she was having urinary pain and noticed blood in her urine. Pt went to the pharmacy and picked up AZO and had a AZO urine test done and it came back positive for Leukocytes. Pt states that she did not go to an urgent care to be evaluated over the weekend and is currently driving to [place] to prepare for a move. Pt was instructed to seek an urgent care in the area, pt states that she is not sure if there is an urgent care close to where she is headed and she will let us know once she arrives. Pt will continue to take AZO to treat her symptoms.

## 2021-02-06 NOTE — Telephone Encounter (Signed)
Called to speak with Shelby Colon. Shelby Colon states that she is still in [place] and is attempting to buy a Condo in [place] and has not had the time to go to an urgent care. Pt is continuing to take AZO for her symptoms. She states that her Bleeding has stopped but there is still odor and the urine is still cloudy. Pt states that she is coming back into town on Sunday and will go to the [facility] for evaluation at that time

## 2021-03-03 ENCOUNTER — Inpatient Hospital Stay: Payer: 59 | Attending: Oncology

## 2021-03-31 ENCOUNTER — Inpatient Hospital Stay: Payer: 59

## 2021-04-29 ENCOUNTER — Other Ambulatory Visit: Payer: 59

## 2021-05-01 ENCOUNTER — Ambulatory Visit: Payer: 59

## 2021-05-01 ENCOUNTER — Ambulatory Visit: Payer: 59 | Admitting: Oncology

## 2021-05-27 ENCOUNTER — Ambulatory Visit: Payer: 59

## 2021-06-11 ENCOUNTER — Encounter: Payer: 59 | Admitting: Internal Medicine

## 2021-06-24 ENCOUNTER — Ambulatory Visit: Payer: 59

## 2021-07-22 ENCOUNTER — Ambulatory Visit: Payer: 59

## 2021-08-04 DIAGNOSIS — R030 Elevated blood-pressure reading, without diagnosis of hypertension: Secondary | ICD-10-CM

## 2021-08-04 DIAGNOSIS — N390 Urinary tract infection, site not specified: Secondary | ICD-10-CM | POA: Insufficient documentation

## 2021-08-04 HISTORY — DX: Elevated blood-pressure reading, without diagnosis of hypertension: R03.0

## 2021-08-19 ENCOUNTER — Other Ambulatory Visit: Payer: 59

## 2021-08-21 ENCOUNTER — Ambulatory Visit: Payer: 59 | Admitting: Oncology

## 2021-08-21 ENCOUNTER — Ambulatory Visit: Payer: 59

## 2021-10-02 IMAGING — US US ABDOMEN COMPLETE
1 series · 13 of 25 positions shown · non-contrast
Comparison: None.

CLINICAL DATA: Elevated liver enzymes

EXAM:
ABDOMEN ULTRASOUND COMPLETE

[Series 1: us abdomen complete · 13 of 101 slices shown]
[im 1/101]
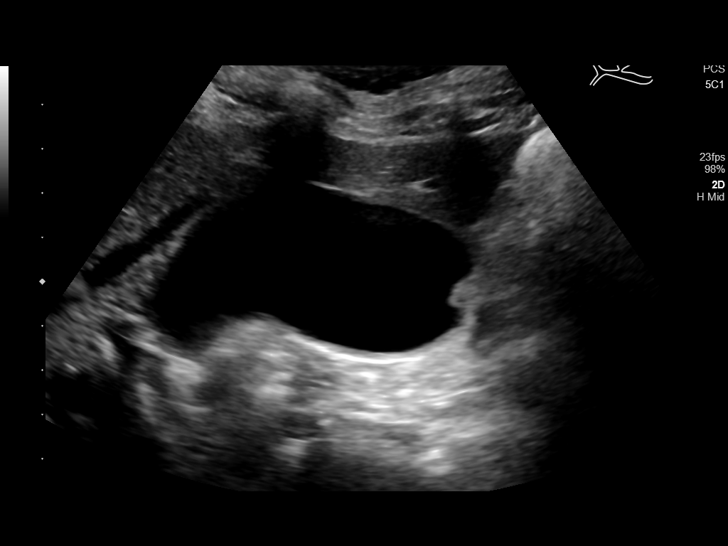
[im 9/101]
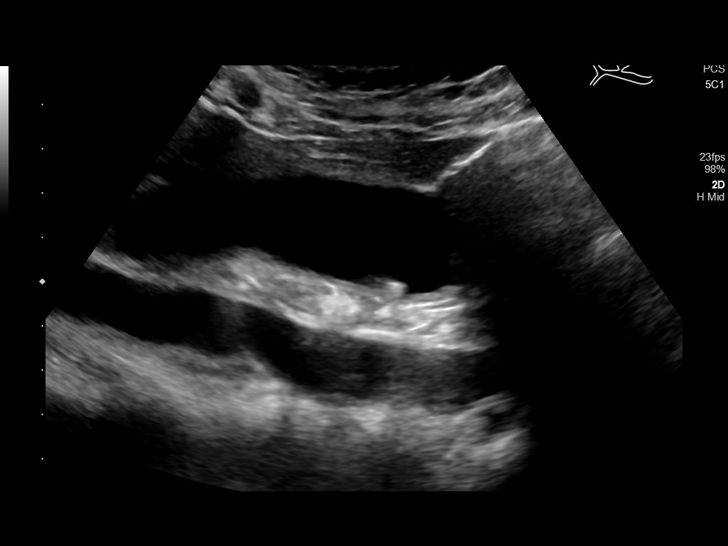
[im 17/101]
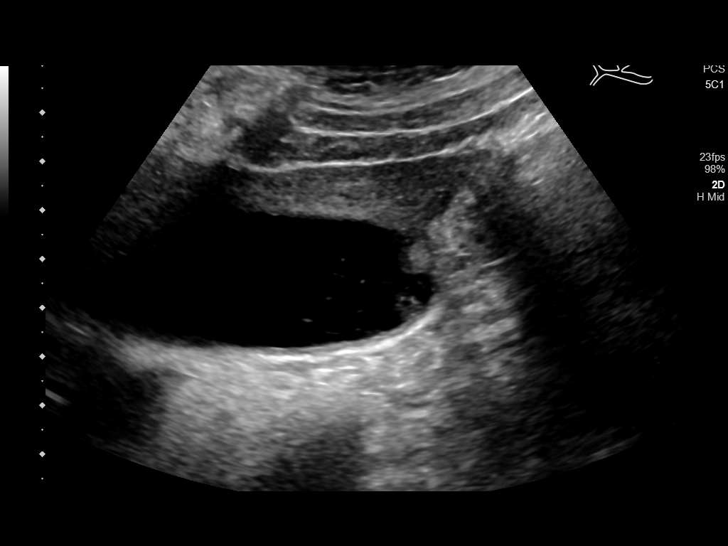
[im 26/101]
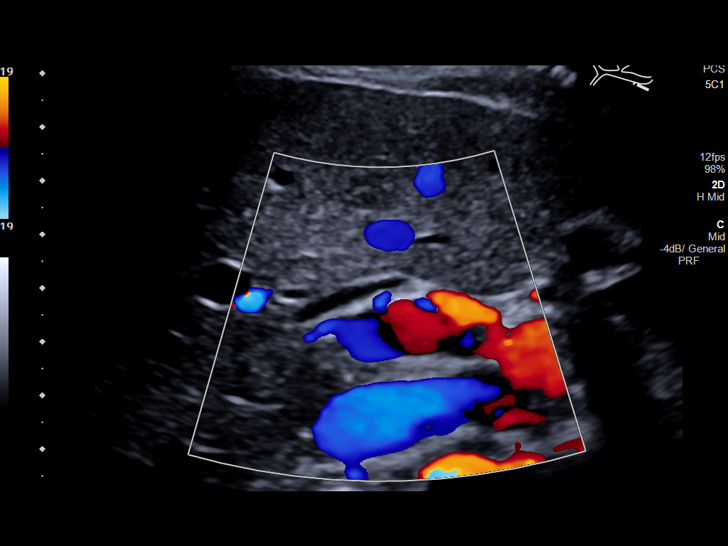
[im 34/101]
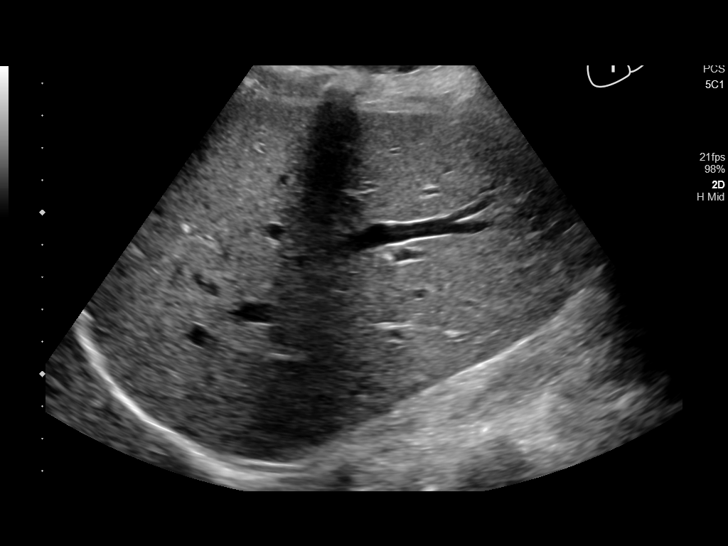
[im 42/101]
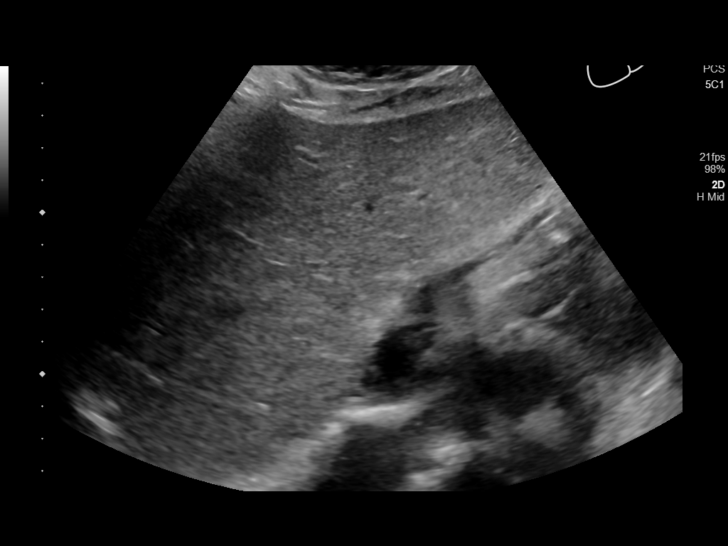
[im 51/101]
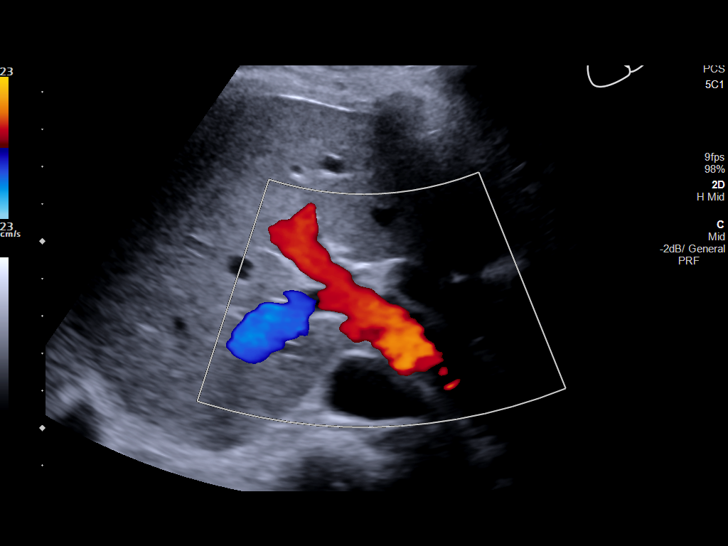
[im 59/101]
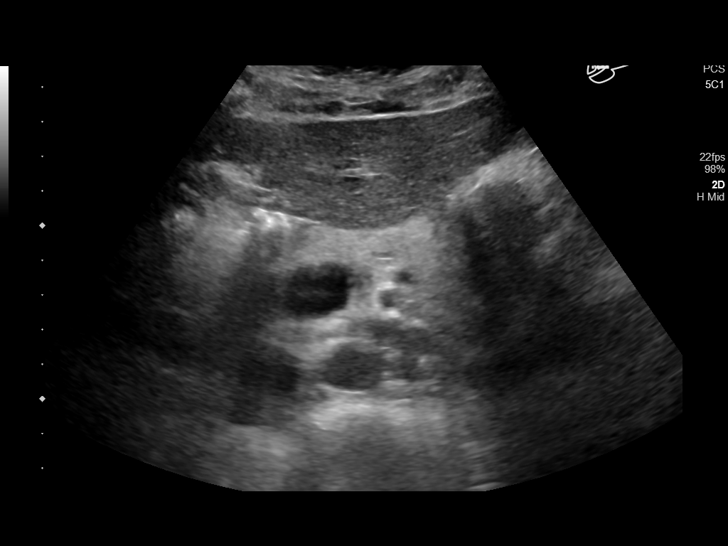
[im 67/101]
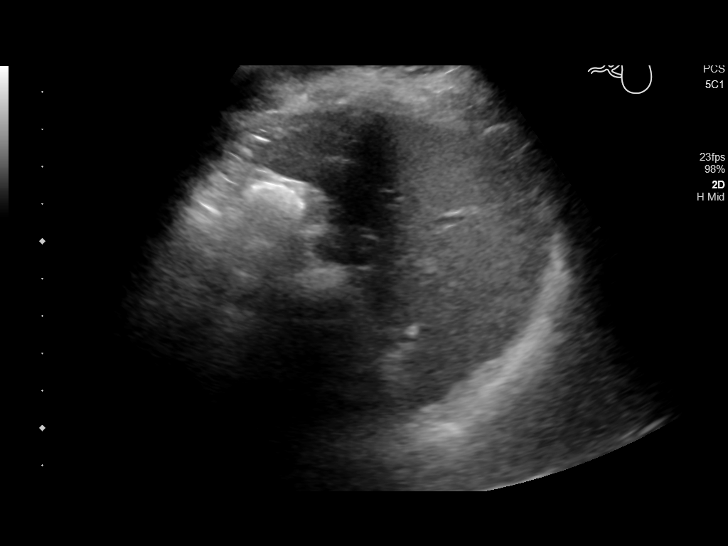
[im 76/101]
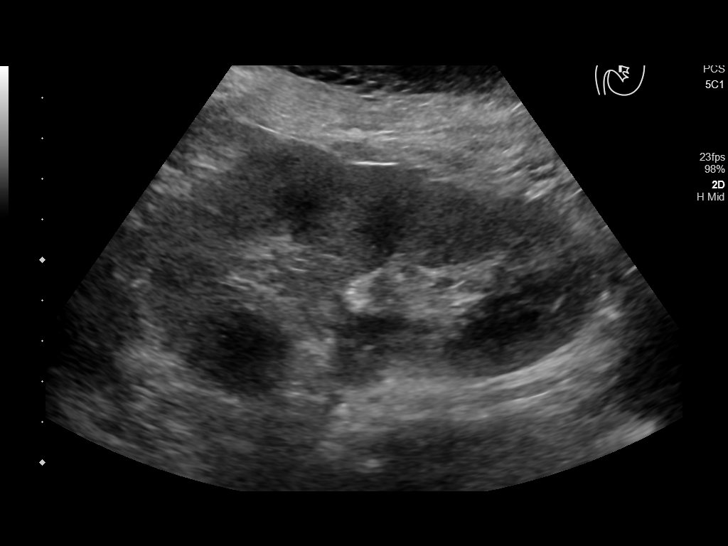
[im 84/101]
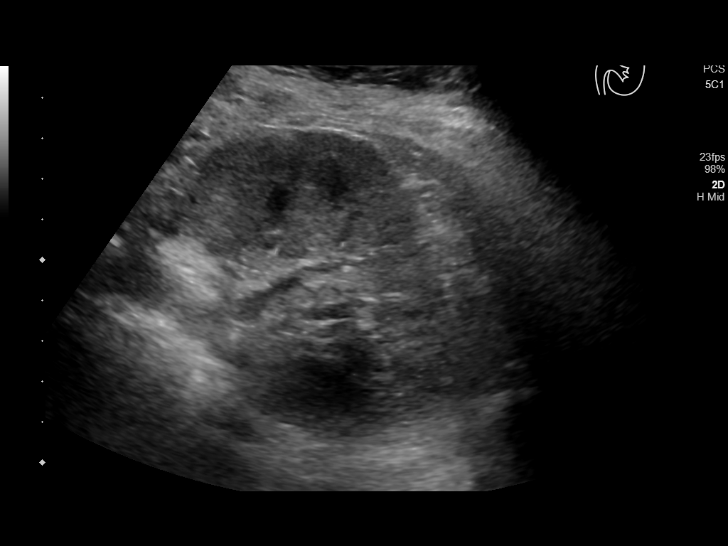
[im 92/101]
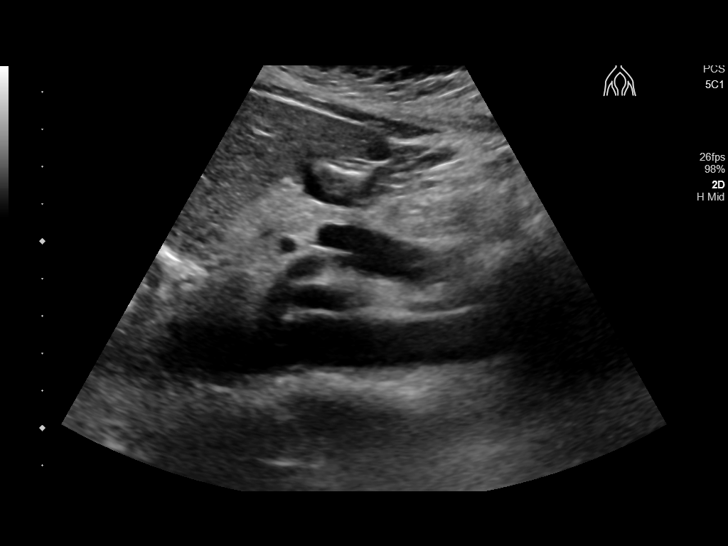
[im 101/101]
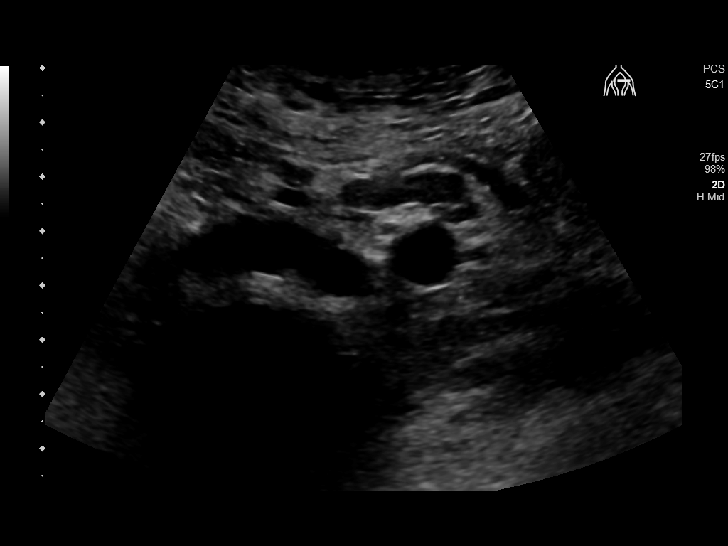

[13 of 25 positions shown; findings below may reference images not displayed]

FINDINGS: Gallbladder: Within the gallbladder, there are small gallstones
intermingled with sludge. Largest gallstone measures 4 mm in length.
There is no appreciable gallbladder wall thickening or
pericholecystic fluid. There also appear to be small cholesterol
crystals within the gallbladder. No sonographic Murphy sign noted by
sonographer.

Common bile duct: Diameter: 4 mm. No intrahepatic or extrahepatic
biliary duct dilatation.

Liver: No focal lesion identified. Within normal limits in
parenchymal echogenicity. Portal vein is patent on color Doppler
imaging with normal direction of blood flow towards the liver.

IVC: No abnormality visualized.

Pancreas: Visualized portion unremarkable. Portions of pancreas
obscured by gas.

Spleen: Size and appearance within normal limits.

Right Kidney: Right kidney is not seen in the right flank position.
A right kidney is not seen on this study.

Left Kidney: Length: 11.2 cm. Echogenicity within normal limits. No
mass or hydronephrosis visualized.

Abdominal aorta: No aneurysm visualized.

Other findings: No demonstrable ascites.
IMPRESSION: 1. Cholelithiasis intermingled with sludge. No gallbladder wall
thickening or pericholecystic fluid.

2. Right kidney not identified on this study. Question absent right
kidney. Ectopically located right kidney is a possibility. In this
regard, it may be reasonable to consider nuclear medicine renal scan
to assess for ectopic renal tissue. Left kidney is in the normal
flank position and appears normal.

3. Portions of pancreas obscured by gas. Visualized portions of
pancreas appear normal.

4.  Study otherwise unremarkable.

## 2021-12-08 ENCOUNTER — Inpatient Hospital Stay
Admission: EM | Admit: 2021-12-08 | Discharge: 2021-12-11 | DRG: 640 | Disposition: A | Discharge status: Home / Self Care | Payer: 59 | Attending: Osteopathic Medicine | Admitting: Osteopathic Medicine

## 2021-12-08 ENCOUNTER — Encounter: Payer: Self-pay | Admitting: Emergency Medicine

## 2021-12-08 ENCOUNTER — Emergency Department: Payer: 59

## 2021-12-08 ENCOUNTER — Encounter: Payer: Self-pay | Admitting: Oncology

## 2021-12-08 ENCOUNTER — Other Ambulatory Visit: Payer: Self-pay

## 2021-12-08 DIAGNOSIS — F0394 Unspecified dementia, unspecified severity, with anxiety: Secondary | ICD-10-CM | POA: Diagnosis present

## 2021-12-08 DIAGNOSIS — Z9884 Bariatric surgery status: Secondary | ICD-10-CM

## 2021-12-08 DIAGNOSIS — D75839 Thrombocytosis, unspecified: Secondary | ICD-10-CM | POA: Diagnosis present

## 2021-12-08 DIAGNOSIS — E876 Hypokalemia: Secondary | ICD-10-CM | POA: Diagnosis not present

## 2021-12-08 DIAGNOSIS — E861 Hypovolemia: Secondary | ICD-10-CM | POA: Diagnosis not present

## 2021-12-08 DIAGNOSIS — Z981 Arthrodesis status: Secondary | ICD-10-CM

## 2021-12-08 DIAGNOSIS — Z885 Allergy status to narcotic agent status: Secondary | ICD-10-CM

## 2021-12-08 DIAGNOSIS — R7401 Elevation of levels of liver transaminase levels: Secondary | ICD-10-CM

## 2021-12-08 DIAGNOSIS — R4182 Altered mental status, unspecified: Secondary | ICD-10-CM | POA: Diagnosis not present

## 2021-12-08 DIAGNOSIS — I10 Essential (primary) hypertension: Secondary | ICD-10-CM | POA: Diagnosis present

## 2021-12-08 DIAGNOSIS — R739 Hyperglycemia, unspecified: Secondary | ICD-10-CM | POA: Diagnosis present

## 2021-12-08 DIAGNOSIS — K7200 Acute and subacute hepatic failure without coma: Secondary | ICD-10-CM | POA: Diagnosis present

## 2021-12-08 DIAGNOSIS — M069 Rheumatoid arthritis, unspecified: Secondary | ICD-10-CM | POA: Diagnosis present

## 2021-12-08 DIAGNOSIS — G930 Cerebral cysts: Secondary | ICD-10-CM | POA: Diagnosis present

## 2021-12-08 DIAGNOSIS — Z818 Family history of other mental and behavioral disorders: Secondary | ICD-10-CM | POA: Diagnosis not present

## 2021-12-08 DIAGNOSIS — Z79890 Hormone replacement therapy (postmenopausal): Secondary | ICD-10-CM

## 2021-12-08 DIAGNOSIS — E871 Hypo-osmolality and hyponatremia: Principal | ICD-10-CM

## 2021-12-08 DIAGNOSIS — Q600 Renal agenesis, unilateral: Secondary | ICD-10-CM

## 2021-12-08 DIAGNOSIS — E559 Vitamin D deficiency, unspecified: Secondary | ICD-10-CM | POA: Diagnosis present

## 2021-12-08 DIAGNOSIS — Z96653 Presence of artificial knee joint, bilateral: Secondary | ICD-10-CM | POA: Diagnosis present

## 2021-12-08 DIAGNOSIS — G9341 Metabolic encephalopathy: Secondary | ICD-10-CM | POA: Diagnosis not present

## 2021-12-08 DIAGNOSIS — Z8261 Family history of arthritis: Secondary | ICD-10-CM | POA: Diagnosis not present

## 2021-12-08 DIAGNOSIS — M549 Dorsalgia, unspecified: Secondary | ICD-10-CM | POA: Diagnosis present

## 2021-12-08 DIAGNOSIS — Z79899 Other long term (current) drug therapy: Secondary | ICD-10-CM

## 2021-12-08 DIAGNOSIS — F0393 Unspecified dementia, unspecified severity, with mood disturbance: Secondary | ICD-10-CM | POA: Diagnosis not present

## 2021-12-08 DIAGNOSIS — E7800 Pure hypercholesterolemia, unspecified: Secondary | ICD-10-CM | POA: Diagnosis present

## 2021-12-08 DIAGNOSIS — G894 Chronic pain syndrome: Secondary | ICD-10-CM | POA: Diagnosis present

## 2021-12-08 DIAGNOSIS — I4891 Unspecified atrial fibrillation: Secondary | ICD-10-CM | POA: Diagnosis not present

## 2021-12-08 DIAGNOSIS — F339 Major depressive disorder, recurrent, unspecified: Secondary | ICD-10-CM | POA: Diagnosis not present

## 2021-12-08 DIAGNOSIS — R410 Disorientation, unspecified: Secondary | ICD-10-CM

## 2021-12-08 HISTORY — DX: Altered mental status, unspecified: R41.82

## 2021-12-08 HISTORY — DX: Elevation of levels of liver transaminase levels: R74.01

## 2021-12-08 LAB — SODIUM
Sodium: 121 mmol/L — ABNORMAL LOW (ref 135–145)
Sodium: 122 mmol/L — ABNORMAL LOW (ref 135–145)

## 2021-12-08 LAB — COMPREHENSIVE METABOLIC PANEL
ALT: 57 U/L — ABNORMAL HIGH (ref 0–44)
AST: 101 U/L — ABNORMAL HIGH (ref 15–41)
Albumin: 5 g/dL (ref 3.5–5.0)
Alkaline Phosphatase: 127 U/L — ABNORMAL HIGH (ref 38–126)
Anion gap: 13 (ref 5–15)
BUN: 10 mg/dL (ref 6–20)
CO2: 22 mmol/L (ref 22–32)
Calcium: 9.8 mg/dL (ref 8.9–10.3)
Chloride: 85 mmol/L — ABNORMAL LOW (ref 98–111)
Creatinine, Ser: 0.69 mg/dL (ref 0.44–1.00)
GFR, Estimated: >60 mL/min (ref >60)
Glucose, Bld: 152 mg/dL — ABNORMAL HIGH (ref 70–99)
Potassium: 3.4 mmol/L — ABNORMAL LOW (ref 3.5–5.1)
Sodium: 120 mmol/L — ABNORMAL LOW (ref 135–145)
Total Bilirubin: 1.6 mg/dL — ABNORMAL HIGH (ref 0.3–1.2)
Total Protein: 7.9 g/dL (ref 6.5–8.1)

## 2021-12-08 LAB — CBC
HCT: 36.5 % (ref 36.0–46.0)
Hemoglobin: 12.2 g/dL (ref 12.0–15.0)
MCH: 27.4 pg (ref 26.0–34.0)
MCHC: 33.4 g/dL (ref 30.0–36.0)
MCV: 81.8 fL (ref 80.0–100.0)
Platelets: 415 10*3/uL — ABNORMAL HIGH (ref 150–400)
RBC: 4.46 MIL/uL (ref 3.87–5.11)
RDW: 12.5 % (ref 11.5–15.5)
WBC: 10.2 10*3/uL (ref 4.0–10.5)
nRBC: 0 % (ref 0.0–0.2)

## 2021-12-08 LAB — TSH: TSH: 3.817 u[IU]/mL (ref 0.350–4.500)

## 2021-12-08 LAB — ETHANOL: Alcohol, Ethyl (B): <10 mg/dL (ref <10)

## 2021-12-08 LAB — OSMOLALITY: Osmolality: 253 mOsm/kg — ABNORMAL LOW (ref 275–295)

## 2021-12-08 LAB — HIV ANTIBODY (ROUTINE TESTING W REFLEX): HIV Screen 4th Generation wRfx: NONREACTIVE

## 2021-12-08 LAB — SODIUM, URINE, RANDOM: Sodium, Ur: 50 mmol/L

## 2021-12-08 LAB — OSMOLALITY, URINE: Osmolality, Ur: 516 mOsm/kg (ref 300–900)

## 2021-12-08 MED ORDER — DULOXETINE HCL 20 MG PO CPEP: 20.0000 mg | ORAL_CAPSULE | Freq: Every day | ORAL | Status: DC

## 2021-12-08 MED ORDER — POTASSIUM CHLORIDE CRYS ER 20 MEQ PO TBCR
40.0000 meq | EXTENDED_RELEASE_TABLET | Freq: Once | ORAL | Status: AC
  Administered 2021-12-08: 40 meq via ORAL
  Filled 2021-12-08: qty 2

## 2021-12-08 MED ORDER — PREGABALIN 50 MG PO CAPS: 50.0000 mg | ORAL_CAPSULE | Freq: Three times a day (TID) | ORAL | Status: DC

## 2021-12-08 MED ORDER — ENOXAPARIN SODIUM 40 MG/0.4ML IJ SOSY
40.0000 mg | PREFILLED_SYRINGE | INTRAMUSCULAR | Status: DC
  Administered 2021-12-08 – 2021-12-10 (×3): 40 mg via SUBCUTANEOUS
  Filled 2021-12-08 (×3): qty 0.4

## 2021-12-08 MED ORDER — SODIUM CHLORIDE 1 G PO TABS
2.0000 g | ORAL_TABLET | Freq: Three times a day (TID) | ORAL | Status: DC
  Administered 2021-12-08: 2 g via ORAL
  Filled 2021-12-08 (×3): qty 2

## 2021-12-08 MED ORDER — PREGABALIN 50 MG PO CAPS
50.0000 mg | ORAL_CAPSULE | Freq: Two times a day (BID) | ORAL | Status: DC
Start: 2021-12-09 — End: 2021-12-11
  Administered 2021-12-09 – 2021-12-11 (×4): 50 mg via ORAL
  Filled 2021-12-08 (×5): qty 1

## 2021-12-08 MED ORDER — DULOXETINE HCL 30 MG PO CPEP
60.0000 mg | ORAL_CAPSULE | Freq: Every day | ORAL | Status: DC
  Administered 2021-12-09 – 2021-12-11 (×3): 60 mg via ORAL
  Filled 2021-12-08: qty 1
  Filled 2021-12-08 (×2): qty 2

## 2021-12-08 MED ORDER — SODIUM CHLORIDE 3 % IV SOLN
INTRAVENOUS | Status: DC
  Filled 2021-12-08 (×2): qty 500

## 2021-12-08 MED ORDER — PREGABALIN 75 MG PO CAPS
150.0000 mg | ORAL_CAPSULE | Freq: Every day | ORAL | Status: DC
Start: 2021-12-08 — End: 2021-12-11
  Administered 2021-12-08 – 2021-12-10 (×3): 150 mg via ORAL
  Filled 2021-12-08 (×3): qty 2

## 2021-12-08 MED ORDER — SODIUM CHLORIDE 0.9 % IV BOLUS: 1000.0000 mL | Freq: Once | INTRAVENOUS | Status: DC

## 2021-12-08 NOTE — ED Notes (Signed)
Pt placed on purewick for frequent urination

## 2021-12-08 NOTE — ED Triage Notes (Addendum)
Patient to ED via POV for altered mental status x2 days. Per patient's friend- "she doesn't seem to be processing like normal." No urinary symptoms at this time.  Patient oriented to self and time but slow to answer questions.

## 2021-12-08 NOTE — H&P (Addendum)
History and Physical    Patient: Shelby Colon URK:[id number] DOB: 1966-01-29 DOA: 12/08/2021 DOS: the patient was seen and examined on 12/08/2021 PCP: Pcp, No  Patient coming from: Home  Chief Complaint:  Chief Complaint  Patient presents with   Altered Mental Status   HPI: Shelby Colon is a 56 y.o. female with medical history significant of transaminitis, depression who presents to the emergency department due to 2-day onset of altered mental status.  Patient was unable to provide history, history was obtained from ED physician and friend at bedside.  Per report, friend noted confusion in patient on Sunday (2 days ago), she thought it was due to some alcohol (though patient does not drink on daily basis).  Yesterday, she noted worsening in patient's mental status, she took  all beers out of patient's house (though, she does not think that the confusion was due to any alcohol drink), patient's confusion did not improve on seeing her today, so she decided to bring her to the ED for further evaluation and management.  Patient denies any fever, chest pain, shortness of breath, fever or chills.   ED Course:  In the emergency department, she was hemodynamically stable.  Work-up in the ED showed normal CBC except for thrombocytosis, BMP showed hyponatremia, hypokalemia, hyperglycemia and transaminitis including elevated total bilirubin-1.6 CT head without contrast showed no evidence of acute intracranial abnormality 2.9 x 2.3 cm arachnoid cyst overlying the lateral right temporal  lobe, unchanged in size from the prior brain MRI of 02/15/2020. As before, there is mild local mass effect upon the underlying brain parenchyma. Patient was started on sodium chloride tablets.  Hospitalist was asked to admit patient for further evaluation and management.  Review of Systems: Review of systems as noted in the HPI. All other systems reviewed and are negative.  Past Medical History:  Diagnosis Date    Abnormal Pap smear of cervix    h/o LSIL pap with HPV +; 01/2019 pap negative negative HPV   Anxiety    Atrial fibrillation (HCC)    B12 deficiency    Chronic pain syndrome    Colon polyps    Congenital absence of right kidney    Degeneration of lumbar intervertebral disc    Depression    Early menopause    Gastric bypass status for obesity    2013, weight 258lb   Headache    Heart murmur    MVP   History of chicken pox    Hypercholesterolemia    Hypertension    Iron deficiency    Iron deficiency anemia 03/02/2019   Neuropathy    Rheumatoid arthritis ([place])    as child    Scoliosis of lumbar spine    Solitary kidney, congenital    pt thinks has left kidney    Thyroid disease    in the past    UTI (urinary tract infection)    Vitamin D deficiency    Past Surgical History:  Procedure Laterality Date   ANTERIOR CERVICAL DECOMPRESSION/DISCECTOMY FUSION 4 LEVELS N/A 11/28/2019   Procedure: ANTERIOR CERVICAL DECOMPRESSION/DISCECTOMY FUSION 4 LEVELS C3-7;  Surgeon: Meade Maw, MD;  Location: [facility];  Service: Neurosurgery;  Laterality: N/A;   AUGMENTATION MAMMAPLASTY Bilateral 2009   BACK SURGERY  11/04/2014   fusion of spine of lumbosacral region 2 rods and 16 screws    BREAST ENHANCEMENT SURGERY     COLONOSCOPY WITH PROPOFOL N/A 01/06/2018   Procedure: COLONOSCOPY WITH PROPOFOL;  Surgeon: Virgel Manifold, MD;  Location:  [facility];  Service: Endoscopy;  Laterality: N/A;   COLONOSCOPY WITH PROPOFOL N/A 03/19/2019   Procedure: COLONOSCOPY WITH PROPOFOL;  Surgeon: Virgel Manifold, MD;  Location: [facility];  Service: Endoscopy;  Laterality: N/A;   excess skin removal     KNEE ARTHROSCOPY Bilateral    x 7 knees b/l (2 surgeries on right knee) total 1 bone graft and 5 arthroscopy total knee    ROUX-EN-Y PROCEDURE     TOTAL KNEE ARTHROPLASTY     left 09/21/18 [name] [name]   TOTAL KNEE ARTHROPLASTY     right knee Dr. Lavonia Drafts      Social History:  reports that she has never smoked. She has never used smokeless tobacco. She reports current alcohol use. She reports that she does not use drugs.   Allergies  Allergen Reactions   Morphine Nausea And Vomiting    Family History  Adopted: Yes  Problem Relation Age of Onset   Arthritis Mother    Drug abuse Mother        heriod OD   Early death Mother    Diabetes Father    Hypertension Father    Depression Father    Arthritis Father    Drug abuse Father    Diabetes Sister    Fibromyalgia Sister    Rashes / Skin problems Sister        PSORIASIS   Diabetes Brother    Osteoarthritis Maternal Grandmother    Aneurysm Maternal Aunt    Thyroid disease Maternal Aunt    Fibromyalgia Maternal Aunt    Breast cancer Maternal Aunt    Cancer Maternal Aunt        breast cancer      Prior to Admission medications   Medication Sig Start Date End Date Taking? Authorizing Provider  Biotin w/ Vitamins C & E (HAIR/SKIN/NAILS PO) Take 2 tablets by mouth daily. Patient not taking: Reported on 01/07/2021    [provider]  conjugated estrogens (PREMARIN) vaginal cream Place 1 Applicatorful vaginally 2 (two) times a week. 07/03/20   Malachy Mood, MD  diclofenac Sodium (VOLTAREN) 1 % GEL Apply 1 application topically 4 (four) times daily as needed (knee pain/lower back pain.).    [provider]  DULoxetine (CYMBALTA) 20 MG capsule Take 1 capsule (20 mg total) by mouth daily. 10/31/19   McLean-Scocuzza, Nino Glow, MD  hydroxychloroquine (PLAQUENIL) 200 MG tablet Take 200 mg by mouth 2 (two) times daily.    [provider]  methocarbamol (ROBAXIN) 750 MG tablet Take 1 tablet by mouth every 6 (six) hours as needed. 06/12/20   [provider]  NON FORMULARY Liquid vitamin Miracle 2000    [provider]  OVER THE COUNTER MEDICATION Take 1 Dose by mouth daily. Kratom    [provider]  pregabalin (LYRICA) 50 MG capsule Take 50 mg  by mouth 3 (three) times daily. 12/25/20   [provider]    Physical Exam: BP 135/71   Pulse 94   Temp 98.8 F (37.1 C) (Oral)   Resp 18   Ht '5\' 2"'$  (1.575 m)   SpO2 100%   BMI 23.76 kg/m   General: 56 y.o. year-old female well developed well nourished in no acute distress.  Alert and oriented x3. HEENT: NCAT, EOMI Neck: Supple, trachea medial Cardiovascular: Regular rate and rhythm with no rubs or gallops.  No thyromegaly or JVD noted.  No lower extremity edema. 2/4 pulses in all 4 extremities.  Respiratory: Clear to auscultation with no wheezes or rales. Good inspiratory effort. Abdomen: Soft, nontender nondistended with normal bowel sounds x4 quadrants. Muskuloskeletal: No cyanosis, clubbing or edema noted bilaterally Neuro: Confused.  CN II-XII intact, strength 5/5 x 4, sensation, reflexes intact Skin: No ulcerative lesions noted or rashes Psychiatry: Mood is appropriate for condition and setting          Labs on Admission:  Basic Metabolic Panel: Recent Labs  Lab 12/08/21 1129  NA 120*  K 3.4*  CL 85*  CO2 22  GLUCOSE 152*  BUN 10  CREATININE 0.69  CALCIUM 9.8   Liver Function Tests: Recent Labs  Lab 12/08/21 1129  AST 101*  ALT 57*  ALKPHOS 127*  BILITOT 1.6*  PROT 7.9  ALBUMIN 5.0   No results for input(s): "LIPASE", "AMYLASE" in the last 168 hours. No results for input(s): "AMMONIA" in the last 168 hours. CBC: Recent Labs  Lab 12/08/21 1129  WBC 10.2  HGB 12.2  HCT 36.5  MCV 81.8  PLT 415*   Cardiac Enzymes: No results for input(s): "CKTOTAL", "CKMB", "CKMBINDEX", "TROPONINI" in the last 168 hours.  BNP (last 3 results) No results for input(s): "BNP" in the last 8760 hours.  ProBNP (last 3 results) No results for input(s): "PROBNP" in the last 8760 hours.  CBG: No results for input(s): "GLUCAP" in the last 168 hours.  Radiological Exams on Admission: CT HEAD WO CONTRAST (5MM)  Result Date: 12/08/2021 CLINICAL DATA:   Provided history: Altered mental status, nontraumatic. EXAM: CT HEAD WITHOUT CONTRAST TECHNIQUE: Contiguous axial images were obtained from the base of the skull through the vertex without intravenous contrast. RADIATION DOSE REDUCTION: This exam was performed according to the departmental dose-optimization program which includes automated exposure control, adjustment of the mA and/or kV according to patient size and/or use of iterative reconstruction technique. COMPARISON:  Brain MRI 02/15/2020. FINDINGS: Brain: Cerebral volume is normal. 2.9 x 1.3 x 2.3 cm CSF density focus overlying the lateral right temporal lobe, compatible with an arachnoid cyst and unchanged in size from the prior brain MRI of 02/15/2020. As before, there is mild local mass effect upon the underlying brain parenchyma. There is no acute intracranial hemorrhage. No demarcated cortical infarct. No evidence of an intracranial mass. No midline shift. Vascular: No hyperdense vessel.  Atherosclerotic calcifications. Skull: No fracture or aggressive osseous lesion. Sinuses/Orbits: No mass or acute finding within the imaged orbits. No significant paranasal sinus disease at the imaged levels. IMPRESSION: No evidence of acute intracranial abnormality. 2.9 x 2.3 cm arachnoid cyst overlying the lateral right temporal lobe, unchanged in size from the prior brain MRI of 02/15/2020. As before, there is mild local mass effect upon the underlying brain parenchyma. Electronically Signed   By: Kellie Simmering D.O.   On: 12/08/2021 12:10    EKG: I independently viewed the EKG done and my findings are as followed: Normal sinus rhythm at a rate of 88 bpm  Assessment/Plan Present on Admission:  Hyponatremia  Depression, recurrent ([place])  Thrombocytosis  Principal Problem:   Hyponatremia Active Problems:   Thrombocytosis   Depression, recurrent (HCC)   Altered mental status   Hypokalemia   Transaminitis  Hyponatremia Na 120, cause of hyponatremia  unknown at this time ICU was initially consulted and hypertonic saline infusion was recommended to be deferred at this time and patient was started on oral sodium. Continue to monitor sodium with serial BMPs Urine osmolality, serum osmolality and urine sodium will be checked  Altered  mental status This is possibly due to above There was question about occasional use of alcohol, alcohol level will be checked, monitor patient for any alcohol withdrawal symptoms and consider starting patient on CIWA protocol Patient was confused and forgetful, TSH and vitamin B12 levels will be checked  Hypokalemia K+ 3.4, this will be replenished  Thrombocytosis possibly reactive Platelets 415, continue to monitor  Transaminitis AST 101, ALT 57, ALP 127, total bilirubin 1.6 This is possibly chronic, patient was seen at [facility] in [place] in March, 2023.  Ultrasound limited was done at that time at [place], [place].  Unfortunately, the ultrasound findings report was not seen in care everywhere Patient denies any abdominal pain at this time Continue to monitor liver enzymes Alcohol level pending Consider RUQ ultrasound only if clinically indicated  Depression Continue Cymbalta  Other home medications: Lyrica-patient does not remember why she takes this medication, possibly due to ongoing confusion.  DVT prophylaxis: Lovenox   Advance Care Planning:   Code Status: Full Code   Consults: None  Family Communication: Friend at bedside (all questions answered to satisfaction)  Severity of Illness: The appropriate patient status for this patient is INPATIENT. Inpatient status is judged to be reasonable and necessary in order to provide the required intensity of service to ensure the patient's safety. The patient's presenting symptoms, physical exam findings, and initial radiographic and laboratory data in the context of their chronic comorbidities is felt  to place them at high risk for further clinical deterioration. Furthermore, it is not anticipated that the patient will be medically stable for discharge from the hospital within 2 midnights of admission.   * I certify that at the point of admission it is my clinical judgment that the patient will require inpatient hospital care spanning beyond 2 midnights from the point of admission due to high intensity of service, high risk for further deterioration and high frequency of surveillance required.*  Author: Bernadette Hoit, DO 12/08/2021 3:19 PM  For on call review [email or web address].

## 2021-12-08 NOTE — ED Notes (Signed)
Dr Archie Balboa alerted to pt NA lvl, MD at bedside

## 2021-12-08 NOTE — ED Provider Notes (Signed)
[facility] Provider Note    Event Date/Time   First MD Initiated Contact with Patient 12/08/21 1251     (approximate)   History   Altered Mental Status   HPI  Eriko Economos is a 56 y.o. female  who presents to the emergency deparment today because of concern for altered mental status. Patient is unable to give any history. History is obtained from friend at bedside. Friend had noticed that patient was slightly confused 2 days ago, however thought it might just be due to some alcohol (although friend states patient does not drink daily). However yesterday and today patient has seemed worse. Patient has had confusion.   Physical Exam   Triage Vital Signs: ED Triage Vitals  Enc Vitals Group     BP 12/08/21 1125 131/88     Pulse Rate 12/08/21 1125 87     Resp 12/08/21 1125 18     Temp 12/08/21 1125 98.8 F (37.1 C)     Temp Source 12/08/21 1125 Oral     SpO2 12/08/21 1125 99 %     Weight --      Height 12/08/21 1127 '5\' 2"'$  (1.575 m)     Head Circumference --      Peak Flow --      Pain Score 12/08/21 1127 0     Pain Loc --      Pain Edu? --      Excl. in [place]? --     Most recent vital signs: Vitals:   12/08/21 1125  BP: 131/88  Pulse: 87  Resp: 18  Temp: 98.8 F (37.1 C)  SpO2: 99%    General: Awake, alert, oriented to year but not month or events. CV:  Good peripheral perfusion. Regular rate and rhythm. Resp:  Normal effort. Lungs clear. Abd:  No distention.   ED Results / Procedures / Treatments   Labs (all labs ordered are listed, but only abnormal results are displayed) Labs Reviewed  COMPREHENSIVE METABOLIC PANEL - Abnormal; Notable for the following components:      Result Value   Sodium 120 (*)    Potassium 3.4 (*)    Chloride 85 (*)    Glucose, Bld 152 (*)    AST 101 (*)    ALT 57 (*)    Alkaline Phosphatase 127 (*)    Total Bilirubin 1.6 (*)    All other components within normal limits  CBC - Abnormal; Notable for the  following components:   Platelets 415 (*)    All other components within normal limits  CBG MONITORING, ED  POC URINE PREG, ED     EKG  I, Nance Pear, attending physician, personally viewed and interpreted this EKG  EKG Time: 1134 Rate: 88 Rhythm: normal sinus rhythm Axis: normal Intervals: qtc 471 QRS: narrow, LVH ST changes: no st elevation Impression: abnormal ekg    RADIOLOGY I independently interpreted and visualized the CT head. My interpretation: No bleed Radiology interpretation:  IMPRESSION:  No evidence of acute intracranial abnormality.    2.9 x 2.3 cm arachnoid cyst overlying the lateral right temporal  lobe, unchanged in size from the prior brain MRI of 02/15/2020. As  before, there is mild local mass effect upon the underlying brain  parenchyma.     PROCEDURES:  Critical Care performed: Yes  CRITICAL CARE Performed by: Nance Pear   Total critical care time: 35 minutes  Critical care time was exclusive of separately billable procedures and treating other  patients.  Critical care was necessary to treat or prevent imminent or life-threatening deterioration.  Critical care was time spent personally by me on the following activities: development of treatment plan with patient and/or surrogate as well as nursing, discussions with consultants, evaluation of patient's response to treatment, examination of patient, obtaining history from patient or surrogate, ordering and performing treatments and interventions, ordering and review of laboratory studies, ordering and review of radiographic studies, pulse oximetry and re-evaluation of patient's condition.   Procedures   MEDICATIONS ORDERED IN ED: Medications  sodium chloride 0.9 % bolus 1,000 mL (has no administration in time range)     IMPRESSION / MDM / ASSESSMENT AND PLAN / ED COURSE  I reviewed the triage vital signs and the nursing notes.                              Differential  diagnosis includes, but is not limited to, intracranial bleed, mass, anemia, electrolyte abnormality, infection.  Patient's presentation is most consistent with acute presentation with potential threat to life or bodily function.  Patient presents to the emergency department today because of concerns for confusion that has gotten worse over the past couple of days.  Patient cannot give a good history and is not completely oriented.  Patient's head CT without any acute abnormalities.  Blood work did come back with significant hyponatremia.  I did discuss briefly with Dr. Lanney Gins with the ICU.  At this time he recommended deferring hypertonic saline.  Given desire for controlled and slow repletion of potassium recommended oral sodium.  I discussed with Dr. Josephine Cables with the hospitalist service who will plan on admission.  FINAL CLINICAL IMPRESSION(S) / ED DIAGNOSES   Final diagnoses:  Hyponatremia  Altered mental status, unspecified altered mental status type      Note:  This document was prepared using Dragon voice recognition software and may include unintentional dictation errors.    Nance Pear, MD 12/08/21 1416

## 2021-12-08 NOTE — ED Notes (Signed)
PT assisted to toilet.  

## 2021-12-08 NOTE — ED Notes (Signed)
Pt to toilet, pt unable to use purewick at this time.

## 2021-12-08 NOTE — ED Notes (Signed)
Pt assisted to toilet 

## 2021-12-08 NOTE — ED Notes (Signed)
Lab to run ethanol lvl from primary set of labs sent this morning

## 2021-12-08 NOTE — ED Notes (Signed)
Pt back to bed with assist. Pt resting in bed, given remote. No needs verbalized at this time.

## 2021-12-09 ENCOUNTER — Encounter: Payer: Self-pay | Admitting: Osteopathic Medicine

## 2021-12-09 DIAGNOSIS — E871 Hypo-osmolality and hyponatremia: Secondary | ICD-10-CM | POA: Diagnosis not present

## 2021-12-09 LAB — CBC
HCT: 36.7 % (ref 36.0–46.0)
Hemoglobin: 12 g/dL (ref 12.0–15.0)
MCH: 27.3 pg (ref 26.0–34.0)
MCHC: 32.7 g/dL (ref 30.0–36.0)
MCV: 83.4 fL (ref 80.0–100.0)
Platelets: 363 10*3/uL (ref 150–400)
RBC: 4.4 MIL/uL (ref 3.87–5.11)
RDW: 12.8 % (ref 11.5–15.5)
WBC: 7.9 10*3/uL (ref 4.0–10.5)
nRBC: 0 % (ref 0.0–0.2)

## 2021-12-09 LAB — MAGNESIUM: Magnesium: 1.9 mg/dL (ref 1.7–2.4)

## 2021-12-09 LAB — COMPREHENSIVE METABOLIC PANEL
ALT: 50 U/L — ABNORMAL HIGH (ref 0–44)
AST: 88 U/L — ABNORMAL HIGH (ref 15–41)
Albumin: 4.2 g/dL (ref 3.5–5.0)
Alkaline Phosphatase: 109 U/L (ref 38–126)
Anion gap: 8 (ref 5–15)
BUN: 8 mg/dL (ref 6–20)
CO2: 28 mmol/L (ref 22–32)
Calcium: 9.2 mg/dL (ref 8.9–10.3)
Chloride: 95 mmol/L — ABNORMAL LOW (ref 98–111)
Creatinine, Ser: 0.74 mg/dL (ref 0.44–1.00)
GFR, Estimated: >60 mL/min (ref >60)
Glucose, Bld: 102 mg/dL — ABNORMAL HIGH (ref 70–99)
Potassium: 3.3 mmol/L — ABNORMAL LOW (ref 3.5–5.1)
Sodium: 131 mmol/L — ABNORMAL LOW (ref 135–145)
Total Bilirubin: 1.4 mg/dL — ABNORMAL HIGH (ref 0.3–1.2)
Total Protein: 7.2 g/dL (ref 6.5–8.1)

## 2021-12-09 LAB — CREATININE, URINE, RANDOM: Creatinine, Urine: 135 mg/dL

## 2021-12-09 LAB — SODIUM
Sodium: 127 mmol/L — ABNORMAL LOW (ref 135–145)
Sodium: 130 mmol/L — ABNORMAL LOW (ref 135–145)
Sodium: 134 mmol/L — ABNORMAL LOW (ref 135–145)
Sodium: 134 mmol/L — ABNORMAL LOW (ref 135–145)

## 2021-12-09 LAB — OSMOLALITY, URINE: Osmolality, Ur: 559 mOsm/kg (ref 300–900)

## 2021-12-09 LAB — VITAMIN B12: Vitamin B-12: 406 pg/mL (ref 180–914)

## 2021-12-09 LAB — APTT: aPTT: 32 seconds (ref 24–36)

## 2021-12-09 LAB — SODIUM, URINE, RANDOM: Sodium, Ur: 12 mmol/L

## 2021-12-09 LAB — PHOSPHORUS: Phosphorus: 2.9 mg/dL (ref 2.5–4.6)

## 2021-12-09 NOTE — ED Notes (Signed)
RN aware of bed assigned 

## 2021-12-09 NOTE — ED Notes (Signed)
Informed RN of new bed assignment

## 2021-12-09 NOTE — Progress Notes (Signed)
PROGRESS NOTE    Bernetha Anschutz  JJK:[id number] DOB: 11-06-1965  DOA: 12/08/2021 Date of Service: 12/09/21 PCP: Merryl Hacker, No     Brief Narrative / Hospital Course:  Patient is a 56 year old female with past medical history transaminitis, depression, chronic musculoskeletal pain, congenital single kidney, presents to ED with friend 12/08/2021 CC mental confusion x2 days.  Initially thought due to some alcohol though reportedly patient does not drink on a daily basis.  Friend was noticing worsening mental status and brought patient to ED. 07/11: Hemodynamically stable, no concerns on CBC except thrombocytosis, BMP showed significant hyponatremia as well as hypokalemia, hyperglycemia, transaminitis.  CT head no concerns.  She did have 2.9 x 2.3 cm arachnoid cyst overlying the right temporal lobe unchanged in size from prior brain MRI 02/15/2020, mild local mass effect upon the underlying brain parenchyma.  ICU was initially consulted but recommended defer hypertonic saline infusion.   Patient started on sodium chloride tablets and admitted to hospitalist service.  07/12: A.m. corrected fairly rapidly from 120 at 11:30 AM on 12/08/2021, was up to 131 at 04:30 on 12/09/2021 at which point sodium chloride tablets were DC'd.  Other than feeling tired) reported she was confused and not remembering things well.  Reports history of congenital singular kidney.  History of gastric bypass surgery   Consultants:  none  Procedures: none    Subjective: Patient reports doing well and is not feeling as confused/unsteady.  She states that lying in bed in the ED bed has caused some issues with her typical lower extremity neuropathy, has extensive musculoskeletal history with several spinal surgeries and hardware present.  Does not feel that her leg pain is worse than her usual, getting up and walking around helps it.     ASSESSMENT & PLAN:   Principal Problem:   Hypo-osmolar hyponatremia Active Problems:    Thrombocytosis   Depression, recurrent (HCC)   Altered mental status   Hypokalemia   Transaminitis   Hypo-osmolar hyponatremia Serum Osm low + Urine Osm 516 (WNL) + Urine Na 50 --> Will re-measure urine osm and Na If urine Na <40, Urine Osm <100 would support hypovolemic hyponatremia.  If Urine Na >40 and Urine Osm >100 would need to evaluate for glucocorticoid deficiency, TSH  Transaminitis Improving, trend a.m. CMP Hypovolemia would explain low sodium and but also support diagnosis of mild shock liver/transaminitis due to watershed effect.  Thrombocytosis Elevated platelets on admission, most likely reactive  resolved on subsequent blood draw, monitor CBC.  Hypokalemia Replete as needed Follow-up BMP in a.m.     DVT prophylaxis: lovenox Code Status: FULL Family Communication: will call husband later today and update note if able to reach him Disposition Plan / TOC needs: Anticipate discharge back home when medically stable Barriers to discharge / significant pending items: Following hyponatremia work-up and ensure stabilization and confirm diagnosis of underlying etiology.  Hopefully will be stable for discharge 12/10/2021 or 12/11/2021.             Objective: Vitals:   12/09/21 0745 12/09/21 1118 12/09/21 1206 12/09/21 1207  BP: 139/65 (!) 143/126  137/88  Pulse: 71 78  70  Resp: (!) 23 (!) 24  15  Temp:    98.2 F (36.8 C)  TempSrc:      SpO2: 97% 100%  100%  Weight:   58.8 kg   Height:   '5\' 2"'$  (1.575 m)    No intake or output data in the 24 hours ending 12/09/21 [place]  12/09/21 1206  Weight: 58.8 kg    Examination:  Constitutional:  VS as above General Appearance: alert, well-developed, well-nourished, NAD Eyes: Normal lids and conjunctive, non-icteric sclera Ears, Nose, Mouth, Throat: Normal appearance Neck: No masses, trachea midline Respiratory: Normal respiratory effort Breath sounds normal, no  wheeze/rhonchi/rales Cardiovascular: S1/S2 normal, no murmur/rub/gallop auscultated No lower extremity edema Gastrointestinal: Nontender, no masses No hernia appreciated Musculoskeletal:  No clubbing/cyanosis of digits Neurological: No cranial nerve deficit on limited exam Psychiatric: Normal judgment/insight Normal mood and affect       Scheduled Medications:   DULoxetine  60 mg Oral Daily   enoxaparin (LOVENOX) injection  40 mg Subcutaneous Q24H   pregabalin  50 mg Oral BID   And   pregabalin  150 mg Oral QHS    Continuous Infusions:   PRN Medications:    Antimicrobials:  Anti-infectives (From admission, onward)    None       Data Reviewed: I have personally reviewed following labs and imaging studies  CBC: Recent Labs  Lab 12/08/21 1129 12/09/21 0433  WBC 10.2 7.9  HGB 12.2 12.0  HCT 36.5 36.7  MCV 81.8 83.4  PLT 415* 160   Basic Metabolic Panel: Recent Labs  Lab 12/08/21 1129 12/08/21 1525 12/08/21 1945 12/08/21 2320 12/09/21 0234 12/09/21 0433 12/09/21 1258  NA 120*   < > 122* 127* 130* 131* 134*  K 3.4*  --   --   --   --  3.3*  --   CL 85*  --   --   --   --  95*  --   CO2 22  --   --   --   --  28  --   GLUCOSE 152*  --   --   --   --  102*  --   BUN 10  --   --   --   --  8  --   CREATININE 0.69  --   --   --   --  0.74  --   CALCIUM 9.8  --   --   --   --  9.2  --   MG  --   --   --   --   --  1.9  --   PHOS  --   --   --   --   --  2.9  --    < > = values in this interval not displayed.   GFR: Estimated Creatinine Clearance: 63.6 mL/min (by C-G formula based on SCr of 0.74 mg/dL). Liver Function Tests: Recent Labs  Lab 12/08/21 1129 12/09/21 0433  AST 101* 88*  ALT 57* 50*  ALKPHOS 127* 109  BILITOT 1.6* 1.4*  PROT 7.9 7.2  ALBUMIN 5.0 4.2   No results for input(s): "LIPASE", "AMYLASE" in the last 168 hours. No results for input(s): "AMMONIA" in the last 168 hours. Coagulation Profile: No results for input(s):  "INR", "PROTIME" in the last 168 hours. Cardiac Enzymes: No results for input(s): "CKTOTAL", "CKMB", "CKMBINDEX", "TROPONINI" in the last 168 hours. BNP (last 3 results) No results for input(s): "PROBNP" in the last 8760 hours. HbA1C: No results for input(s): "HGBA1C" in the last 72 hours. CBG: No results for input(s): "GLUCAP" in the last 168 hours. Lipid Profile: No results for input(s): "CHOL", "HDL", "LDLCALC", "TRIG", "CHOLHDL", "LDLDIRECT" in the last 72 hours. Thyroid Function Tests: Recent Labs    12/08/21 1945  TSH 3.817   Anemia Panel: Recent Labs  12/09/21 0433  VITAMINB12 406   Urine analysis:    Component Value Date/Time   COLORURINE YELLOW (A) 11/21/2019 0907   APPEARANCEUR CLEAR (A) 11/21/2019 0907   APPEARANCEUR Clear 02/15/2019 1004   LABSPEC 1.013 11/21/2019 0907   PHURINE 6.0 11/21/2019 0907   GLUCOSEU NEGATIVE 11/21/2019 0907   HGBUR NEGATIVE 11/21/2019 0907   BILIRUBINUR NEGATIVE 11/21/2019 0907   BILIRUBINUR Negative 02/15/2019 [place] 11/21/2019 0907   PROTEINUR NEGATIVE 11/21/2019 0907   NITRITE NEGATIVE 11/21/2019 0907   LEUKOCYTESUR NEGATIVE 11/21/2019 0907   Sepsis Labs: '@LABRCNTIP'$ (procalcitonin:4,lacticidven:4)  No results found for this or any previous visit (from the past 240 hour(s)).       Radiology Studies last 96 hours: CT HEAD WO CONTRAST (5MM)  Result Date: 12/08/2021 CLINICAL DATA:  Provided history: Altered mental status, nontraumatic. EXAM: CT HEAD WITHOUT CONTRAST TECHNIQUE: Contiguous axial images were obtained from the base of the skull through the vertex without intravenous contrast. RADIATION DOSE REDUCTION: This exam was performed according to the departmental dose-optimization program which includes automated exposure control, adjustment of the mA and/or kV according to patient size and/or use of iterative reconstruction technique. COMPARISON:  Brain MRI 02/15/2020. FINDINGS: Brain: Cerebral volume  is normal. 2.9 x 1.3 x 2.3 cm CSF density focus overlying the lateral right temporal lobe, compatible with an arachnoid cyst and unchanged in size from the prior brain MRI of 02/15/2020. As before, there is mild local mass effect upon the underlying brain parenchyma. There is no acute intracranial hemorrhage. No demarcated cortical infarct. No evidence of an intracranial mass. No midline shift. Vascular: No hyperdense vessel.  Atherosclerotic calcifications. Skull: No fracture or aggressive osseous lesion. Sinuses/Orbits: No mass or acute finding within the imaged orbits. No significant paranasal sinus disease at the imaged levels. IMPRESSION: No evidence of acute intracranial abnormality. 2.9 x 2.3 cm arachnoid cyst overlying the lateral right temporal lobe, unchanged in size from the prior brain MRI of 02/15/2020. As before, there is mild local mass effect upon the underlying brain parenchyma. Electronically Signed   By: Kellie Simmering D.O.   On: 12/08/2021 12:10            LOS: 1 day     Emeterio Reeve, DO [facility] 12/09/2021, 2:10 PM   Staff may message me via secure chat in [place]  but this may not receive immediate response,  please page for urgent matters!  If 7PM-7AM, please contact night-coverage [email or web address]  Dictation software was used to generate the above note. Typos may occur and escape review, as with typed/written notes. Please contact Dr Sheppard Coil directly for clarity if needed.

## 2021-12-09 NOTE — Assessment & Plan Note (Signed)
   Improving, trend a.m. CMP  Hypovolemia would explain low sodium and but also support diagnosis of mild shock liver/transaminitis due to watershed effect.

## 2021-12-09 NOTE — Assessment & Plan Note (Signed)
   Elevated platelets on admission, most likely reactive   resolved on subsequent blood draw,  monitor CBC.

## 2021-12-09 NOTE — Assessment & Plan Note (Addendum)
 ?  if single kidney, CT head findings, hx bariatric surgery are involved. May consider MRI brain but cerebral salt wasting is unlikely given urine Na not substantially high .  Serum Osm low + Urine Osm 516 (WNL) + Urine Na 50 --> Will re-measure urine osm and Na. (If urine Na <40, Urine Osm <100 would support hypovolemic hyponatremia. If Urine Na >40 and Urine Osm >100 would need to evaluate for glucocorticoid deficiency, TSH) 07/13: U-Osm 559 and U-Na 12, TSH WNL --> cortisol pending  Relatively rapid correction of sodium with minimal intervention with salt tablets, patient appearing a bit more confused per the husband but see below for assessment/plan regarding altered mental status, on my exam 12/10/2021 she is not acutely encephalopathic though she does appear to be in mild psychiatric distress due to anxiety/depression

## 2021-12-09 NOTE — Assessment & Plan Note (Signed)
   Replete as needed  Follow-up BMP in a.m.

## 2021-12-09 NOTE — ED Notes (Signed)
RN still waiting for medication to be tube by pharmacy. Receiving RN aware

## 2021-12-10 ENCOUNTER — Inpatient Hospital Stay: Payer: 59

## 2021-12-10 DIAGNOSIS — R410 Disorientation, unspecified: Secondary | ICD-10-CM

## 2021-12-10 DIAGNOSIS — E871 Hypo-osmolality and hyponatremia: Secondary | ICD-10-CM | POA: Diagnosis not present

## 2021-12-10 DIAGNOSIS — G930 Cerebral cysts: Secondary | ICD-10-CM

## 2021-12-10 HISTORY — DX: Cerebral cysts: G93.0

## 2021-12-10 HISTORY — DX: Disorientation, unspecified: R41.0

## 2021-12-10 LAB — COMPREHENSIVE METABOLIC PANEL
ALT: 42 U/L (ref 0–44)
AST: 53 U/L — ABNORMAL HIGH (ref 15–41)
Albumin: 4.2 g/dL (ref 3.5–5.0)
Alkaline Phosphatase: 101 U/L (ref 38–126)
Anion gap: 7 (ref 5–15)
BUN: 17 mg/dL (ref 6–20)
CO2: 27 mmol/L (ref 22–32)
Calcium: 9.4 mg/dL (ref 8.9–10.3)
Chloride: 103 mmol/L (ref 98–111)
Creatinine, Ser: 0.86 mg/dL (ref 0.44–1.00)
GFR, Estimated: >60 mL/min (ref >60)
Glucose, Bld: 91 mg/dL (ref 70–99)
Potassium: 3.2 mmol/L — ABNORMAL LOW (ref 3.5–5.1)
Sodium: 137 mmol/L (ref 135–145)
Total Bilirubin: 0.4 mg/dL (ref 0.3–1.2)
Total Protein: 7 g/dL (ref 6.5–8.1)

## 2021-12-10 LAB — SODIUM
Sodium: 133 mmol/L — ABNORMAL LOW (ref 135–145)
Sodium: 139 mmol/L (ref 135–145)

## 2021-12-10 LAB — CORTISOL-AM, BLOOD: Cortisol - AM: 5.9 ug/dL — ABNORMAL LOW (ref 6.7–22.6)

## 2021-12-10 MED ORDER — GADOBUTROL 1 MMOL/ML IV SOLN
6.0000 mL | Freq: Once | INTRAVENOUS | Status: AC | PRN
  Administered 2021-12-10: 6 mL via INTRAVENOUS

## 2021-12-10 MED ORDER — CYCLOBENZAPRINE HCL 10 MG PO TABS
10.0000 mg | ORAL_TABLET | Freq: Three times a day (TID) | ORAL | Status: DC | PRN
Start: 2021-12-10 — End: 2021-12-11
  Administered 2021-12-10: 10 mg via ORAL
  Filled 2021-12-10: qty 1

## 2021-12-10 MED ORDER — POTASSIUM CHLORIDE CRYS ER 20 MEQ PO TBCR
40.0000 meq | EXTENDED_RELEASE_TABLET | Freq: Once | ORAL | Status: AC
  Administered 2021-12-10: 40 meq via ORAL
  Filled 2021-12-10: qty 2

## 2021-12-10 NOTE — Progress Notes (Signed)
PROGRESS NOTE    Shelby Colon  SLH:[id number] DOB: [birth year]  DOA: 12/08/2021 Date of Service: 12/10/21 PCP: Merryl Hacker, No     Brief Narrative / Hospital Course:  Patient is a 56 year old female with past medical history transaminitis, depression, chronic musculoskeletal pain, congenital single kidney, presents to ED with friend 12/08/2021 CC mental confusion x2 days.  Initially thought due to some alcohol though reportedly patient does not drink on a daily basis.  Friend was noticing worsening mental status and brought patient to ED. 07/11: Hemodynamically stable, no concerns on CBC except thrombocytosis, BMP showed significant hyponatremia as well as hypokalemia, hyperglycemia, transaminitis.  CT head no concerns.  She did have 2.9 x 2.3 cm arachnoid cyst overlying the right temporal lobe unchanged in size from prior brain MRI 02/15/2020, mild local mass effect upon the underlying brain parenchyma.  ICU was initially consulted but recommended defer hypertonic saline infusion.   Patient started on sodium chloride tablets and admitted to hospitalist service.  07/12: A.m. corrected fairly rapidly from 120 at 11:30 AM on 12/08/2021, was up to 131 at 04:30 on 12/09/2021 at which point sodium chloride tablets were DC'd.  Other than feeling tired) reported she was confused and not remembering things well.  Reports history of congenital singular kidney.  History of gastric bypass surgery 07/13: Sodium has normalized but repeat urine studies cannot rule out adrenal insufficiency, cortisol levels pending.  MRI brain ordered to reevaluate brain cyst.   Consultants:  none  Procedures: none    Subjective: Patient states she was feeling pretty good and energetic yesterday, just tired today and may be a bit more confused.  Husband, per RN, also notes the same.  She reports some mental health concerns as well, states that she has been dealing with depression/anxiety issues with regard to her mother who she was  caring for in [place], with regard to her disability as she used to work full-time and now is unable to do so.  Patient reports she had a work-up last year for dementia but this was negative.     ASSESSMENT & PLAN:   Principal Problem:   Hypo-osmolar hyponatremia Active Problems:   Thrombocytosis   Depression, recurrent (HCC)   Altered mental status   Hypokalemia   Transaminitis   Confusion   Brain cyst   Hypo-osmolar hyponatremia ?if single kidney, CT head findings, hx bariatric surgery are involved. May consider MRI brain but cerebral salt wasting is unlikely given urine Na not substantially high . Serum Osm low + Urine Osm 516 (WNL) + Urine Na 50 --> Will re-measure urine osm and Na. (If urine Na <40, Urine Osm <100 would support hypovolemic hyponatremia. If Urine Na >40 and Urine Osm >100 would need to evaluate for glucocorticoid deficiency, TSH) 07/13: U-Osm 559 and U-Na 12, TSH WNL --> cortisol pending Relatively rapid correction of sodium with minimal intervention with salt tablets, patient appearing a bit more confused per the husband but see below for assessment/plan regarding altered mental status, on my exam 12/10/2021 she is not acutely encephalopathic though she does appear to be in mild psychiatric distress due to anxiety/depression   Transaminitis Improving, trend a.m. CMP shows continuing to trend down Hypovolemia would explain low sodium and but also support diagnosis of mild shock liver/transaminitis due to watershed effect.  Thrombocytosis Elevated platelets on admission, most likely reactive  resolved on subsequent blood draw, monitor CBC.  Hypokalemia Replete as needed Follow-up BMP in a.m.  Brain cyst Stable on CT, given confusion getting  MRI to further evaluate though mass seems unlikely and her confusion could also be attributed to her other metabolic derangements, see above  Altered mental status Acute metabolic encephalopathy resolved with correction  of hyponatremia, was doing better mentally 12/09/2021 but 12/10/2021 feels more mentally slow and confused, emotionally labile. Cortisol levels pending for hyponatremia work-up May also be due to caffeine withdrawal, underlying psychiatric issues with admitted problems with anxiety/depression, medication effect (has required addition of Flexeril for her chronic back pain since hospital bed has been causing discomfort) MRI brain pending given history of brain cyst, though this seems unlikely to be causing her acute issues given the relatively rapid correction in sodium would still get this for completeness.     DVT prophylaxis: lovenox Code Status: FULL Family Communication: spoke w/ husband and 2 friends at bedside yesterday, will update husband later today Disposition Plan / TOC needs: Anticipate discharge back home when medically stable Barriers to discharge / significant pending items: Following hyponatremia work-up and ensure stabilization and confirm diagnosis of underlying etiology.  Hopefully will be stable for discharge 12/11/2021 pending MRI brain, cortisol levels             Objective: Vitals:   12/09/21 2324 12/10/21 0328 12/10/21 0734 12/10/21 1200  BP: (!) 142/81 137/81 121/71 131/77  Pulse: 72 70 84 92  Resp: '20 18 16 16  '$ Temp: 98.6 F (37 C) 98 F (36.7 C) 98 F (36.7 C) 98.3 F (36.8 C)  TempSrc:    Oral  SpO2: 97% 100% 100% 100%  Weight:      Height:        Intake/Output Summary (Last 24 hours) at 12/10/2021 1338 Last data filed at 12/10/2021 1311 Gross per 24 hour  Intake 480 ml  Output 1250 ml  Net -770 ml   Filed Weights   12/09/21 1206  Weight: 58.8 kg    Examination:  Constitutional:  VS as above General Appearance: alert, well-developed, well-nourished, NAD Eyes: Normal lids and conjunctive, non-icteric sclera Ears, Nose, Mouth, Throat: Normal appearance Neck: No masses, trachea midline Respiratory: Normal respiratory effort Breath  sounds normal, no wheeze/rhonchi/rales Cardiovascular: S1/S2 normal, no murmur/rub/gallop auscultated No lower extremity edema Gastrointestinal: Nontender, no masses No hernia appreciated Musculoskeletal:  No clubbing/cyanosis of digits Neurological: No cranial nerve deficit on limited exam Psychiatric: Normal judgment/insight Occasionally anxious/depressed mood and affect       Scheduled Medications:   DULoxetine  60 mg Oral Daily   enoxaparin (LOVENOX) injection  40 mg Subcutaneous Q24H   pregabalin  50 mg Oral BID   And   pregabalin  150 mg Oral QHS    Continuous Infusions:   PRN Medications:    Antimicrobials:  Anti-infectives (From admission, onward)    None       Data Reviewed: I have personally reviewed following labs and imaging studies  CBC: Recent Labs  Lab 12/08/21 1129 12/09/21 0433  WBC 10.2 7.9  HGB 12.2 12.0  HCT 36.5 36.7  MCV 81.8 83.4  PLT 415* 403   Basic Metabolic Panel: Recent Labs  Lab 12/08/21 1129 12/08/21 1525 12/09/21 0433 12/09/21 1258 12/09/21 1849 12/10/21 0040 12/10/21 0600  NA 120*   < > 131* 134* 134* 133* 137  K 3.4*  --  3.3*  --   --   --  3.2*  CL 85*  --  95*  --   --   --  103  CO2 22  --  28  --   --   --  27  GLUCOSE 152*  --  102*  --   --   --  91  BUN 10  --  8  --   --   --  17  CREATININE 0.69  --  0.74  --   --   --  0.86  CALCIUM 9.8  --  9.2  --   --   --  9.4  MG  --   --  1.9  --   --   --   --   PHOS  --   --  2.9  --   --   --   --    < > = values in this interval not displayed.   GFR: Estimated Creatinine Clearance: 59.1 mL/min (by C-G formula based on SCr of 0.86 mg/dL). Liver Function Tests: Recent Labs  Lab 12/08/21 1129 12/09/21 0433 12/10/21 0600  AST 101* 88* 53*  ALT 57* 50* 42  ALKPHOS 127* 109 101  BILITOT 1.6* 1.4* 0.4  PROT 7.9 7.2 7.0  ALBUMIN 5.0 4.2 4.2   No results for input(s): "LIPASE", "AMYLASE" in the last 168 hours. No results for input(s): "AMMONIA" in  the last 168 hours. Coagulation Profile: No results for input(s): "INR", "PROTIME" in the last 168 hours. Cardiac Enzymes: No results for input(s): "CKTOTAL", "CKMB", "CKMBINDEX", "TROPONINI" in the last 168 hours. BNP (last 3 results) No results for input(s): "PROBNP" in the last 8760 hours. HbA1C: No results for input(s): "HGBA1C" in the last 72 hours. CBG: No results for input(s): "GLUCAP" in the last 168 hours. Lipid Profile: No results for input(s): "CHOL", "HDL", "LDLCALC", "TRIG", "CHOLHDL", "LDLDIRECT" in the last 72 hours. Thyroid Function Tests: Recent Labs    12/08/21 1945  TSH 3.817   Anemia Panel: Recent Labs    12/09/21 0433  VITAMINB12 406   Urine analysis:    Component Value Date/Time   COLORURINE YELLOW (A) 11/21/2019 0907   APPEARANCEUR CLEAR (A) 11/21/2019 0907   APPEARANCEUR Clear 02/15/2019 1004   LABSPEC 1.013 11/21/2019 0907   PHURINE 6.0 11/21/2019 0907   GLUCOSEU NEGATIVE 11/21/2019 0907   HGBUR NEGATIVE 11/21/2019 0907   BILIRUBINUR NEGATIVE 11/21/2019 0907   BILIRUBINUR Negative 02/15/2019 1004   KETONESUR NEGATIVE 11/21/2019 0907   PROTEINUR NEGATIVE 11/21/2019 0907   NITRITE NEGATIVE 11/21/2019 0907   LEUKOCYTESUR NEGATIVE 11/21/2019 0907   Sepsis Labs: '@LABRCNTIP'$ (procalcitonin:4,lacticidven:4)  No results found for this or any previous visit (from the past 240 hour(s)).       Radiology Studies last 96 hours: CT HEAD WO CONTRAST (5MM)  Result Date: 12/08/2021 CLINICAL DATA:  Provided history: Altered mental status, nontraumatic. EXAM: CT HEAD WITHOUT CONTRAST TECHNIQUE: Contiguous axial images were obtained from the base of the skull through the vertex without intravenous contrast. RADIATION DOSE REDUCTION: This exam was performed according to the departmental dose-optimization program which includes automated exposure control, adjustment of the mA and/or kV according to patient size and/or use of iterative reconstruction technique.  COMPARISON:  Brain MRI 02/15/2020. FINDINGS: Brain: Cerebral volume is normal. 2.9 x 1.3 x 2.3 cm CSF density focus overlying the lateral right temporal lobe, compatible with an arachnoid cyst and unchanged in size from the prior brain MRI of 02/15/2020. As before, there is mild local mass effect upon the underlying brain parenchyma. There is no acute intracranial hemorrhage. No demarcated cortical infarct. No evidence of an intracranial mass. No midline shift. Vascular: No hyperdense vessel.  Atherosclerotic calcifications. Skull: No fracture or aggressive osseous lesion. Sinuses/Orbits:  No mass or acute finding within the imaged orbits. No significant paranasal sinus disease at the imaged levels. IMPRESSION: No evidence of acute intracranial abnormality. 2.9 x 2.3 cm arachnoid cyst overlying the lateral right temporal lobe, unchanged in size from the prior brain MRI of 02/15/2020. As before, there is mild local mass effect upon the underlying brain parenchyma. Electronically Signed   By: Kellie Simmering D.O.   On: 12/08/2021 12:10            LOS: 2 days     Emeterio Reeve, DO [facility] 12/10/2021, 1:38 PM   Staff may message me via secure chat in [place]  but this may not receive immediate response,  please page for urgent matters!  If 7PM-7AM, please contact night-coverage [email or web address]  Dictation software was used to generate the above note. Typos may occur and escape review, as with typed/written notes. Please contact Dr Sheppard Coil directly for clarity if needed.

## 2021-12-10 NOTE — Assessment & Plan Note (Signed)
Acute metabolic encephalopathy resolved with correction of hyponatremia, was doing better mentally 12/09/2021 but 12/10/2021 feels more mentally slow and confused, emotionally labile.  Cortisol levels pending for hyponatremia work-up  May also be due to caffeine withdrawal, underlying psychiatric issues with admitted problems with anxiety/depression, medication effect (has required addition of Flexeril for her chronic back pain since hospital bed has been causing discomfort)  MRI brain pending given history of brain cyst, though this seems unlikely to be causing her acute issues given the relatively rapid correction in sodium would still get this for completeness.

## 2021-12-10 NOTE — Assessment & Plan Note (Addendum)
   Stable on CT, given confusion getting MRI to further evaluate though mass seems unlikely and her confusion could also be attributed to her other metabolic derangements, see above

## 2021-12-11 DIAGNOSIS — E871 Hypo-osmolality and hyponatremia: Secondary | ICD-10-CM | POA: Diagnosis not present

## 2021-12-11 LAB — CBC
HCT: 38.2 % (ref 36.0–46.0)
Hemoglobin: 12 g/dL (ref 12.0–15.0)
MCH: 27.3 pg (ref 26.0–34.0)
MCHC: 31.4 g/dL (ref 30.0–36.0)
MCV: 87 fL (ref 80.0–100.0)
Platelets: 367 10*3/uL (ref 150–400)
RBC: 4.39 MIL/uL (ref 3.87–5.11)
RDW: 13.4 % (ref 11.5–15.5)
WBC: 8.9 10*3/uL (ref 4.0–10.5)
nRBC: 0 % (ref 0.0–0.2)

## 2021-12-11 LAB — COMPREHENSIVE METABOLIC PANEL
ALT: 33 U/L (ref 0–44)
AST: 34 U/L (ref 15–41)
Albumin: 3.8 g/dL (ref 3.5–5.0)
Alkaline Phosphatase: 87 U/L (ref 38–126)
Anion gap: 4 — ABNORMAL LOW (ref 5–15)
BUN: 18 mg/dL (ref 6–20)
CO2: 27 mmol/L (ref 22–32)
Calcium: 9 mg/dL (ref 8.9–10.3)
Chloride: 106 mmol/L (ref 98–111)
Creatinine, Ser: 0.79 mg/dL (ref 0.44–1.00)
GFR, Estimated: >60 mL/min (ref >60)
Glucose, Bld: 86 mg/dL (ref 70–99)
Potassium: 3.8 mmol/L (ref 3.5–5.1)
Sodium: 137 mmol/L (ref 135–145)
Total Bilirubin: 0.5 mg/dL (ref 0.3–1.2)
Total Protein: 6.5 g/dL (ref 6.5–8.1)

## 2021-12-11 NOTE — Progress Notes (Signed)
Patient reports confusion as to her medication regimen while inpatient. Current medications and dosages explained at this time. Patient agreeable to hold off on 12am vitals as she was left more confused by lack of sleep yesterday morning in addition to added morning dose of Lyrica.   Plan: Pt given increased dose of '150mg'$  Lyrica per MD orders. Pt vitals taken at 2000 and 0400 to allow for full rest with rounding Q2HR. PT additionally requests that PRN Flexeril only be given at night if necessary.

## 2021-12-11 NOTE — Discharge Summary (Signed)
Physician Discharge Summary   Patient: Shelby Colon MRN: [id number]  DOB: [birth year]   Admit:     Date of Admission: 12/08/2021 Admitted from: home   Discharge: Date of discharge: 12/11/21 Disposition: Home Condition at discharge: good  CODE STATUS: FULL     Discharge Physician: Emeterio Reeve, DO [facility]     PCP: Pcp, No  Recommendations for Outpatient Follow-up:  Follow up with PCP Pcp, No (pt plans to establish w/ her husband's primary office) in 1-2 weeks Please obtain labs/tests: CBC, CMP (sodium, hepatic profile, potassium), consider AM Cortisol +/- ACTH levels to confirm low Cortisol levels  Please follow up on the following pending results: none Please arrange endocrinology follow up if repeat Cortisol is again low especially if sodium is also trending down Patient may also benefit from referral to behavioral health    Discharge Instructions     Diet general   Complete by: As directed    Discharge instructions   Complete by: As directed    Follow w/ PCP in the next 1-2 weeks. They may recheck labs and decide about referral to an endocrinologist to work up the low cortisol if repeat levels are still low. They will also recheck your sodium. In the meantime, eat a normal balanced diet, avoid excessive caffeine / energy drinks. Stay hydrated (drink when you're thirsty, urine should be light yellow not totally clear and not super dark). Take medications as usual.   Increase activity slowly   Complete by: As directed         Hospital Course:  Patient is a 56 year old female with past medical history transaminitis, depression, chronic musculoskeletal pain, congenital single kidney, presents to ED with friend 12/08/2021 CC mental confusion x2 days.  Initially thought due to some alcohol though reportedly patient does not drink on a daily basis.  Friend was noticing worsening mental status and brought patient to ED. 07/11: Hemodynamically stable, no  concerns on CBC except thrombocytosis, BMP showed significant hyponatremia as well as hypokalemia, hyperglycemia, transaminitis.  CT head no concerns.  She did have 2.9 x 2.3 cm arachnoid cyst overlying the right temporal lobe unchanged in size from prior brain MRI 02/15/2020, mild local mass effect upon the underlying brain parenchyma.  ICU was initially consulted but recommended defer hypertonic saline infusion.   Patient started on sodium chloride tablets and admitted to hospitalist service.  07/12: A.m. corrected fairly rapidly from 120 at 11:30 AM on 12/08/2021, was up to 131 at 04:30 on 12/09/2021 at which point sodium chloride tablets were DC'd.  Other than feeling tired) reported she was confused and not remembering things well.  Reports history of congenital singular kidney.  History of gastric bypass surgery 07/13: Sodium has normalized but repeat urine studies cannot rule out adrenal insufficiency, cortisol levels pending.  MRI brain ordered to reevaluate brain cyst --> see results below, cyst stable, few minor abn c/w migraine or early microvascular change, demyelinating disease seems less likely 07/14: cortisol slightly below normal at 5.9. Sodium remains WNL without intervention other than dietary (limit caffeine, normal hydration, regular emals since she only eats 1 - 2 meals per day at home.)      Consultants:  none   Procedures: none    Discharge Diagnoses: Principal Problem:   Hypo-osmolar hyponatremia Active Problems:   Thrombocytosis   Depression, recurrent (HCC)   Altered mental status   Hypokalemia   Transaminitis   Confusion   Brain cyst    Assessment & Plan:  Hypo-osmolar hyponatremia RESOLVED after sodium tab administration few days ago and no additional intervention ?if single kidney, CT head findings, hx bariatric surgery are involved. Suspect more likely chronic hypovolemia related to diet/hydration (excessive energy drinks at home w/ limited dietary intake  1-2 meals per day)  Serum Osm low + Urine Osm 516 (WNL) + Urine Na 50 --> Will re-measure urine osm and Na. (If urine Na <40, Urine Osm <100 would support hypovolemic hyponatremia. If Urine Na >40 and Urine Osm >100 would need to evaluate for glucocorticoid deficiency, TSH) 07/13: U-Osm 559 and U-Na 12, TSH WNL --> cortisol slightly below normal limits at 5.9 (normal from our lab is 6.7-22) Relatively rapid correction of sodium with minimal intervention with salt tablets, patient appearing a bit more confused per the husband but see below for assessment/plan regarding altered mental status, on my exam 12/10/2021 she is not acutely encephalopathic though she does appear to be in mild psychiatric distress due to anxiety/depression. Improved on 12/11/21.   Transaminitis RESOLVED Improving, trend a.m. CMP shows continuing to trend down Hypovolemia would explain low sodium and but also support diagnosis of mild shock liver/transaminitis due to watershed effect.  Thrombocytosis RESOLVED Elevated platelets on admission, most likely reactive  resolved on subsequent blood draw, monitor CBC.  Hypokalemia RESOLVED Replete as needed Follow-up BMP in a.m.  Brain cyst STABLE Stable on CT, given confusion getting MRI to further evaluate though mass seems unlikely and her confusion could also be attributed to her other metabolic derangements, see above --> MRI no acute concerns, stable cyst, minor abnormalities see below for full report. Consider outpatient neuro follow-up  Altered mental status RESOVLED Acute metabolic encephalopathy resolved with correction of hyponatremia, was doing better mentally 12/09/2021 but 12/10/2021 feels more mentally slow and confused, emotionally labile. Cortisol levels pending for hyponatremia work-up May also be due to caffeine withdrawal, underlying psychiatric issues with admitted problems with anxiety/depression, medication effect (has required addition of Flexeril for  her chronic back pain since hospital bed has been causing discomfort) MRI brain pending given history of brain cyst, though this seems unlikely to be causing her acute issues given the relatively rapid correction in sodium would still get this for completeness.      Discharge Instructions  Allergies as of 12/11/2021       Reactions   Morphine Nausea And Vomiting        Medication List     STOP taking these medications    cyclobenzaprine 5 MG tablet Commonly known as: FLEXERIL   diclofenac Sodium 1 % Gel Commonly known as: VOLTAREN   HAIR/SKIN/NAILS PO   hydroxychloroquine 200 MG tablet Commonly known as: PLAQUENIL   methocarbamol 750 MG tablet Commonly known as: ROBAXIN   NON FORMULARY   OVER THE COUNTER MEDICATION       TAKE these medications    DULoxetine 60 MG capsule Commonly known as: CYMBALTA Take 60 mg by mouth daily. What changed: Another medication with the same name was removed. Continue taking this medication, and follow the directions you see here.   pregabalin 50 MG capsule Commonly known as: LYRICA Take 50 mg by mouth 3 (three) times daily.   pregabalin 100 MG capsule Commonly known as: LYRICA Take 100 mg by mouth daily.   Premarin vaginal cream Generic drug: conjugated estrogens Place 1 Applicatorful vaginally 2 (two) times a week.          Allergies  Allergen Reactions   Morphine Nausea And Vomiting  Subjective: She feels good this morning, is in good spirits, no confusion, All questions answered and she and husband have no concerns at this time - ok for discharge home    Discharge Exam: Vitals:   12/10/21 1950 12/11/21 0417  BP: 112/79 115/69  Pulse: 100 69  Resp: 20   Temp: 99.1 F (37.3 C) 97.9 F (36.6 C)  SpO2: 100%   General: Pt is alert, awake, not in acute distress Cardiovascular: RRR, S1/S2 +, no rubs, no gallops Respiratory: CTA bilaterally, no wheezing, no rhonchi Abdominal: Soft, NT, ND, bowel  sounds + Extremities: no edema, no cyanosis     The results of significant diagnostics from this hospitalization (including imaging, microbiology, ancillary and laboratory) are listed below for reference.     Microbiology: No results found for this or any previous visit (from the past 240 hour(s)).   Labs: BNP (last 3 results) No results for input(s): "BNP" in the last 8760 hours. Basic Metabolic Panel: Recent Labs  Lab 12/08/21 1129 12/08/21 1525 12/09/21 0433 12/09/21 1258 12/09/21 1849 12/10/21 0040 12/10/21 0600 12/10/21 1308 12/11/21 0553  NA 120*   < > 131*   < > 134* 133* 137 139 137  K 3.4*  --  3.3*  --   --   --  3.2*  --  3.8  CL 85*  --  95*  --   --   --  103  --  106  CO2 22  --  28  --   --   --  27  --  27  GLUCOSE 152*  --  102*  --   --   --  91  --  86  BUN 10  --  8  --   --   --  17  --  18  CREATININE 0.69  --  0.74  --   --   --  0.86  --  0.79  CALCIUM 9.8  --  9.2  --   --   --  9.4  --  9.0  MG  --   --  1.9  --   --   --   --   --   --   PHOS  --   --  2.9  --   --   --   --   --   --    < > = values in this interval not displayed.   Liver Function Tests: Recent Labs  Lab 12/08/21 1129 12/09/21 0433 12/10/21 0600 12/11/21 0553  AST 101* 88* 53* 34  ALT 57* 50* 42 33  ALKPHOS 127* 109 101 87  BILITOT 1.6* 1.4* 0.4 0.5  PROT 7.9 7.2 7.0 6.5  ALBUMIN 5.0 4.2 4.2 3.8   No results for input(s): "LIPASE", "AMYLASE" in the last 168 hours. No results for input(s): "AMMONIA" in the last 168 hours. CBC: Recent Labs  Lab 12/08/21 1129 12/09/21 0433 12/11/21 0553  WBC 10.2 7.9 8.9  HGB 12.2 12.0 12.0  HCT 36.5 36.7 38.2  MCV 81.8 83.4 87.0  PLT 415* 363 367   Cardiac Enzymes: No results for input(s): "CKTOTAL", "CKMB", "CKMBINDEX", "TROPONINI" in the last 168 hours. BNP: Invalid input(s): "POCBNP" CBG: No results for input(s): "GLUCAP" in the last 168 hours. D-Dimer No results for input(s): "DDIMER" in the last 72 hours. Hgb  A1c No results for input(s): "HGBA1C" in the last 72 hours. Lipid Profile No results for input(s): "CHOL", "HDL", "LDLCALC", "TRIG", "CHOLHDL", "LDLDIRECT" in  the last 72 hours. Thyroid function studies Recent Labs    12/08/21 1945  TSH 3.817   Anemia work up Recent Labs    12/09/21 0433  VITAMINB12 406   Urinalysis    Component Value Date/Time   COLORURINE YELLOW (A) 11/21/2019 0907   APPEARANCEUR CLEAR (A) 11/21/2019 0907   APPEARANCEUR Clear 02/15/2019 1004   LABSPEC 1.013 11/21/2019 0907   PHURINE 6.0 11/21/2019 0907   GLUCOSEU NEGATIVE 11/21/2019 0907   HGBUR NEGATIVE 11/21/2019 0907   BILIRUBINUR NEGATIVE 11/21/2019 0907   BILIRUBINUR Negative 02/15/2019 1004   KETONESUR NEGATIVE 11/21/2019 0907   PROTEINUR NEGATIVE 11/21/2019 0907   NITRITE NEGATIVE 11/21/2019 0907   LEUKOCYTESUR NEGATIVE 11/21/2019 0907   Sepsis Labs Recent Labs  Lab 12/08/21 1129 12/09/21 0433 12/11/21 0553  WBC 10.2 7.9 8.9   Microbiology No results found for this or any previous visit (from the past 240 hour(s)). Imaging CT HEAD WO CONTRAST (5MM)  Result Date: 12/08/2021 CLINICAL DATA:  Provided history: Altered mental status, nontraumatic. EXAM: CT HEAD WITHOUT CONTRAST TECHNIQUE: Contiguous axial images were obtained from the base of the skull through the vertex without intravenous contrast. RADIATION DOSE REDUCTION: This exam was performed according to the departmental dose-optimization program which includes automated exposure control, adjustment of the mA and/or kV according to patient size and/or use of iterative reconstruction technique. COMPARISON:  Brain MRI 02/15/2020. FINDINGS: Brain: Cerebral volume is normal. 2.9 x 1.3 x 2.3 cm CSF density focus overlying the lateral right temporal lobe, compatible with an arachnoid cyst and unchanged in size from the prior brain MRI of 02/15/2020. As before, there is mild local mass effect upon the underlying brain parenchyma. There is no acute  intracranial hemorrhage. No demarcated cortical infarct. No evidence of an intracranial mass. No midline shift. Vascular: No hyperdense vessel.  Atherosclerotic calcifications. Skull: No fracture or aggressive osseous lesion. Sinuses/Orbits: No mass or acute finding within the imaged orbits. No significant paranasal sinus disease at the imaged levels. IMPRESSION: No evidence of acute intracranial abnormality. 2.9 x 2.3 cm arachnoid cyst overlying the lateral right temporal lobe, unchanged in size from the prior brain MRI of 02/15/2020. As before, there is mild local mass effect upon the underlying brain parenchyma. Electronically Signed   By: Kellie Simmering D.O.   On: 12/08/2021 12:10      Time coordinating discharge: Over 30 minutes  SIGNED:  Emeterio Reeve DO [facility]

## 2021-12-14 ENCOUNTER — Encounter: Payer: Self-pay | Admitting: Oncology

## 2021-12-18 ENCOUNTER — Encounter: Payer: Self-pay | Admitting: Internal Medicine

## 2021-12-18 ENCOUNTER — Encounter: Payer: Self-pay | Admitting: Oncology

## 2021-12-18 ENCOUNTER — Ambulatory Visit (INDEPENDENT_AMBULATORY_CARE_PROVIDER_SITE_OTHER): Payer: 59 | Admitting: Internal Medicine

## 2021-12-18 VITALS — BP 140/70 | HR 76 | Temp 98.5°F | Ht 62.0 in | Wt 137.2 lb

## 2021-12-18 DIAGNOSIS — G8929 Other chronic pain: Secondary | ICD-10-CM

## 2021-12-18 DIAGNOSIS — N3000 Acute cystitis without hematuria: Secondary | ICD-10-CM | POA: Diagnosis not present

## 2021-12-18 DIAGNOSIS — G930 Cerebral cysts: Secondary | ICD-10-CM

## 2021-12-18 DIAGNOSIS — E871 Hypo-osmolality and hyponatremia: Secondary | ICD-10-CM | POA: Diagnosis not present

## 2021-12-18 DIAGNOSIS — Z1231 Encounter for screening mammogram for malignant neoplasm of breast: Secondary | ICD-10-CM

## 2021-12-18 DIAGNOSIS — M545 Low back pain, unspecified: Secondary | ICD-10-CM

## 2021-12-18 DIAGNOSIS — R5383 Other fatigue: Secondary | ICD-10-CM

## 2021-12-18 DIAGNOSIS — E876 Hypokalemia: Secondary | ICD-10-CM | POA: Diagnosis not present

## 2021-12-18 DIAGNOSIS — R10A2 Flank pain, left side: Secondary | ICD-10-CM

## 2021-12-18 DIAGNOSIS — R7989 Other specified abnormal findings of blood chemistry: Secondary | ICD-10-CM | POA: Diagnosis not present

## 2021-12-18 DIAGNOSIS — R109 Unspecified abdominal pain: Secondary | ICD-10-CM | POA: Diagnosis not present

## 2021-12-18 DIAGNOSIS — R413 Other amnesia: Secondary | ICD-10-CM | POA: Diagnosis not present

## 2021-12-18 DIAGNOSIS — R012 Other cardiac sounds: Secondary | ICD-10-CM

## 2021-12-18 MED ORDER — PREGABALIN 50 MG PO CAPS: ORAL_CAPSULE | ORAL | 0 refills | Status: DC

## 2021-12-18 MED ORDER — PREGABALIN 100 MG PO CAPS: 100.0000 mg | ORAL_CAPSULE | Freq: Every day | ORAL | 0 refills | Status: DC

## 2021-12-18 NOTE — Progress Notes (Unsigned)
Chief Complaint  Patient presents with   [facility] f/u was in hospital for 4 days her Na was low '@117'$  and possible ref to endo   HPI ROS Past Medical History:  Diagnosis Date   Abnormal Pap smear of cervix    h/o LSIL pap with HPV +; 01/2019 pap negative negative HPV   Anxiety    Atrial fibrillation (HCC)    B12 deficiency    Chronic pain syndrome    Colon polyps    Congenital absence of right kidney    Degeneration of lumbar intervertebral disc    Depression    Early menopause    Gastric bypass status for obesity    2013, weight 258lb   Headache    Heart murmur    MVP   History of chicken pox    Hypercholesterolemia    Hypertension    Iron deficiency    Iron deficiency anemia 03/02/2019   Neuropathy    Rheumatoid arthritis ([place])    as child    Scoliosis of lumbar spine    Solitary kidney, congenital    pt thinks has left kidney    Thyroid disease    in the past    UTI (urinary tract infection)    Vitamin D deficiency    Past Surgical History:  Procedure Laterality Date   ANTERIOR CERVICAL DECOMPRESSION/DISCECTOMY FUSION 4 LEVELS N/A 11/28/2019   Procedure: ANTERIOR CERVICAL DECOMPRESSION/DISCECTOMY FUSION 4 LEVELS C3-7;  Surgeon: Meade Maw, MD;  Location: [facility] ORS;  Service: Neurosurgery;  Laterality: N/A;   AUGMENTATION MAMMAPLASTY Bilateral 2009   BACK SURGERY  11/04/2014   fusion of spine of lumbosacral region 2 rods and 16 screws    BREAST ENHANCEMENT SURGERY     COLONOSCOPY WITH PROPOFOL N/A 01/06/2018   Procedure: COLONOSCOPY WITH PROPOFOL;  Surgeon: Virgel Manifold, MD;  Location: [facility] ENDOSCOPY;  Service: Endoscopy;  Laterality: N/A;   COLONOSCOPY WITH PROPOFOL N/A 03/19/2019   Procedure: COLONOSCOPY WITH PROPOFOL;  Surgeon: Virgel Manifold, MD;  Location: [facility] ENDOSCOPY;  Service: Endoscopy;  Laterality: N/A;   excess skin removal     KNEE ARTHROSCOPY Bilateral    x 7 knees b/l (2 surgeries on right knee) total 1 bone graft  and 5 arthroscopy total knee    ROUX-EN-Y PROCEDURE     TOTAL KNEE ARTHROPLASTY     left 09/21/18 [name] [name]   TOTAL KNEE ARTHROPLASTY     right knee Dr. Lavonia Drafts    Family History  Adopted: Yes  Problem Relation Age of Onset   Arthritis Mother    Drug abuse Mother        heriod OD   Early death Mother    Diabetes Father    Hypertension Father    Depression Father    Arthritis Father    Drug abuse Father    Diabetes Sister    Fibromyalgia Sister    Rashes / Skin problems Sister        PSORIASIS   Diabetes Brother    Osteoarthritis Maternal Grandmother    Aneurysm Maternal Aunt    Thyroid disease Maternal Aunt    Fibromyalgia Maternal Aunt    Breast cancer Maternal Aunt    Cancer Maternal Aunt        breast cancer    Social History   Socioeconomic History   Marital status: Married    Spouse name: Not on file   Number of children: Not on file  Years of education: Not on file   Highest education level: Not on file  Occupational History   Not on file  Tobacco Use   Smoking status: Never   Smokeless tobacco: Never  Vaping Use   Vaping Use: Never used  Substance and Sexual Activity   Alcohol use: Yes    Comment: socially   Drug use: No   Sexual activity: Yes  Other Topics Concern   Not on file  Social History Narrative   Married since 2012    No kids    From [place] moved to [facility] and now lives here    Currently unemployed used to be [place] unemployed since 04/2018    BA degree    No guns, wears seat belt, safe in relationship    Social Determinants of Radio broadcast assistant Strain: Not on file  Food Insecurity: Not on file  Transportation Needs: Not on file  Physical Activity: Not on file  Stress: Not on file  Social Connections: Not on file  Intimate Partner Violence: Not on file   Current Meds  Medication Sig   conjugated estrogens (PREMARIN) vaginal cream  Place 1 Applicatorful vaginally 2 (two) times a week.   DULoxetine (CYMBALTA) 60 MG capsule Take 60 mg by mouth daily.   pregabalin (LYRICA) 100 MG capsule Take 100 mg by mouth daily.   pregabalin (LYRICA) 50 MG capsule Take 50 mg by mouth 3 (three) times daily.   Allergies  Allergen Reactions   Morphine Nausea And Vomiting   Recent Results (from the past 2160 hour(s))  Comprehensive metabolic panel     Status: Abnormal   Collection Time: 12/08/21 11:29 AM  Result Value Ref Range   Sodium 120 (L) 135 - 145 mmol/L   Potassium 3.4 (L) 3.5 - 5.1 mmol/L   Chloride 85 (L) 98 - 111 mmol/L   CO2 22 22 - 32 mmol/L   Glucose, Bld 152 (H) 70 - 99 mg/dL    Comment: Glucose reference range applies only to samples taken after fasting for at least 8 hours.   BUN 10 6 - 20 mg/dL   Creatinine, Ser 0.69 0.44 - 1.00 mg/dL   Calcium 9.8 8.9 - 10.3 mg/dL   Total Protein 7.9 6.5 - 8.1 g/dL   Albumin 5.0 3.5 - 5.0 g/dL   AST 101 (H) 15 - 41 U/L   ALT 57 (H) 0 - 44 U/L   Alkaline Phosphatase 127 (H) 38 - 126 U/L   Total Bilirubin 1.6 (H) 0.3 - 1.2 mg/dL   GFR, Estimated >60 >60 mL/min    Comment: (NOTE) Calculated using the CKD-EPI Creatinine Equation (2021)    Anion gap 13 5 - 15    Comment: Performed at [facility], [address] [place], [place] [address]  CBC     Status: Abnormal   Collection Time: 12/08/21 11:29 AM  Result Value Ref Range   WBC 10.2 4.0 - 10.5 K/uL   RBC 4.46 3.87 - 5.11 MIL/uL   Hemoglobin 12.2 12.0 - 15.0 g/dL   HCT 36.5 36.0 - 46.0 %   MCV 81.8 80.0 - 100.0 fL   MCH 27.4 26.0 - 34.0 pg   MCHC 33.4 30.0 - 36.0 g/dL   RDW 12.5 11.5 - 15.5 %   Platelets 415 (H) 150 - 400 K/uL   nRBC 0.0 0.0 - 0.2 %    Comment: Performed at [facility], [address]  [place], [place] [address]  Osmolality, urine     Status: None   Collection Time: 12/08/21 11:29 AM  Result Value Ref Range   Osmolality, Ur 516 300 - 900 mOsm/kg    Comment: REPEATED TO  VERIFY Performed at [facility], [address] [place], [place] [address]   Sodium, urine, random     Status: None   Collection Time: 12/08/21 11:29 AM  Result Value Ref Range   Sodium, Ur 50 mmol/L    Comment: Performed at [facility], [address] [place], [place] [address]  Creatinine, urine, random     Status: None   Collection Time: 12/08/21 11:29 AM  Result Value Ref Range   Creatinine, Urine 135 mg/dL    Comment: Performed at [facility], [address] [place], [place] [address]  Sodium     Status: Abnormal   Collection Time: 12/08/21  3:25 PM  Result Value Ref Range   Sodium 121 (L) 135 - 145 mmol/L    Comment: Performed at [facility], [address] [place], [place] [address]  Osmolality     Status: Abnormal   Collection Time: 12/08/21  3:25 PM  Result Value Ref Range   Osmolality 253 (L) 275 - 295 mOsm/kg    Comment: REPEATED TO VERIFY Performed at [facility], [address] [place], [place] [address]   HIV Antibody (routine testing w rflx)     Status: None   Collection Time: 12/08/21  3:25 PM  Result Value Ref Range   HIV Screen 4th Generation wRfx Non Reactive Non Reactive    Comment: Performed at [facility], [place] [address], [place], [place] [address]  Sodium     Status: Abnormal   Collection Time: 12/08/21  7:45 PM  Result Value Ref Range   Sodium 122 (L) 135 - 145 mmol/L    Comment: Performed at [facility], [address] [place], [place] [address]  Ethanol     Status: None   Collection Time: 12/08/21  7:45 PM  Result Value Ref Range   Alcohol, Ethyl (B) <10 <10 mg/dL    Comment: (NOTE) Lowest detectable limit for serum alcohol is 10 mg/dL.  For medical purposes only. Performed at [facility], [address] [place], [place] [address]   TSH     Status: None   Collection Time: 12/08/21  7:45 PM  Result Value Ref Range   TSH 3.817 0.350 - 4.500 uIU/mL     Comment: Performed by a 3rd Generation assay with a functional sensitivity of <=0.01 uIU/mL. Performed at [facility], [address] [place], [place] [address]   Sodium     Status: Abnormal   Collection Time: 12/08/21 11:20 PM  Result Value Ref Range   Sodium 127 (L) 135 - 145 mmol/L    Comment: Performed at [facility], [address] [place], [place] [address]  Sodium     Status: Abnormal   Collection Time: 12/09/21  2:34 AM  Result Value Ref Range   Sodium 130 (L) 135 - 145 mmol/L    Comment: Performed at [facility], [address] [place],  [address]  Comprehensive metabolic panel     Status: Abnormal   Collection Time: 12/09/21  4:33 AM  Result Value Ref Range   Sodium 131 (L) 135 - 145 mmol/L   Potassium 3.3 (L) 3.5 - 5.1 mmol/L   Chloride 95 (L) 98 - 111 mmol/L  CO2 28 22 - 32 mmol/L   Glucose, Bld 102 (H) 70 - 99 mg/dL    Comment: Glucose reference range applies only to samples taken after fasting for at least 8 hours.   BUN 8 6 - 20 mg/dL   Creatinine, Ser 0.74 0.44 - 1.00 mg/dL   Calcium 9.2 8.9 - 10.3 mg/dL   Total Protein 7.2 6.5 - 8.1 g/dL   Albumin 4.2 3.5 - 5.0 g/dL   AST 88 (H) 15 - 41 U/L   ALT 50 (H) 0 - 44 U/L   Alkaline Phosphatase 109 38 - 126 U/L   Total Bilirubin 1.4 (H) 0.3 - 1.2 mg/dL   GFR, Estimated >60 >60 mL/min    Comment: (NOTE) Calculated using the CKD-EPI Creatinine Equation (2021)    Anion gap 8 5 - 15    Comment: Performed at [facility], [address] [place], [place] [address]  CBC     Status: None   Collection Time: 12/09/21  4:33 AM  Result Value Ref Range   WBC 7.9 4.0 - 10.5 K/uL   RBC 4.40 3.87 - 5.11 MIL/uL   Hemoglobin 12.0 12.0 - 15.0 g/dL   HCT 36.7 36.0 - 46.0 %   MCV 83.4 80.0 - 100.0 fL   MCH 27.3 26.0 - 34.0 pg   MCHC 32.7 30.0 - 36.0 g/dL   RDW 12.8 11.5 - 15.5 %   Platelets 363 150 - 400 K/uL   nRBC 0.0 0.0 - 0.2 %    Comment: Performed at [facility] [address] [place], [place] [address]  APTT     Status: None   Collection Time: 12/09/21  4:33 AM  Result Value Ref Range   aPTT 32 24 - 36 seconds    Comment: Performed at [facility], [address], [place], [place] [address]  Magnesium     Status: None   Collection Time: 12/09/21  4:33 AM  Result Value Ref Range   Magnesium 1.9 1.7 - 2.4 mg/dL    Comment: Performed at [facility], [address] [place], [place] [address]  Phosphorus     Status: None   Collection Time: 12/09/21  4:33 AM  Result Value Ref Range   Phosphorus 2.9 2.5 - 4.6 mg/dL    Comment: Performed at [facility], [address] [place], [place] [address]  Vitamin B12     Status: None   Collection Time: 12/09/21  4:33 AM  Result Value Ref Range   Vitamin B-12 406 180 - 914 pg/mL    Comment: (NOTE) This assay is not validated for testing neonatal or myeloproliferative syndrome specimens for Vitamin B12 levels. Performed at [facility], [place] [address], [place], [place] [address]   Sodium     Status: Abnormal   Collection Time: 12/09/21 12:58 PM  Result Value Ref Range   Sodium 134 (L) 135 - 145 mmol/L    Comment: Performed at [facility] [address] [place], [place] [address]  Sodium, urine, random     Status: None   Collection Time: 12/09/21  2:07 PM  Result Value Ref Range   Sodium, Ur 12 mmol/L    Comment: Performed at [facility], [address] [place], [place] [address]  Osmolality, urine     Status: None   Collection Time: 12/09/21  6:30 PM  Result Value Ref Range   Osmolality, Ur 559 300 - 900 mOsm/kg  Comment: REPEATED TO VERIFY Performed at [facility], [address] [place], [place] [address]   Sodium     Status: Abnormal   Collection Time: 12/09/21  6:49 PM  Result Value Ref Range   Sodium 134 (L) 135 - 145 mmol/L    Comment: Performed at [facility], [address] [place],  [place] [address]  Sodium     Status: Abnormal   Collection Time: 12/10/21 12:40 AM  Result Value Ref Range   Sodium 133 (L) 135 - 145 mmol/L    Comment: Performed at [facility], [address] [place], [place] [address]  Comprehensive metabolic panel     Status: Abnormal   Collection Time: 12/10/21  6:00 AM  Result Value Ref Range   Sodium 137 135 - 145 mmol/L   Potassium 3.2 (L) 3.5 - 5.1 mmol/L   Chloride 103 98 - 111 mmol/L   CO2 27 22 - 32 mmol/L   Glucose, Bld 91 70 - 99 mg/dL    Comment: Glucose reference range applies only to samples taken after fasting for at least 8 hours.   BUN 17 6 - 20 mg/dL   Creatinine, Ser 0.86 0.44 - 1.00 mg/dL   Calcium 9.4 8.9 - 10.3 mg/dL   Total Protein 7.0 6.5 - 8.1 g/dL   Albumin 4.2 3.5 - 5.0 g/dL   AST 53 (H) 15 - 41 U/L   ALT 42 0 - 44 U/L   Alkaline Phosphatase 101 38 - 126 U/L   Total Bilirubin 0.4 0.3 - 1.2 mg/dL   GFR, Estimated >60 >60 mL/min    Comment: (NOTE) Calculated using the CKD-EPI Creatinine Equation (2021)    Anion gap 7 5 - 15    Comment: Performed at [facility], [address] [place], [place] [address]  Sodium     Status: None   Collection Time: 12/10/21  1:08 PM  Result Value Ref Range   Sodium 139 135 - 145 mmol/L    Comment: Performed at [facility], [address] [place], [place] [address]  Cortisol-am, blood     Status: Abnormal   Collection Time: 12/10/21  1:08 PM  Result Value Ref Range   Cortisol - AM 5.9 (L) 6.7 - 22.6 ug/dL    Comment: Performed at [facility], [place] [address], [place] [address]  CBC     Status: None   Collection Time: 12/11/21  5:53 AM  Result Value Ref Range   WBC 8.9 4.0 - 10.5 K/uL   RBC 4.39 3.87 - 5.11 MIL/uL   Hemoglobin 12.0 12.0 - 15.0 g/dL   HCT 38.2 36.0 - 46.0 %   MCV 87.0 80.0 - 100.0 fL   MCH 27.3 26.0 - 34.0 pg   MCHC 31.4 30.0 - 36.0 g/dL   RDW 13.4 11.5 - 15.5 %   Platelets 367 150 - 400 K/uL   nRBC 0.0 0.0 - 0.2 %     Comment: Performed at [facility], [address] [place], [place] [address]  Comprehensive metabolic panel     Status: Abnormal   Collection Time: 12/11/21  5:53 AM  Result Value Ref Range   Sodium 137 135 - 145 mmol/L   Potassium 3.8 3.5 - 5.1 mmol/L   Chloride 106 98 - 111 mmol/L   CO2 27 22 - 32 mmol/L   Glucose, Bld 86 70 - 99 mg/dL    Comment: Glucose reference range applies only  to samples taken after fasting for at least 8 hours.   BUN 18 6 - 20 mg/dL   Creatinine, Ser 0.79 0.44 - 1.00 mg/dL   Calcium 9.0 8.9 - 10.3 mg/dL   Total Protein 6.5 6.5 - 8.1 g/dL   Albumin 3.8 3.5 - 5.0 g/dL   AST 34 15 - 41 U/L   ALT 33 0 - 44 U/L   Alkaline Phosphatase 87 38 - 126 U/L   Total Bilirubin 0.5 0.3 - 1.2 mg/dL   GFR, Estimated >60 >60 mL/min    Comment: (NOTE) Calculated using the CKD-EPI Creatinine Equation (2021)    Anion gap 4 (L) 5 - 15    Comment: Performed at [facility], [address] [place], [place] [address]   Objective  Body mass index is 25.09 kg/m. Wt Readings from Last 3 Encounters:  12/18/21 137 lb 3.2 oz (62.2 kg)  12/09/21 129 lb 9.6 oz (58.8 kg)  01/07/21 129 lb 14.4 oz (58.9 kg)   Temp Readings from Last 3 Encounters:  12/18/21 98.5 F (36.9 C) (Oral)  12/11/21 97.9 F (36.6 C)  01/07/21 98.1 F (36.7 C) (Tympanic)   BP Readings from Last 3 Encounters:  12/18/21 140/70  12/11/21 115/69  01/07/21 (!) 145/87   Pulse Readings from Last 3 Encounters:  12/18/21 76  12/11/21 69  01/07/21 82    Physical Exam  Assessment  Plan  No diagnosis found.  Flu shot given today  [place] 2/2 Consider twinrix vaccines x 3 doses and shingrix  Tdap had 02/01/17  LMP age 64 early menopause  cologuard +04/14/17 - Referred to [facility] colonoscopy had 01/06/18 tubular adenoma poor prep rec f/u in 6-12 months.   As of 03/19/19 repeat  colonoscopy with bx neg [facility] -No known FH colon cancer found birth moms family      Mammogram  referred for 3 D with implants had 01/27/21 negative  Ordered 01/27/22    dexa 02/10/17 osteopenia  needs to take calcium 600 mg qd and vitamin D3 4000 IU daily Ordered No need for dermatology now      Pap 02/12/19-neg neg hpv Dr. Georgianne Fick   H/o abnormal pap 03/21/17 HPV bx 04/08/17 HPV LSIL [facility] Comment  Comment: Material submitted:                                        Marland Kitchen PART A: CERVIX, 4:00, BIOPSY PART B: CERVIX, 10:00, BIOPSY PART C: ENDOCERVIX, CURETTINGS      . Comment  Comment: Clinician provided ICD-10: R87.810    . Comment  Comment:  Diagnosis: Part A: CERVIX, 4:00, BIOPSY: KOILOCYTOSIS CONSISTENT WITH HUMAN PAPILLOMAVIRUS (HPV) EFFECT. NO ENDOCERVICAL TISSUE. NEGATIVE FOR HIGH GRADE DYSPLASIA AND MALIGNANCY Part B: CERVIX, 10:00, BIOPSY: LOW-GRADE SQUAMOUS INTRAEPITHELIAL LESION (CIN 1). CHRONIC CERVICITIS. TRANSFORMATION ZONE NOT REPRESENTED. NEGATIVE FOR HIGH GRADE DYSPLASIA AND MALIGNANCY Part C: ENDOCERVIX, CURETTINGS: MINUTE FRAGMENTS OF BENIGN ENDOCERVICAL GLANDS, MUCUS, AND BLOOD. TRANSFORMATION ZONE NOT REPRESENTED. NEGATIVE FOR DYSPLASIA, VIRAL EFFECT, AND MALIGNANCY. COMMENT: The patients history of a positive hybrid capture DNA test for high-risk Human Papillomavirus serotypes is noted which warrants continued follow up. CER/04/08/2017      . Comment  Comment: Electronically signed:                                     .  Elwanda Brooklyn, MD, Pathologist    . Comment  Comment: Gross description:                                         . 3 Containers, formalin-filled, labeled with patient identification. Part A: CERVIX, 4:00, BIOPSY: Received in formalin is .3 X .3 X .1 cm in aggregate of mucinous material.  Tissue is submitted in toto in 1 cassette(s). Part B: CERVIX, 10:00, BIOPSY: Received in formalin is .2 X .2 X .1 cm in aggregate of mucinous and tan tissue fragments.  Tissue is submitted in toto in 1 cassette(s). Part C: ENDOCERVIX,  CURETTINGS: Received in formalin is .5 X .5 X .2 cm in aggregate of mucinous, tan and brown material.  Tissue is submitted in toto in 1 cassette(s). /SBB /SBB    . Comment  Comment: Pathologist provided ICD-10: B97.7, N72, N87.0    . Comment  Comment: CPT                                                        . [id number], [id number], [phone]    iron deficiency Thrombocytosis could be reactive f/u Dr. Tasia Catchings Iron deficiency anemia S/P gastric bypass could be 2/2 this surgery  [facility]c Gi and see if further w/u EGD needs to be done   Dermatitis of left ear canal - Plan: Fluocinolone Acetonide 0.01 % OIL     Depression, recurrent (HCC) Anxiety Insomnia, unspecified type PTSD (post-traumatic stress disorder) Fu therapy [facility]  F/u psych Dr. Tawni Levy    Cardiac murmur  -consider echo in the future    Specialists Dr. Tasia Catchings  [place] rheum Dr Birdie Sons Ortho Dr. Lavonia Drafts  [place] Dr. Cari Caraway  GI Dr. Earley Favor GI  [facility] therapy  Dr. Tawni Levy psychiatry  [place]   Provider: Dr. Olivia Mackie McLean-Scocuzza-Internal Medicine

## 2021-12-18 NOTE — Patient Instructions (Addendum)
[email or web address]  [facility] has psychiatry and therapy if needed  [facility] as given the info before for both  [facility] counseling and psychiatry [place]  [place] [address] [phone]    [facility] [place]  [address]  [place] [address]  [phone]  Hyponatremia Hyponatremia is when the amount of salt (sodium) in a person's blood is too low. When sodium levels are low, the cells absorb extra water, which causes them to swell. The swelling happens throughout the body, but it mostly affects the brain. What are the causes? This condition may be caused by: Certain medical conditions, such as: Heart, kidney, or liver problems. Thyroid problems. Adrenal gland problems. Metabolic conditions, such as Addison's disease or syndrome of inappropriate antidiuresis (SIAD). Excessive vomiting, diarrhea, or sweating. Certain medicines or illegal drugs. Fluids given through an IV. What increases the risk? You are more likely to develop this condition if you: Have certain medical conditions such as heart, kidney, or liver failure. Have a medical condition that causes frequent or excessive diarrhea. Participate in intense physical activities, such as marathon running. Take certain medicines that affect the sodium and fluid balance in the blood. Some of these medicine types include: Diuretics. NSAIDs, such as ibuprofen. Some opioid pain medicines. Some antidepressants. Some seizure prevention medicines. What are the signs or symptoms? Symptoms of this condition include: Headache. Nausea and vomiting. Being very tired (lethargic). Muscle weakness and cramping. Loss of appetite. Feeling weak or light-headed. Severe symptoms of this condition include: Confusion. Agitation. Having a rapid heart rate. Fainting. Seizures. Coma. How is this diagnosed? This condition is diagnosed based on: A physical  exam. Your medical history. Tests, including: Blood tests. Urine tests. How is this treated? Treatment for this condition depends on the cause. Treatment may include: Getting fluids through an IV that is inserted into one of your veins. Medicines to correct the sodium imbalance. If medicines are causing the condition, the medicines will need to be adjusted. Limiting your water or fluid intake to get the correct sodium balance, in certain cases. Monitoring in the hospital to closely watch your symptoms for improvement. Follow these instructions at home:  Take over-the-counter and prescription medicines only as told by your health care provider. Many medicines can make this condition worse. Talk with your health care provider about any medicines that you are currently taking. Do not drink alcohol. Keep all follow-up visits. This is important. Contact a health care provider if: You develop worsening nausea, fatigue, headache, confusion, or weakness. Your symptoms go away and then return. Get help right away if: You have a seizure. You faint. You have ongoing diarrhea or vomiting. Summary Hyponatremia is when the amount of salt (sodium) in your blood is too low. When sodium levels are low, your cells absorb extra water, which causes them to swell. The swelling happens throughout the body, but it mostly affects the brain. Treatment for this condition depends on the cause. It may include receiving IV fluids, taking or adjusting medicines, limiting fluid intake, and monitoring in the hospital. This information is not intended to replace advice given to you by your health care provider. Make sure you discuss any questions you have with your health care provider. Document Revised: 11/25/2020 Document Reviewed: 11/25/2020 Elsevier Patient Education  [place].

## 2021-12-19 LAB — CBC WITH DIFFERENTIAL/PLATELET
Basophils Absolute: 0.1 10*3/uL (ref 0.0–0.2)
Basos: 1 %
EOS (ABSOLUTE): 0.1 10*3/uL (ref 0.0–0.4)
Eos: 1 %
Hematocrit: 35.9 % (ref 34.0–46.6)
Hemoglobin: 11.1 g/dL (ref 11.1–15.9)
Immature Grans (Abs): 0 10*3/uL (ref 0.0–0.1)
Immature Granulocytes: 0 %
Lymphocytes Absolute: 2.7 10*3/uL (ref 0.7–3.1)
Lymphs: 37 %
MCH: 27.8 pg (ref 26.6–33.0)
MCHC: 30.9 g/dL — ABNORMAL LOW (ref 31.5–35.7)
MCV: 90 fL (ref 79–97)
Monocytes Absolute: 0.5 10*3/uL (ref 0.1–0.9)
Monocytes: 7 %
Neutrophils Absolute: 3.8 10*3/uL (ref 1.4–7.0)
Neutrophils: 54 %
Platelets: 423 10*3/uL (ref 150–450)
RBC: 4 x10E6/uL (ref 3.77–5.28)
RDW: 13 % (ref 11.7–15.4)
WBC: 7.3 10*3/uL (ref 3.4–10.8)

## 2021-12-19 LAB — URINALYSIS, ROUTINE W REFLEX MICROSCOPIC
Bilirubin, UA: NEGATIVE
Glucose, UA: NEGATIVE
Ketones, UA: NEGATIVE
Nitrite, UA: NEGATIVE
Protein,UA: NEGATIVE
RBC, UA: NEGATIVE
Specific Gravity, UA: 1.006 (ref 1.005–1.030)
Urobilinogen, Ur: 0.2 mg/dL (ref 0.2–1.0)
pH, UA: 6 (ref 5.0–7.5)

## 2021-12-19 LAB — COMPREHENSIVE METABOLIC PANEL
ALT: 25 IU/L (ref 0–32)
AST: 25 IU/L (ref 0–40)
Albumin/Globulin Ratio: 2 (ref 1.2–2.2)
Albumin: 4.3 g/dL (ref 3.8–4.9)
Alkaline Phosphatase: 98 IU/L (ref 44–121)
BUN/Creatinine Ratio: 13 (ref 9–23)
BUN: 9 mg/dL (ref 6–24)
Bilirubin Total: 0.2 mg/dL (ref 0.0–1.2)
CO2: 24 mmol/L (ref 20–29)
Calcium: 9.5 mg/dL (ref 8.7–10.2)
Chloride: 100 mmol/L (ref 96–106)
Creatinine, Ser: 0.72 mg/dL (ref 0.57–1.00)
Globulin, Total: 2.1 g/dL (ref 1.5–4.5)
Glucose: 75 mg/dL (ref 70–99)
Potassium: 3.8 mmol/L (ref 3.5–5.2)
Sodium: 137 mmol/L (ref 134–144)
Total Protein: 6.4 g/dL (ref 6.0–8.5)
eGFR: 99 mL/min/{1.73_m2} (ref >59)

## 2021-12-19 LAB — MICROSCOPIC EXAMINATION
Bacteria, UA: NONE SEEN
Casts: NONE SEEN /lpf
RBC, Urine: NONE SEEN /hpf (ref 0–2)

## 2021-12-19 LAB — MAGNESIUM: Magnesium: 1.9 mg/dL (ref 1.6–2.3)

## 2021-12-20 LAB — URINE CULTURE

## 2021-12-21 ENCOUNTER — Ambulatory Visit
Admission: RE | Admit: 2021-12-21 | Discharge: 2021-12-21 | Disposition: A | Discharge status: Home / Self Care | Payer: Medicare HMO | Source: Ambulatory Visit | Attending: Internal Medicine | Admitting: Internal Medicine

## 2021-12-21 ENCOUNTER — Ambulatory Visit
Admission: RE | Admit: 2021-12-21 | Discharge: 2021-12-21 | Disposition: A | Discharge status: Home / Self Care | Payer: Medicare HMO | Attending: Internal Medicine | Admitting: Internal Medicine

## 2021-12-21 ENCOUNTER — Other Ambulatory Visit: Payer: Self-pay

## 2021-12-21 DIAGNOSIS — M545 Low back pain, unspecified: Secondary | ICD-10-CM | POA: Diagnosis not present

## 2021-12-21 DIAGNOSIS — E871 Hypo-osmolality and hyponatremia: Secondary | ICD-10-CM | POA: Diagnosis not present

## 2021-12-21 DIAGNOSIS — G8929 Other chronic pain: Secondary | ICD-10-CM | POA: Insufficient documentation

## 2021-12-21 DIAGNOSIS — R079 Chest pain, unspecified: Secondary | ICD-10-CM | POA: Diagnosis not present

## 2021-12-21 NOTE — Telephone Encounter (Signed)
Refill request from Shelby Colon at [facility].

## 2021-12-22 ENCOUNTER — Encounter: Payer: Self-pay | Admitting: Internal Medicine

## 2021-12-23 ENCOUNTER — Encounter: Payer: Self-pay | Admitting: Internal Medicine

## 2021-12-23 ENCOUNTER — Ambulatory Visit (INDEPENDENT_AMBULATORY_CARE_PROVIDER_SITE_OTHER): Payer: 59

## 2021-12-23 ENCOUNTER — Ambulatory Visit (INDEPENDENT_AMBULATORY_CARE_PROVIDER_SITE_OTHER): Payer: 59 | Admitting: Internal Medicine

## 2021-12-23 VITALS — BP 128/70 | HR 83 | Temp 98.5°F | Ht 62.0 in | Wt 136.1 lb

## 2021-12-23 DIAGNOSIS — K8020 Calculus of gallbladder without cholecystitis without obstruction: Secondary | ICD-10-CM

## 2021-12-23 DIAGNOSIS — R0781 Pleurodynia: Secondary | ICD-10-CM

## 2021-12-23 DIAGNOSIS — R7989 Other specified abnormal findings of blood chemistry: Secondary | ICD-10-CM | POA: Diagnosis not present

## 2021-12-23 DIAGNOSIS — Z981 Arthrodesis status: Secondary | ICD-10-CM | POA: Diagnosis not present

## 2021-12-23 NOTE — Patient Instructions (Addendum)
Aspercream with lidocaine or voltaren gel or lidocaine pain patch  Tylenol   Cholelithiasis  Cholelithiasis is a disease in which gallstones form in the gallbladder. The gallbladder is an organ that stores bile. Bile is a fluid that helps to digest fats. Gallstones begin as small crystals and can slowly grow into stones. They may cause no symptoms until they block the gallbladder duct, or cystic duct, when the gallbladder tightens (contracts) after food is eaten. This can cause pain and is known as a gallbladder attack, or biliary colic. There are two main types of gallstones: Cholesterol stones. These are the most common type of gallstone. These stones are made of hardened cholesterol and are usually yellow-green in color. Cholesterol is a fat-like substance that is made in the liver. Pigment stones. These are dark in color and are made of a red-yellow substance, called bilirubin,that forms when hemoglobin from red blood cells breaks down. What are the causes? This condition may be caused by an imbalance in the different parts that make bile. This can happen if the bile: Has too much bilirubin. This can happen in certain blood diseases, such as sickle cell anemia. Has too much cholesterol. Does not have enough bile salts. These salts help the body absorb and digest fats. In some cases, this condition can also be caused by the gallbladder not emptying completely or often enough. This is common during pregnancy. What increases the risk? The following factors may make you more likely to develop this condition: Being female. Having multiple pregnancies. Health care providers sometimes advise removing diseased gallbladders before future pregnancies. Eating a diet that is heavy in fried foods, fat, and refined carbohydrates, such as white bread and white rice. Being obese. Being older than age 59. Using medicines that contain female hormones (estrogen) for a long time. Losing weight  quickly. Having a family history of gallstones. Having certain medical problems, such as: Diabetes mellitus. Cystic fibrosis. Crohn's disease. Cirrhosis or other long-term (chronic) liver disease. Certain blood diseases, such as sickle cell anemia or leukemia. What are the signs or symptoms? In many cases, having gallstones causes no symptoms. When you have gallstones but do not have symptoms, you have silent gallstones. If a gallstone blocks your bile duct, it can cause a gallbladder attack. The main symptom of a gallbladder attack is sudden pain in the upper right part of the abdomen. The pain: Usually comes at night or after eating. Can last for one hour or more. Can spread to your right shoulder, back, or chest. Can feel like indigestion. This is discomfort, burning, or fullness in your upper abdomen. If the bile duct is blocked for more than a few hours, it can cause an infection or inflammation of your gallbladder (cholecystitis), liver, or pancreas. This can cause: Nausea or vomiting. Bloating. Pain in your abdomen that lasts for 5 hours or longer. Tenderness in your upper abdomen, often in the upper right section and under your rib cage. Fever or chills. Skin or the white parts of your eyes turning yellow (jaundice). This usually happens when a stone has blocked bile from passing through the common bile duct. Dark urine or light-colored stools. How is this diagnosed? This condition may be diagnosed based on: A physical exam. Your medical history. Ultrasound. CT scan. MRI. You may also have other tests, including: Blood tests to check for signs of an infection or inflammation. Cholescintigraphy, or HIDA scan. This is a scan of your gallbladder and bile ducts (biliary system) using non-harmful radioactive  material and special cameras that can see the radioactive material. Endoscopic retrograde cholangiopancreatogram. This involves inserting a small tube with a camera on the end  (endoscope) through your mouth to look at bile ducts and check for blockages. How is this treated? Treatment for this condition depends on the severity of the condition. Silent gallstones do not need treatment. Treatment may be needed if a blockage causes a gallbladder attack or other symptoms. Treatment may include: Home care, if symptoms are not severe. During a simple gallbladder attack, stop eating and drinking for 12-24 hours (except for water and clear liquids). This helps to "cool down" your gallbladder. After 1 or 2 days, you can start to eat a diet of simple or clear foods, such as broths and crackers. You may also need medicines for pain or nausea or both. If you have cholecystitis and an infection, you will need antibiotics. A hospital stay, if needed for pain control or for cholecystitis with severe infection. Cholecystectomy, or surgery to remove your gallbladder. This is the most common treatment if all other treatments have not worked. Medicines to break up gallstones. These are most effective at treating small gallstones. Medicines may be used for up to 6-12 months. Endoscopic retrograde cholangiopancreatogram. A small basket can be attached to the endoscope and used to capture and remove gallstones, mainly those that are in the common bile duct. Follow these instructions at home: Medicines Take over-the-counter and prescription medicines only as told by your health care provider. If you were prescribed an antibiotic medicine, take it as told by your health care provider. Do not stop taking the antibiotic even if you start to feel better. Ask your health care provider if the medicine prescribed to you requires you to avoid driving or using machinery. Eating and drinking Drink enough fluid to keep your urine pale yellow. This is important during a gallbladder attack. Water and clear liquids are preferred. Follow a healthy diet. This includes: Reducing fatty foods, such as fried food  and foods high in cholesterol. Reducing refined carbohydrates, such as white bread and white rice. Eating more fiber. Aim for foods such as almonds, fruit, and beans. Alcohol use If you drink alcohol: Limit how much you use to: 0-1 drink a day for nonpregnant women. 0-2 drinks a day for men. Be aware of how much alcohol is in your drink. In the [place] one drink equals one 12 oz bottle of beer (355 mL), one 5 oz glass of wine (148 mL), or one 1 oz glass of hard liquor (44 mL). General instructions Do not use any products that contain nicotine or tobacco, such as cigarettes, e-cigarettes, and chewing tobacco. If you need help quitting, ask your health care provider. Maintain a healthy weight. Keep all follow-up visits as told by your health care provider. These may include consultations with a surgeon or specialist. This is important. Where to find more information [employer] Diabetes and Digestive [facility] Kidney Diseases: [email or web address] Contact a health care provider if: You think you have had a gallbladder attack. You have been diagnosed with silent gallstones and you develop pain in your abdomen or indigestion. You begin to have attacks more often. You have dark urine or light-colored stools. Get help right away if: You have pain from a gallbladder attack that lasts for more than 2 hours. You have pain in your abdomen that lasts for more than 5 hours or is getting worse. You have a fever or chills. You have nausea and vomiting that  do not go away. You develop jaundice. Summary Cholelithiasis is a disease in which gallstones form in the gallbladder. This condition may be caused by an imbalance in the different parts that make bile. This can happen if your bile has too much bilirubin or cholesterol, or does not have enough bile salts. Treatment for gallstones depends on the severity of the condition. Silent gallstones do not need treatment. If gallstones cause a gallbladder  attack or other symptoms, treatment usually involves not eating or drinking anything. Treatment may also include pain medicines and antibiotics, and it sometimes includes a hospital stay. Surgery to remove the gallbladder is common if all other treatments have not worked. This information is not intended to replace advice given to you by your health care provider. Make sure you discuss any questions you have with your health care provider. Document Revised: 04/09/2019 Document Reviewed: 04/09/2019 Elsevier Patient Education  [place].  Rib Fracture  A rib fracture is a break or crack in one of the bones of the ribs. The ribs are long, curved bones that wrap around your chest and attach to your spine and your breastbone. The ribs protect your heart, lungs, and other organs in the chest. A broken or cracked rib is often painful but is not usually serious. Most rib fractures heal on their own over time. However, rib fractures can be more serious if multiple ribs are broken or if broken ribs move out of place and push against other structures or organs. What are the causes? This condition is caused by: Repetitive movements with high force, such as pitching a baseball or having very bad coughing spells. A direct hit the chest, such as a sports injury, a car crash, or a fall. Cancer that has spread to the bones, which can weaken bones and cause them to break. What are the signs or symptoms? Symptoms of this condition include: Pain when you breathe in or cough. Pain when someone presses on the injured area. Feeling short of breath. How is this diagnosed? This condition is diagnosed with a physical exam and medical history. You may also have imaging tests, such as: Chest X-ray. CT scan. MRI. Bone scan. Chest ultrasound. How is this treated? Treatment for this condition depends on the severity of the fracture. Most rib fractures usually heal on their own in 1-3 months. Healing may take  longer if you have a cough or if you do activities that make the injury worse. While you heal, you may be given medicines to control the pain. You will also be taught deep breathing exercises. Severe injuries may require hospitalization or surgery. Follow these instructions at home: Managing pain, stiffness, and swelling If directed, put ice on the injured area. To do this: Put ice in a plastic bag. Place a towel between your skin and the bag. Leave the ice on for 20 minutes, 2-3 times a day. Remove the ice if your skin turns bright red. This is very important. If you cannot feel pain, heat, or cold, you have a greater risk of damage to the area. Take over-the-counter and prescription medicines only as told by your health care provider. Activity Avoid doing activities or movements that cause pain. Be careful during activities and avoid bumping the injured rib. Slowly increase your activity as told by your health care provider. General instructions Do deep breathing exercises as told by your health care provider. This helps prevent pneumonia, which is a common complication of a broken rib. Your health care  provider may instruct you to: Take deep breaths several times a day. Try to cough several times a day, holding a pillow against the injured area. Use a device called incentive spirometer to practice deep breathing several times a day. Drink enough fluid to keep your urine pale yellow. Do not wear a rib belt or binder. These restrict breathing, which can lead to pneumonia. Keep all follow-up visits. This is important. Contact a health care provider if: You have a fever. Get help right away if: You have difficulty breathing or you are short of breath. You develop a cough that does not stop, or you cough up thick or bloody sputum. You have nausea, vomiting, or pain in your abdomen. Your pain gets worse and medicine does not help. These symptoms may represent a serious problem that is an  emergency. Do not wait to see if the symptoms will go away. Get medical help right away. Call your local emergency services (911 in the [place] Do not drive yourself to the hospital. Summary A rib fracture is a break or crack in one of the bones of the ribs. A broken or cracked rib is often painful but is not usually serious. Most rib fractures heal on their own over time. Treatment for this condition depends on the severity of the fracture. Avoid doing any activities or movements that cause pain. This information is not intended to replace advice given to you by your health care provider. Make sure you discuss any questions you have with your health care provider. Document Revised: 09/07/2019 Document Reviewed: 09/07/2019 Elsevier Patient Education  [place].

## 2021-12-23 NOTE — Progress Notes (Signed)
Chief Complaint  Patient presents with   Rib Injury    Pt complains of right side rib pain among breathing and coughing she winces as she coughs due to the sharp pain she feels   Fu 1. Right rib pain Sunday went to stretch and heard a pop years ago h/o rib fracture on a cruise fell on the deck and had 3 rib fractures now today having pleuritic right sided rib/flank pain mild to moderate nothing tried to vacuum with left arm due to pain recently  H/o gallstones as well no fever, nausea able to eat ok     Review of Systems  Constitutional:  Negative for weight loss.  HENT:  Negative for hearing loss.   Eyes:  Negative for blurred vision.  Respiratory:  Negative for shortness of breath.   Cardiovascular:  Negative for chest pain.  Gastrointestinal:  Negative for abdominal pain and blood in stool.  Genitourinary:  Negative for dysuria.  Musculoskeletal:  Negative for falls and joint pain.  Skin:  Negative for rash.  Neurological:  Negative for headaches.  Psychiatric/Behavioral:  Negative for depression.    Past Medical History:  Diagnosis Date   Abnormal Pap smear of cervix    h/o LSIL pap with HPV +; 01/2019 pap negative negative HPV   Anxiety    Atrial fibrillation (HCC)    B12 deficiency    Chronic pain syndrome    Colon polyps    Congenital absence of right kidney    Degeneration of lumbar intervertebral disc    Depression    Early menopause    Gastric bypass status for obesity    2013, weight 258lb   Headache    Heart murmur    MVP   History of chicken pox    Hypercholesterolemia    Hypertension    Iron deficiency    Iron deficiency anemia 03/02/2019   Neuropathy    Rheumatoid arthritis ([place])    as child    Scoliosis of lumbar spine    Solitary kidney, congenital    pt thinks has left kidney only no right kidney   Thyroid disease    in the past    UTI (urinary tract infection)    Vitamin D deficiency    Past Surgical History:  Procedure Laterality Date    ANTERIOR CERVICAL DECOMPRESSION/DISCECTOMY FUSION 4 LEVELS N/A 11/28/2019   Procedure: ANTERIOR CERVICAL DECOMPRESSION/DISCECTOMY FUSION 4 LEVELS C3-7;  Surgeon: Meade Maw, MD;  Location: [facility] OR[facility];  Service: Neurosurgery;  Laterality: N/A;   AUGMENTATION MAMMAPLASTY Bilateral 2009   BACK SURGERY  11/04/2014   fusion of spine of lumbosacral region 2 rods and 16 screws    BREAST ENHANCEMENT SURGERY     COLONOSCOPY WITH PROPOFOL N/A 01/06/2018   Procedure: COLONOSCOPY WITH PROPOFOL;  Surgeon: Virgel Manifold, MD;  Location: [facility];  Service: Endoscopy;  Laterality: N/A;   COLONOSCOPY WITH PROPOFOL N/A 03/19/2019   Procedure: COLONOSCOPY WITH PROPOFOL;  Surgeon: Virgel Manifold, MD;  Location: [facility] END[facility];  Service: Endoscopy;  Laterality: N/A;   excess skin removal     KNEE ARTHROSCOPY Bilateral    x 7 knees b/l (2 surgeries on right knee) total 1 bone graft and 5 arthroscopy total knee    ROUX-EN-Y PROCEDURE     TOTAL KNEE ARTHROPLASTY     left 09/21/18 [name] [name]   TOTAL KNEE ARTHROPLASTY     right knee Dr. Lavonia Drafts    Family History  Adopted: Yes  Problem Relation Age of Onset   Arthritis Mother    Drug abuse Mother        heriod OD   Early death Mother    Diabetes Father    Hypertension Father    Depression Father    Arthritis Father    Drug abuse Father    Diabetes Sister    Fibromyalgia Sister    Rashes / Skin problems Sister        PSORIASIS   Diabetes Brother    Osteoarthritis Maternal Grandmother    Aneurysm Maternal Aunt    Thyroid disease Maternal Aunt    Fibromyalgia Maternal Aunt    Breast cancer Maternal Aunt    Cancer Maternal Aunt        breast cancer    Social History   Socioeconomic History   Marital status: Married    Spouse name: Not on file   Number of children: Not on file   Years of education: Not on file   Highest education level: Not on file  Occupational History   Not on file   Tobacco Use   Smoking status: Never   Smokeless tobacco: Never  Vaping Use   Vaping Use: Never used  Substance and Sexual Activity   Alcohol use: Yes    Comment: socially   Drug use: No   Sexual activity: Yes  Other Topics Concern   Not on file  Social History Narrative   Married since 2012    No kids    adopted   From [place] moved to [facility] and now lives here    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   DULoxetine (CYMBALTA) 60 MG capsule Take 60 mg by mouth daily.   pregabalin (LYRICA) 100 MG capsule Take 1 capsule (100 mg total) by mouth at bedtime. 50 mg bid, 150 mg qhs   pregabalin (LYRICA) 50 MG capsule 50 mg bid, 150 mg qhs   Allergies  Allergen Reactions   Morphine Nausea And Vomiting   Recent Results (from the past 2160 hour(s))  Comprehensive metabolic panel     Status: Abnormal   Collection Time: 12/08/21 11:29 AM  Result Value Ref Range   Sodium 120 (L) 135 - 145 mmol/L   Potassium 3.4 (L) 3.5 - 5.1 mmol/L   Chloride 85 (L) 98 - 111 mmol/L   CO2 22 22 - 32 mmol/L   Glucose, Bld 152 (H) 70 - 99 mg/dL    Comment: Glucose reference range applies only to samples taken after fasting for at least 8 hours.   BUN 10 6 - 20 mg/dL   Creatinine, Ser 7.77 0.44 - 1.00 mg/dL   Calcium 9.8 8.9 - 01.7 mg/dL   Total Protein 7.9 6.5 - 8.1 g/dL   Albumin 5.0 3.5 - 5.0 g/dL   AST 971 (H) 15 - 41 U/L   ALT 57 (H) 0 - 44 U/L   Alkaline  Phosphatase 127 (H) 38 - 126 U/L   Total Bilirubin 1.6 (H) 0.3 - 1.2 mg/dL   GFR, Estimated >60 >60 mL/min    Comment: (NOTE) Calculated using the CKD-EPI Creatinine Equation (2021)    Anion gap 13 5 - 15    Comment: Performed at [facility], [address] [place], [place] [address]  CBC     Status: Abnormal   Collection Time: 12/08/21 11:29 AM  Result Value Ref Range   WBC 10.2 4.0 - 10.5 K/uL   RBC 4.46 3.87 - 5.11 MIL/uL   Hemoglobin 12.2 12.0 - 15.0 g/dL   HCT 36.5 36.0 - 46.0 %   MCV 81.8 80.0 - 100.0 fL   MCH 27.4 26.0 - 34.0 pg   MCHC 33.4 30.0 - 36.0 g/dL   RDW 12.5 11.5 - 15.5 %   Platelets 415 (H) 150 - 400 K/uL   nRBC 0.0 0.0 - 0.2 %    Comment: Performed at [facility], [address] [place], [place] [address]  Osmolality, urine     Status: None   Collection Time: 12/08/21 11:29 AM  Result Value Ref Range   Osmolality, Ur 516 300 - 900 mOsm/kg    Comment: REPEATED TO VERIFY Performed at [facility], [address] [place], [place] [address]   Sodium, urine, random     Status: None   Collection Time: 12/08/21 11:29 AM  Result Value Ref Range   Sodium, Ur 50 mmol/L    Comment: Performed at [facility], [address] [place], [place] [address]  Creatinine, urine, random     Status: None   Collection Time: 12/08/21 11:29 AM  Result Value Ref Range   Creatinine, Urine 135 mg/dL    Comment: Performed at [facility], [address] [place], [place] [address]  Sodium     Status: Abnormal   Collection Time: 12/08/21  3:25 PM  Result Value Ref Range   Sodium 121 (L) 135 - 145 mmol/L    Comment: Performed at [facility], [address] [place], [place] [address]  Osmolality     Status: Abnormal   Collection Time: 12/08/21  3:25 PM  Result Value Ref Range   Osmolality 253 (L) 275 - 295 mOsm/kg    Comment: REPEATED TO VERIFY Performed at [facility], [address] [place],  [place] [address]   HIV Antibody (routine testing w rflx)     Status: None   Collection Time: 12/08/21  3:25 PM  Result Value Ref Range   HIV Screen 4th Generation wRfx Non Reactive Non Reactive    Comment: Performed at [facility], [place] [address] [place], [place] [address]  Sodium     Status: Abnormal   Collection Time: 12/08/21  7:45 PM  Result Value Ref Range   Sodium 122 (L) 135 - 145 mmol/L    Comment: Performed at [facility], [address] [place],  [address]  Ethanol     Status: None   Collection Time: 12/08/21  7:45 PM  Result Value Ref Range   Alcohol, Ethyl (B) <10 <10 mg/dL    Comment: (NOTE) Lowest detectable limit for serum alcohol is 10 mg/dL.  For medical purposes only. Performed at [facility], [address] [place],  [address]   TSH     Status: None   Collection Time: 12/08/21  7:45 PM  Result Value Ref Range   TSH 3.817 0.350 -  4.500 uIU/mL    Comment: Performed by a 3rd Generation assay with a functional sensitivity of <=0.01 uIU/mL. Performed at [facility], [address] [place], [place] [address]   Sodium     Status: Abnormal   Collection Time: 12/08/21 11:20 PM  Result Value Ref Range   Sodium 127 (L) 135 - 145 mmol/L    Comment: Performed at [facility], [address] [place], [place] [address]  Sodium     Status: Abnormal   Collection Time: 12/09/21  2:34 AM  Result Value Ref Range   Sodium 130 (L) 135 - 145 mmol/L    Comment: Performed at [facility], [address] [place], [place] [address]  Comprehensive metabolic panel     Status: Abnormal   Collection Time: 12/09/21  4:33 AM  Result Value Ref Range   Sodium 131 (L) 135 - 145 mmol/L   Potassium 3.3 (L) 3.5 - 5.1 mmol/L   Chloride 95 (L) 98 - 111 mmol/L   CO2 28 22 - 32 mmol/L   Glucose, Bld 102 (H) 70 - 99 mg/dL    Comment: Glucose reference range applies only to samples taken after fasting for at least 8 hours.    BUN 8 6 - 20 mg/dL   Creatinine, Ser 0.74 0.44 - 1.00 mg/dL   Calcium 9.2 8.9 - 10.3 mg/dL   Total Protein 7.2 6.5 - 8.1 g/dL   Albumin 4.2 3.5 - 5.0 g/dL   AST 88 (H) 15 - 41 U/L   ALT 50 (H) 0 - 44 U/L   Alkaline Phosphatase 109 38 - 126 U/L   Total Bilirubin 1.4 (H) 0.3 - 1.2 mg/dL   GFR, Estimated >60 >60 mL/min    Comment: (NOTE) Calculated using the CKD-EPI Creatinine Equation (2021)    Anion gap 8 5 - 15    Comment: Performed at [facility], [address] [place], [place] [address]  CBC     Status: None   Collection Time: 12/09/21  4:33 AM  Result Value Ref Range   WBC 7.9 4.0 - 10.5 K/uL   RBC 4.40 3.87 - 5.11 MIL/uL   Hemoglobin 12.0 12.0 - 15.0 g/dL   HCT 36.7 36.0 - 46.0 %   MCV 83.4 80.0 - 100.0 fL   MCH 27.3 26.0 - 34.0 pg   MCHC 32.7 30.0 - 36.0 g/dL   RDW 12.8 11.5 - 15.5 %   Platelets 363 150 - 400 K/uL   nRBC 0.0 0.0 - 0.2 %    Comment: Performed at [facility], [address] [place], [place] [address]  APTT     Status: None   Collection Time: 12/09/21  4:33 AM  Result Value Ref Range   aPTT 32 24 - 36 seconds    Comment: Performed at [facility], [address] [place], [place] [address]  Magnesium     Status: None   Collection Time: 12/09/21  4:33 AM  Result Value Ref Range   Magnesium 1.9 1.7 - 2.4 mg/dL    Comment: Performed at [facility], [address], [place], [place] [address]  Phosphorus     Status: None   Collection Time: 12/09/21  4:33 AM  Result Value Ref Range   Phosphorus 2.9 2.5 - 4.6 mg/dL    Comment: Performed at [facility], [address], [place], [place] [address]  Vitamin B12     Status: None   Collection Time: 12/09/21  4:33 AM  Result Value Ref Range   Vitamin B-12 406 180 - 914 pg/mL    Comment: (NOTE) This assay is not validated for testing neonatal or myeloproliferative syndrome specimens for Vitamin B12 levels. Performed at [facility], [address], [place], [place] [address]   Sodium     Status: Abnormal   Collection Time: 12/09/21 12:58 PM  Result Value Ref Range   Sodium 134 (L) 135 - 145 mmol/L    Comment: Performed at [facility], [address] [place], [place] [address]  Sodium, urine, random     Status: None   Collection Time: 12/09/21  2:07 PM  Result Value Ref Range   Sodium, Ur 12 mmol/L    Comment: Performed at [facility], [address] [place], [place] [address]  Osmolality, urine     Status: None   Collection Time: 12/09/21  6:30 PM  Result Value Ref Range   Osmolality, Ur 559 300 - 900 mOsm/kg    Comment: REPEATED TO VERIFY Performed at [facility], [address] [place], [place] [address]   Sodium     Status: Abnormal   Collection Time: 12/09/21  6:49 PM  Result Value Ref Range   Sodium 134 (L) 135 - 145 mmol/L    Comment: Performed at [facility], [address] [place], [place] [address]  Sodium     Status: Abnormal   Collection Time: 12/10/21 12:40 AM  Result Value Ref Range   Sodium 133 (L) 135 - 145 mmol/L    Comment: Performed at [facility], [address] [place], [place] [address]  Comprehensive metabolic panel     Status: Abnormal   Collection Time: 12/10/21  6:00 AM  Result Value Ref Range   Sodium 137 135 - 145 mmol/L   Potassium 3.2 (L) 3.5 - 5.1 mmol/L   Chloride 103 98 - 111 mmol/L   CO2 27 22 - 32 mmol/L   Glucose, Bld 91 70 - 99 mg/dL    Comment: Glucose reference range applies only to samples taken after fasting for at least 8 hours.   BUN 17 6 - 20 mg/dL   Creatinine, Ser 0.86 0.44 - 1.00 mg/dL   Calcium 9.4 8.9 - 10.3 mg/dL   Total Protein 7.0 6.5 - 8.1 g/dL   Albumin 4.2 3.5 - 5.0 g/dL   AST 53 (H) 15 - 41 U/L   ALT 42 0 - 44 U/L   Alkaline Phosphatase 101 38 - 126 U/L   Total Bilirubin 0.4 0.3 - 1.2 mg/dL   GFR, Estimated >60 >60 mL/min    Comment: (NOTE) Calculated using the CKD-EPI Creatinine Equation  (2021)    Anion gap 7 5 - 15    Comment: Performed at [facility], [address] [place], [place] [address]  Sodium     Status: None   Collection Time: 12/10/21  1:08 PM  Result Value Ref Range   Sodium 139 135 - 145 mmol/L    Comment: Performed at [facility], [address] [place], [place] [address]  Cortisol-am, blood     Status: Abnormal   Collection Time: 12/10/21  1:08 PM  Result Value Ref Range   Cortisol - AM 5.9 (L) 6.7 - 22.6 ug/dL    Comment: Performed at [facility], [place] [address] [place], [place] [address]  CBC     Status: None   Collection Time: 12/11/21  5:53  AM  Result Value Ref Range   WBC 8.9 4.0 - 10.5 K/uL   RBC 4.39 3.87 - 5.11 MIL/uL   Hemoglobin 12.0 12.0 - 15.0 g/dL   HCT 38.2 36.0 - 46.0 %   MCV 87.0 80.0 - 100.0 fL   MCH 27.3 26.0 - 34.0 pg   MCHC 31.4 30.0 - 36.0 g/dL   RDW 13.4 11.5 - 15.5 %   Platelets 367 150 - 400 K/uL   nRBC 0.0 0.0 - 0.2 %    Comment: Performed at [facility], [address] [place], [place] [address]  Comprehensive metabolic panel     Status: Abnormal   Collection Time: 12/11/21  5:53 AM  Result Value Ref Range   Sodium 137 135 - 145 mmol/L   Potassium 3.8 3.5 - 5.1 mmol/L   Chloride 106 98 - 111 mmol/L   CO2 27 22 - 32 mmol/L   Glucose, Bld 86 70 - 99 mg/dL    Comment: Glucose reference range applies only to samples taken after fasting for at least 8 hours.   BUN 18 6 - 20 mg/dL   Creatinine, Ser 0.79 0.44 - 1.00 mg/dL   Calcium 9.0 8.9 - 10.3 mg/dL   Total Protein 6.5 6.5 - 8.1 g/dL   Albumin 3.8 3.5 - 5.0 g/dL   AST 34 15 - 41 U/L   ALT 33 0 - 44 U/L   Alkaline Phosphatase 87 38 - 126 U/L   Total Bilirubin 0.5 0.3 - 1.2 mg/dL   GFR, Estimated >60 >60 mL/min    Comment: (NOTE) Calculated using the CKD-EPI Creatinine Equation (2021)    Anion gap 4 (L) 5 - 15    Comment: Performed at [facility], [address] [place], [place] [address]  Urinalysis,  Routine w reflex microscopic     Status: Abnormal   Collection Time: 12/18/21  2:25 PM  Result Value Ref Range   Specific Gravity, UA 1.006 1.005 - 1.030   pH, UA 6.0 5.0 - 7.5   Color, UA Yellow Yellow   Appearance Ur Clear Clear   Leukocytes,UA Trace (A) Negative   Protein,UA Negative Negative/Trace   Glucose, UA Negative Negative   Ketones, UA Negative Negative   RBC, UA Negative Negative   Bilirubin, UA Negative Negative   Urobilinogen, Ur 0.2 0.2 - 1.0 mg/dL   Nitrite, UA Negative Negative   Microscopic Examination See below:     Comment: Microscopic was indicated and was performed.  Urine Culture     Status: None   Collection Time: 12/18/21  2:25 PM   Specimen: Urine   Urine  Result Value Ref Range   Urine Culture, Routine Final report    Organism ID, Bacteria Comment     Comment: Mixed urogenital flora 25,000-50,000 colony forming units per mL   CBC with Differential/Platelet     Status: Abnormal   Collection Time: 12/18/21  2:25 PM  Result Value Ref Range   WBC 7.3 3.4 - 10.8 x10E3/uL   RBC 4.00 3.77 - 5.28 x10E6/uL   Hemoglobin 11.1 11.1 - 15.9 g/dL   Hematocrit 35.9 34.0 - 46.6 %   MCV 90 79 - 97 fL   MCH 27.8 26.6 - 33.0 pg   MCHC 30.9 (L) 31.5 - 35.7 g/dL   RDW 13.0 11.7 - 15.4 %   Platelets 423 150 - 450 x10E3/uL   Neutrophils 54 Not Estab. %   Lymphs 37 Not Estab. %   Monocytes 7  Not Estab. %   Eos 1 Not Estab. %   Basos 1 Not Estab. %   Neutrophils Absolute 3.8 1.4 - 7.0 x10E3/uL   Lymphocytes Absolute 2.7 0.7 - 3.1 x10E3/uL   Monocytes Absolute 0.5 0.1 - 0.9 x10E3/uL   EOS (ABSOLUTE) 0.1 0.0 - 0.4 x10E3/uL   Basophils Absolute 0.1 0.0 - 0.2 x10E3/uL   Immature Granulocytes 0 Not Estab. %   Immature Grans (Abs) 0.0 0.0 - 0.1 x10E3/uL  Comprehensive metabolic panel     Status: None   Collection Time: 12/18/21  2:25 PM  Result Value Ref Range   Glucose 75 70 - 99 mg/dL   BUN 9 6 - 24 mg/dL   Creatinine, Ser 0.72 0.57 - 1.00 mg/dL   eGFR 99 >59  mL/min/1.73   BUN/Creatinine Ratio 13 9 - 23   Sodium 137 134 - 144 mmol/L   Potassium 3.8 3.5 - 5.2 mmol/L   Chloride 100 96 - 106 mmol/L   CO2 24 20 - 29 mmol/L   Calcium 9.5 8.7 - 10.2 mg/dL   Total Protein 6.4 6.0 - 8.5 g/dL   Albumin 4.3 3.8 - 4.9 g/dL    Comment:               **Please note reference interval change**   Globulin, Total 2.1 1.5 - 4.5 g/dL   Albumin/Globulin Ratio 2.0 1.2 - 2.2   Bilirubin Total 0.2 0.0 - 1.2 mg/dL   Alkaline Phosphatase 98 44 - 121 IU/L   AST 25 0 - 40 IU/L   ALT 25 0 - 32 IU/L  Magnesium     Status: None   Collection Time: 12/18/21  2:25 PM  Result Value Ref Range   Magnesium 1.9 1.6 - 2.3 mg/dL  Microscopic Examination     Status: None   Collection Time: 12/18/21  2:25 PM   Urine  Result Value Ref Range   WBC, UA 0-5 0 - 5 /hpf   RBC, Urine None seen 0 - 2 /hpf   Epithelial Cells (non renal) 0-10 0 - 10 /hpf   Casts None seen None seen /lpf   Bacteria, UA None seen None seen/Few   Objective  Body mass index is 24.9 kg/m. Wt Readings from Last 3 Encounters:  12/23/21 136 lb 1.9 oz (61.7 kg)  12/18/21 137 lb 3.2 oz (62.2 kg)  12/09/21 129 lb 9.6 oz (58.8 kg)   Temp Readings from Last 3 Encounters:  12/23/21 98.5 F (36.9 C) (Oral)  12/18/21 98.5 F (36.9 C) (Oral)  12/11/21 97.9 F (36.6 C)   BP Readings from Last 3 Encounters:  12/23/21 128/70  12/18/21 140/70  12/11/21 115/69   Pulse Readings from Last 3 Encounters:  12/23/21 83  12/18/21 76  12/11/21 69    Physical Exam Vitals and nursing note reviewed.  Constitutional:      Appearance: Normal appearance. She is well-developed and well-groomed.  HENT:     Head: Normocephalic and atraumatic.  Eyes:     Conjunctiva/sclera: Conjunctivae normal.     Pupils: Pupils are equal, round, and reactive to light.  Cardiovascular:     Rate and Rhythm: Normal rate and regular rhythm.     Heart sounds: Normal heart sounds. No murmur heard. Pulmonary:     Effort: Pulmonary  effort is normal.     Breath sounds: Normal breath sounds.  Abdominal:     General: Abdomen is flat. Bowel sounds are normal.     Tenderness: There is  no abdominal tenderness.  Musculoskeletal:        General: No tenderness.       Arms:  Skin:    General: Skin is warm and dry.  Neurological:     General: No focal deficit present.     Mental Status: She is alert and oriented to person, place, and time. Mental status is at baseline.     Cranial Nerves: Cranial nerves 2-12 are intact.     Motor: Motor function is intact.     Coordination: Coordination is intact.     Gait: Gait is intact.  Psychiatric:        Attention and Perception: Attention and perception normal.        Mood and Affect: Mood and affect normal.        Speech: Speech normal.        Behavior: Behavior normal. Behavior is cooperative.        Thought Content: Thought content normal.        Cognition and Memory: Cognition and memory normal.        Judgment: Judgment normal.     Assessment  Plan  Rib pain on right side - Plan: DG Ribs Unilateral Right r/o right rib fracture h/o rib fractures years ago dexa T score -2.2 01/08/21   Pleuritic chest pain - Plan: D-Dimer r/o PE, Quantitative, DG Ribs Unilateral Right Cxr last week 12/18/21 negative   Gallstones  Consider surgery consult in the future   HM  See last visit   Provider: Dr. Olivia Mackie [facility]

## 2021-12-24 ENCOUNTER — Telehealth: Payer: Self-pay | Admitting: Internal Medicine

## 2021-12-24 LAB — D-DIMER, QUANTITATIVE: D-Dimer, Quant: 0.49 mcg/mL FEU (ref <0.50)

## 2021-12-24 LAB — CORTISOL-AM, BLOOD: Cortisol - AM: 14.9 ug/dL

## 2021-12-24 NOTE — Telephone Encounter (Signed)
Patient dropped off FMLA paper work for Dr Olivia Mackie to complete for her husband to take her back and forth from [place] appointments. Per patient Dr Olivia Mackie is aware that patient was going to drop off FMLA paper work. Forms are up front in Dr Audrie Gallus color folder.

## 2022-01-01 ENCOUNTER — Ambulatory Visit (INDEPENDENT_AMBULATORY_CARE_PROVIDER_SITE_OTHER): Payer: 59 | Admitting: "Endocrinology

## 2022-01-01 ENCOUNTER — Encounter: Payer: Self-pay | Admitting: "Endocrinology

## 2022-01-01 VITALS — BP 136/88 | HR 80 | Ht 62.0 in | Wt 140.4 lb

## 2022-01-01 DIAGNOSIS — E222 Syndrome of inappropriate secretion of antidiuretic hormone: Secondary | ICD-10-CM

## 2022-01-01 DIAGNOSIS — E871 Hypo-osmolality and hyponatremia: Secondary | ICD-10-CM | POA: Diagnosis not present

## 2022-01-01 DIAGNOSIS — E2749 Other adrenocortical insufficiency: Secondary | ICD-10-CM | POA: Insufficient documentation

## 2022-01-01 HISTORY — DX: Syndrome of inappropriate secretion of antidiuretic hormone: E22.2

## 2022-01-01 NOTE — Progress Notes (Signed)
Endocrinology Consult Note                                            01/01/2022, 10:03 AM   Subjective:    Patient ID: Shelby Colon, female    DOB: Mar 21, 1966, PCP McLean-Scocuzza, Nino Glow, MD   Past Medical History:  Diagnosis Date   Abnormal Pap smear of cervix    h/o LSIL pap with HPV +; 01/2019 pap negative negative HPV   Anxiety    Atrial fibrillation (HCC)    B12 deficiency    Chronic pain syndrome    Colon polyps    Congenital absence of right kidney    Degeneration of lumbar intervertebral disc    Depression    Early menopause    Gallstones    Gastric bypass status for obesity    2013, weight 258lb   Headache    Heart murmur    MVP   History of chicken pox    Hypercholesterolemia    Hypertension    Iron deficiency    Iron deficiency anemia 03/02/2019   Neuropathy    Rheumatoid arthritis ([place])    as child    Scoliosis of lumbar spine    Solitary kidney, congenital    pt thinks has left kidney only no right kidney   Thyroid disease    in the past    UTI (urinary tract infection)    Vitamin D deficiency    Past Surgical History:  Procedure Laterality Date   ANTERIOR CERVICAL DECOMPRESSION/DISCECTOMY FUSION 4 LEVELS N/A 11/28/2019   Procedure: ANTERIOR CERVICAL DECOMPRESSION/DISCECTOMY FUSION 4 LEVELS C3-7;  Surgeon: Meade Maw, MD;  Location: [facility] ORS;  Service: Neurosurgery;  Laterality: N/A;   AUGMENTATION MAMMAPLASTY Bilateral 2009   BACK SURGERY  11/04/2014   fusion of spine of lumbosacral region 2 rods and 16 screws    BREAST ENHANCEMENT SURGERY     COLONOSCOPY WITH PROPOFOL N/A 01/06/2018   Procedure: COLONOSCOPY WITH PROPOFOL;  Surgeon: Virgel Manifold, MD;  Location: [facility] END[facility];  Service: Endoscopy;  Laterality: N/A;   COLONOSCOPY WITH PROPOFOL N/A 03/19/2019   Procedure: COLONOSCOPY WITH PROPOFOL;  Surgeon: Virgel Manifold, MD;  Location: [facility] ENDOSCOPY;  Service: Endoscopy;  Laterality: N/A;   excess skin removal      KNEE ARTHROSCOPY Bilateral    x 7 knees b/l (2 surgeries on right knee) total 1 bone graft and 5 arthroscopy total knee    ROUX-EN-Y PROCEDURE     TOTAL KNEE ARTHROPLASTY     left 09/21/18 [name] [name]   TOTAL KNEE ARTHROPLASTY     right knee Dr. Lavonia Drafts    Social History   Socioeconomic History   Marital status: Married    Spouse name: Not on file   Number of children: Not on file   Years of education: Not on file   Highest education level: Not on file  Occupational History   Not on file  Tobacco Use   Smoking status: Never   Smokeless tobacco: Never  Vaping Use   Vaping Use: Never used  Substance and Sexual Activity   Alcohol use: Yes    Comment: socially   Drug use: No   Sexual activity: Yes  Other Topics Concern   Not on file  Social History Narrative   Married since 2012  No kids    adopted   From [facility] moved to [facility] and now lives here    Currently unemployed used to be [place] unemployed since 04/2018    BA degree    No guns, wears Colon belt, safe in relationship    Social Determinants of Radio broadcast assistant Strain: Not on file  Food Insecurity: Not on file  Transportation Needs: Not on file  Physical Activity: Not on file  Stress: Not on file  Social Connections: Not on file   Family History  Adopted: Yes  Problem Relation Age of Onset   Arthritis Mother    Drug abuse Mother        heriod OD   Early death Mother    Diabetes Father    Hypertension Father    Depression Father    Arthritis Father    Drug abuse Father    Diabetes Sister    Fibromyalgia Sister    Rashes / Skin problems Sister        PSORIASIS   Diabetes Brother    Osteoarthritis Maternal Grandmother    Aneurysm Maternal Aunt    Thyroid disease Maternal Aunt    Fibromyalgia Maternal Aunt    Breast cancer Maternal Aunt    Cancer Maternal Aunt        breast cancer    Outpatient  Encounter Medications as of 01/01/2022  Medication Sig   conjugated estrogens (PREMARIN) vaginal cream Place 1 Applicatorful vaginally 2 (two) times a week.   DULoxetine (CYMBALTA) 60 MG capsule Take 60 mg by mouth daily.   pregabalin (LYRICA) 100 MG capsule Take 1 capsule (100 mg total) by mouth at bedtime. 50 mg bid, 150 mg qhs (Patient taking differently: Take 100 mg by mouth at bedtime. 50 mg tid, 100 mg qhs)   pregabalin (LYRICA) 50 MG capsule 50 mg bid, 150 mg qhs (Patient taking differently: 50 mg 3 (three) times daily. 50 mg bid, 150 mg qhs)   No facility-administered encounter medications on file as of 01/01/2022.   ALLERGIES: Allergies  Allergen Reactions   Morphine Nausea And Vomiting    VACCINATION STATUS: Immunization History  Administered Date(s) Administered   Influenza,inj,Quad PF,6+ Mos 06/10/2020   Influenza-Unspecified 04/14/2019   PFIZER(Purple Top)SARS-COV-2 Vaccination 12/22/2019, 01/12/2020   Tdap 02/01/2017    HPI Shelby Colon is 56 y.o. female who presents today with a medical history as above. she is being seen in consultation for hyponatremia requested by McLean-Scocuzza, Nino Glow, MD.   She was recently hospitalized for altered mental status when she was found to have severe hyponatremia.  She was also found to have clear electrolyte abnormalities including her hyperosmolality. She has responded to IV fluids as well as oral salt administration with subsequent stabilization of her electrolytes. Her admission hypocortisolemia has also corrected. Leading up to her hospitalization, she recalls having " a couple" beers. She denies medical problems such as cirrhosis, CHF, nor kidney disease. She is on Cymbalta and pregabalin. This patient underwent gastric surgery in the past when she weighed up to 250 pounds, currently 140 pounds.  Her MRI of the brain showed stable appearance of right middle cranial fossa arachnoid cyst measuring 2.6 cm.  A few nonspecific  punctate foci of T2 hyperintensity within the white matter of the cerebral hemispheres.  She has appointment to see a neurologist.  Inflammatory or infectious processes or demyelinating diseases will not excluded.  She never had problems with her sodium.  She denies any prior history of adrenal, thyroid, pituitary dysfunctions.  Review of Systems  Constitutional: + Fluctuating body weight, history of bariatric surgery which allowed her to lose approximately 100 pounds,  no fatigue, no subjective hyperthermia, no subjective hypothermia Eyes: no blurry vision, no xerophthalmia ENT: no sore throat, no nodules palpated in throat, no dysphagia/odynophagia, no hoarseness Cardiovascular: no Chest Pain, no Shortness of Breath, no palpitations, no leg swelling Respiratory: no cough, no shortness of breath Gastrointestinal: no Nausea/Vomiting/Diarhhea Musculoskeletal: no muscle/joint aches Skin: no rashes Neurological: + Some tingling/numbness on upper extremities,  no dizziness Psychiatric: no depression, no anxiety  Objective:       01/01/2022    8:20 AM 12/23/2021    8:13 AM 12/18/2021    1:34 PM  Vitals with BMI  Height '5\' 2"'$  '5\' 2"'$  '5\' 2"'$   Weight 140 lbs 6 oz 136 lbs 2 oz 137 lbs 3 oz  BMI 25.67 97.98 92.11  Systolic 941 740 814  Diastolic 88 70 70  Pulse 80 83 76    BP 136/88   Pulse 80   Ht '5\' 2"'$  (1.575 m)   Wt 140 lb 6.4 oz (63.7 kg)   BMI 25.68 kg/m   Wt Readings from Last 3 Encounters:  01/01/22 140 lb 6.4 oz (63.7 kg)  12/23/21 136 lb 1.9 oz (61.7 kg)  12/18/21 137 lb 3.2 oz (62.2 kg)    Physical Exam  Constitutional:  Body mass index is 25.68 kg/m.,  not in acute distress, normal state of mind Eyes: PERRLA, EOMI, no exophthalmos ENT: moist mucous membranes, no gross thyromegaly, no gross cervical lymphadenopathy Cardiovascular: normal precordial activity, Regular Rate and Rhythm, no Murmur/Rubs/Gallops Respiratory:  adequate breathing efforts, no gross chest deformity,  Clear to auscultation bilaterally Gastrointestinal: abdomen soft, Non -tender, No distension, Bowel Sounds present, no gross organomegaly Musculoskeletal: no gross deformities, strength intact in all four extremities Skin: moist, warm, no rashes Neurological: no tremor with outstretched hands, Deep tendon reflexes normal in bilateral lower extremities.  CMP ( most recent) CMP     Component Value Date/Time   NA 137 12/18/2021 1425   K 3.8 12/18/2021 1425   CL 100 12/18/2021 1425   CO2 24 12/18/2021 1425   GLUCOSE 75 12/18/2021 1425   GLUCOSE 86 12/11/2021 0553   BUN 9 12/18/2021 1425   CREATININE 0.72 12/18/2021 1425   CREATININE 0.74 12/05/2017 1044   CALCIUM 9.5 12/18/2021 1425   PROT 6.4 12/18/2021 1425   ALBUMIN 4.3 12/18/2021 1425   AST 25 12/18/2021 1425   ALT 25 12/18/2021 1425   ALKPHOS 98 12/18/2021 1425   BILITOT 0.2 12/18/2021 1425   GFRNONAA >60 12/11/2021 0553   GFRAA >60 11/21/2019 0907     Diabetic Labs (most recent): Lab Results  Component Value Date   HGBA1C 5.2 02/13/2018     Lipid Panel ( most recent) Lipid Panel     Component Value Date/Time   CHOL 184 02/15/2019 1004   TRIG 55 02/15/2019 1004   HDL 117 02/15/2019 1004   CHOLHDL 1.6 02/15/2019 1004   CHOLHDL 1.8 12/05/2017 1044   LDLCALC 56 02/15/2019 1004   [place] 74 12/05/2017 1044   LABVLDL 11 02/15/2019 1004      Lab Results  Component Value Date   TSH 3.817 12/08/2021   TSH 2.890 02/15/2019       Assessment & Plan:   1. Hyponatremia 2. SIADH (syndrome of inappropriate ADH production) ([place])  - Shelby Colon  is being seen  at a kind request of McLean-Scocuzza, Nino Glow, MD. - I have reviewed her available  records and clinically evaluated the patient. - Based on these reviews, she has had hyponatremia with altered mental status which required hospital treatment in the middle of July 2023.  Subsequently, she has responded to IV fluids as well as oral sodium intake.  She has no  acute complaints today. Review of her records indicate a possibility of SIADH. This could be of multiple etiology including alcohol, Cymbalta, idiopathic.  She is advised on fluid restrictions not to exceed 1.5 L a day.  She is allowed to drink 1 L of water per day and another half a liter of  other  fluids. She is advised to wean off of alcohol completely. Her more recent a.m. cortisol is normal.  She would not need intervention with steroids for now. -She is encouraged to maintain her visit with a neurologist for better explanation of her MRI findings. -She will have repeat labs before her next visit including  - Comprehensive metabolic panel - Osmolality - Osmolality, urine - Sodium, urine, random - Cortisol-am, blood - she is advised to maintain close follow up with McLean-Scocuzza, Nino Glow, MD for primary care needs.   - Time spent with the patient: 50 minutes, of which >50% was spent in  counseling her about her hyponatremia, hypocortisolemia, and the rest in obtaining information about her symptoms, reviewing her previous labs/studies ( including abstractions from other facilities),  evaluations, and treatments,  and developing a plan to confirm diagnosis and long term treatment based on the latest standards of care/guidelines; and documenting her care.  Shelby Colon participated in the discussions, expressed understanding, and voiced agreement with the above plans.  All questions were answered to her satisfaction. she is encouraged to contact clinic should she have any questions or concerns prior to her return visit.  Follow up plan: Return in about 3 months (around 04/03/2022) for F/U with Pre-visit Labs.   Glade Lloyd, MD [facility] [address] [place], [place] [address] Phone: [phone]  Fax: [phone]     01/01/2022, 10:03 AM  This note was partially dictated with voice recognition software. Similar sounding  words can be transcribed inadequately or may not  be corrected upon review.

## 2022-01-03 ENCOUNTER — Encounter: Payer: Self-pay | Admitting: Internal Medicine

## 2022-01-04 ENCOUNTER — Telehealth: Payer: Self-pay

## 2022-01-04 DIAGNOSIS — R2689 Other abnormalities of gait and mobility: Secondary | ICD-10-CM | POA: Diagnosis not present

## 2022-01-04 DIAGNOSIS — M6281 Muscle weakness (generalized): Secondary | ICD-10-CM | POA: Diagnosis not present

## 2022-01-04 DIAGNOSIS — R5382 Chronic fatigue, unspecified: Secondary | ICD-10-CM | POA: Diagnosis not present

## 2022-01-04 DIAGNOSIS — R159 Full incontinence of feces: Secondary | ICD-10-CM | POA: Diagnosis not present

## 2022-01-04 DIAGNOSIS — E871 Hypo-osmolality and hyponatremia: Secondary | ICD-10-CM | POA: Diagnosis not present

## 2022-01-04 DIAGNOSIS — G930 Cerebral cysts: Secondary | ICD-10-CM | POA: Diagnosis not present

## 2022-01-04 DIAGNOSIS — E559 Vitamin D deficiency, unspecified: Secondary | ICD-10-CM | POA: Diagnosis not present

## 2022-01-04 DIAGNOSIS — Z1159 Encounter for screening for other viral diseases: Secondary | ICD-10-CM | POA: Diagnosis not present

## 2022-01-04 DIAGNOSIS — Z0279 Encounter for issue of other medical certificate: Secondary | ICD-10-CM

## 2022-01-04 NOTE — Telephone Encounter (Signed)
Paperwork ready for scan and fax and call pt thanks

## 2022-01-04 NOTE — Telephone Encounter (Signed)
Pt notified of completed forms.

## 2022-01-06 ENCOUNTER — Telehealth: Payer: Self-pay

## 2022-01-06 NOTE — Telephone Encounter (Signed)
She confirmed appt with Stacy on 02/02/2022.

## 2022-01-06 NOTE — Telephone Encounter (Signed)
Patient left a voicemail wanting to make an appt with Dr. Izora Ribas, you can offer her an appt with Andee Poles or Marzetta Board since her last MRI was in 2021.

## 2022-01-11 ENCOUNTER — Ambulatory Visit: Payer: Medicare Other | Admitting: Nurse Practitioner

## 2022-01-13 DIAGNOSIS — R5382 Chronic fatigue, unspecified: Secondary | ICD-10-CM | POA: Diagnosis not present

## 2022-01-18 ENCOUNTER — Other Ambulatory Visit: Payer: Self-pay | Admitting: Internal Medicine

## 2022-01-18 DIAGNOSIS — M545 Low back pain, unspecified: Secondary | ICD-10-CM

## 2022-01-20 ENCOUNTER — Ambulatory Visit
Admission: RE | Admit: 2022-01-20 | Discharge: 2022-01-20 | Disposition: A | Discharge status: Home / Self Care | Payer: Self-pay | Source: Ambulatory Visit | Attending: Orthopedic Surgery | Admitting: Orthopedic Surgery

## 2022-01-20 ENCOUNTER — Other Ambulatory Visit: Payer: Self-pay

## 2022-01-20 DIAGNOSIS — Z049 Encounter for examination and observation for unspecified reason: Secondary | ICD-10-CM

## 2022-01-29 NOTE — Progress Notes (Unsigned)
Referring Physician:  McLean-Scocuzza, Nino Glow, MD [place],  [place] [address]  Primary Physician:  McLean-Scocuzza, Nino Glow, MD  History of Present Illness: 02/02/2022  History of HTN, MVP, SAIDH, and absence of right kidney. History of gastric bypass. Was in hospital in July for hyponatremia.   Last seen by Dr. Izora Ribas on 09/25/20 for right shoulder and trapezius pain. She has history of ACDF C3-C7 and T10-S1 fusion by Dr. Burman Riis in 2016.   Here today with complaints of more constant pain in right shoulder blade x 6 months. Pain is worse with prolonged standing or sitting. No neck or lower back pain. No radiating arm or leg pain. She has numbness/tingling in right > left hand that is worse at night. No known alleviating factors. No weakness in arms or legs.   Conservative measures:  Physical therapy: No recent PT  Multimodal medical therapy including regular antiinflammatories: cymbalta, lyrica, tylenol, and aleve.  Injections: No recent epidural steroid injections  Past Surgery: see above for cervical and TL fusion.   Shelby Colon has no symptoms of cervical myelopathy.  The symptoms are causing a significant impact on the patient's life.   Review of Systems:  A 10 point review of systems is negative, except for the pertinent positives and negatives detailed in the HPI.  Past Medical History: Past Medical History:  Diagnosis Date   Abnormal Pap smear of cervix    h/o LSIL pap with HPV +; 01/2019 pap negative negative HPV   Anxiety    Atrial fibrillation (HCC)    B12 deficiency    Chronic pain syndrome    Colon polyps    Congenital absence of right kidney    Degeneration of lumbar intervertebral disc    Depression    Early menopause    Gallstones    Gastric bypass status for obesity    2013, weight 258lb   Headache    Heart murmur    MVP   History of chicken pox    Hypercholesterolemia    Hypertension    Iron deficiency    Iron deficiency anemia  03/02/2019   Neuropathy    Rheumatoid arthritis ([place])    as child    Scoliosis of lumbar spine    Solitary kidney, congenital    pt thinks has left kidney only no right kidney   Thyroid disease    in the past    UTI (urinary tract infection)    Vitamin D deficiency     Past Surgical History: Past Surgical History:  Procedure Laterality Date   ANTERIOR CERVICAL DECOMPRESSION/DISCECTOMY FUSION 4 LEVELS N/A 11/28/2019   Procedure: ANTERIOR CERVICAL DECOMPRESSION/DISCECTOMY FUSION 4 LEVELS C3-7;  Surgeon: Meade Maw, MD;  Location: [facility] ORS;  Service: Neurosurgery;  Laterality: N/A;   AUGMENTATION MAMMAPLASTY Bilateral 2009   BACK SURGERY  11/04/2014   fusion of spine of lumbosacral region 2 rods and 16 screws    BREAST ENHANCEMENT SURGERY     COLONOSCOPY WITH PROPOFOL N/A 01/06/2018   Procedure: COLONOSCOPY WITH PROPOFOL;  Surgeon: Virgel Manifold, MD;  Location: [facility] END[facility];  Service: Endoscopy;  Laterality: N/A;   COLONOSCOPY WITH PROPOFOL N/A 03/19/2019   Procedure: COLONOSCOPY WITH PROPOFOL;  Surgeon: Virgel Manifold, MD;  Location: [facility] END[facility];  Service: Endoscopy;  Laterality: N/A;   excess skin removal     KNEE ARTHROSCOPY Bilateral    x 7 knees b/l (2 surgeries on right knee) total 1 bone graft and 5 arthroscopy total knee  ROUX-EN-Y PROCEDURE     TOTAL KNEE ARTHROPLASTY     left 09/21/18 [name] [name]   TOTAL KNEE ARTHROPLASTY     right knee Dr. Lavonia Drafts     Allergies: Allergies as of 02/02/2022 - Review Complete 02/02/2022  Allergen Reaction Noted   Morphine Nausea And Vomiting 05/14/2014    Medications: Outpatient Encounter Medications as of 02/02/2022  Medication Sig   conjugated estrogens (PREMARIN) vaginal cream Place 1 Applicatorful vaginally 2 (two) times a week.   DULoxetine (CYMBALTA) 60 MG capsule Take 60 mg by mouth daily.   pregabalin (LYRICA) 100 MG capsule Take 1 capsule (100 mg total) by mouth at  bedtime. 50 mg bid, 150 mg qhs (Patient taking differently: Take 100 mg by mouth at bedtime. 50 mg tid, 100 mg qhs)   pregabalin (LYRICA) 50 MG capsule TAKE ONE CAPSULE BY MOUTH TWICE A DAY AND TAKE ONE CAPSULE BY MOUTH AT BEDTIME   No facility-administered encounter medications on file as of 02/02/2022.    Social History: Social History   Tobacco Use   Smoking status: Never   Smokeless tobacco: Never  Vaping Use   Vaping Use: Never used  Substance Use Topics   Alcohol use: Yes    Comment: socially   Drug use: No    Family Medical History: Family History  Adopted: Yes  Problem Relation Age of Onset   Arthritis Mother    Drug abuse Mother        heriod OD   Early death Mother    Diabetes Father    Hypertension Father    Depression Father    Arthritis Father    Drug abuse Father    Diabetes Sister    Fibromyalgia Sister    Rashes / Skin problems Sister        PSORIASIS   Diabetes Brother    Osteoarthritis Maternal Grandmother    Aneurysm Maternal Aunt    Thyroid disease Maternal Aunt    Fibromyalgia Maternal Aunt    Breast cancer Maternal Aunt    Cancer Maternal Aunt        breast cancer     Physical Examination: Vitals:   02/02/22 1057  BP: 128/82    General: Patient is well developed, well nourished, calm, collected, and in no apparent distress. Attention to examination is appropriate.  Respiratory: Patient is breathing without any difficulty.   NEUROLOGICAL:     Awake, alert, oriented to person, place, and time.  Speech is clear and fluent. Fund of knowledge is appropriate.   Cranial Nerves: Pupils equal round and reactive to light.  Facial tone is symmetric.  Facial sensation is symmetric.  ROM of spine:  Limited ROM of thoracolumbar spine with no pain.   Well healed cervical and thoracolumbar incisions.   No abnormal lesions on exposed skin.   Strength: Side Biceps Triceps Deltoid Interossei Grip Wrist Ext. Wrist Flex.  R '5 5 5 5 5 5 5  '$ L '5 5  5 5 5 5 5   '$ Side Iliopsoas Quads Hamstring PF DF EHL  R '5 5 5 5 5 5  '$ L '5 5 5 5 5 5   '$ Reflexes are 2+ and symmetric at the biceps, triceps, brachioradialis, patella and achilles.   Hoffman's is positive on right, negative on left.   Bilateral upper and lower extremity sensation is intact to light touch. Some subjective numbness left lateral calf.   She has mild tenderness right scapular region and into trapezial  region as well on right.   No evidence of dysmetria noted.  She has slight limp to her gait.   Medical Decision Making  Imaging: Lumbar xrays 12/21/21:  Instrumented fusion T10-S1. No compression deformity seen (see report in chart).   I have personally reviewed the images and agree with the above interpretation.  Assessment and Plan: Shelby Colon is a pleasant 56 y.o. female with 6 month history of more constant pain in right shoulder blade that is worse with prolonged standing or sitting. No neck or lower back pain. No radiating arm or leg pain.   History of ACDF C3-C7 and T10-S1 fusion by Dr. Burman Riis in 2016.   Xrays look good and pain appears more myofasical in nature with tenderness over right scapular region.   Above treatment options discussed with patient and following plan made:   - New prescription for robaxin. Reviewed dosing and side effects. Take with food.  - Care with NSAIDs due to having one kidney and history of gastric bypass.  - Okay to restart going to gym. Start slow and stop if she has increased pain.  - Discussed referral to PT. Will hold off for now, but can revisit if no improvement.  - Follow up for phone visit in 6 weeks.   I spent a total of 30 minutes in face-to-face and non-face-to-face activities related to this patient's care today.  Thank you for involving me in the care of this patient.   [name] PA-C Dept. of Neurosurgery

## 2022-02-02 ENCOUNTER — Encounter: Payer: Self-pay | Admitting: Orthopedic Surgery

## 2022-02-02 ENCOUNTER — Ambulatory Visit (INDEPENDENT_AMBULATORY_CARE_PROVIDER_SITE_OTHER): Payer: 59 | Admitting: Orthopedic Surgery

## 2022-02-02 VITALS — BP 128/82 | Ht 62.0 in | Wt 140.4 lb

## 2022-02-02 DIAGNOSIS — M4325 Fusion of spine, thoracolumbar region: Secondary | ICD-10-CM | POA: Diagnosis not present

## 2022-02-02 DIAGNOSIS — M546 Pain in thoracic spine: Secondary | ICD-10-CM | POA: Diagnosis not present

## 2022-02-02 DIAGNOSIS — M7918 Myalgia, other site: Secondary | ICD-10-CM

## 2022-02-02 MED ORDER — METHOCARBAMOL 500 MG PO TABS: 500.0000 mg | ORAL_TABLET | Freq: Three times a day (TID) | ORAL | 0 refills | Status: DC | PRN

## 2022-02-02 NOTE — Patient Instructions (Signed)
It was so nice to see you today, I am sorry that you are hurting   Your back xrays showed that hardware is in good position. I think most of your pain is more muscular in nature.    I sent a prescription for methocarbamol to help with muscle spasms. Use only as needed and be careful, this can make you sleepy.   Okay to go back to the gym. Start slow and stop if anything makes your pain worse.   I will talk during a phone visit in about 6 weeks. If no improvement, we may revisit physical therapy.   Please do not hesitate to call if you have any questions or concerns. You can also message me in [place].   It was so nice to talk to both you and your husband today!  [name] PA-C [phone]

## 2022-02-03 ENCOUNTER — Encounter: Payer: Self-pay | Admitting: Family Medicine

## 2022-02-03 ENCOUNTER — Ambulatory Visit (INDEPENDENT_AMBULATORY_CARE_PROVIDER_SITE_OTHER): Payer: 59 | Admitting: Family Medicine

## 2022-02-03 VITALS — BP 138/86 | HR 72 | Temp 98.0°F | Ht 62.0 in | Wt 140.6 lb

## 2022-02-03 DIAGNOSIS — I10 Essential (primary) hypertension: Secondary | ICD-10-CM

## 2022-02-03 DIAGNOSIS — E222 Syndrome of inappropriate secretion of antidiuretic hormone: Secondary | ICD-10-CM

## 2022-02-03 DIAGNOSIS — G930 Cerebral cysts: Secondary | ICD-10-CM

## 2022-02-03 NOTE — Patient Instructions (Signed)
It was a pleasure meeting you today. Thank you for allowing me to take part in your health care.  Our goals for today as we discussed include:  Follow up with Neurology as scheduled  Follow up with Endocrinology as scheduled  Follow up with Cardiology as scheduled  Will need to get your recent PAP results from OBGYN   Please follow-up with PCP in 3 months or sooner if needed  If you have any questions or concerns, please do not hesitate to call the office at [phone].  I look forward to our next visit and until then take care and stay safe.  Regards,   Carollee Leitz, MD   [facility]

## 2022-02-03 NOTE — Progress Notes (Signed)
    SUBJECTIVE:   CHIEF COMPLAINT / HPI: to transfer care  Patient presents to clinic to transfer care  Requesting FMLA form to be corrected  No acute concerns today   PERTINENT  PMH / PSH:  Chronic pain Anxiety/Depression SIADH  OBJECTIVE:   BP 138/86 (BP Location: Left Arm, Patient Position: Sitting, Cuff Size: Normal)   Pulse 72   Temp 98 F (36.7 C) (Oral)   Ht '5\' 2"'$  (1.575 m)   Wt 140 lb 9.6 oz (63.8 kg)   SpO2 99%   BMI 25.72 kg/m    General: Alert, no acute distress Cardio: Normal S1 and S2, RRR, no r/m/g Pulm: CTAB, normal work of breathing Abdomen: Bowel sounds normal. Abdomen soft and non-tender.  Extremities: No peripheral edema.  Neuro: Cranial nerves grossly intact   ASSESSMENT/PLAN:   HTN (hypertension) Not currently on antihypertensives.  Well controlled -Continue to monitor -Follow up in 3 months  Arachnoid cyst Followed by Neurology  SIADH (syndrome of inappropriate ADH production) ([place]) Followed by Endocrinology    FMLA forms corrected by previous PCP and given to [place] for patient pick up  Carollee Leitz, MD

## 2022-02-09 ENCOUNTER — Emergency Department: Payer: 59

## 2022-02-09 ENCOUNTER — Observation Stay
Admission: EM | Admit: 2022-02-09 | Discharge: 2022-02-10 | Disposition: A | Discharge status: Home / Self Care | Payer: 59 | Attending: Internal Medicine | Admitting: Internal Medicine

## 2022-02-09 ENCOUNTER — Encounter: Payer: Self-pay | Admitting: Family Medicine

## 2022-02-09 ENCOUNTER — Other Ambulatory Visit: Payer: Self-pay

## 2022-02-09 DIAGNOSIS — Z79899 Other long term (current) drug therapy: Secondary | ICD-10-CM | POA: Insufficient documentation

## 2022-02-09 DIAGNOSIS — E871 Hypo-osmolality and hyponatremia: Principal | ICD-10-CM | POA: Diagnosis present

## 2022-02-09 DIAGNOSIS — F419 Anxiety disorder, unspecified: Secondary | ICD-10-CM | POA: Diagnosis present

## 2022-02-09 DIAGNOSIS — F1090 Alcohol use, unspecified, uncomplicated: Secondary | ICD-10-CM | POA: Diagnosis present

## 2022-02-09 DIAGNOSIS — I4891 Unspecified atrial fibrillation: Secondary | ICD-10-CM | POA: Insufficient documentation

## 2022-02-09 DIAGNOSIS — Z96653 Presence of artificial knee joint, bilateral: Secondary | ICD-10-CM | POA: Insufficient documentation

## 2022-02-09 DIAGNOSIS — F32A Depression, unspecified: Secondary | ICD-10-CM | POA: Diagnosis present

## 2022-02-09 DIAGNOSIS — Z789 Other specified health status: Secondary | ICD-10-CM

## 2022-02-09 DIAGNOSIS — Z905 Acquired absence of kidney: Secondary | ICD-10-CM | POA: Diagnosis not present

## 2022-02-09 DIAGNOSIS — I10 Essential (primary) hypertension: Secondary | ICD-10-CM | POA: Insufficient documentation

## 2022-02-09 DIAGNOSIS — G930 Cerebral cysts: Secondary | ICD-10-CM | POA: Diagnosis present

## 2022-02-09 DIAGNOSIS — R4182 Altered mental status, unspecified: Secondary | ICD-10-CM | POA: Diagnosis present

## 2022-02-09 HISTORY — DX: Alcohol use, unspecified, uncomplicated: F10.90

## 2022-02-09 HISTORY — DX: Other specified health status: Z78.9

## 2022-02-09 LAB — URINALYSIS, ROUTINE W REFLEX MICROSCOPIC
Bilirubin Urine: NEGATIVE
Glucose, UA: NEGATIVE mg/dL
Hgb urine dipstick: NEGATIVE
Ketones, ur: NEGATIVE mg/dL
Leukocytes,Ua: NEGATIVE
Nitrite: NEGATIVE
Protein, ur: 30 mg/dL — AB
Specific Gravity, Urine: 1.012 (ref 1.005–1.030)
pH: 6 (ref 5.0–8.0)

## 2022-02-09 LAB — CBC WITH DIFFERENTIAL/PLATELET
Abs Immature Granulocytes: 0.03 10*3/uL (ref 0.00–0.07)
Basophils Absolute: 0 10*3/uL (ref 0.0–0.1)
Basophils Relative: 0 %
Eosinophils Absolute: 0 10*3/uL (ref 0.0–0.5)
Eosinophils Relative: 0 %
HCT: 36.9 % (ref 36.0–46.0)
Hemoglobin: 12 g/dL (ref 12.0–15.0)
Immature Granulocytes: 0 %
Lymphocytes Relative: 11 %
Lymphs Abs: 1 10*3/uL (ref 0.7–4.0)
MCH: 26.6 pg (ref 26.0–34.0)
MCHC: 32.5 g/dL (ref 30.0–36.0)
MCV: 81.8 fL (ref 80.0–100.0)
Monocytes Absolute: 0.4 10*3/uL (ref 0.1–1.0)
Monocytes Relative: 4 %
Neutro Abs: 8.2 10*3/uL — ABNORMAL HIGH (ref 1.7–7.7)
Neutrophils Relative %: 85 %
Platelets: 420 10*3/uL — ABNORMAL HIGH (ref 150–400)
RBC: 4.51 MIL/uL (ref 3.87–5.11)
RDW: 12.3 % (ref 11.5–15.5)
WBC: 9.7 10*3/uL (ref 4.0–10.5)
nRBC: 0 % (ref 0.0–0.2)

## 2022-02-09 LAB — COMPREHENSIVE METABOLIC PANEL
ALT: 28 U/L (ref 0–44)
AST: 41 U/L (ref 15–41)
Albumin: 4.4 g/dL (ref 3.5–5.0)
Alkaline Phosphatase: 103 U/L (ref 38–126)
Anion gap: 19 — ABNORMAL HIGH (ref 5–15)
BUN: 10 mg/dL (ref 6–20)
CO2: 20 mmol/L — ABNORMAL LOW (ref 22–32)
Calcium: 9.2 mg/dL (ref 8.9–10.3)
Chloride: 84 mmol/L — ABNORMAL LOW (ref 98–111)
Creatinine, Ser: 0.67 mg/dL (ref 0.44–1.00)
GFR, Estimated: >60 mL/min (ref >60)
Glucose, Bld: 112 mg/dL — ABNORMAL HIGH (ref 70–99)
Potassium: 3.7 mmol/L (ref 3.5–5.1)
Sodium: 123 mmol/L — ABNORMAL LOW (ref 135–145)
Total Bilirubin: 1.2 mg/dL (ref 0.3–1.2)
Total Protein: 7.6 g/dL (ref 6.5–8.1)

## 2022-02-09 LAB — MAGNESIUM: Magnesium: 1.5 mg/dL — ABNORMAL LOW (ref 1.7–2.4)

## 2022-02-09 LAB — SODIUM, URINE, RANDOM: Sodium, Ur: 81 mmol/L

## 2022-02-09 LAB — ETHANOL: Alcohol, Ethyl (B): 23 mg/dL — ABNORMAL HIGH (ref <10)

## 2022-02-09 LAB — URINE DRUG SCREEN, QUALITATIVE (ARMC ONLY)
Amphetamines, Ur Screen: NOT DETECTED
Barbiturates, Ur Screen: NOT DETECTED
Benzodiazepine, Ur Scrn: NOT DETECTED
Cannabinoid 50 Ng, Ur ~~LOC~~: POSITIVE — AB
Cocaine Metabolite,Ur ~~LOC~~: NOT DETECTED
MDMA (Ecstasy)Ur Screen: NOT DETECTED
Methadone Scn, Ur: NOT DETECTED
Opiate, Ur Screen: NOT DETECTED
Phencyclidine (PCP) Ur S: NOT DETECTED
Tricyclic, Ur Screen: NOT DETECTED

## 2022-02-09 LAB — LACTIC ACID, PLASMA
Lactic Acid, Venous: 2.6 mmol/L (ref 0.5–1.9)
Lactic Acid, Venous: 1.3 mmol/L (ref 0.5–1.9)

## 2022-02-09 LAB — OSMOLALITY: Osmolality: 260 mOsm/kg — ABNORMAL LOW (ref 275–295)

## 2022-02-09 LAB — PREGNANCY, URINE: Preg Test, Ur: NEGATIVE

## 2022-02-09 LAB — BETA-HYDROXYBUTYRIC ACID: Beta-Hydroxybutyric Acid: 0.15 mmol/L (ref 0.05–0.27)

## 2022-02-09 LAB — TSH: TSH: 3.135 u[IU]/mL (ref 0.350–4.500)

## 2022-02-09 LAB — OSMOLALITY, URINE: Osmolality, Ur: 352 mOsm/kg (ref 300–900)

## 2022-02-09 MED ORDER — SODIUM CHLORIDE 0.9 % IV BOLUS
500.0000 mL | Freq: Once | INTRAVENOUS | Status: AC
  Administered 2022-02-09: 500 mL via INTRAVENOUS

## 2022-02-09 MED ORDER — SENNOSIDES-DOCUSATE SODIUM 8.6-50 MG PO TABS: 1.0000 | ORAL_TABLET | Freq: Every evening | ORAL | Status: DC | PRN

## 2022-02-09 MED ORDER — ACETAMINOPHEN 650 MG RE SUPP: 650.0000 mg | Freq: Four times a day (QID) | RECTAL | Status: DC | PRN

## 2022-02-09 MED ORDER — MAGNESIUM SULFATE 2 GM/50ML IV SOLN
2.0000 g | Freq: Once | INTRAVENOUS | Status: AC
  Administered 2022-02-09: 2 g via INTRAVENOUS
  Filled 2022-02-09: qty 50

## 2022-02-09 MED ORDER — ENOXAPARIN SODIUM 40 MG/0.4ML IJ SOSY
40.0000 mg | PREFILLED_SYRINGE | INTRAMUSCULAR | Status: DC
  Administered 2022-02-09: 40 mg via SUBCUTANEOUS
  Filled 2022-02-09: qty 0.4

## 2022-02-09 MED ORDER — COSYNTROPIN 0.25 MG IJ SOLR
0.2500 mg | Freq: Once | INTRAMUSCULAR | Status: AC
  Administered 2022-02-10: 0.25 mg via INTRAVENOUS
  Filled 2022-02-09 (×2): qty 0.25

## 2022-02-09 MED ORDER — ONDANSETRON HCL 4 MG PO TABS: 4.0000 mg | ORAL_TABLET | Freq: Four times a day (QID) | ORAL | Status: DC | PRN

## 2022-02-09 MED ORDER — ONDANSETRON HCL 4 MG/2ML IJ SOLN: 4.0000 mg | Freq: Four times a day (QID) | INTRAMUSCULAR | Status: DC | PRN

## 2022-02-09 MED ORDER — ACETAMINOPHEN 325 MG PO TABS
650.0000 mg | ORAL_TABLET | Freq: Four times a day (QID) | ORAL | Status: DC | PRN
  Administered 2022-02-10: 650 mg via ORAL
  Filled 2022-02-09: qty 2

## 2022-02-09 MED ORDER — SODIUM CHLORIDE 0.9% FLUSH
3.0000 mL | Freq: Two times a day (BID) | INTRAVENOUS | Status: DC
  Administered 2022-02-09 – 2022-02-10 (×2): 3 mL via INTRAVENOUS

## 2022-02-09 NOTE — ED Triage Notes (Signed)
Pt arrives with c/o AMS that started yesterday. Pt brought in by friend for confusion. Pt a&ox2. Pt follows commands, but needs redirection frequently. Pt has hx of SIDAH. Per friend, the last time this happened pts sodium levels were abnormal.

## 2022-02-09 NOTE — Assessment & Plan Note (Signed)
Serum ethanol elevated on admission.  Patient denies any recent alcohol use. -Continue CIWA checks

## 2022-02-09 NOTE — Assessment & Plan Note (Signed)
Presenting with inattention and circumferential/tangential speech.  Otherwise awake, alert, oriented to self, place, year.  May be due to hyponatremia and/or alcohol use.  No signs/symptoms of infection.  CT head shows stable arachnoid cyst without acute changes.

## 2022-02-09 NOTE — Assessment & Plan Note (Signed)
Hold Cymbalta with hyponatremia.

## 2022-02-09 NOTE — Assessment & Plan Note (Addendum)
Not currently on antihypertensives.  Well controlled -Continue to monitor -Follow up in 3 months

## 2022-02-09 NOTE — ED Notes (Signed)
Pt placed on purewick as she is getting up to use the restroom every 10 minutes. Pt had climbed out of the bed and was standing on the side of the room. She is confused, but redirectable. Pt's friend as bedside

## 2022-02-09 NOTE — ED Notes (Signed)
Purewick leaked. Pt placed in depends and gown changed out. After changing pt, pt said she need to use the restroom again.

## 2022-02-09 NOTE — Assessment & Plan Note (Addendum)
Recent admit for similar July 2023.  At the time unclear if secondary to SIADH versus hypoosmolar hyponatremia.  Sodium 123 on admission. -S/p 500 cc normal saline -Hold further fluids pending sodium trend -Urine sodium 81, urine osmolality 352 -Obtain serum osmolality -TSH 3.135 -Obtain a.m. cortisol level with ACTH stim test -Hold Cymbalta

## 2022-02-09 NOTE — ED Provider Notes (Signed)
[facility] Provider Note    Event Date/Time   First MD Initiated Contact with Patient 02/09/22 1817     (approximate)   History   Altered Mental Status   HPI  Shelby Colon is a 56 y.o. female  with pmh anxiety, depression, atrial fibrillation, B12 deficiency. Patient's friend started to notice confusion yesterday, which worsened today.  Her friend reports that she is not able to concentrate cannot think straight and cannot process.  She did have some beers yesterday.  Does not drink every day.  The patient tells me she is here because she is not right.  She has difficulty attending and concentrating on questions so history is limited but she denies headache visual change numbness tingling weakness chest pain abdominal pain vomiting diarrhea or urinary symptoms.  Reviewed admission from 2 months ago when patient was brought with altered mental status and found to have hyponatremia.  Thought to be due to chronic hypovolemia.     Past Medical History:  Diagnosis Date   Abnormal MRI, cervical spine 10/17/2019   FINDINGS: Alignment: Reversal of the normal cervical lordosis with apex at C3-4. Trace retrolisthesis of C4 on C5, C5 on C6, and C6 on C7, with anterolisthesis of C7 on T1. Findings chronic and facet mediated.   Vertebrae: Vertebral body height maintained without evidence for acute or chronic fracture. Bone marrow signal intensity within normal limits. No discrete or worrisome osseous lesions. Pro   Abnormal Pap smear of cervix    h/o LSIL pap with HPV +; 01/2019 pap negative negative HPV   Altered mental status 12/08/2021   Annual physical exam 06/12/2020   Anxiety    Anxiety 06/12/2020   Atrial fibrillation ([place])    B12 deficiency    Cardiac murmur 02/14/2018   Chronic knee pain (Bilateral) 09/07/2018   09/21/2018 left total knee ortho [place] [facility] OP    Chronic pain syndrome    Colon polyps    Congenital absence of right  kidney    Degeneration of lumbar intervertebral disc    Depression    Early menopause    Elevated alkaline phosphatase level 02/23/2019   Gallstones    Gastric bypass status for obesity    2013, weight 258lb   Headache    Heart murmur    MVP   History of chicken pox    HPV in female 11/30/2017   Hypercholesterolemia    Hypertension    Iron deficiency    Iron deficiency anemia 03/02/2019   Neuropathy    Rheumatoid arthritis ([place])    as child    Scoliosis of lumbar spine    Solitary kidney, congenital    pt thinks has left kidney only no right kidney   Thyroid disease    in the past    UTI (urinary tract infection)    Vitamin D deficiency     Patient Active Problem List   Diagnosis Date Noted   SIADH (syndrome of inappropriate ADH production) ([place]) 01/01/2022   Hypocortisolemia ([place]) 01/01/2022   Arachnoid cyst 12/18/2021   Confusion 12/10/2021   Brain cyst 12/10/2021   Hyponatremia 12/08/2021   Hypokalemia 12/08/2021   Transaminitis 12/08/2021   History of fusion of cervical spine 06/12/2020   Anxiety 06/12/2020   Depression, recurrent ([place]) 06/12/2020   PTSD (post-traumatic stress disorder) 06/12/2020   Cervical radiculopathy 11/28/2019   Pharmacologic therapy 10/17/2019   Disorder of skeletal system 10/17/2019   Problems influencing health status 10/17/2019  Chronic knee pain after total replacement of both knee joints 10/17/2019   DDD (degenerative disc disease), cervical 10/17/2019   DDD (degenerative disc disease), thoracic 10/17/2019   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   Thoracogenic scoliosis of thoracolumbar region 10/17/2019   Cervical radiculitis 09/28/2019   Loss of balance 09/28/2019   Cervicalgia (1ry area of Pain) (Bilateral) (R>L) 09/28/2019   Stenosis of cervical spine 09/28/2019   Fall 49/70/2637   High cyclic citrullinated peptide (CCP) antibody level 07/02/2019   Personal history of colonic polyps    Polyp of sigmoid colon    Atrophy  of right kidney 03/05/2019   Iron deficiency anemia 03/02/2019   Hip pain 02/28/2019   Low back pain 02/28/2019   H/O gastric bypass 02/23/2019   Iron deficiency 02/23/2019   Thrombocytosis 02/23/2019   History of atrial fibrillation 02/14/2018   Elevated liver enzymes 02/14/2018   Joint pain 02/14/2018   Neuropathy 02/14/2018   Positive colorectal cancer screening using Cologuard test    Benign neoplasm of ascending colon    Anxiety and depression 11/30/2017   Insomnia 11/30/2017   Postmenopausal 11/30/2017   MVP (mitral valve prolapse) 11/30/2017   Vitamin D deficiency 11/30/2017   HTN (hypertension) 11/30/2017   Osteoarthritis of knee 10/04/2017   History of total knee arthroplasty 09/15/2017   Papanicolaou smear of cervix with positive high risk human papilloma virus (HPV) test 03/26/2017   Hypercholesteremia 02/02/2017   Chronic pain syndrome 01/27/2017   DDD (degenerative disc disease), lumbosacral 01/27/2017   Congenital absence of right kidney 01/26/2017   Absent kidney 10/16/2015   Anemia 10/16/2015   Therapeutic drug monitoring 12/11/2014   Scoliosis of lumbar spine 11/08/2014   Fusion of spine of lumbosacral region 11/04/2014   History of Roux-en-Y gastric bypass 11/02/2014     Physical Exam  Triage Vital Signs: ED Triage Vitals  Enc Vitals Group     BP      Pulse      Resp      Temp      Temp src      SpO2      Weight      Height      Head Circumference      Peak Flow      Pain Score      Pain Loc      Pain Edu?      Excl. in [place]?     Most recent vital signs: Vitals:   02/09/22 2013 02/09/22 2033  BP:  (!) 142/83  Pulse: 98 100  Resp: 19 (!) 22  Temp:    SpO2: 99% 100%     General: Awake, no distress.  CV:  Good peripheral perfusion.  Resp:  Normal effort.  Abd:  No distention.  Neuro:             Awake, Alert, moving frequently on the stretcher Other:  Patient has difficulty concentrating on orientation questions and frequently  forgets what have asked her, knows she is in the hospital does not know what hospital or what city, does not remember the year does know her name and her friend's name [id number], nml speech  PERRL, EOMI, face symmetric, nml tongue movement  5/5 strength in the BL upper and lower extremities  Sensation grossly intact in the BL upper and lower extremities  Finger-nose-finger intact BL    ED Results / Procedures / Treatments  Labs (all labs ordered are listed, but only abnormal results are displayed) Labs Reviewed  COMPREHENSIVE METABOLIC PANEL - Abnormal; Notable for the following components:      Result Value   Sodium 123 (*)    Chloride 84 (*)    CO2 20 (*)    Glucose, Bld 112 (*)    Anion gap 19 (*)    All other components within normal limits  CBC WITH DIFFERENTIAL/PLATELET - Abnormal; Notable for the following components:   Platelets 420 (*)    Neutro Abs 8.2 (*)    All other components within normal limits  MAGNESIUM - Abnormal; Notable for the following components:   Magnesium 1.5 (*)    All other components within normal limits  URINALYSIS, ROUTINE W REFLEX MICROSCOPIC - Abnormal; Notable for the following components:   Color, Urine STRAW (*)    APPearance CLEAR (*)    Protein, ur 30 (*)    Bacteria, UA RARE (*)    All other components within normal limits  ETHANOL - Abnormal; Notable for the following components:   Alcohol, Ethyl (B) 23 (*)    All other components within normal limits  LACTIC ACID, PLASMA - Abnormal; Notable for the following components:   Lactic Acid, Venous 2.6 (*)    All other components within normal limits  TSH  PREGNANCY, URINE  URINE DRUG SCREEN, QUALITATIVE ([facility] ONLY)  OSMOLALITY, URINE  SODIUM, URINE, RANDOM  LACTIC ACID, PLASMA  BETA-HYDROXYBUTYRIC ACID  POC URINE PREG, ED     EKG  EKG interpretation performed by myself: NSR, nml axis, nml intervals, no acute ischemic changes   RADIOLOGY I reviewed and interpreted the CT scan of  the brain which does not show any acute intracranial process    PROCEDURES:  Critical Care performed: No  Procedures  The patient is on the cardiac monitor to evaluate for evidence of arrhythmia and/or significant heart rate changes.   MEDICATIONS ORDERED IN ED: Medications  magnesium sulfate IVPB 2 g 50 mL (2 g Intravenous New Bag/Given 02/09/22 2036)  sodium chloride 0.9 % bolus 500 mL (500 mLs Intravenous New Bag/Given 02/09/22 2037)     IMPRESSION / MDM / ASSESSMENT AND PLAN / ED COURSE  I reviewed the triage vital signs and the nursing notes.                              Patient's presentation is most consistent with acute presentation with potential threat to life or bodily function.  Differential diagnosis includes, but is not limited to, hyponatremia, intoxication, intracranial hemorrhage, CVA, UTI  The patient is a 56 year old female with prior recent episode of hyponatremia causing altered mental status presents with confusion.  She is accompanied by her friend who notes that she is having difficulty processing and is confused since yesterday.  Patient has difficulty paying attention and focusing to answer questions.  She answers yes or no questions and her review of systems is essentially negative.  She says she was drinking some beer yesterday denies daily alcohol use and denies drug use.  Her neurologic exam is nonfocal other than her mental status globally being confused.  She is mildly tachycardic and hypertensive.  Normal temp and sat.  Labs are notable for hyponatremia with sodium 123, similar to presentation before when it was 120.  Reviewed the discharge summary and still somewhat unsure if this was due to hypovolemia versus SIADH.  She had been given just a liter of saline in the ED and was started on salt tabs and  her sodium corrected rather rapidly and then stayed stable.  I see that she did have an outpatient sodium done last month that was normal.  We will send  urine studies.  She also has an anion gap without ketones in her urine.  We will send lactate.  We will give just a small 500 cc bolus of fluid and recheck sodium to see which direction is going.  If this is truly SIADH then better treatment will be to fluid restrict and give salt tabs.  If there is component of hypovolemia and saline will be appropriate.  Does not appear volume overloaded.  Knee exam is also low and will supplement.  Considered hypertonic saline but given she is not extremely altered and it is not less than 120 feel that this can be deferred at this time.  Patient will require admission.   Clinical Course as of 02/09/22 2058  Tue Feb 09, 2022  1905 Resp: 18 [KM]    Clinical Course User Index [KM] Rada Hay, MD     FINAL CLINICAL IMPRESSION(S) / ED DIAGNOSES   Final diagnoses:  Hyponatremia     Rx / DC Orders   ED Discharge Orders     None        Note:  This document was prepared using Dragon voice recognition software and may include unintentional dictation errors.   Rada Hay, MD 02/09/22 [phone]

## 2022-02-09 NOTE — Assessment & Plan Note (Signed)
Stable 2.8 cm right middle cranial fossa arachnoid cyst again noted on CT imaging.  Follows with neurology, Dr. Jennings Books.

## 2022-02-09 NOTE — H&P (Signed)
History and Physical    Shelby Colon QIO:[id number] DOB: 01-25-1966 DOA: 02/09/2022  PCP: Carollee Leitz, MD  Patient coming from: Home  I have personally briefly reviewed patient's old medical records in [place]  Chief Complaint: Altered mental status  HPI: Shelby Colon is a 56 y.o. female with medical history significant for hyponatremia (hypovolemic versus SIADH), right middle cranial fossa arachnoid cyst, congenital absence of right kidney, chronic musculoskeletal pain, alcohol use, depression who presented to the ED for evaluation of altered mental status.  History is somewhat difficult to obtain from patient due to inattention and tangential speech.  Patient states that she has been having increased urinary frequency without dysuria for the last 2 days.  She has not had any nausea, vomiting, abdominal pain, chest pain, dyspnea.  She says she lives with her husband who is out of town and is currently accompanied by her friend.  She reports occasional marijuana use in the form of edibles.  She denies any recent alcohol use despite elevated serum ethanol level.  She denies any increased fluid intake or feelings of dehydration.  ED Course  Labs/Imaging on admission: I have personally reviewed following labs and imaging studies.  Initial vitals showed BP 142/83, pulse 105, RR 22, temp 99.0 F, SPO2 98% on room air.  Labs show sodium 123, potassium 3.7, magnesium 1.5, chloride 84, bicarb 20, BUN 10, creatinine 0.67, serum glucose 112, LFTs within normal limits, WBC 9.7, hemoglobin 12.0, platelets 420,000, TSH 3.135.  Serum ethanol 23.  UDS positive for cannabinoids.  Urine pregnancy negative.  Urinalysis shows negative ketones, 30 protein, negative nitrites, negative leukocytes, 0-5 RBCs and WBCs, rare bacteria microscopy.  Urine sodium 81, urine osmolality in process.  Lactic acid 2.6, beta hydroxybutyrate 0.15.  CT head without contrast negative for acute intracranial  abnormality.  Stable 2.8 cm right middle cranial fossa arachnoid cyst noted.  Patient was given 500 cc normal saline, IV magnesium 2 g.  The hospitalist service was consulted to admit for further evaluation and management.  Review of Systems: All systems reviewed and are negative except as documented in history of present illness above.   Past Medical History:  Diagnosis Date   Abnormal MRI, cervical spine 10/17/2019   FINDINGS: Alignment: Reversal of the normal cervical lordosis with apex at C3-4. Trace retrolisthesis of C4 on C5, C5 on C6, and C6 on C7, with anterolisthesis of C7 on T1. Findings chronic and facet mediated.   Vertebrae: Vertebral body height maintained without evidence for acute or chronic fracture. Bone marrow signal intensity within normal limits. No discrete or worrisome osseous lesions. Pro   Abnormal Pap smear of cervix    h/o LSIL pap with HPV +; 01/2019 pap negative negative HPV   Altered mental status 12/08/2021   Annual physical exam 06/12/2020   Anxiety    Anxiety 06/12/2020   Atrial fibrillation ([place])    B12 deficiency    Cardiac murmur 02/14/2018   Chronic knee pain (Bilateral) 09/07/2018   09/21/2018 left total knee ortho [place] [facility] OP    Chronic pain syndrome    Colon polyps    Congenital absence of right kidney    Degeneration of lumbar intervertebral disc    Depression    Early menopause    Elevated alkaline phosphatase level 02/23/2019   Gallstones    Gastric bypass status for obesity    2013, weight 258lb   Headache    Heart murmur    MVP  History of chicken pox    HPV in female 11/30/2017   Hypercholesterolemia    Hypertension    Iron deficiency    Iron deficiency anemia 03/02/2019   Neuropathy    Rheumatoid arthritis ([place])    as child    Scoliosis of lumbar spine    Solitary kidney, congenital    pt thinks has left kidney only no right kidney   Thyroid disease    in the past    UTI (urinary tract infection)     Vitamin D deficiency     Past Surgical History:  Procedure Laterality Date   ANTERIOR CERVICAL DECOMPRESSION/DISCECTOMY FUSION 4 LEVELS N/A 11/28/2019   Procedure: ANTERIOR CERVICAL DECOMPRESSION/DISCECTOMY FUSION 4 LEVELS C3-7;  Surgeon: Meade Maw, MD;  Location: [facility] ORS;  Service: Neurosurgery;  Laterality: N/A;   AUGMENTATION MAMMAPLASTY Bilateral 2009   BACK SURGERY  11/04/2014   fusion of spine of lumbosacral region 2 rods and 16 screws    BREAST ENHANCEMENT SURGERY     COLONOSCOPY WITH PROPOFOL N/A 01/06/2018   Procedure: COLONOSCOPY WITH PROPOFOL;  Surgeon: Virgel Manifold, MD;  Location: [facility] ENDOSCOPY;  Service: Endoscopy;  Laterality: N/A;   COLONOSCOPY WITH PROPOFOL N/A 03/19/2019   Procedure: COLONOSCOPY WITH PROPOFOL;  Surgeon: Virgel Manifold, MD;  Location: [facility] ENDOSCOPY;  Service: Endoscopy;  Laterality: N/A;   excess skin removal     KNEE ARTHROSCOPY Bilateral    x 7 knees b/l (2 surgeries on right knee) total 1 bone graft and 5 arthroscopy total knee    ROUX-EN-Y PROCEDURE     TOTAL KNEE ARTHROPLASTY     left 09/21/18 [name] [name]   TOTAL KNEE ARTHROPLASTY     right knee Dr. Lavonia Drafts     Social History:  reports that she has never smoked. She has never used smokeless tobacco. She reports current alcohol use. She reports that she does not use drugs.  Allergies  Allergen Reactions   Morphine Nausea And Vomiting    Family History  Adopted: Yes  Problem Relation Age of Onset   Arthritis Mother    Drug abuse Mother        heriod OD   Early death Mother    Diabetes Father    Hypertension Father    Depression Father    Arthritis Father    Drug abuse Father    Diabetes Sister    Fibromyalgia Sister    Rashes / Skin problems Sister        PSORIASIS   Diabetes Brother    Osteoarthritis Maternal Grandmother    Aneurysm Maternal Aunt    Thyroid disease Maternal Aunt    Fibromyalgia Maternal Aunt    Breast  cancer Maternal Aunt    Cancer Maternal Aunt        breast cancer      Prior to Admission medications   Medication Sig Start Date End Date Taking? Authorizing Provider  DULoxetine (CYMBALTA) 60 MG capsule Take 60 mg by mouth daily. 11/21/21  Yes [provider]  pregabalin (LYRICA) 100 MG capsule Take 1 capsule (100 mg total) by mouth at bedtime. 50 mg bid, 150 mg qhs Patient taking differently: Take 100 mg by mouth at bedtime. 50 mg tid, 100 mg qhs 12/18/21  Yes McLean-Scocuzza, Nino Glow, MD  pregabalin (LYRICA) 50 MG capsule TAKE ONE CAPSULE BY MOUTH TWICE A DAY AND TAKE ONE CAPSULE BY MOUTH AT BEDTIME Patient taking differently: Take 50 mg by mouth 3 (three)  times daily. TAKE ONE CAPSULE BY MOUTH TWICE A DAY AND TAKE ONE CAPSULE BY MOUTH AT BEDTIME 01/18/22  Yes Dutch Quint B, FNP  methocarbamol (ROBAXIN) 500 MG tablet Take 1 tablet (500 mg total) by mouth every 8 (eight) hours as needed for muscle spasms. 02/02/22   [name], PA-C    Physical Exam: Vitals:   02/09/22 1900 02/09/22 2013 02/09/22 2033 02/09/22 2100  BP:   (!) 142/83   Pulse: (!) 104 98 100   Resp: (!) 37 19 (!) 22   Temp:    98.9 F (37.2 C)  TempSrc:    Oral  SpO2: 98% 99% 100%   Weight:       Constitutional: Resting in bed, NAD, calm, comfortable Eyes: PERRL, EOMI, lids and conjunctivae normal ENMT: Mucous membranes are moist. Posterior pharynx clear of any exudate or lesions.Normal dentition.  Neck: normal, supple, no masses. Respiratory: clear to auscultation bilaterally, no wheezing, no crackles. Normal respiratory effort. No accessory muscle use.  Cardiovascular: Mild tachycardia, no murmurs / rubs / gallops. No extremity edema. 2+ pedal pulses. Abdomen: no tenderness, no masses palpated. Musculoskeletal: no clubbing / cyanosis. No joint deformity upper and lower extremities. Good ROM, no contractures. Normal muscle tone.  Skin: no rashes, lesions, ulcers. No induration Neurologic: CN 2-12 grossly  intact. Sensation intact. Strength 5/5 in all 4.  Psychiatric: Alert and oriented to self, place, year.  Tangential and circumferential speech.  EKG: Personally reviewed. Sinus tachycardia, rate 102, no acute ischemic changes.  Rate is faster when compared to prior.  Assessment/Plan Principal Problem:   Hyponatremia Active Problems:   Altered mental status   Hypomagnesemia   Anxiety and depression   Arachnoid cyst   Alcohol use   Shelby Colon is a 56 y.o. female with medical history significant for hyponatremia (hypovolemic versus SIADH), right middle cranial fossa arachnoid cyst, congenital absence of right kidney, chronic musculoskeletal pain, alcohol use, depression who is admitted with hyponatremia.  Assessment and Plan: * Hyponatremia Recent admit for similar July 2023.  At the time unclear if secondary to SIADH versus hypoosmolar hyponatremia.  Sodium 123 on admission. -S/p 500 cc normal saline -Hold further fluids pending sodium trend -Urine sodium 81, urine osmolality 352 -Obtain serum osmolality -TSH 3.135 -Obtain a.m. cortisol level with ACTH stim test -Hold Cymbalta  Altered mental status Presenting with inattention and circumferential/tangential speech.  Otherwise awake, alert, oriented to self, place, year.  May be due to hyponatremia and/or alcohol use.  No signs/symptoms of infection.  CT head shows stable arachnoid cyst without acute changes.  Hypomagnesemia IV supplement given.  Repeat labs in AM.  Alcohol use Serum ethanol elevated on admission.  Patient denies any recent alcohol use. -Continue CIWA checks  Arachnoid cyst Stable 2.8 cm right middle cranial fossa arachnoid cyst again noted on CT imaging.  Follows with neurology, Dr. Jennings Books.  Anxiety and depression Hold Cymbalta with hyponatremia.  DVT prophylaxis: enoxaparin (LOVENOX) injection 40 mg Start: 02/09/22 2200 Code Status: Full code Family Communication: Friend at bedside Disposition  Plan: From home and likely discharge to home pending clinical progress Consults called: None Severity of Illness: The appropriate patient status for this patient is OBSERVATION. Observation status is judged to be reasonable and necessary in order to provide the required intensity of service to ensure the patient's safety. The patient's presenting symptoms, physical exam findings, and initial radiographic and laboratory data in the context of their medical condition is felt to place them at decreased risk  for further clinical deterioration. Furthermore, it is anticipated that the patient will be medically stable for discharge from the hospital within 2 midnights of admission.   Zada Finders MD [facility]  If 7PM-7AM, please contact night-coverage [email or web address]  02/09/2022, 10:04 PM

## 2022-02-09 NOTE — Assessment & Plan Note (Signed)
-  Followed by Neurology. °

## 2022-02-09 NOTE — Hospital Course (Signed)
Shelby Colon is a 56 y.o. female with medical history significant for hyponatremia (hypovolemic versus SIADH), right middle cranial fossa arachnoid cyst, congenital absence of right kidney, chronic musculoskeletal pain, alcohol use, depression who is admitted with hyponatremia.

## 2022-02-09 NOTE — Assessment & Plan Note (Addendum)
Followed by Endocrinology

## 2022-02-09 NOTE — Assessment & Plan Note (Signed)
IV supplement given.  Repeat labs in AM.

## 2022-02-10 ENCOUNTER — Telehealth: Payer: Self-pay | Admitting: Family Medicine

## 2022-02-10 DIAGNOSIS — E871 Hypo-osmolality and hyponatremia: Secondary | ICD-10-CM | POA: Diagnosis not present

## 2022-02-10 LAB — BASIC METABOLIC PANEL
Anion gap: 8 (ref 5–15)
BUN: 11 mg/dL (ref 6–20)
CO2: 25 mmol/L (ref 22–32)
Calcium: 9 mg/dL (ref 8.9–10.3)
Chloride: 96 mmol/L — ABNORMAL LOW (ref 98–111)
Creatinine, Ser: 0.73 mg/dL (ref 0.44–1.00)
GFR, Estimated: >60 mL/min (ref >60)
Glucose, Bld: 105 mg/dL — ABNORMAL HIGH (ref 70–99)
Potassium: 3.5 mmol/L (ref 3.5–5.1)
Sodium: 129 mmol/L — ABNORMAL LOW (ref 135–145)

## 2022-02-10 LAB — CBC
HCT: 35.1 % — ABNORMAL LOW (ref 36.0–46.0)
Hemoglobin: 11.7 g/dL — ABNORMAL LOW (ref 12.0–15.0)
MCH: 26.8 pg (ref 26.0–34.0)
MCHC: 33.3 g/dL (ref 30.0–36.0)
MCV: 80.3 fL (ref 80.0–100.0)
Platelets: 403 10*3/uL — ABNORMAL HIGH (ref 150–400)
RBC: 4.37 MIL/uL (ref 3.87–5.11)
RDW: 12.1 % (ref 11.5–15.5)
WBC: 6.6 10*3/uL (ref 4.0–10.5)
nRBC: 0 % (ref 0.0–0.2)

## 2022-02-10 LAB — MAGNESIUM: Magnesium: 2.4 mg/dL (ref 1.7–2.4)

## 2022-02-10 LAB — CORTISOL-AM, BLOOD: Cortisol - AM: 12.2 ug/dL (ref 6.7–22.6)

## 2022-02-10 LAB — ACTH STIMULATION, 3 TIME POINTS
Cortisol, 30 Min: 30.9 ug/dL
Cortisol, 60 Min: 39.5 ug/dL
Cortisol, Base: 11.6 ug/dL

## 2022-02-10 LAB — SODIUM: Sodium: 128 mmol/L — ABNORMAL LOW (ref 135–145)

## 2022-02-10 MED ORDER — PREGABALIN 50 MG PO CAPS
50.0000 mg | ORAL_CAPSULE | Freq: Three times a day (TID) | ORAL | Status: DC
  Administered 2022-02-10: 50 mg via ORAL
  Filled 2022-02-10: qty 1

## 2022-02-10 MED ORDER — AMLODIPINE BESYLATE 5 MG PO TABS: 5.0000 mg | ORAL_TABLET | Freq: Every day | ORAL | 1 refills | Status: DC

## 2022-02-10 MED ORDER — PREGABALIN 50 MG PO CAPS
100.0000 mg | ORAL_CAPSULE | Freq: Every day | ORAL | Status: DC
  Administered 2022-02-10: 100 mg via ORAL
  Filled 2022-02-10: qty 2

## 2022-02-10 MED ORDER — SODIUM CHLORIDE 1 G PO TABS: 1.0000 g | ORAL_TABLET | Freq: Three times a day (TID) | ORAL | 0 refills | Status: DC

## 2022-02-10 MED ORDER — BLOOD PRESSURE KIT: 1.0000 | PACK | Freq: Two times a day (BID) | 0 refills | Status: DC

## 2022-02-10 NOTE — Plan of Care (Signed)
IV removed discharge instructions reviewed, and patient discharged to home

## 2022-02-10 NOTE — Discharge Summary (Addendum)
Physician Discharge Summary  Shelby Colon IEP:[id number] DOB: 04/04/1966 DOA: 02/09/2022  PCP: Carollee Leitz, MD  Admit date: 02/09/2022 Discharge date: 02/10/2022  Admitted From: Home Disposition:  Home  Discharge Condition:Stable CODE STATUS:FULL, DNR, Comfort Care Diet recommendation: Heart Healthy / Carb Modified / Regular / Dysphagia   Brief/Interim Summary: Shelby Colon is a 56 y.o. female with medical history significant for hyponatremia (hypovolemic versus SIADH), right middle cranial fossa arachnoid cyst, congenital absence of right kidney, chronic musculoskeletal pain, alcohol use, depression who presented to the ED for evaluation of altered mental status.  On presentation she was hemodynamically stable.  Lab work showed sodium of 123.  Serum ethanol of 23.  UDS positive for cannabinoids.  CT head did not show any acute intracranial findings.  Patient was started on gentle IV fluids and admitted for management of hyponatremia.  This morning her mental status has completely normalized, she is alert and oriented.  Sodium is 129.  Patient is very eager to go home.  Medically stable for discharge.  Following problems were addressed during her hospitalization:  Hyponatremia Recent admit for similar July 2023.  At the time unclear if secondary to SIADH versus hypoosmolar hyponatremia.  Sodium 123 on admission. -S/p 500 cc normal saline -Urine sodium 81, urine osmolality 352 -TSH 3.135 -Pending cortisol level with ACTH stim test.  She already follows with endocrinologist and had done  these tests recently which were normal -We still think that the hyponatremia is secondary to decreased solute intake: May be from excessive water intake , concurrent alcohol use( beer) -Long discussion held with the patient about limiting excess free water intake, limiting alcohol use or preferably completely stopping it -Follow-up with PCP in a week and do a BMP test to check sodium level   Altered mental  status Now alert and oriented CT head shows stable arachnoid cyst without acute changes.   Hypomagnesemia Demented and corrected   Alcohol use Serum ethanol elevated on admission.  Patient states she drinks 2-3 bottles of beer every 2 to 3 days.  Counseled for cessation  arachnoid cyst Stable 2.8 cm right middle cranial fossa arachnoid cyst again noted on CT imaging.  Follows with neurology, Dr. Jennings Books.   Anxiety and depression On Cymbalta  HTN: Does not take any medication at home.  Blood pressure consistently on the higher range here.  Started on amlodipine 5 mg daily  Discharge Diagnoses:  Principal Problem:   Hyponatremia Active Problems:   Altered mental status   Hypomagnesemia   Anxiety and depression   Arachnoid cyst   Alcohol use    Discharge Instructions  Discharge Instructions     Diet general   Complete by: As directed    Discharge instructions   Complete by: As directed    1)Please take prescribed medications as instructed 2)Follow up with your PCP in a week. do a BMP to check your sodium level during the follow-up 3)We have noticed that your blood pressure is on the higher range.  We have started you on amlodipine 5 mg daily.  Monitor blood pressure at home.   Increase activity slowly   Complete by: As directed       Allergies as of 02/10/2022       Reactions   Morphine Nausea And Vomiting        Medication List     TAKE these medications    amLODipine 5 MG tablet Commonly known as: NORVASC Take 1 tablet (5 mg total) by mouth daily.  Blood Pressure Kit 1 each by Does not apply route in the morning and at bedtime.   DULoxetine 60 MG capsule Commonly known as: CYMBALTA Take 60 mg by mouth daily.   methocarbamol 500 MG tablet Commonly known as: ROBAXIN Take 1 tablet (500 mg total) by mouth every 8 (eight) hours as needed for muscle spasms.   pregabalin 100 MG capsule Commonly known as: LYRICA Take 1 capsule (100 mg total) by  mouth at bedtime. 50 mg bid, 150 mg qhs What changed: additional instructions   pregabalin 50 MG capsule Commonly known as: LYRICA TAKE ONE CAPSULE BY MOUTH TWICE A DAY AND TAKE ONE CAPSULE BY MOUTH AT BEDTIME What changed:  how much to take how to take this when to take this   sodium chloride 1 g tablet Take 1 tablet (1 g total) by mouth 3 (three) times daily with meals for 3 days.        Follow-up Information     Carollee Leitz, MD. Schedule an appointment as soon as possible for a visit in 1 week(s).   Specialty: Family Medicine Contact information: [place] [address] [phone]                Allergies  Allergen Reactions   Morphine Nausea And Vomiting    Consultations: None   Procedures/Studies: CT HEAD WO CONTRAST (5MM)  Result Date: 02/09/2022 CLINICAL DATA:  Mental status change EXAM: CT HEAD WITHOUT CONTRAST TECHNIQUE: Contiguous axial images were obtained from the base of the skull through the vertex without intravenous contrast. RADIATION DOSE REDUCTION: This exam was performed according to the departmental dose-optimization program which includes automated exposure control, adjustment of the mA and/or kV according to patient size and/or use of iterative reconstruction technique. COMPARISON:  MRI 12/10/2021 and CT head 12/08/2021 FINDINGS: Brain: No intracranial hemorrhage, mass effect, or evidence of acute infarct. No hydrocephalus. No extra-axial fluid collection. Stable right middle cranial fossa arachnoid cyst measuring 2.8 x 1.5 cm Vascular: No hyperdense vessel or unexpected calcification. Skull: No fracture or focal lesion. Sinuses/Orbits: No acute finding. Paranasal sinuses and mastoid air cells are well aerated. Other: None. IMPRESSION: 1. No acute intracranial abnormality. 2. Stable 2.8 cm arachnoid cyst overlying the lateral right temporal. Electronically Signed   By: Placido Sou M.D.   On: 02/09/2022 19:47       Subjective: Patient seen and examined at the bedside today.  Hemodynamically stable for discharge.  Discharge Exam: Vitals:   02/10/22 0415 02/10/22 0752  BP: (!) 147/88 (!) 153/99  Pulse: 82 78  Resp: 20 18  Temp: 98.9 F (37.2 C) 98.6 F (37 C)  SpO2: 98% 99%   Vitals:   02/09/22 2100 02/09/22 2246 02/10/22 0415 02/10/22 0752  BP:  (!) 165/99 (!) 147/88 (!) 153/99  Pulse:  88 82 78  Resp:  $Remo'20 20 18  'nsbvL$ Temp: 98.9 F (37.2 C) 98.3 F (36.8 C) 98.9 F (37.2 C) 98.6 F (37 C)  TempSrc: Oral  Oral Oral  SpO2:  98% 98% 99%  Weight:        General: Pt is alert, awake, not in acute distress Cardiovascular: RRR, S1/S2 +, no rubs, no gallops Respiratory: CTA bilaterally, no wheezing, no rhonchi Abdominal: Soft, NT, ND, bowel sounds + Extremities: no edema, no cyanosis    The results of significant diagnostics from this hospitalization (including imaging, microbiology, ancillary and laboratory) are listed below for reference.     Microbiology: No results found for this or  any previous visit (from the past 240 hour(s)).   Labs: BNP (last 3 results) No results for input(s): "BNP" in the last 8760 hours. Basic Metabolic Panel: Recent Labs  Lab 02/09/22 1828 02/10/22 0142 02/10/22 0448  NA 123* 128* 129*  K 3.7  --  3.5  CL 84*  --  96*  CO2 20*  --  25  GLUCOSE 112*  --  105*  BUN 10  --  11  CREATININE 0.67  --  0.73  CALCIUM 9.2  --  9.0  MG 1.5*  --  2.4   Liver Function Tests: Recent Labs  Lab 02/09/22 1828  AST 41  ALT 28  ALKPHOS 103  BILITOT 1.2  PROT 7.6  ALBUMIN 4.4   No results for input(s): "LIPASE", "AMYLASE" in the last 168 hours. No results for input(s): "AMMONIA" in the last 168 hours. CBC: Recent Labs  Lab 02/09/22 1828 02/10/22 0448  WBC 9.7 6.6  NEUTROABS 8.2*  --   HGB 12.0 11.7*  HCT 36.9 35.1*  MCV 81.8 80.3  PLT 420* 403*   Cardiac Enzymes: No results for input(s): "CKTOTAL", "CKMB", "CKMBINDEX", "TROPONINI" in  the last 168 hours. BNP: Invalid input(s): "POCBNP" CBG: No results for input(s): "GLUCAP" in the last 168 hours. D-Dimer No results for input(s): "DDIMER" in the last 72 hours. Hgb A1c No results for input(s): "HGBA1C" in the last 72 hours. Lipid Profile No results for input(s): "CHOL", "HDL", "LDLCALC", "TRIG", "CHOLHDL", "LDLDIRECT" in the last 72 hours. Thyroid function studies Recent Labs    02/09/22 1828  TSH 3.135   Anemia work up No results for input(s): "VITAMINB12", "FOLATE", "FERRITIN", "TIBC", "IRON", "RETICCTPCT" in the last 72 hours. Urinalysis    Component Value Date/Time   COLORURINE STRAW (A) 02/09/2022 1828   APPEARANCEUR CLEAR (A) 02/09/2022 1828   APPEARANCEUR Clear 12/18/2021 1425   LABSPEC 1.012 02/09/2022 1828   PHURINE 6.0 02/09/2022 1828   GLUCOSEU NEGATIVE 02/09/2022 1828   HGBUR NEGATIVE 02/09/2022 1828   BILIRUBINUR NEGATIVE 02/09/2022 1828   BILIRUBINUR Negative 12/18/2021 [place] 02/09/2022 1828   PROTEINUR 30 (A) 02/09/2022 1828   NITRITE NEGATIVE 02/09/2022 1828   LEUKOCYTESUR NEGATIVE 02/09/2022 1828   Sepsis Labs Recent Labs  Lab 02/09/22 1828 02/10/22 0448  WBC 9.7 6.6   Microbiology No results found for this or any previous visit (from the past 240 hour(s)).  Please note: You were cared for by a hospitalist during your hospital stay. Once you are discharged, your primary care physician will handle any further medical issues. Please note that NO REFILLS for any discharge medications will be authorized once you are discharged, as it is imperative that you return to your primary care physician (or establish a relationship with a primary care physician if you do not have one) for your post hospital discharge needs so that they can reassess your need for medications and monitor your lab values.    Time coordinating discharge: 40 minutes  SIGNED:   Shelly Coss, MD  [facility] 02/10/2022, 10:09  AM Pager [phone]  If 7PM-7AM, please contact night-coverage [email or web address] Password [email or web address]

## 2022-02-10 NOTE — Telephone Encounter (Signed)
[facility] called today, patient needs a hospital follow up appointment with her provdier. No open slots to book.

## 2022-02-10 NOTE — TOC Initial Note (Signed)
Transition of Care [facility]) - Initial/Assessment Note    Patient Details  Name: Shelby Colon MRN: [id number] Date of Birth: 19-Aug-1965  Transition of Care [facility]) CM/SW Contact:    Beverly Sessions, RN Phone Number: 02/10/2022, 11:13 AM  Clinical Narrative:                   Transition of Care [facility]) Screening Note   Patient Details  Name: Shelby Colon Date of Birth: 06-26-1965   Transition of Care [facility]) CM/SW Contact:    Beverly Sessions, RN Phone Number: 02/10/2022, 11:13 AM    Transition of Care [facility]) has reviewed patient and no TOC needs have been identified at this time. We will continue to monitor patient advancement through interdisciplinary progression rounds. If new patient transition needs arise, please place a TOC consult.         Patient Goals and CMS Choice        Expected Discharge Plan and Services           Expected Discharge Date: 02/10/22                                    Prior Living Arrangements/Services                       Activities of Daily Living Home Assistive Devices/Equipment: None ADL Screening (condition at time of admission) Patient's cognitive ability adequate to safely complete daily activities?: Yes Is the patient deaf or have difficulty hearing?: No Does the patient have difficulty seeing, even when wearing glasses/contacts?: No Does the patient have difficulty concentrating, remembering, or making decisions?: No Patient able to express need for assistance with ADLs?: Yes Does the patient have difficulty dressing or bathing?: No Independently performs ADLs?: Yes (appropriate for developmental age) Communication: Independent Dressing (OT): Independent Grooming: Independent Feeding: Independent Bathing: Independent Toileting: Independent In/Out Bed: Independent Walks in Home: Independent Does the patient have difficulty walking or climbing stairs?: Yes Weakness of Legs: Both Weakness of  Arms/Hands: Both  Permission Sought/Granted                  Emotional Assessment              Admission diagnosis:  Hyponatremia [E87.1] Patient Active Problem List   Diagnosis Date Noted   Alcohol use 02/09/2022   Hypomagnesemia 02/09/2022   SIADH (syndrome of inappropriate ADH production) ([place]) 01/01/2022   Hypocortisolemia ([place]) 01/01/2022   Arachnoid cyst 12/18/2021   Confusion 12/10/2021   Brain cyst 12/10/2021   Hyponatremia 12/08/2021   Altered mental status 12/08/2021   Hypokalemia 12/08/2021   Transaminitis 12/08/2021   History of fusion of cervical spine 06/12/2020   Anxiety 06/12/2020   Depression, recurrent ([place]) 06/12/2020   PTSD (post-traumatic stress disorder) 06/12/2020   Cervical radiculopathy 11/28/2019   Pharmacologic therapy 10/17/2019   Disorder of skeletal system 10/17/2019   Problems influencing health status 10/17/2019   Chronic knee pain after total replacement of both knee joints 10/17/2019   DDD (degenerative disc disease), cervical 10/17/2019   DDD (degenerative disc disease), thoracic 10/17/2019   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   Thoracogenic scoliosis of thoracolumbar region 10/17/2019   Cervical radiculitis 09/28/2019   Loss of balance 09/28/2019   Cervicalgia (1ry area of Pain) (Bilateral) (R>L) 09/28/2019   Stenosis of cervical spine 09/28/2019   Fall 84/66/5993   High cyclic citrullinated  peptide (CCP) antibody level 07/02/2019   Personal history of colonic polyps    Polyp of sigmoid colon    Atrophy of right kidney 03/05/2019   Iron deficiency anemia 03/02/2019   Hip pain 02/28/2019   Low back pain 02/28/2019   H/O gastric bypass 02/23/2019   Iron deficiency 02/23/2019   Thrombocytosis 02/23/2019   History of atrial fibrillation 02/14/2018   Elevated liver enzymes 02/14/2018   Joint pain 02/14/2018   Neuropathy 02/14/2018   Positive colorectal cancer screening using Cologuard test    Benign neoplasm of  ascending colon    Anxiety and depression 11/30/2017   Insomnia 11/30/2017   Postmenopausal 11/30/2017   MVP (mitral valve prolapse) 11/30/2017   Vitamin D deficiency 11/30/2017   HTN (hypertension) 11/30/2017   Osteoarthritis of knee 10/04/2017   History of total knee arthroplasty 09/15/2017   Papanicolaou smear of cervix with positive high risk human papilloma virus (HPV) test 03/26/2017   Hypercholesteremia 02/02/2017   Chronic pain syndrome 01/27/2017   DDD (degenerative disc disease), lumbosacral 01/27/2017   Congenital absence of right kidney 01/26/2017   Absent kidney 10/16/2015   Anemia 10/16/2015   Therapeutic drug monitoring 12/11/2014   Scoliosis of lumbar spine 11/08/2014   Fusion of spine of lumbosacral region 11/04/2014   History of Roux-en-Y gastric bypass 11/02/2014   PCP:  Carollee Leitz, MD Pharmacy:   [id number] - [name], [place] [place] [address] Phone: [phone] Fax: [phone]  [id number] [place], [address] AT [address] [place] [address] Phone: [phone] Fax: [phone]     Social Determinants of Health (SDOH) Interventions    Readmission Risk Interventions     No data to display

## 2022-02-12 ENCOUNTER — Ambulatory Visit: Payer: Medicare HMO | Attending: Cardiology | Admitting: Cardiology

## 2022-02-12 ENCOUNTER — Telehealth: Payer: Self-pay

## 2022-02-12 ENCOUNTER — Encounter: Payer: Self-pay | Admitting: Cardiology

## 2022-02-12 ENCOUNTER — Ambulatory Visit (INDEPENDENT_AMBULATORY_CARE_PROVIDER_SITE_OTHER): Payer: Medicare HMO

## 2022-02-12 VITALS — BP 110/62 | HR 90 | Ht 62.5 in | Wt 136.0 lb

## 2022-02-12 DIAGNOSIS — R002 Palpitations: Secondary | ICD-10-CM

## 2022-02-12 DIAGNOSIS — Z8679 Personal history of other diseases of the circulatory system: Secondary | ICD-10-CM | POA: Diagnosis not present

## 2022-02-12 DIAGNOSIS — R0600 Dyspnea, unspecified: Secondary | ICD-10-CM

## 2022-02-12 NOTE — Telephone Encounter (Signed)
Yes, that's fine 

## 2022-02-12 NOTE — Telephone Encounter (Signed)
She needs to have lab work per the discharge summary so she will need to be seen in clinic

## 2022-02-12 NOTE — Patient Instructions (Signed)
Medication Instructions:    Your physician recommends that you continue on your current medications as directed. Please refer to the Current Medication list given to you today.  *If you need a refill on your cardiac medications before your next appointment, please call your pharmacy*   Testing/Procedures:  Your physician has requested that you have an echocardiogram. Echocardiography is a painless test that uses sound waves to create images of your heart. It provides your doctor with information about the size and shape of your heart and how well your heart's chambers and valves are working. This procedure takes approximately one hour. There are no restrictions for this procedure.  Your physician has recommended that you wear a Zio XT monitor for 2 weeks. This will be mailed to your home address in 4-5 business days.   Your clinician has requested a Zio heart rhythm monitor by [employer] to be mailed to your home for you to wear for 14 days. You should expect a small box to arrive via USPS (or FedEx in some cases) within this next week. If you do not receive it please call [employer] at [phone].  Closely watching your heart at this time will help your care team understand more and provide information needed to develop your plan of care.  Please apply your Zio patch monitor the day you receive it. Keep this packaging, you will use this to return your Zio monitor.  You will easily be able to apply the monitor with the instructions provided in the Patient Guide.  If you need assistance, [employer] representatives are available 24/7 at [phone].  You can also download the [facility] app on your phone to view detailed application instructions and log symptoms.  After you wear your monitor for 14 days, place it back in the blue box or envelope, along with your Symptom Log.  To send your monitor back: Simply use the pre-addressed and pre-paid box/envelope.  Send it back through [employer] the  same day you remove it via your local post office or by placing it in your mailbox.  As soon as we receive the results, they will be reviewed and your clinician will contact you.  For the first 24 hours- it is essential to not shower or exercise, to allow the patch to adhere to your skin. Avoid excessive sweating to help maximize wear time. Do not submerge the device, no hot tubs, and no swimming pools. Keep any lotions or oils away from the patch. After 24 hours you may shower with the patch on. Take brief showers with your back facing the shower head.  Do not remove patch once it has been placed because that will interrupt data and decrease adhesive wear time. Push the button when you have any symptoms and write down what you were feeling. Once you have completed wearing your monitor, remove and place into box which has postage paid and place in your outgoing mailbox.  If for some reason you have misplaced your box then call our office and we can provide another box and/or mail it off for you.    Follow-Up: At [facility], you and your health needs are our priority.  As part of our continuing mission to provide you with exceptional heart care, we have created designated Provider Care Teams.  These Care Teams include your primary Cardiologist (physician) and Advanced Practice Providers (APPs -  Physician Assistants and Nurse Practitioners) who all work together to provide you with the care you need, when you need  it.  We recommend signing up for the patient portal called "MyChart".  Sign up information is provided on this After Visit Summary.  MyChart is used to connect with patients for Virtual Visits (Telemedicine).  Patients are able to view lab/test results, encounter notes, upcoming appointments, etc.  Non-urgent messages can be sent to your provider as well.   To learn more about what you can do with MyChart, go to [email or web address].    Your next appointment:   6-8  week(s)  The format for your next appointment:   In Person  Provider:   You may see Kate Sable, MD or one of the following Advanced Practice Providers on your designated Care Team:   Murray Hodgkins, NP Christell Faith, PA-C Cadence Kathlen Mody, PA-C Gerrie Nordmann, NP    Other Instructions   Important Information About Sugar

## 2022-02-12 NOTE — Telephone Encounter (Signed)
Noted  

## 2022-02-12 NOTE — Telephone Encounter (Signed)
Patient states she is calling to schedule a hospital follow-up.  Dr. Carollee Leitz only has virtual visits available.  Please let us know how to schedule.

## 2022-02-12 NOTE — Progress Notes (Signed)
Cardiology Office Note:    Date:  02/12/2022   ID:  Shelby Colon, DOB [birth year], MRN [id number]  PCP:  Carollee Leitz, MD   [facility] Cardiologist:  Kate Sable, MD     Referring MD: McLean-Scocuzza, Olivia Mackie *   Chief Complaint  Patient presents with   New Patient (Initial Visit)    Referred by PCP for Abnormal heart Sounds. Patient reports that she was in the ED about 4 days ago for low sodium.  Meds reviewed verbally with patient.     History of Present Illness:    Shelby Colon is a 56 y.o. female with a hx of anxiety, self-reported history of mitral valve prolapse, who presents due to palpitations.  Was seen by PCP couple of months ago, heart rate noted to be irregular.  Patient endorsed having palpitations lasting a few seconds almost daily.  Recently admitted with hyponatremia, deemed SIADH possibly from Cymbalta.  Cymbalta was stopped, patient has noticed shortness of breath since stopping Cymbalta.  Follows up with neurology for this.  She has chronic pain in her back and legs.  Denies smoking, denies history of hypertension, states blood pressure has always been normal.  Was given amlodipine due to systolic of 315X in the hospital, has not taken medication since, blood pressures have been controlled pre-Hospital admit and post hospital discharge.  Past Medical History:  Diagnosis Date   Abnormal MRI, cervical spine 10/17/2019   FINDINGS: Alignment: Reversal of the normal cervical lordosis with apex at C3-4. Trace retrolisthesis of C4 on C5, C5 on C6, and C6 on C7, with anterolisthesis of C7 on T1. Findings chronic and facet mediated.   Vertebrae: Vertebral body height maintained without evidence for acute or chronic fracture. Bone marrow signal intensity within normal limits. No discrete or worrisome osseous lesions. Pro   Abnormal Pap smear of cervix    h/o LSIL pap with HPV +; 01/2019 pap negative negative HPV   Altered mental status 12/08/2021   Annual  physical exam 06/12/2020   Anxiety    Anxiety 06/12/2020   Atrial fibrillation ([place])    B12 deficiency    Cardiac murmur 02/14/2018   Chronic knee pain (Bilateral) 09/07/2018   09/21/2018 left total knee ortho [facility]    Chronic pain syndrome    Colon polyps    Congenital absence of right kidney    Degeneration of lumbar intervertebral disc    Depression    Early menopause    Elevated alkaline phosphatase level 02/23/2019   Gallstones    Gastric bypass status for obesity    2013, weight 258lb   Headache    Heart murmur    MVP   History of chicken pox    HPV in female 11/30/2017   Hypercholesterolemia    Hypertension    Iron deficiency    Iron deficiency anemia 03/02/2019   Neuropathy    Rheumatoid arthritis ([place])    as child    Scoliosis of lumbar spine    Solitary kidney, congenital    pt thinks has left kidney only no right kidney   Thyroid disease    in the past    UTI (urinary tract infection)    Vitamin D deficiency     Past Surgical History:  Procedure Laterality Date   ANTERIOR CERVICAL DECOMPRESSION/DISCECTOMY FUSION 4 LEVELS N/A 11/28/2019   Procedure: ANTERIOR CERVICAL DECOMPRESSION/DISCECTOMY FUSION 4 LEVELS C3-7;  Surgeon: Meade Maw, MD;  Location: [facility] ORS;  Service:  Neurosurgery;  Laterality: N/A;   AUGMENTATION MAMMAPLASTY Bilateral 2009   BACK SURGERY  11/04/2014   fusion of spine of lumbosacral region 2 rods and 16 screws    BREAST ENHANCEMENT SURGERY     COLONOSCOPY WITH PROPOFOL N/A 01/06/2018   Procedure: COLONOSCOPY WITH PROPOFOL;  Surgeon: Virgel Manifold, MD;  Location: [facility] ENDOSCOPY;  Service: Endoscopy;  Laterality: N/A;   COLONOSCOPY WITH PROPOFOL N/A 03/19/2019   Procedure: COLONOSCOPY WITH PROPOFOL;  Surgeon: Virgel Manifold, MD;  Location: [facility] ENDOSCOPY;  Service: Endoscopy;  Laterality: N/A;   excess skin removal     KNEE ARTHROSCOPY Bilateral    x 7 knees b/l (2 surgeries on right knee)  total 1 bone graft and 5 arthroscopy total knee    ROUX-EN-Y PROCEDURE     TOTAL KNEE ARTHROPLASTY     left 09/21/18 [name] [name]   TOTAL KNEE ARTHROPLASTY     right knee Dr. Lavonia Drafts     Current Medications: Current Meds  Medication Sig   Blood Pressure KIT 1 each by Does not apply route in the morning and at bedtime.   methocarbamol (ROBAXIN) 500 MG tablet Take 1 tablet (500 mg total) by mouth every 8 (eight) hours as needed for muscle spasms.   pregabalin (LYRICA) 100 MG capsule Take 1 capsule (100 mg total) by mouth at bedtime. 50 mg bid, 150 mg qhs   pregabalin (LYRICA) 50 MG capsule TAKE ONE CAPSULE BY MOUTH TWICE A DAY AND TAKE ONE CAPSULE BY MOUTH AT BEDTIME   [DISCONTINUED] amLODipine (NORVASC) 5 MG tablet Take 1 tablet (5 mg total) by mouth daily.     Allergies:   Morphine   Social History   Socioeconomic History   Marital status: Married    Spouse name: Not on file   Number of children: Not on file   Years of education: Not on file   Highest education level: Not on file  Occupational History   Not on file  Tobacco Use   Smoking status: Never   Smokeless tobacco: Never  Vaping Use   Vaping Use: Never used  Substance and Sexual Activity   Alcohol use: Yes    Comment: socially   Drug use: No   Sexual activity: Yes  Other Topics Concern   Not on file  Social History Narrative   Married since 2012    No kids    adopted   [facility] moved to [facility] and now lives here    Currently unemployed used to be [place] unemployed since 04/2018    BA degree    No guns, wears seat belt, safe in relationship    Social Determinants of Health   Financial Resource Strain: Not on file  Food Insecurity: No Food Insecurity (02/10/2022)   Hunger Vital Sign    Worried About Running Out of Food in the Last Year: Never true    [place] in the Last Year: Never true  Transportation Needs: No  Transportation Needs (02/10/2022)   PRAPARE - Hydrologist (Medical): No    Lack of Transportation (Non-Medical): No  Physical Activity: Not on file  Stress: Not on file  Social Connections: Not on file     Family History: The patient's family history includes Aneurysm in her maternal aunt; Arthritis in her father and mother; Breast cancer in her maternal aunt; Cancer in her maternal aunt; Depression in her father;  Diabetes in her brother, father, and sister; Drug abuse in her father and mother; Early death in her mother; Fibromyalgia in her maternal aunt and sister; Hypertension in her father; Osteoarthritis in her maternal grandmother; Rashes / Skin problems in her sister; Thyroid disease in her maternal aunt. She was adopted.  ROS:   Please see the history of present illness.     All other systems reviewed and are negative.  EKGs/Labs/Other Studies Reviewed:    The following studies were reviewed today:   EKG:  EKG is  ordered today.  The ekg ordered today demonstrates normal sinus rhythm, LVH by voltage criteria  Recent Labs: 02/09/2022: ALT 28; TSH 3.135 02/10/2022: BUN 11; Creatinine, Ser 0.73; Hemoglobin 11.7; Magnesium 2.4; Platelets 403; Potassium 3.5; Sodium 129  Recent Lipid Panel    Component Value Date/Time   CHOL 184 02/15/2019 1004   TRIG 55 02/15/2019 1004   HDL 117 02/15/2019 1004   CHOLHDL 1.6 02/15/2019 1004   CHOLHDL 1.8 12/05/2017 1044   LDLCALC 56 02/15/2019 1004   LDLCALC 74 12/05/2017 1044     Risk Assessment/Calculations:             Physical Exam:    VS:  BP 110/62 (BP Location: Left Arm, Patient Position: Sitting, Cuff Size: Normal)   Pulse 90   Ht 5' 2.5" (1.588 m)   Wt 136 lb (61.7 kg)   SpO2 98%   BMI 24.48 kg/m     Wt Readings from Last 3 Encounters:  02/12/22 136 lb (61.7 kg)  02/09/22 140 lb (63.5 kg)  02/03/22 140 lb 9.6 oz (63.8 kg)     GEN:  Well nourished, well developed in no acute  distress HEENT: Normal NECK: No JVD; No carotid bruits CARDIAC: RRR, no murmurs, rubs, gallops RESPIRATORY:  Clear to auscultation without rales, wheezing or rhonchi  ABDOMEN: Soft, non-tender, non-distended MUSCULOSKELETAL:  No edema; No deformity  SKIN: Warm and dry NEUROLOGIC:  Alert and oriented x 3 PSYCHIATRIC:  Normal affect   ASSESSMENT:    1. Palpitations   2. Dyspnea, unspecified type   3. Hx of mitral valve prolapse    PLAN:    In order of problems listed above:  Palpitations, place cardiac monitor to evaluate any significant arrhythmias.  BP normal, no need to take Norvasc. Dyspnea, appears atypical, consistent with angina.worsening after stopping Cymbalta.  Get echocardiogram to evaluate any structural abnormalities.   Self-reported history of mitral valve prolapse, evaluate mitral valve with echo as above.  Follow-up after echo and cardiac monitor      Medication Adjustments/Labs and Tests Ordered: Current medicines are reviewed at length with the patient today.  Concerns regarding medicines are outlined above.  Orders Placed This Encounter  Procedures   LONG TERM MONITOR (3-14 DAYS)   EKG 12-Lead   ECHOCARDIOGRAM COMPLETE   No orders of the defined types were placed in this encounter.   Patient Instructions  Medication Instructions:    Your physician recommends that you continue on your current medications as directed. Please refer to the Current Medication list given to you today.  *If you need a refill on your cardiac medications before your next appointment, please call your pharmacy*   Testing/Procedures:  Your physician has requested that you have an echocardiogram. Echocardiography is a painless test that uses sound waves to create images of your heart. It provides your doctor with information about the size and shape of your heart and how well your heart's chambers and valves are  working. This procedure takes approximately one hour. There are no  restrictions for this procedure.  Your physician has recommended that you wear a Zio XT monitor for 2 weeks. This will be mailed to your home address in 4-5 business days.   Your clinician has requested a Zio heart rhythm monitor by [employer] to be mailed to your home for you to wear for 14 days. You should expect a small box to arrive via USPS (or FedEx in some cases) within this next week. If you do not receive it please call [employer] at [phone].  Closely watching your heart at this time will help your care team understand more and provide information needed to develop your plan of care.  Please apply your Zio patch monitor the day you receive it. Keep this packaging, you will use this to return your Zio monitor.  You will easily be able to apply the monitor with the instructions provided in the Patient Guide.  If you need assistance, [employer] representatives are available 24/7 at [phone].  You can also download the [facility] app on your phone to view detailed application instructions and log symptoms.  After you wear your monitor for 14 days, place it back in the blue box or envelope, along with your Symptom Log.  To send your monitor back: Simply use the pre-addressed and pre-paid box/envelope.  Send it back through [employer] the same day you remove it via your local post office or by placing it in your mailbox.  As soon as we receive the results, they will be reviewed and your clinician will contact you.  For the first 24 hours- it is essential to not shower or exercise, to allow the patch to adhere to your skin. Avoid excessive sweating to help maximize wear time. Do not submerge the device, no hot tubs, and no swimming pools. Keep any lotions or oils away from the patch. After 24 hours you may shower with the patch on. Take brief showers with your back facing the shower head.  Do not remove patch once it has been placed because that will interrupt data and decrease adhesive  wear time. Push the button when you have any symptoms and write down what you were feeling. Once you have completed wearing your monitor, remove and place into box which has postage paid and place in your outgoing mailbox.  If for some reason you have misplaced your box then call our office and we can provide another box and/or mail it off for you.    Follow-Up: At [facility], you and your health needs are our priority.  As part of our continuing mission to provide you with exceptional heart care, we have created designated Provider Care Teams.  These Care Teams include your primary Cardiologist (physician) and Advanced Practice Providers (APPs -  Physician Assistants and Nurse Practitioners) who all work together to provide you with the care you need, when you need it.  We recommend signing up for the patient portal called "MyChart".  Sign up information is provided on this After Visit Summary.  MyChart is used to connect with patients for Virtual Visits (Telemedicine).  Patients are able to view lab/test results, encounter notes, upcoming appointments, etc.  Non-urgent messages can be sent to your provider as well.   To learn more about what you can do with MyChart, go to [email or web address].    Your next appointment:   6-8 week(s)  The format for your next appointment:   In Person  Provider:   You may see Kate Sable, MD or one of the following Advanced Practice Providers on your designated Care Team:   Murray Hodgkins, NP Christell Faith, PA-C Cadence Kathlen Mody, PA-C Gerrie Nordmann, NP    Other Instructions   Important Information About Sugar         Signed, Kate Sable, MD  02/12/2022 11:01 AM    [place]

## 2022-02-12 NOTE — Telephone Encounter (Signed)
Patient has been scheduled for 02/16/2022 at 3:40pm for a hospital follow-up.

## 2022-02-16 ENCOUNTER — Ambulatory Visit (INDEPENDENT_AMBULATORY_CARE_PROVIDER_SITE_OTHER): Payer: 59 | Admitting: Family Medicine

## 2022-02-16 ENCOUNTER — Encounter: Payer: Self-pay | Admitting: Family Medicine

## 2022-02-16 VITALS — BP 132/78 | HR 77 | Temp 98.3°F | Ht 62.5 in | Wt 139.8 lb

## 2022-02-16 DIAGNOSIS — E871 Hypo-osmolality and hyponatremia: Secondary | ICD-10-CM | POA: Diagnosis not present

## 2022-02-16 DIAGNOSIS — R002 Palpitations: Secondary | ICD-10-CM

## 2022-02-16 DIAGNOSIS — G894 Chronic pain syndrome: Secondary | ICD-10-CM

## 2022-02-16 NOTE — Progress Notes (Unsigned)
    SUBJECTIVE:   CHIEF COMPLAINT / HPI: follow up hospital discharge  Patient admitted to hospital between 09/12 and 09/13 and treated for hyponatremia. Discharged on 09/13. Second admission for similar acute hyponatremia. Presents today for follow up. Reports improvement in symptoms.  Has endocrinology follow up on 11/06.  Requires repeat Sodium today.  PERTINENT  PMH / PSH:  Hyponatremia Chronic pain  OBJECTIVE:   BP 132/78 (BP Location: Left Arm, Patient Position: Sitting, Cuff Size: Normal)   Pulse 77   Temp 98.3 F (36.8 C) (Oral)   Ht 5' 2.5" (1.588 m)   Wt 139 lb 12.8 oz (63.4 kg)   SpO2 99%   BMI 25.16 kg/m    General: Alert, no acute distress Cardio: Normal S1 and S2, RRR, no r/m/g Pulm: CTAB, normal work of breathing Extremities: No peripheral edema.   ASSESSMENT/PLAN:   Hyponatremia Follow up recent discharge from hospital for acute hyponatremia.  Unknown etiology, both admission suspected hypoosmolar vs SIADH etiology.   -Will refer to Nephrology for further evaluation. -Continue 1.5L fluid restrict.   -Could consider pain management consult for spinal cord stimulation if candidate in hopes to wean Lyrica and Cymbalta taken for neuropathy. -Advise cessation of EtOh -Strict ED precautions provided  Chronic pain syndrome Stable.  Tolerating well.  Suspect medications and pain may be contributing to symptoms of hyponatremia.   -Discussed referral to Pain management for spinal cord stimulator to evaluation -Continue Lyrica 150 mg daily  -Continue Cymbalta 60 mg daily -Continue Methocarbamol 500 mg qid -Will revisit once evaluated by Nephrology for hyponatremia  HCM PAP due.  Patient to schedule appointment.  PDMP Reviewed  Carollee Leitz, MD

## 2022-02-16 NOTE — Patient Instructions (Addendum)
It was a pleasure meeting you today. Thank you for allowing me to take part in your health care.  Our goals for today as we discussed include:  Labs today Follow up with Endocrinology and Neurology as scheduled  Monitor BP at home.  Record reading and bring to next visit.   PAP is due.  Please schedule an appointment at your earliest convenience  Please follow-up with PCP in 4-6 weeks  If you have any questions or concerns, please do not hesitate to call the office at [phone].  I look forward to our next visit and until then take care and stay safe.  Regards,   Carollee Leitz, MD   [place]   Hyponatremia Hyponatremia is when the amount of salt (sodium) in your blood is too low. When salt levels are low, your body cells may take in extra water. This can cause swelling. The swelling often affects the brain. What are the causes? Certain medical problems or conditions. Vomiting a lot. Having watery poop (diarrhea) often. Sweating too much. Taking certain medicines or using illegal drugs. Fluids given through an IV tube. What increases the risk? Having heart, kidney, or liver failure. Having a medical condition that causes you to have watery poop a lot. Doing very hard exercises. Taking medicines that affect the amount of salt that is in your blood. What are the signs or symptoms? Symptoms of this condition include: Headache. Feeling like you may vomit (nausea). Vomiting. Being very tired. Muscle weakness and cramps. Not wanting to eat as much as normal. Feeling weak or dizzy. Very bad symptoms of this condition include: Confusion. Feeling restless. Having a fast heart rate. Fainting. Seizures. Coma. How is this treated? Treatment for this condition depends on the cause. Treatment may include: Getting fluids through an IV tube that is put into one of your veins. Taking medicines to fix the salt levels in your blood. If medicines are causing  the problem, your medicines will need to be changed. Limiting how much water or fluid you take in, in some cases. Monitoring in the hospital to watch your symptoms. Follow these instructions at home:  Take over-the-counter and prescription medicines only as told by your doctor. Many medicines can make this condition worse. Talk with your doctor about any medicines that you are taking. Do not drink alcohol. Keep all follow-up visits. Contact a doctor if: You feel more like you may vomit. You feel more tired. Your headache gets worse. You feel more confused. You feel weaker. Your symptoms go away and then they come back. Get help right away if: You have a seizure. You faint. You keep having watery poop. You keep vomiting. Summary Hyponatremia is when the amount of salt in your blood is too low. When salt levels are low, you can have swelling throughout the body. The swelling mostly affects the brain. Treatment depends on the cause. Treatment may include IV fluids and changing medicines. This information is not intended to replace advice given to you by your health care provider. Make sure you discuss any questions you have with your health care provider. Document Revised: 11/25/2020 Document Reviewed: 11/25/2020 Elsevier Patient Education  [place].

## 2022-02-17 ENCOUNTER — Encounter: Payer: Self-pay | Admitting: Family Medicine

## 2022-02-17 LAB — COMPREHENSIVE METABOLIC PANEL
ALT: 16 U/L (ref 0–35)
AST: 22 U/L (ref 0–37)
Albumin: 4.1 g/dL (ref 3.5–5.2)
Alkaline Phosphatase: 85 U/L (ref 39–117)
BUN: 16 mg/dL (ref 6–23)
CO2: 27 mEq/L (ref 19–32)
Calcium: 9.1 mg/dL (ref 8.4–10.5)
Chloride: 104 mEq/L (ref 96–112)
Creatinine, Ser: 0.77 mg/dL (ref 0.40–1.20)
GFR: 87.04 mL/min (ref >60.00)
Glucose, Bld: 74 mg/dL (ref 70–99)
Potassium: 3.7 mEq/L (ref 3.5–5.1)
Sodium: 140 mEq/L (ref 135–145)
Total Bilirubin: 0.3 mg/dL (ref 0.2–1.2)
Total Protein: 6.6 g/dL (ref 6.0–8.3)

## 2022-02-17 NOTE — Assessment & Plan Note (Addendum)
Follow up recent discharge from hospital for acute hyponatremia.  Unknown etiology, both admission suspected hypoosmolar vs SIADH etiology.   -Will refer to Nephrology for further evaluation. -Continue 1.5L fluid restrict.   -Could consider pain management consult for spinal cord stimulation if candidate in hopes to wean Lyrica and Cymbalta taken for neuropathy. -Advise cessation of EtOh -Strict ED precautions provided

## 2022-02-17 NOTE — Assessment & Plan Note (Addendum)
Stable.  Tolerating well.  Suspect medications and pain may be contributing to symptoms of hyponatremia.   -Discussed referral to Pain management for spinal cord stimulator to evaluation -Continue Lyrica 150 mg daily  -Continue Cymbalta 60 mg daily -Continue Methocarbamol 500 mg qid -Will revisit once evaluated by Nephrology for hyponatremia

## 2022-03-04 ENCOUNTER — Encounter: Payer: Self-pay | Admitting: Oncology

## 2022-03-05 DIAGNOSIS — R002 Palpitations: Secondary | ICD-10-CM | POA: Diagnosis not present

## 2022-03-08 ENCOUNTER — Other Ambulatory Visit: Payer: Self-pay | Admitting: Orthopedic Surgery

## 2022-03-08 DIAGNOSIS — M7918 Myalgia, other site: Secondary | ICD-10-CM

## 2022-03-08 DIAGNOSIS — M4325 Fusion of spine, thoracolumbar region: Secondary | ICD-10-CM

## 2022-03-08 DIAGNOSIS — M546 Pain in thoracic spine: Secondary | ICD-10-CM

## 2022-03-08 MED ORDER — METHOCARBAMOL 500 MG PO TABS: 500.0000 mg | ORAL_TABLET | Freq: Three times a day (TID) | ORAL | 0 refills | Status: DC | PRN

## 2022-03-08 NOTE — Telephone Encounter (Signed)
From: Neva Seat To: Office of Jannett Celestine, [place] Sent: 03/05/2022 3:44 PM EDT Subject: Medication Renewal Request  Refills have been requested for the following medications:   methocarbamol (ROBAXIN) 500 MG tablet [name] M]  Preferred pharmacy: [id number] [place], [address] Delivery method: Arlyss Gandy

## 2022-03-11 ENCOUNTER — Ambulatory Visit: Payer: Medicare HMO | Attending: Cardiology

## 2022-03-11 DIAGNOSIS — Z8679 Personal history of other diseases of the circulatory system: Secondary | ICD-10-CM

## 2022-03-11 DIAGNOSIS — R0600 Dyspnea, unspecified: Secondary | ICD-10-CM | POA: Diagnosis not present

## 2022-03-11 LAB — ECHOCARDIOGRAM COMPLETE
AR max vel: 2.09 cm2
AV Area VTI: 1.96 cm2
AV Area mean vel: 1.99 cm2
AV Mean grad: 4 mmHg
AV Peak grad: 7.1 mmHg
Ao pk vel: 1.33 m/s
Area-P 1/2: 3.24 cm2
S' Lateral: 3 cm
Single Plane A2C EF: 74.7 %

## 2022-03-12 ENCOUNTER — Encounter: Payer: Self-pay | Admitting: Cardiology

## 2022-03-12 ENCOUNTER — Telehealth: Payer: Self-pay

## 2022-03-12 NOTE — Telephone Encounter (Signed)
-----   Message from Kate Sable, MD sent at 03/11/2022  5:22 PM EDT ----- Paroxysmal SVT noted on cardiac monitor, likely reason for palpitations., keep follow-up appointment,.  Okay to start Toprol-XL 25 mg daily if patient is agreeable.

## 2022-03-12 NOTE — Telephone Encounter (Signed)
The patient has been notified of the result via VM per DPR on file.  Encouraged patient to call back with any questions or concerns. Also sent a MyChart message requesting a response if she would like for [place] to send in the prescription for the Toprol-XL.

## 2022-03-14 ENCOUNTER — Other Ambulatory Visit: Payer: Self-pay | Admitting: Internal Medicine

## 2022-03-14 DIAGNOSIS — M545 Low back pain, unspecified: Secondary | ICD-10-CM

## 2022-03-15 ENCOUNTER — Telehealth: Payer: Self-pay | Admitting: Cardiology

## 2022-03-15 MED ORDER — METOPROLOL SUCCINATE ER 25 MG PO TB24: 25.0000 mg | ORAL_TABLET | Freq: Every day | ORAL | 3 refills | Status: DC

## 2022-03-15 NOTE — Telephone Encounter (Signed)
Sent prescription in as requested. Replied to patients MyChart.

## 2022-03-15 NOTE — Telephone Encounter (Signed)
Next Appt With Family Medicine Carollee Leitz, MD) 03/16/2022 at 11:00 AM

## 2022-03-15 NOTE — Telephone Encounter (Signed)
Pt calling in regards to [facility] message from Nurse. Pt states that she is willing to try requested medication and would like for it to be called into below pharmacy. Please advise  [facility] [address] [place] - [place], [place] [employer] AT [address] Phone:  [phone]

## 2022-03-16 ENCOUNTER — Ambulatory Visit: Payer: Medicare HMO | Admitting: Family Medicine

## 2022-03-17 ENCOUNTER — Other Ambulatory Visit: Payer: Self-pay | Admitting: Family Medicine

## 2022-03-17 DIAGNOSIS — Z1231 Encounter for screening mammogram for malignant neoplasm of breast: Secondary | ICD-10-CM

## 2022-03-17 NOTE — Progress Notes (Unsigned)
   Telephone Visit- Progress Note: Referring Physician:  McLean-Scocuzza, Nino Glow, MD No address on file  Primary Physician:  Carollee Leitz, MD  This visit was performed via telephone.  Patient location: home Provider location: office  I spent a total of 15 minutes non-face-to-face activities for this visit on the date of this encounter including review of current clinical condition and response to treatment.   Chief Complaint:  f/u pain in right shoulder blade.   History of Present Illness: Jilliana Burkes is a 56 y.o. female who has a history of HTN, MVP, SAIDH, and absence of right kidney. History of gastric bypass. Was in hospital in July for hyponatremia and again in September.   She has history of ACDF C3-C7 and T10-S1 fusion by Dr. Burman Riis in 2016.   Last seen by me on 02/02/22 for pain in right scapular region that I felt was more myofascial in nature. She was given robaxin.   She has been having episodes of her heart racing- has seen cardiology and this has improved on a beta-blocker. This seems to have improved her right scapular pain as well.   Pain in right shoulder blade is intermittent. She has general stiffness in her back when she gets up in the morning. She has not been back to gym due to her heart issues, but she is getting ready to go back. No radicular pain in arms/legs. No numbness or tingling. Had one episode of pain in left shoulder blade, but this resolved.   She continue son prn robaxin with relief.   Exam: No exam done as this was a telephone encounter.     Imaging: Nothing new to review.    Assessment and Plan: Ms. Didio is a pleasant 56 y.o. female with some improvement in right scapular pain since her last visit. Pain is now intermittent. No radicular pain in her arms/legs.   Previous xrays from July (lumbar and rib) show no thoracic fractures. Instrumented fusion T10-S1 looks good.   Treatment options discussed with patient and following plan made:    - Return to gym on her own.  - Continue on prn robaxin. Reviewed dosing and side effects.  - Care with NSAIDs due to having one kidney and history of gastric bypass.  - If pain gets worse, consider dedicated thoracic xrays and possible referral to PT.  - Follow up in clinic in 6 weeks for recheck.  - Follow up with cardiology, renal, and endocrine as scheduled.   [name] PA-C Neurosurgery

## 2022-03-18 ENCOUNTER — Encounter: Payer: Self-pay | Admitting: Orthopedic Surgery

## 2022-03-18 ENCOUNTER — Ambulatory Visit (INDEPENDENT_AMBULATORY_CARE_PROVIDER_SITE_OTHER): Payer: Medicare HMO | Admitting: Orthopedic Surgery

## 2022-03-18 DIAGNOSIS — M7918 Myalgia, other site: Secondary | ICD-10-CM

## 2022-03-18 DIAGNOSIS — M546 Pain in thoracic spine: Secondary | ICD-10-CM

## 2022-03-18 DIAGNOSIS — M4325 Fusion of spine, thoracolumbar region: Secondary | ICD-10-CM | POA: Diagnosis not present

## 2022-03-19 NOTE — Telephone Encounter (Signed)
Spoke to Patient regarding Lyrica renewal and she said that should not have been sent to [place] that it goes to her Neurologist -Dr. Manuella Ghazi. She asked me to let you know she is sorry for that and will try to get it straightened out. Patient just got a refill a few days ago.

## 2022-03-20 NOTE — Telephone Encounter (Signed)
noted 

## 2022-03-22 ENCOUNTER — Telehealth: Payer: Self-pay

## 2022-03-22 NOTE — Telephone Encounter (Signed)
Tristin called from [facility] regarding patien's prescription for pregabalin (LYRICA) 50 MG capsule.    Tristin states message with the prescription they received today states, "Patient has already picked up prescription".  Tristin states she would like to know if we want her to put this prescription on file or just cancel it altogether.

## 2022-03-22 NOTE — Telephone Encounter (Signed)
Spoke to [place] at [employer] to let her know the Patient said her Neurologist- Dr. Manuella Ghazi writes that prescription and she did not know why it was sent to [place]. Patient also stated she just picked that medication up a few days ago. [facility] pharmacist Helyn App said they will cancel the prescription request that was sent here and let Dr. Manuella Ghazi keep writing that medication. Patient stated Dr. Manuella Ghazi needs to write the Lyrica prescription.

## 2022-03-29 DIAGNOSIS — E871 Hypo-osmolality and hyponatremia: Secondary | ICD-10-CM | POA: Diagnosis not present

## 2022-04-02 ENCOUNTER — Encounter: Payer: Self-pay | Admitting: Cardiology

## 2022-04-02 ENCOUNTER — Ambulatory Visit: Payer: Medicare HMO | Attending: Cardiology | Admitting: Cardiology

## 2022-04-02 VITALS — BP 150/92 | HR 56 | Ht 62.0 in | Wt 144.2 lb

## 2022-04-02 DIAGNOSIS — R030 Elevated blood-pressure reading, without diagnosis of hypertension: Secondary | ICD-10-CM | POA: Diagnosis not present

## 2022-04-02 DIAGNOSIS — I340 Nonrheumatic mitral (valve) insufficiency: Secondary | ICD-10-CM

## 2022-04-02 DIAGNOSIS — R0600 Dyspnea, unspecified: Secondary | ICD-10-CM | POA: Diagnosis not present

## 2022-04-02 DIAGNOSIS — Q231 Congenital insufficiency of aortic valve: Secondary | ICD-10-CM | POA: Diagnosis not present

## 2022-04-02 DIAGNOSIS — R002 Palpitations: Secondary | ICD-10-CM | POA: Diagnosis not present

## 2022-04-02 NOTE — Patient Instructions (Signed)
Medication Instructions:   Your physician recommends that you continue on your current medications as directed. Please refer to the Current Medication list given to you today.   *If you need a refill on your cardiac medications before your next appointment, please call your pharmacy*   Testing/Procedures:  Your physician has requested that you have an echocardiogram in 18 months. Echocardiography is a painless test that uses sound waves to create images of your heart. It provides your doctor with information about the size and shape of your heart and how well your heart's chambers and valves are working. This procedure takes approximately one hour. There are no restrictions for this procedure. Please do NOT wear cologne, perfume, aftershave, or lotions (deodorant is allowed). Please arrive 15 minutes prior to your appointment time.    Follow-Up: At [facility], you and your health needs are our priority.  As part of our continuing mission to provide you with exceptional heart care, we have created designated Provider Care Teams.  These Care Teams include your primary Cardiologist (physician) and Advanced Practice Providers (APPs -  Physician Assistants and Nurse Practitioners) who all work together to provide you with the care you need, when you need it.  We recommend signing up for the patient portal called "MyChart".  Sign up information is provided on this After Visit Summary.  MyChart is used to connect with patients for Virtual Visits (Telemedicine).  Patients are able to view lab/test results, encounter notes, upcoming appointments, etc.  Non-urgent messages can be sent to your provider as well.   To learn more about what you can do with MyChart, go to [email or web address].    Your next appointment:   Follow up in 18 months after your Echocadiogram   The format for your next appointment:   In Person  Provider:   Kate Sable, MD    Other  Instructions   Important Information About Sugar

## 2022-04-02 NOTE — Progress Notes (Signed)
Cardiology Office Note:    Date:  04/02/2022   ID:  Shelby Colon, DOB 12/30/1965, MRN [id number]  PCP:  Carollee Leitz, MD   [place] Prov[facility] Cardiologist:  Kate Sable, MD     Referring MD: Carollee Leitz, MD   Chief Complaint  Patient presents with   Follow-up    Testing Follow up,     History of Present Illness:    Shelby Colon is a 56 y.o. female with a hx of anxiety, chronic back pain who presents for follow-up.  Previously seen due to palpitations and shortness of breath.    Echocardiogram and cardiac monitor was placed to evaluate any significant structural abnormalities and arrhythmias.  Echo did not show normal systolic function, cardiac monitor showed paroxysmal SVTs.  Toprol-XL 25 mg daily was started due to paroxysmal SVTs.  She states symptoms have completely resolved, she feels much better.  Prior notes Echo reviewed by myself 02/2022 EF 60 to 65%, bicuspid aortic valve, mild MR, Cardiac monitor 02/2022 paroxysmal SVTs, PACs.  Past Medical History:  Diagnosis Date   Abnormal MRI, cervical spine 10/17/2019   FINDINGS: Alignment: Reversal of the normal cervical lordosis with apex at C3-4. Trace retrolisthesis of C4 on C5, C5 on C6, and C6 on C7, with anterolisthesis of C7 on T1. Findings chronic and facet mediated.   Vertebrae: Vertebral body height maintained without evidence for acute or chronic fracture. Bone marrow signal intensity within normal limits. No discrete or worrisome osseous lesions. Pro   Abnormal Pap smear of cervix    h/o LSIL pap with HPV +; 01/2019 pap negative negative HPV   Altered mental status 12/08/2021   Annual physical exam 06/12/2020   Anxiety    Anxiety 06/12/2020   Atrial fibrillation ([place])    B12 deficiency    Cardiac murmur 02/14/2018   Chronic knee pain (Bilateral) 09/07/2018   09/21/2018 left total knee ortho [place] [facility]    Chronic pain syndrome    Colon polyps    Congenital absence  of right kidney    Degeneration of lumbar intervertebral disc    Depression    Early menopause    Elevated alkaline phosphatase level 02/23/2019   Gallstones    Gastric bypass status for obesity    2013, weight 258lb   Headache    Heart murmur    MVP   History of chicken pox    HPV in female 11/30/2017   Hypercholesterolemia    Hypertension    Iron deficiency    Iron deficiency anemia 03/02/2019   Neuropathy    Rheumatoid arthritis ([place])    as child    Scoliosis of lumbar spine    Solitary kidney, congenital    pt thinks has left kidney only no right kidney   Thyroid disease    in the past    UTI (urinary tract infection)    Vitamin D deficiency     Past Surgical History:  Procedure Laterality Date   ANTERIOR CERVICAL DECOMPRESSION/DISCECTOMY FUSION 4 LEVELS N/A 11/28/2019   Procedure: ANTERIOR CERVICAL DECOMPRESSION/DISCECTOMY FUSION 4 LEVELS C3-7;  Surgeon: Meade Maw, MD;  Location: [facility] ORS;  Service: Neurosurgery;  Laterality: N/A;   AUGMENTATION MAMMAPLASTY Bilateral 2009   BACK SURGERY  11/04/2014   fusion of spine of lumbosacral region 2 rods and 16 screws    BREAST ENHANCEMENT SURGERY     COLONOSCOPY WITH PROPOFOL N/A 01/06/2018   Procedure: COLONOSCOPY WITH PROPOFOL;  Surgeon: Virgel Manifold,  MD;  Location: [facility] ENDOSCOPY;  Service: Endoscopy;  Laterality: N/A;   COLONOSCOPY WITH PROPOFOL N/A 03/19/2019   Procedure: COLONOSCOPY WITH PROPOFOL;  Surgeon: Virgel Manifold, MD;  Location: [facility] ENDOSCOPY;  Service: Endoscopy;  Laterality: N/A;   excess skin removal     KNEE ARTHROSCOPY Bilateral    x 7 knees b/l (2 surgeries on right knee) total 1 bone graft and 5 arthroscopy total knee    ROUX-EN-Y PROCEDURE     TOTAL KNEE ARTHROPLASTY     left 09/21/18 [name] [name]   TOTAL KNEE ARTHROPLASTY     right knee Dr. Lavonia Drafts     Current Medications: Current Meds  Medication Sig   DULoxetine (CYMBALTA) 60 MG capsule Take 60  mg by mouth daily.   methocarbamol (ROBAXIN) 500 MG tablet Take 1 tablet (500 mg total) by mouth every 8 (eight) hours as needed for muscle spasms.   metoprolol succinate (TOPROL XL) 25 MG 24 hr tablet Take 1 tablet (25 mg total) by mouth daily.   pregabalin (LYRICA) 100 MG capsule Take 1 capsule (100 mg total) by mouth at bedtime. 50 mg bid, 150 mg qhs   pregabalin (LYRICA) 50 MG capsule TAKE ONE CAPSULE BY MOUTH TWICE A DAY AND TAKE ONE CAPSULE BY MOUTH AT BEDTIME   VITAMIN D-VITAMIN K PO Take by mouth.     Allergies:   Morphine   Social History   Socioeconomic History   Marital status: Married    Spouse name: Not on file   Number of children: Not on file   Years of education: Not on file   Highest education level: Not on file  Occupational History   Not on file  Tobacco Use   Smoking status: Never   Smokeless tobacco: Never  Vaping Use   Vaping Use: Never used  Substance and Sexual Activity   Alcohol use: Yes    Comment: socially   Drug use: No   Sexual activity: Yes  Other Topics Concern   Not on file  Social History Narrative   Married since 2012    No kids    adopted   From [facility] moved [facility] and now lives here    Currently unemployed used to be [place] unemployed since 04/2018    BA degree    No guns, wears seat belt, safe in relationship    Social Determinants of Health   Financial Resource Strain: Not on file  Food Insecurity: No Food Insecurity (02/10/2022)   Hunger Vital Sign    Worried About Running Out of Food in the Last Year: Never true    [place] in the Last Year: Never true  Transportation Needs: No Transportation Needs (02/10/2022)   PRAPARE - Hydrologist (Medical): No    Lack of Transportation (Non-Medical): No  Physical Activity: Not on file  Stress: Not on file  Social Connections: Not on file     Family History: The patient's family history  includes Aneurysm in her maternal aunt; Arthritis in her father and mother; Breast cancer in her maternal aunt; Cancer in her maternal aunt; Depression in her father; Diabetes in her brother, father, and sister; Drug abuse in her father and mother; Early death in her mother; Fibromyalgia in her maternal aunt and sister; Hypertension in her father; Osteoarthritis in her maternal grandmother; Rashes / Skin problems in her sister; Thyroid disease in her maternal aunt.  She was adopted.  ROS:   Please see the history of present illness.     All other systems reviewed and are negative.  EKGs/Labs/Other Studies Reviewed:    The following studies were reviewed today:   EKG:  EKG not ordered today.    Recent Labs: 02/09/2022: TSH 3.135 02/10/2022: Hemoglobin 11.7; Magnesium 2.4; Platelets 403 02/16/2022: ALT 16; BUN 16; Creatinine, Ser 0.77; Potassium 3.7; Sodium 140  Recent Lipid Panel    Component Value Date/Time   CHOL 184 02/15/2019 1004   TRIG 55 02/15/2019 1004   HDL 117 02/15/2019 1004   CHOLHDL 1.6 02/15/2019 1004   CHOLHDL 1.8 12/05/2017 1044   LDLCALC 56 02/15/2019 1004   LDLCALC 74 12/05/2017 1044     Risk Assessment/Calculations:        Physical Exam:    VS:  BP (!) 150/92 (BP Location: Left Arm, Patient Position: Sitting, Cuff Size: Normal)   Pulse (!) 56   Ht '5\' 2"'$  (1.575 m)   Wt 144 lb 3.2 oz (65.4 kg)   SpO2 100%   BMI 26.37 kg/m     Wt Readings from Last 3 Encounters:  04/02/22 144 lb 3.2 oz (65.4 kg)  02/16/22 139 lb 12.8 oz (63.4 kg)  02/12/22 136 lb (61.7 kg)     GEN:  Well nourished, well developed in no acute distress HEENT: Normal NECK: No JVD; No carotid bruits CARDIAC: RRR, no murmurs, rubs, gallops RESPIRATORY:  Clear to auscultation without rales, wheezing or rhonchi  ABDOMEN: Soft, non-tender, non-distended MUSCULOSKELETAL:  No edema; No deformity  SKIN: Warm and dry NEUROLOGIC:  Alert and oriented x 3 PSYCHIATRIC:  Normal affect    ASSESSMENT:    1. Palpitations   2. Dyspnea, unspecified type   3. Mitral valve insufficiency, unspecified etiology   4. Bicuspid aortic valve   5. Elevated BP without diagnosis of hypertension    PLAN:    In order of problems listed above:  Paroxysmal SVT on cardiac monitor, palpitations.  Improved with Toprol-XL.  Continue Toprol-XL 25 mg daily. Dyspnea, not consistent with angina.  Feels better.  EF 60 to 65%,no structural findings to suggest etiology of shortness of breath. Mild MR, no evidence for mitral valve prolapse. Aortic valve appears bicuspid.  No stenosis, no regurgitation noted.  Repeat echo in 12 to 18 months. BP elevated, usually controlled.  Likely whitecoat hypertension.  Continue to monitor.  Follow-up in about 12-18 months after repeat echo.      Medication Adjustments/Labs and Tests Ordered: Current medicines are reviewed at length with the patient today.  Concerns regarding medicines are outlined above.  Orders Placed This Encounter  Procedures   ECHOCARDIOGRAM COMPLETE   No orders of the defined types were placed in this encounter.   Patient Instructions  Medication Instructions:   Your physician recommends that you continue on your current medications as directed. Please refer to the Current Medication list given to you today.   *If you need a refill on your cardiac medications before your next appointment, please call your pharmacy*   Testing/Procedures:  Your physician has requested that you have an echocardiogram in 18 months. Echocardiography is a painless test that uses sound waves to create images of your heart. It provides your doctor with information about the size and shape of your heart and how well your heart's chambers and valves are working. This procedure takes approximately one hour. There are no restrictions for this procedure. Please do NOT wear cologne, perfume, aftershave, or  lotions (deodorant is allowed). Please arrive 15  minutes prior to your appointment time.    Follow-Up: At [facility], you and your health needs are our priority.  As part of our continuing mission to provide you with exceptional heart care, we have created designated Provider Care Teams.  These Care Teams include your primary Cardiologist (physician) and Advanced Practice Providers (APPs -  Physician Assistants and Nurse Practitioners) who all work together to provide you with the care you need, when you need it.  We recommend signing up for the patient portal called "MyChart".  Sign up information is provided on this After Visit Summary.  MyChart is used to connect with patients for Virtual Visits (Telemedicine).  Patients are able to view lab/test results, encounter notes, upcoming appointments, etc.  Non-urgent messages can be sent to your provider as well.   To learn more about what you can do with MyChart, go to [email or web address].    Your next appointment:   Follow up in 18 months after your Echocadiogram   The format for your next appointment:   In Person  Provider:   Kate Sable, MD    Other Instructions   Important Information About Sugar         Signed, Kate Sable, MD  04/02/2022 11:34 AM    [place]

## 2022-04-05 ENCOUNTER — Ambulatory Visit: Payer: Medicare HMO | Admitting: "Endocrinology

## 2022-04-06 DIAGNOSIS — M898X1 Other specified disorders of bone, shoulder: Secondary | ICD-10-CM | POA: Diagnosis not present

## 2022-04-06 DIAGNOSIS — E559 Vitamin D deficiency, unspecified: Secondary | ICD-10-CM | POA: Diagnosis not present

## 2022-04-06 DIAGNOSIS — M7918 Myalgia, other site: Secondary | ICD-10-CM | POA: Diagnosis not present

## 2022-04-06 DIAGNOSIS — R5382 Chronic fatigue, unspecified: Secondary | ICD-10-CM | POA: Diagnosis not present

## 2022-04-06 DIAGNOSIS — M546 Pain in thoracic spine: Secondary | ICD-10-CM | POA: Diagnosis not present

## 2022-04-06 DIAGNOSIS — E871 Hypo-osmolality and hyponatremia: Secondary | ICD-10-CM | POA: Diagnosis not present

## 2022-04-06 DIAGNOSIS — G5793 Unspecified mononeuropathy of bilateral lower limbs: Secondary | ICD-10-CM | POA: Diagnosis not present

## 2022-04-07 ENCOUNTER — Other Ambulatory Visit: Payer: Self-pay | Admitting: Orthopedic Surgery

## 2022-04-07 DIAGNOSIS — M4325 Fusion of spine, thoracolumbar region: Secondary | ICD-10-CM

## 2022-04-07 DIAGNOSIS — M546 Pain in thoracic spine: Secondary | ICD-10-CM

## 2022-04-07 DIAGNOSIS — M7918 Myalgia, other site: Secondary | ICD-10-CM

## 2022-04-07 MED ORDER — METHOCARBAMOL 500 MG PO TABS: 500.0000 mg | ORAL_TABLET | Freq: Three times a day (TID) | ORAL | 0 refills | Status: DC | PRN

## 2022-04-07 NOTE — Telephone Encounter (Signed)
Refill for robaxin done. She has f/u with me on 05/04/22.

## 2022-04-07 NOTE — Telephone Encounter (Signed)
From: Neva Seat To: Office of Jannett Celestine, [place] Sent: 04/07/2022 8:25 AM EST Subject: Medication Renewal Request  Refills have been requested for the following medications:   methocarbamol (ROBAXIN) 500 MG tablet [name] M]  Preferred pharmacy: [id number] [place], [address] Delivery method: Arlyss Gandy

## 2022-04-20 ENCOUNTER — Ambulatory Visit
Admission: RE | Admit: 2022-04-20 | Discharge: 2022-04-20 | Disposition: A | Discharge status: Home / Self Care | Payer: Medicare HMO | Source: Ambulatory Visit | Attending: Family Medicine | Admitting: Family Medicine

## 2022-04-20 DIAGNOSIS — Z1231 Encounter for screening mammogram for malignant neoplasm of breast: Secondary | ICD-10-CM | POA: Diagnosis not present

## 2022-05-03 ENCOUNTER — Other Ambulatory Visit: Payer: Self-pay | Admitting: Orthopedic Surgery

## 2022-05-03 DIAGNOSIS — M546 Pain in thoracic spine: Secondary | ICD-10-CM

## 2022-05-03 DIAGNOSIS — M7918 Myalgia, other site: Secondary | ICD-10-CM

## 2022-05-03 DIAGNOSIS — M4325 Fusion of spine, thoracolumbar region: Secondary | ICD-10-CM

## 2022-05-03 MED ORDER — METHOCARBAMOL 500 MG PO TABS: 500.0000 mg | ORAL_TABLET | Freq: Three times a day (TID) | ORAL | 0 refills | Status: DC | PRN

## 2022-05-03 NOTE — Progress Notes (Deleted)
Referring Physician:  Carollee Leitz, MD [place],  [place] [id number]  Primary Physician:  Carollee Leitz, MD  History of Present Illness: History of HTN, MVP, SAIDH, and absence of right kidney. History of gastric bypass. Was in hospital in July for hyponatremia.   Last seen by me on 03/18/22 and she was having improvement in right scapular pain since her last visit. Pain was intermittent. No radicular pain in her arms/legs.    Previous xrays from July (lumbar and rib) show no thoracic fractures. Instrumented fusion T10-S1 looks good.   She was to return to gym on her own and continue with prn robaxin. Care with NSAIDs as she has only one kidney and has history of gastric bypass. She is here for follow up.       Last seen by Dr. Izora Ribas on 09/25/20 for right shoulder and trapezius pain. She has history of ACDF C3-C7 and T10-S1 fusion by Dr. Burman Riis in 2016.   Here today with complaints of more constant pain in right shoulder blade x 6 months. Pain is worse with prolonged standing or sitting. No neck or lower back pain. No radiating arm or leg pain. She has numbness/tingling in right > left hand that is worse at night. No known alleviating factors. No weakness in arms or legs.   Conservative measures:  Physical therapy: No recent PT  Multimodal medical therapy including regular antiinflammatories: cymbalta, lyrica, tylenol, and aleve.  Injections: No recent epidural steroid injections  Past Surgery: see above for cervical and TL fusion.    Review of Systems:  A 10 point review of systems is negative, except for the pertinent positives and negatives detailed in the HPI.  Past Medical History: Past Medical History:  Diagnosis Date   Abnormal MRI, cervical spine 10/17/2019   FINDINGS: Alignment: Reversal of the normal cervical lordosis with apex at C3-4. Trace retrolisthesis of C4 on C5, C5 on C6, and C6 on C7, with anterolisthesis of C7 on T1. Findings chronic and facet  mediated.   Vertebrae: Vertebral body height maintained without evidence for acute or chronic fracture. Bone marrow signal intensity within normal limits. No discrete or worrisome osseous lesions. Pro   Abnormal Pap smear of cervix    h/o LSIL pap with HPV +; 01/2019 pap negative negative HPV   Altered mental status 12/08/2021   Annual physical exam 06/12/2020   Anxiety    Anxiety 06/12/2020   Atrial fibrillation ([place])    B12 deficiency    Cardiac murmur 02/14/2018   Chronic knee pain (Bilateral) 09/07/2018   09/21/2018 left total knee ortho [place] [facility]    Chronic pain syndrome    Colon polyps    Congenital absence of right kidney    Degeneration of lumbar intervertebral disc    Depression    Early menopause    Elevated alkaline phosphatase level 02/23/2019   Gallstones    Gastric bypass status for obesity    2013, weight 258lb   Headache    Heart murmur    MVP   History of chicken pox    HPV in female 11/30/2017   Hypercholesterolemia    Hypertension    Iron deficiency    Iron deficiency anemia 03/02/2019   Neuropathy    Rheumatoid arthritis ([place])    as child    Scoliosis of lumbar spine    Solitary kidney, congenital    pt thinks has left kidney only no right kidney   Thyroid  disease    in the past    UTI (urinary tract infection)    Vitamin D deficiency     Past Surgical History: Past Surgical History:  Procedure Laterality Date   ANTERIOR CERVICAL DECOMPRESSION/DISCECTOMY FUSION 4 LEVELS N/A 11/28/2019   Procedure: ANTERIOR CERVICAL DECOMPRESSION/DISCECTOMY FUSION 4 LEVELS C3-7;  Surgeon: Meade Maw, MD;  Location: [facility] ORS;  Service: Neurosurgery;  Laterality: N/A;   AUGMENTATION MAMMAPLASTY Bilateral 2009   BACK SURGERY  11/04/2014   fusion of spine of lumbosacral region 2 rods and 16 screws    BREAST ENHANCEMENT SURGERY     COLONOSCOPY WITH PROPOFOL N/A 01/06/2018   Procedure: COLONOSCOPY WITH PROPOFOL;  Surgeon: Virgel Manifold, MD;  Location: [facility] ENDOSCOPY;  Service: Endoscopy;  Laterality: N/A;   COLONOSCOPY WITH PROPOFOL N/A 03/19/2019   Procedure: COLONOSCOPY WITH PROPOFOL;  Surgeon: Virgel Manifold, MD;  Location: [facility] ENDOSCOPY;  Service: Endoscopy;  Laterality: N/A;   excess skin removal     KNEE ARTHROSCOPY Bilateral    x 7 knees b/l (2 surgeries on right knee) total 1 bone graft and 5 arthroscopy total knee    ROUX-EN-Y PROCEDURE     TOTAL KNEE ARTHROPLASTY     left 09/21/18 [name] [name]   TOTAL KNEE ARTHROPLASTY     right knee Dr. Lavonia Drafts     Allergies: Allergies as of 05/04/2022 - Review Complete 04/02/2022  Allergen Reaction Noted   Morphine Nausea And Vomiting 05/14/2014    Medications: Outpatient Encounter Medications as of 05/04/2022  Medication Sig   Blood Pressure KIT 1 each by Does not apply route in the morning and at bedtime.   DULoxetine (CYMBALTA) 60 MG capsule Take 60 mg by mouth daily.   methocarbamol (ROBAXIN) 500 MG tablet Take 1 tablet (500 mg total) by mouth every 8 (eight) hours as needed for muscle spasms.   metoprolol succinate (TOPROL XL) 25 MG 24 hr tablet Take 1 tablet (25 mg total) by mouth daily.   pregabalin (LYRICA) 100 MG capsule Take 1 capsule (100 mg total) by mouth at bedtime. 50 mg bid, 150 mg qhs   pregabalin (LYRICA) 50 MG capsule TAKE ONE CAPSULE BY MOUTH TWICE A DAY AND TAKE ONE CAPSULE BY MOUTH AT BEDTIME   VITAMIN D-VITAMIN K PO Take by mouth.   No facility-administered encounter medications on file as of 05/04/2022.    Social History: Social History   Tobacco Use   Smoking status: Never   Smokeless tobacco: Never  Vaping Use   Vaping Use: Never used  Substance Use Topics   Alcohol use: Yes    Comment: socially   Drug use: No    Family Medical History: Family History  Adopted: Yes  Problem Relation Age of Onset   Arthritis Mother    Drug abuse Mother        heriod OD   Early death Mother    Diabetes  Father    Hypertension Father    Depression Father    Arthritis Father    Drug abuse Father    Diabetes Sister    Fibromyalgia Sister    Rashes / Skin problems Sister        PSORIASIS   Diabetes Brother    Osteoarthritis Maternal Grandmother    Aneurysm Maternal Aunt    Thyroid disease Maternal Aunt    Fibromyalgia Maternal Aunt    Breast cancer Maternal Aunt    Cancer Maternal Aunt  breast cancer     Physical Examination: There were no vitals filed for this visit.    Awake, alert, oriented to person, place, and time.  Speech is clear and fluent. Fund of knowledge is appropriate.   Cranial Nerves: Pupils equal round and reactive to light.  Facial tone is symmetric.    ROM of spine:  Limited ROM of thoracolumbar spine with no pain.   Well healed cervical and thoracolumbar incisions.   No abnormal lesions on exposed skin.   Strength: Side Biceps Triceps Deltoid Interossei Grip Wrist Ext. Wrist Flex.  R _0 L _1 Side Iliopsoas Quads Hamstring PF DF EHL  R _2 L _3 Reflexes are 2+ and symmetric at the biceps, triceps, brachioradialis, patella and achilles.   Hoffman's is positive on right, negative on left.   Bilateral upper and lower extremity sensation is intact to light touch. Some subjective numbness left lateral calf.   She has mild tenderness right scapular region and into trapezial region as well on right.   No evidence of dysmetria noted.  She has slight limp to her gait.   Medical Decision Making  Imaging: No updated imaging. ***  I have personally reviewed the images and agree with the above interpretation.  Assessment and Plan: Ms. Pierron is a pleasant 56 y.o. female with 6 month history of more constant pain in right shoulder blade that is worse with prolonged standing or sitting. No neck or lower back pain. No radiating arm or leg pain.   History of ACDF C3-C7 and T10-S1 fusion by Dr. Burman Riis in 2016.    Xrays look good and pain appears more myofasical in nature with tenderness over right scapular region.   Above treatment options discussed with patient and following plan made:   - New prescription for robaxin. Reviewed dosing and side effects. Take with food.  - Care with NSAIDs due to having one kidney and history of gastric bypass.  - Okay to restart going to gym. Start slow and stop if she has increased pain.  - Discussed referral to PT. Will hold off for now, but can revisit if no improvement.  - Follow up for phone visit in 6 weeks.   I spent a total of 30 minutes in face-to-face and non-face-to-face activities related to this patient's care today.  Thank you for involving me in the care of this patient.   [name] PA-C Dept. of Neurosurgery

## 2022-05-03 NOTE — Telephone Encounter (Signed)
From: Neva Seat To: Office of Jannett Celestine, [place] Sent: 05/03/2022 12:49 PM EST Subject: Medication Renewal Request  Refills have been requested for the following medications:   methocarbamol (ROBAXIN) 500 MG tablet Shelby Colon M]  Preferred pharmacy: [id number] [place], [address] Delivery method: Arlyss Gandy

## 2022-05-03 NOTE — Telephone Encounter (Signed)
Robaxin last filled 04/07/22. Would be out in next few days. Refill sent to pharmacy. Has f/u with me tomorrow.

## 2022-05-04 ENCOUNTER — Ambulatory Visit: Payer: Medicare HMO | Admitting: Orthopedic Surgery

## 2022-06-01 ENCOUNTER — Ambulatory Visit: Payer: Medicare HMO | Admitting: Orthopedic Surgery

## 2022-06-09 DIAGNOSIS — M4325 Fusion of spine, thoracolumbar region: Secondary | ICD-10-CM

## 2022-06-09 DIAGNOSIS — M546 Pain in thoracic spine: Secondary | ICD-10-CM

## 2022-06-09 DIAGNOSIS — M7918 Myalgia, other site: Secondary | ICD-10-CM

## 2022-06-23 ENCOUNTER — Encounter: Payer: Self-pay | Admitting: Family Medicine

## 2022-06-23 ENCOUNTER — Ambulatory Visit (INDEPENDENT_AMBULATORY_CARE_PROVIDER_SITE_OTHER): Payer: Medicare HMO | Admitting: Family Medicine

## 2022-06-23 VITALS — BP 150/90 | HR 73 | Temp 98.6°F | Ht 62.0 in | Wt 123.4 lb

## 2022-06-23 DIAGNOSIS — F39 Unspecified mood [affective] disorder: Secondary | ICD-10-CM

## 2022-06-23 DIAGNOSIS — E222 Syndrome of inappropriate secretion of antidiuretic hormone: Secondary | ICD-10-CM

## 2022-06-23 DIAGNOSIS — R053 Chronic cough: Secondary | ICD-10-CM | POA: Diagnosis not present

## 2022-06-23 DIAGNOSIS — I10 Essential (primary) hypertension: Secondary | ICD-10-CM | POA: Diagnosis not present

## 2022-06-23 MED ORDER — ZYRTEC ALLERGY 10 MG PO CAPS: 10.0000 mg | ORAL_CAPSULE | Freq: Every day | ORAL | 0 refills | Status: DC

## 2022-06-23 MED ORDER — FLUTICASONE PROPIONATE 50 MCG/ACT NA SUSP: 2.0000 | Freq: Every day | NASAL | 6 refills | Status: DC

## 2022-06-23 NOTE — Patient Instructions (Addendum)
It was a pleasure meeting you today. Thank you for allowing me to take part in your health care.  Our goals for today as we discussed include:  Schedule appointment in nurse clinic for 1 week to check blood pressure  If still high will start blood pressure medication  If you have any questions or concerns, please do not hesitate to call the office at [phone].  I look forward to our next visit and until then take care and stay safe.  Regards,   Carollee Leitz, MD   [facility]

## 2022-06-23 NOTE — Progress Notes (Signed)
SUBJECTIVE:   Chief Complaint  Patient presents with   Acute Visit    Cough Feels like throat is clogged On & off since Christmas   HPI Patient presents to clinic with multiple concerns.  Discussed with patient we could only deal with a couple of her concerns today given limited time.  Would like sodium to be checked History of low sodium.  Reports feels like she is getting forgetful again.  Not sleeping well.  Having brain fog.  Feels like similar symptoms she had previously when sodium is low.  Reports has not had any alcohol intake or CBD Gummies.  Would like psychiatry referral Patient reports that she does not feel like her old self.  She wants to discuss this with psychiatrist and may be start some medications.  She is currently on Lyrica and duloxetine for pain management.  She reports she had all the surgeries in the past and thought she would be able to resume her normal activity.  She reports that since she had the surgery she feels like she is not herself.  Denies any SI/HI.  Cough Has had a cough since Christmas.  Cough worse at night.  Feels like increased mucus in her throat.  Denies any fevers, chest pain, shortness of breath, recent sick contacts.  Previously took nasal sprays but has not been using this.  Does not think could be related to allergies.    PERTINENT PMH / PSH: Anxiety/depression Chronic pain syndrome Gastric bypass Early menopause   OBJECTIVE:  BP (!) 150/90   Pulse 73   Temp 98.6 F (37 C)   Ht '5\' 2"'$  (1.575 m)   Wt 123 lb 6.4 oz (56 kg)   SpO2 98%   BMI 22.57 kg/m    Physical Exam Vitals reviewed.  Constitutional:      General: She is not in acute distress.    Appearance: Normal appearance. She is normal weight. She is not ill-appearing or toxic-appearing.  HENT:     Head: Normocephalic.     Right Ear: Tympanic membrane, ear canal and external ear normal.     Left Ear: Tympanic membrane, ear canal and external ear normal.     Nose:  Nose normal. No congestion or rhinorrhea.     Mouth/Throat:     Mouth: Mucous membranes are moist.  Eyes:     Conjunctiva/sclera: Conjunctivae normal.  Cardiovascular:     Rate and Rhythm: Normal rate and regular rhythm.     Heart sounds: Normal heart sounds.  Pulmonary:     Effort: Pulmonary effort is normal.     Breath sounds: Normal breath sounds. No wheezing or rhonchi.  Abdominal:     General: Abdomen is flat. Bowel sounds are normal. There is no distension.     Palpations: Abdomen is soft.  Musculoskeletal:        General: Normal range of motion.     Cervical back: Normal range of motion.  Skin:    General: Skin is warm.  Neurological:     Mental Status: She is alert and oriented to person, place, and time. Mental status is at baseline.  Psychiatric:        Mood and Affect: Mood normal.        Behavior: Behavior normal.        Thought Content: Thought content normal.        Judgment: Judgment normal.     ASSESSMENT/PLAN:  Primary hypertension Assessment & Plan: Chronic.  Elevated today.  Repeat check remains elevated.  Currently on metoprolol 25 mg daily. Asymptomatic.  Recently been taking decongestant for persistent cough Check kidney functions today Monitor blood pressure at home, goal less than 130/80 Follow-up in 1 week nurse clinic for repeat blood pressure check If remains elevated plan to add losartan 25 mg daily     Orders: -     Comprehensive metabolic panel  SIADH (syndrome of inappropriate ADH production) ([place]) Assessment & Plan: Chronic.  Stable.  No mental confusion today however with recent symptoms of feeling forgetful will check sodium levels today. Previously seen by endocrinology 01/01/22 and recommended to follow-up 11/23.  Had been evaluated by nephrology 04/26/2022 and recommended to follow-up 11/23.  Could not find any documentation of scheduled follow-up appointment in epic or Care Everywhere. Recommend patient follow-up with both  specialties as previously suggested.   Orders: -     Comprehensive metabolic panel  Mood disorder [facility]) Assessment & Plan: Chronic.  Patient with history of anxiety/depression/PTSD.  Is requesting referral to psychiatry  Referral sent to psychiatry.  Orders: -     Ambulatory referral to Psychiatry  Chronic cough Assessment & Plan: Chronic.  Stable.  In no acute distress.  Suspect postnasal drip given cough worse at night with increased phlegm in throat. Will try Flonase nasal spray Zyrtec 10 mg daily Nasal saline as needed Can use humidifier Follow-up if no improvement in symptoms.  Orders: -     Fluticasone Propionate; Place 2 sprays into both nostrils daily.  Dispense: 16 g; Refill: 6 -     ZyrTEC Allergy; Take 1 capsule (10 mg total) by mouth daily.  Dispense: 30 capsule; Refill: 0  HCM Colonoscopy up-to-date.  Recommended every 5 years for history of polyps.  Due 2025 Mammogram due 2025 Declined flu vaccine Pap due.  Most recent cytology NILM, HPV negative.  History of abnormal Pap, positive HPV Recommend shingles vaccine   PDMP reviewed  Return in about 1 week (around 06/30/2022) for RN clinic, BP check.  Carollee Leitz, MD

## 2022-06-26 ENCOUNTER — Encounter: Payer: Self-pay | Admitting: Family Medicine

## 2022-06-26 DIAGNOSIS — R059 Cough, unspecified: Secondary | ICD-10-CM | POA: Insufficient documentation

## 2022-06-26 DIAGNOSIS — F39 Unspecified mood [affective] disorder: Secondary | ICD-10-CM | POA: Insufficient documentation

## 2022-06-26 NOTE — Assessment & Plan Note (Signed)
Chronic.  Patient with history of anxiety/depression/PTSD.  Is requesting referral to psychiatry  Referral sent to psychiatry.

## 2022-06-26 NOTE — Assessment & Plan Note (Signed)
Chronic.  Stable.  In no acute distress.  Suspect postnasal drip given cough worse at night with increased phlegm in throat. Will try Flonase nasal spray Zyrtec 10 mg daily Nasal saline as needed Can use humidifier Follow-up if no improvement in symptoms.

## 2022-06-26 NOTE — Assessment & Plan Note (Signed)
Chronic.  Elevated today.  Repeat check remains elevated.  Currently on metoprolol 25 mg daily. Asymptomatic.  Recently been taking decongestant for persistent cough Check kidney functions today Monitor blood pressure at home, goal less than 130/80 Follow-up in 1 week nurse clinic for repeat blood pressure check If remains elevated plan to add losartan 25 mg daily

## 2022-06-26 NOTE — Assessment & Plan Note (Signed)
Chronic.  Stable.  No mental confusion today however with recent symptoms of feeling forgetful will check sodium levels today. Previously seen by endocrinology 01/01/22 and recommended to follow-up 11/23.  Had been evaluated by nephrology 04/26/2022 and recommended to follow-up 11/23.  Could not find any documentation of scheduled follow-up appointment in epic or Care Everywhere. Recommend patient follow-up with both specialties as previously suggested.

## 2022-06-29 ENCOUNTER — Ambulatory Visit (INDEPENDENT_AMBULATORY_CARE_PROVIDER_SITE_OTHER): Payer: Medicare HMO

## 2022-06-29 ENCOUNTER — Other Ambulatory Visit: Payer: Self-pay | Admitting: Orthopedic Surgery

## 2022-06-29 ENCOUNTER — Other Ambulatory Visit: Payer: Medicare HMO

## 2022-06-29 DIAGNOSIS — M546 Pain in thoracic spine: Secondary | ICD-10-CM

## 2022-06-29 DIAGNOSIS — E222 Syndrome of inappropriate secretion of antidiuretic hormone: Secondary | ICD-10-CM

## 2022-06-29 DIAGNOSIS — M4325 Fusion of spine, thoracolumbar region: Secondary | ICD-10-CM

## 2022-06-29 DIAGNOSIS — M7918 Myalgia, other site: Secondary | ICD-10-CM

## 2022-06-29 DIAGNOSIS — I10 Essential (primary) hypertension: Secondary | ICD-10-CM

## 2022-06-29 MED ORDER — METHOCARBAMOL 500 MG PO TABS: 500.0000 mg | ORAL_TABLET | Freq: Three times a day (TID) | ORAL | 0 refills | Status: DC | PRN

## 2022-06-29 NOTE — Progress Notes (Signed)
Patient here for nurse visit BP check per order from Dr. Volanda Napoleon.   Patient reports compliance with prescribed BP medications: yes  Last dose of BP medication:   This morning she only take a Beta Blocker  BP Readings from Last 3 Encounters:  06/23/22 (!) 150/90  04/02/22 (!) 150/92  02/16/22 132/78   Pulse Readings from Last 3 Encounters:  06/23/22 73  04/02/22 (!) 56  02/16/22 77    Per Dr. Dr. Volanda Napoleon patient needs to check BP at home and continue her Beta Blocker as per Cardiologist.    Patient verbalized understanding of instructions.   Gordy Councilman, CMA

## 2022-06-29 NOTE — Addendum Note (Signed)
Addended by: Fulton Mole D on: 06/29/2022 03:43 PM   Modules accepted: Orders

## 2022-06-29 NOTE — Telephone Encounter (Signed)
Refill of robaxin done under My Chart encounter.

## 2022-06-29 NOTE — Telephone Encounter (Signed)
From: Neva Seat To: Office of Jannett Celestine, [place] Sent: 06/29/2022 2:11 PM EST Subject: Medication Renewal Request  Refills have been requested for the following medications:   methocarbamol (ROBAXIN) 500 MG tablet [name] M]  Preferred pharmacy: [id number] [place], [address] Delivery method: Arlyss Gandy

## 2022-06-30 ENCOUNTER — Encounter: Payer: Self-pay | Admitting: Family Medicine

## 2022-06-30 ENCOUNTER — Other Ambulatory Visit: Payer: Self-pay | Admitting: Family Medicine

## 2022-06-30 LAB — COMPREHENSIVE METABOLIC PANEL
ALT: 36 U/L — ABNORMAL HIGH (ref 0–35)
AST: 38 U/L — ABNORMAL HIGH (ref 0–37)
Albumin: 4.1 g/dL (ref 3.5–5.2)
Alkaline Phosphatase: 100 U/L (ref 39–117)
BUN: 19 mg/dL (ref 6–23)
CO2: 33 mEq/L — ABNORMAL HIGH (ref 19–32)
Calcium: 8.9 mg/dL (ref 8.4–10.5)
Chloride: 99 mEq/L (ref 96–112)
Creatinine, Ser: 0.89 mg/dL (ref 0.40–1.20)
GFR: 72.97 mL/min (ref >60.00)
Glucose, Bld: 69 mg/dL — ABNORMAL LOW (ref 70–99)
Potassium: 4.2 mEq/L (ref 3.5–5.1)
Sodium: 141 mEq/L (ref 135–145)
Total Bilirubin: 0.3 mg/dL (ref 0.2–1.2)
Total Protein: 6.5 g/dL (ref 6.0–8.3)

## 2022-07-01 ENCOUNTER — Telehealth: Payer: Self-pay

## 2022-07-01 ENCOUNTER — Other Ambulatory Visit: Payer: Self-pay

## 2022-07-01 MED ORDER — AMLODIPINE BESYLATE 5 MG PO TABS: 5.0000 mg | ORAL_TABLET | Freq: Every day | ORAL | 3 refills | Status: DC

## 2022-07-01 NOTE — Telephone Encounter (Signed)
error 

## 2022-07-08 ENCOUNTER — Other Ambulatory Visit: Payer: Self-pay | Admitting: *Deleted

## 2022-07-08 MED ORDER — METOPROLOL SUCCINATE ER 25 MG PO TB24: 25.0000 mg | ORAL_TABLET | Freq: Every day | ORAL | 3 refills | Status: DC

## 2022-07-11 NOTE — Progress Notes (Signed)
   SUBJECTIVE:  No chief complaint on file.  HPI ***  PERTINENT PMH / PSH: ***  OBJECTIVE:  There were no vitals taken for this visit.   Physical Exam  ASSESSMENT/PLAN:  There are no diagnoses linked to this encounter. PDMP reviewed***  No follow-ups on file.  Carollee Leitz, MD

## 2022-07-12 ENCOUNTER — Encounter: Payer: Self-pay | Admitting: Family Medicine

## 2022-07-12 ENCOUNTER — Ambulatory Visit (INDEPENDENT_AMBULATORY_CARE_PROVIDER_SITE_OTHER): Payer: Medicare HMO | Admitting: Family Medicine

## 2022-07-12 VITALS — BP 132/84 | HR 74 | Temp 98.2°F | Ht 62.0 in | Wt 140.0 lb

## 2022-07-12 DIAGNOSIS — I10 Essential (primary) hypertension: Secondary | ICD-10-CM

## 2022-07-12 NOTE — Assessment & Plan Note (Signed)
Chronic.  Improved with amlodipine. Continue metoprolol XL 25 mg daily Continue amlodipine 5 mg daily Monitor blood pressure at home.  Goal less than 130/80.

## 2022-07-20 ENCOUNTER — Encounter: Payer: Self-pay | Admitting: *Deleted

## 2022-07-20 ENCOUNTER — Ambulatory Visit: Payer: Medicare HMO | Admitting: *Deleted

## 2022-07-20 ENCOUNTER — Telehealth: Payer: Self-pay | Admitting: *Deleted

## 2022-07-20 VITALS — BP 132/84 | Ht 62.0 in | Wt 140.0 lb

## 2022-07-26 ENCOUNTER — Telehealth (HOSPITAL_COMMUNITY): Payer: Self-pay | Admitting: Licensed Clinical Social Worker

## 2022-07-26 NOTE — Progress Notes (Signed)
Pt did not have AWV with me due to being within her first 12 months of [employer]. Pt has been rescheduled for a [employer] visit with PCP.  Unable to close chart. Must be closed by provider. No charge for this visit.
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
## 2022-07-30 ENCOUNTER — Other Ambulatory Visit: Payer: Self-pay | Admitting: Orthopedic Surgery

## 2022-07-30 DIAGNOSIS — M546 Pain in thoracic spine: Secondary | ICD-10-CM

## 2022-07-30 DIAGNOSIS — M7918 Myalgia, other site: Secondary | ICD-10-CM

## 2022-07-30 DIAGNOSIS — M4325 Fusion of spine, thoracolumbar region: Secondary | ICD-10-CM

## 2022-07-30 MED ORDER — METHOCARBAMOL 500 MG PO TABS: 500.0000 mg | ORAL_TABLET | Freq: Three times a day (TID) | ORAL | 0 refills | Status: DC | PRN

## 2022-07-30 NOTE — Telephone Encounter (Signed)
From: Neva Seat To: Office of Jannett Celestine, [place] Sent: 07/30/2022 8:47 AM EST Subject: Medication Renewal Request  Refills have been requested for the following medications:   methocarbamol (ROBAXIN) 500 MG tablet [name] M]  Preferred pharmacy: [facility] [id number] - [name], [place] Delivery method: [employer]

## 2022-07-30 NOTE — Progress Notes (Unsigned)
Referring Physician:  No referring provider defined for this encounter.  Primary Physician:  Shelby Leitz, MD  History of Present Illness: 08/03/2022  History of HTN, MVP, SAIDH, and absence of right kidney. History of gastric bypass.   She has history of ACDF C3-C7 and T10-S1 fusion by Dr. Burman Riis in 2016.    Last did a phone visit with me on 03/18/22 and she had some improvement in right scapular pain. She continues on prn robaxin.   She is also following with neurology- was sent to PT in November for scapular trigger points.   She is still having intermittent scapular pain that can be on right or left side. She feels like is "hunched forward" when she walks. She also notes intermittent popping in her neck- no increased pain. She has some intermittent neck pain. No arm pain. She notes pain in her left CMC joint. She has some pain in right hand as well. She notes intermittent numbness/tingling in her hands.   She did not go to PT in the fall. She continues going to the gym on her own. Does not feel like the robaxin is helping.   Conservative measures:  Physical therapy: No recent PT  Multimodal medical therapy including regular antiinflammatories: cymbalta, lyrica, tylenol, and aleve, robaxin  Injections: No recent epidural steroid injections  Past Surgery: see above for cervical and TL fusion.   Shelby Colon has no symptoms of cervical myelopathy.  The symptoms are causing a significant impact on the patient's life.   Review of Systems:  A 10 point review of systems is negative, except for the pertinent positives and negatives detailed in the HPI.  Past Medical History: Past Medical History:  Diagnosis Date   Abnormal MRI, cervical spine 10/17/2019   FINDINGS: Alignment: Reversal of the normal cervical lordosis with apex at C3-4. Trace retrolisthesis of C4 on C5, C5 on C6, and C6 on C7, with anterolisthesis of C7 on T1. Findings chronic and facet mediated.   Vertebrae:  Vertebral body height maintained without evidence for acute or chronic fracture. Bone marrow signal intensity within normal limits. No discrete or worrisome osseous lesions. Pro   Abnormal Pap smear of cervix    h/o LSIL pap with HPV +; 01/2019 pap negative negative HPV   Alcohol use 02/09/2022   Altered mental status 12/08/2021   Altered mental status 12/08/2021   Annual physical exam 06/12/2020   Anxiety    Anxiety 06/12/2020   Anxiety and depression 11/30/2017   Atrial fibrillation ([place])    Atrophy of right kidney 03/05/2019   B12 deficiency    Benign neoplasm of ascending colon    Brain cyst 12/10/2021   Cardiac murmur 02/14/2018   Chronic knee pain (Bilateral) 09/07/2018   09/21/2018 left total knee ortho [place] Dr[facility]    Chronic pain syndrome    Colon polyps    Confusion 12/10/2021   Congenital absence of right kidney    Degeneration of lumbar intervertebral disc    Depression    Early menopause    Elevated alkaline phosphatase level 02/23/2019   Gallstones    Gastric bypass status for obesity    2013, weight 258lb   Headache    Heart murmur    MVP   History of chicken pox    HPV in female 11/30/2017   Hypercholesterolemia    Hypertension    Iron deficiency    Iron deficiency anemia 03/02/2019   Neuropathy    Personal history  of colonic polyps    Pharmacologic therapy 10/17/2019   Polyp of sigmoid colon    Problems influencing health status 10/17/2019   Rheumatoid arthritis ([place])    as child    Scoliosis of lumbar spine    Solitary kidney, congenital    pt thinks has left kidney only no right kidney   Thrombocytosis 02/23/2019   Thyroid disease    in the past    UTI (urinary tract infection)    Vitamin D deficiency     Past Surgical History: Past Surgical History:  Procedure Laterality Date   ANTERIOR CERVICAL DECOMPRESSION/DISCECTOMY FUSION 4 LEVELS N/A 11/28/2019   Procedure: ANTERIOR CERVICAL DECOMPRESSION/DISCECTOMY  FUSION 4 LEVELS C3-7;  Surgeon: Meade Maw, MD;  Location: [facility] ORS;  Service: Neurosurgery;  Laterality: N/A;   AUGMENTATION MAMMAPLASTY Bilateral 2009   BACK SURGERY  11/04/2014   fusion of spine of lumbosacral region 2 rods and 16 screws    BREAST ENHANCEMENT SURGERY     COLONOSCOPY WITH PROPOFOL N/A 01/06/2018   Procedure: COLONOSCOPY WITH PROPOFOL;  Surgeon: Virgel Manifold, MD;  Location: [facility] ENDOSCOPY;  Service: Endoscopy;  Laterality: N/A;   COLONOSCOPY WITH PROPOFOL N/A 03/19/2019   Procedure: COLONOSCOPY WITH PROPOFOL;  Surgeon: Virgel Manifold, MD;  Location: [facility] ENDOSCOPY;  Service: Endoscopy;  Laterality: N/A;   excess skin removal     KNEE ARTHROSCOPY Bilateral    x 7 knees b/l (2 surgeries on right knee) total 1 bone graft and 5 arthroscopy total knee    ROUX-EN-Y PROCEDURE     TOTAL KNEE ARTHROPLASTY     left 09/21/18 [name] [name]   TOTAL KNEE ARTHROPLASTY     right knee Dr. Lavonia Drafts     Allergies: Allergies as of 08/03/2022 - Review Complete 07/20/2022  Allergen Reaction Noted   Morphine Nausea And Vomiting 05/14/2014    Medications: Outpatient Encounter Medications as of 08/03/2022  Medication Sig   amLODipine (NORVASC) 5 MG tablet Take 1 tablet (5 mg total) by mouth daily.   Cetirizine HCl (ZYRTEC ALLERGY) 10 MG CAPS Take 1 capsule (10 mg total) by mouth daily.   DULoxetine (CYMBALTA) 60 MG capsule Take 60 mg by mouth daily.   fluticasone (FLONASE) 50 MCG/ACT nasal spray Place 2 sprays into both nostrils daily.   methocarbamol (ROBAXIN) 500 MG tablet Take 1 tablet (500 mg total) by mouth every 8 (eight) hours as needed for muscle spasms.   metoprolol succinate (TOPROL XL) 25 MG 24 hr tablet Take 1 tablet (25 mg total) by mouth daily.   pregabalin (LYRICA) 50 MG capsule TAKE ONE CAPSULE BY MOUTH TWICE A DAY AND TAKE ONE CAPSULE BY MOUTH AT BEDTIME   VITAMIN D-VITAMIN K PO Take by mouth.   No facility-administered  encounter medications on file as of 08/03/2022.    Social History: Social History   Tobacco Use   Smoking status: Never   Smokeless tobacco: Never  Vaping Use   Vaping Use: Never used  Substance Use Topics   Alcohol use: Yes    Comment: socially   Drug use: No    Family Medical History: Family History  Adopted: Yes  Problem Relation Age of Onset   Arthritis Mother    Drug abuse Mother        heriod OD   Early death Mother    Diabetes Father    Hypertension Father    Depression Father    Arthritis Father    Drug abuse Father  Diabetes Sister    Fibromyalgia Sister    Rashes / Skin problems Sister        PSORIASIS   Diabetes Brother    Osteoarthritis Maternal Grandmother    Aneurysm Maternal Aunt    Thyroid disease Maternal Aunt    Fibromyalgia Maternal Aunt    Breast cancer Maternal Aunt    Cancer Maternal Aunt        breast cancer     Physical Examination: There were no vitals filed for this visit.    Awake, alert, oriented to person, place, and time.  Speech is clear and fluent. Fund of knowledge is appropriate.   Cranial Nerves: Pupils equal round and reactive to light.  Facial tone is symmetric.    ROM of spine:  Limited ROM of thoracolumbar spine with no pain.   Well healed cervical and thoracolumbar incisions.  She has mild tenderness bilateral medial border of scapula. She has mild bilateral trapezial tenderness as well.   No abnormal lesions on exposed skin.   Strength: Side Biceps Triceps Deltoid Interossei Grip Wrist Ext. Wrist Flex.  R '5 5 5 5 5 5 5  '$ L '5 5 5 5 5 5 5   '$ Side Iliopsoas Quads Hamstring PF DF EHL  R '5 5 5 5 5 5  '$ L '5 5 5 5 5 5   '$ Reflexes are 2+ and symmetric at the biceps, triceps, brachioradialis, patella and achilles.   Hoffman's is negative.   Bilateral upper and lower extremity sensation is intact to light touch. Some subjective numbness left leg from knee down.   She has tenderness over [facility] joint on left.   Gait is  normal.   Medical Decision Making  Imaging: none  Assessment and Plan: Shelby Colon is a pleasant 57 y.o. female with intermittent scapular pain that can be on right or left side. She feels like is "hunched forward" when she walks. She also notes intermittent popping in her neck- no increased pain. She has some intermittent neck pain. No arm pain.   History of ACDF C3-C7 and T10-S1 fusion by Dr. Burman Riis in 2016.   Previous TL xrays looked good and pain continues to appear more myofasical in nature with tenderness medial border of both scapula.   Above treatment options discussed with patient and following plan made:   - Full length scoliosis xrays ordered. Will look at overall alignment. Will add cervical xrays as well. - New prescription for flexeril. Reviewed dosing and side effects. She knows this can make her sleepy. Stop the robaxin.  - Care with NSAIDs due to having one kidney and history of gastric bypass.  - Okay to continue going to gym for now.  - Referral to ortho for bilateral hand pain, likely with CMC arthritis.  - Will message her back once I have xray results and regroup. May need to revisit formal PT.   I spent a total of 20 minutes in face-to-face and non-face-to-face activities related to this patient's care today.  [name] PA-C Dept. of Neurosurgery

## 2022-08-03 ENCOUNTER — Ambulatory Visit: Payer: Medicare HMO | Admitting: Orthopedic Surgery

## 2022-08-03 ENCOUNTER — Encounter: Payer: Self-pay | Admitting: Orthopedic Surgery

## 2022-08-03 VITALS — BP 130/82 | Ht 62.0 in | Wt 140.6 lb

## 2022-08-03 DIAGNOSIS — M7918 Myalgia, other site: Secondary | ICD-10-CM

## 2022-08-03 DIAGNOSIS — M79642 Pain in left hand: Secondary | ICD-10-CM

## 2022-08-03 DIAGNOSIS — M79641 Pain in right hand: Secondary | ICD-10-CM | POA: Diagnosis not present

## 2022-08-03 DIAGNOSIS — M4325 Fusion of spine, thoracolumbar region: Secondary | ICD-10-CM | POA: Diagnosis not present

## 2022-08-03 DIAGNOSIS — Z981 Arthrodesis status: Secondary | ICD-10-CM

## 2022-08-03 MED ORDER — CYCLOBENZAPRINE HCL 10 MG PO TABS: 10.0000 mg | ORAL_TABLET | Freq: Three times a day (TID) | ORAL | 0 refills | Status: DC | PRN

## 2022-08-03 NOTE — Progress Notes (Deleted)
The patient is here for the Welcome to  Medicare  preventive visit     has a past medical history of Abnormal MRI, cervical spine (10/17/2019), Abnormal Pap smear of cervix, Alcohol use (02/09/2022), Altered mental status (12/08/2021), Altered mental status (12/08/2021), Annual physical exam (06/12/2020), Anxiety, Anxiety (06/12/2020), Anxiety and depression (11/30/2017), Atrial fibrillation ([place]), Atrophy of right kidney (03/05/2019), B12 deficiency, Benign neoplasm of ascending colon, Brain cyst (12/10/2021), Cardiac murmur (02/14/2018), Chronic knee pain (Bilateral) (09/07/2018), Chronic pain syndrome, Colon polyps, Confusion (12/10/2021), Congenital absence of right kidney, Degeneration of lumbar intervertebral disc, Depression, Early menopause, Elevated alkaline phosphatase level (02/23/2019), Gallstones, Gastric bypass status for obesity, Headache, Heart murmur, History of chicken pox, 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    reports that she has never smoked. She has never used smokeless tobacco. She reports current alcohol use. She reports that she does not use drugs.   The roster of all physicians providing medical care to patient : Patient Care Team: Carollee Leitz, MD as PCP - General (Family Medicine) Kate Sable, MD as PCP - Cardiology (Cardiology)  Activities of daily living:  The patient is 100% independent in all ADLs: dressing, toileting, feeding as well as independent mobility Fall risk was assessed by direct patient evaluation of patient's balance, gait and ability  to risk from a chair and from a kneeling position. Home safety : The patient has smoke detectors in the home. They wear seatbelts.  There are no firearms at home. There is no violence in the home.  Patient has seen their eye doctor in the last year.   Visual acuity was assessed today  and was 20/20 with correction lenses. Patient denies hearing difficulty with regard to whispered voices and some television programs and has  deferred audiologic testing in the last year.   There is no risks for hepatitis, STDs or HIV. There is no   history of blood transfusion. They have no travel history to infectious disease endemic areas of the world.  The patient has seen their dentist in the last six month.  They do not  have excessive sun exposure. Discussed the need for sun protection: hats, long sleeves and use of sunscreen if there is significant sun exposure.   Diet: the importance of a healthy diet is discussed. They do have a healthy diet.  The benefits of regular aerobic exercise were discussed. Patient walks 4 times per week ,  a minimum of 20 minutes.   Depression screen:      07/12/2022   11:06 AM 06/23/2022   10:07 AM 02/16/2022    4:35 PM 02/16/2022    3:29 PM 02/03/2022   11:15 AM  Depression screen PHQ 2/9  Decreased Interest '2 3 2 1 1  '$ Down, Depressed, Hopeless '1 3 1 2 1  '$ PHQ - 2 Score '3 6 3 3 2  '$ Altered sleeping '1 3 3    '$ Tired, decreased energy '1 2 2    '$ Change in appetite '3 3 2    '$ Feeling bad or failure about yourself  '1 3 3    '$ Trouble concentrating 0 0 0    Moving slowly or fidgety/restless 0 0 0    Suicidal thoughts 0 0 0  PHQ-9 Score '9 17 13    '$ Difficult doing work/chores Very difficult Very difficult Somewhat difficult         Cognitive assessment: the patient manages all their financial and personal affairs and is actively engaged. They could relate day,date,year and events; recalled 2/3 objects at 3 minutes; performed clock-face test normally.  The following portions of  the patient's history were reviewed and updated as appropriate: allergies, current medications, past family history, past medical history,  past surgical history, past social history  and problem list.  During the course of the visit the patient was educated and counseled about appropriate screening and preventive services including : fall prevention , diabetes screening, nutrition counseling, colorectal cancer screening, and recommended immunizations   Immunization History  Administered Date(s) Administered   Influenza,inj,Quad PF,6+ Mos 06/10/2020   Influenza-Unspecified 04/14/2019   PFIZER(Purple Top)SARS-COV-2 Vaccination 12/22/2019, 01/12/2020   Td 02/01/2017   Tdap 02/01/2017  HMLISTPATIENTFRIENDLY@ Health Maintenance Due  Topic Date Due   Medicare Annual Wellness (AWV)  Never done   Zoster Vaccines- Shingrix (1 of 2) Never done   PAP SMEAR-Modifier  02/11/2022    Last skin cancer screening :      Vital Signs: There were no vitals taken for this visit.   Exam: General appearance: alert, cooperative and appears stated age Head: Normocephalic, without obvious abnormality, atraumatic Eyes: conjunctivae/corneas clear. PERRL, EOM's intact. Fundi benign. Ears: normal TM's and external ear canals both ears Nose: Nares normal. Septum midline. Mucosa normal. No drainage or sinus tenderness. Throat: lips, mucosa, and tongue normal; teeth and gums normal Neck: no adenopathy, no carotid bruit, no JVD, supple, symmetrical, trachea midline and thyroid not enlarged, symmetric, no tenderness/mass/nodules Lungs: clear to auscultation bilaterally Breasts: normal appearance, no masses or tenderness Heart: regular rate and rhythm, S1, S2 normal, no murmur, click, rub or gallop Abdomen: soft, non-tender; bowel sounds normal; no masses,  no organomegaly Extremities: extremities normal, atraumatic, no cyanosis or edema Pulses: 2+ and symmetric Skin: Skin color, texture, turgor normal. No  rashes or lesions Neurologic: Alert and oriented X 3, normal strength and tone. Normal symmetric reflexes. Normal coordination and gait.     End of Life Discussion and Planning   During the course of the visit , End of Life objectives were discussed at length.  Patient does not have a living will in place or a healthcare power of attorney.  Patient  was given printed information about advance directives and encouraged to return after discussing with their family.  Review of Opioid Prescriptions    Patient does not have a current opioid prescription*** Patient's risk factors for opioid use disorder was reviewed and includes *** Treatment of pain using non-opioid alternatives was reviewed  and encouraged   Patient risk for potential substance abuse was assessed and addressed with counselling.

## 2022-08-03 NOTE — Patient Instructions (Signed)
It was so nice to see you today. Thank you so much for coming in.    I ordered xrays of your entire spine (scoliosis xrays). You can go [facility] radiology to get these. You do not need any appointment. I will message you with results.   I also sent a prescription for flexeril to help with muscle spasms. Use only as needed and be careful, this can make you sleepy. Stop the methocarbamol.   I want you to see ortho at the [facility] for your hands. They should call you to schedule an appointment or you can call them at [phone].  Please do not hesitate to call if you have any questions or concerns. You can also message me in [place].   [name] PA-C [phone]

## 2022-08-04 ENCOUNTER — Ambulatory Visit: Payer: Medicare HMO | Admitting: Family Medicine

## 2022-08-04 NOTE — Addendum Note (Signed)
Addended byGeronimo Boot on: 08/04/2022 08:07 AM   Modules accepted: Orders

## 2022-08-05 ENCOUNTER — Ambulatory Visit
Admission: RE | Admit: 2022-08-05 | Discharge: 2022-08-05 | Disposition: A | Discharge status: Home / Self Care | Payer: Medicare HMO | Source: Ambulatory Visit | Attending: Orthopedic Surgery | Admitting: Orthopedic Surgery

## 2022-08-05 ENCOUNTER — Ambulatory Visit
Admission: RE | Admit: 2022-08-05 | Discharge: 2022-08-05 | Disposition: A | Discharge status: Home / Self Care | Payer: Medicare HMO | Attending: Orthopedic Surgery | Admitting: Orthopedic Surgery

## 2022-08-05 DIAGNOSIS — M7918 Myalgia, other site: Secondary | ICD-10-CM | POA: Insufficient documentation

## 2022-08-05 DIAGNOSIS — Z981 Arthrodesis status: Secondary | ICD-10-CM | POA: Diagnosis not present

## 2022-08-05 DIAGNOSIS — M4325 Fusion of spine, thoracolumbar region: Secondary | ICD-10-CM | POA: Diagnosis not present

## 2022-08-05 DIAGNOSIS — M4322 Fusion of spine, cervical region: Secondary | ICD-10-CM | POA: Diagnosis not present

## 2022-08-05 DIAGNOSIS — M47814 Spondylosis without myelopathy or radiculopathy, thoracic region: Secondary | ICD-10-CM | POA: Diagnosis not present

## 2022-08-05 DIAGNOSIS — M4312 Spondylolisthesis, cervical region: Secondary | ICD-10-CM | POA: Diagnosis not present

## 2022-08-10 ENCOUNTER — Encounter: Payer: Self-pay | Admitting: Oncology

## 2022-08-10 ENCOUNTER — Ambulatory Visit (INDEPENDENT_AMBULATORY_CARE_PROVIDER_SITE_OTHER): Payer: Medicare HMO | Admitting: Licensed Clinical Social Worker

## 2022-08-10 ENCOUNTER — Encounter: Payer: Self-pay | Admitting: Orthopedic Surgery

## 2022-08-10 DIAGNOSIS — Z981 Arthrodesis status: Secondary | ICD-10-CM

## 2022-08-10 DIAGNOSIS — M7918 Myalgia, other site: Secondary | ICD-10-CM

## 2022-08-10 DIAGNOSIS — F4310 Post-traumatic stress disorder, unspecified: Secondary | ICD-10-CM

## 2022-08-10 DIAGNOSIS — M4325 Fusion of spine, thoracolumbar region: Secondary | ICD-10-CM

## 2022-08-10 NOTE — Progress Notes (Signed)
Cervical xrays dated 08/05/22:  FINDINGS: C3-C7 ACDF. Unremarkable hardware. Adjacent segment facet osteoarthritis at C7-T1 with chronic anterolisthesis measuring 4 mm. No evidence of fracture or bone lesion.   IMPRESSION: 1. Unremarkable C3-C7 ACDF. 2. Advanced C7-T1 facet osteoarthritis with 4 mm of anterolisthesis.     Electronically Signed   By: Jorje Guild M.D.   On: 08/05/2022 21:07  Scoliosis xrays dated 08/05/22:  FINDINGS: T10-S1 posterior fusion with underlying mild dextrocurvature. No evidence of hardware fracture or displacement. No evidence of underlying bone lesion or osseous fracture. Generalized midthoracic disc space narrowing. Unremarkable curvature of the unfused spine.   IMPRESSION: 1. Uncomplicated appearance of T10-S1 fusion hardware. 2. Midthoracic disc degeneration.     Electronically Signed   By: Jorje Guild M.D.   On: 08/05/2022 21:06   I have personally reviewed the images and agree with the above interpretation.  Above imaging also reviewed with Dr. Izora Ribas.   Slip at C7-T1 is stable.   If she feels she is hunched forward and her posture has changed, she might benefit from scoliosis eval with one of the [facility] surgeons (Dr. Thornton Park). Can also consider revisiting PT.   Patient sent message to see how she wants to proceed.
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
## 2022-08-18 ENCOUNTER — Encounter: Payer: Self-pay | Admitting: Family Medicine

## 2022-08-18 ENCOUNTER — Ambulatory Visit (INDEPENDENT_AMBULATORY_CARE_PROVIDER_SITE_OTHER): Payer: Medicare HMO | Admitting: Family Medicine

## 2022-08-18 VITALS — BP 118/80 | Temp 98.2°F | Ht 62.0 in | Wt 147.8 lb

## 2022-08-18 DIAGNOSIS — Z0000 Encounter for general adult medical examination without abnormal findings: Secondary | ICD-10-CM

## 2022-08-18 DIAGNOSIS — Z23 Encounter for immunization: Secondary | ICD-10-CM

## 2022-08-18 NOTE — Patient Instructions (Addendum)
Follow up in 6 months  You received the Hepatitis B vaccine today. Schedule RN appointment in 1 month for second dose  Recommend Shingles vaccine.  This is a 2 dose series and can be given at your local pharmacy.  Please talk to your pharmacist about this.    Pap due. Please schedule with OBGYN or PCP at your earliest convenience  Please review Advanced Directive packet and complete with Notary.  Once completed please bring to clinic to add to chart.  Bone Density Test A bone density test uses a type of X-ray to measure the amount of calcium and other minerals in a person's bones. It can measure bone density in the hip and the spine. The test is similar to having a regular X-ray. This test may also be called: Bone densitometry. Bone mineral density test. Dual-energy X-ray absorptiometry (DEXA). You may have this test to: Diagnose a condition that causes weak or thin bones (osteoporosis). Screen you for osteoporosis. Predict your risk for a broken bone (fracture). Determine how well your osteoporosis treatment is working. Tell a health care provider about: Any allergies you have. All medicines you are taking, including vitamins, herbs, eye drops, creams, and over-the-counter medicines. Any problems you or family members have had with anesthetic medicines. Any blood disorders you have. Any surgeries you have had. Any medical conditions you have. Whether you are pregnant or may be pregnant. Any medical tests you have had within the past 14 days that used contrast material. What are the risks? Generally, this is a safe test. However, it does expose you to a small amount of radiation, which can slightly increase your cancer risk. What happens before the test? Do not take any calcium supplements within the 24 hours before your test. You will need to remove all metal jewelry, eyeglasses, removable dental appliances, and any other metal objects on your body. What happens during the  test?  You will lie down on an exam table. There will be an X-ray generator below you and an imaging device above you. Other devices, such as boxes or braces, may be used to position your body properly for the scan. The machine will slowly scan your body. You will need to keep very still while the machine does the scan. The images will show up on a screen in the room. Images will be examined by a specialist after your test is finished. The procedure may vary among health care providers and hospitals. What can I expect after the test? It is up to you to get the results of your test. Ask your health care provider, or the department that is doing the test, when your results will be ready. Summary A bone density test is an imaging test that uses a type of X-ray to measure the amount of calcium and other minerals in your bones. The test may be used to diagnose or screen you for a condition that causes weak or thin bones (osteoporosis), predict your risk for a broken bone (fracture), or determine how well your osteoporosis treatment is working. Do not take any calcium supplements within 24 hours before your test. Ask your health care provider, or the department that is doing the test, when your results will be ready. This information is not intended to replace advice given to you by your health care provider. Make sure you discuss any questions you have with your health care provider. Document Revised: 01/28/2021 Document Reviewed: 11/01/2019 Elsevier Patient Education  [place] and  Cholesterol Restricted Eating Plan Eating a diet that limits fat and cholesterol may help lower your risk for heart disease and other conditions. Your body needs fat and cholesterol for basic functions, but eating too much of these things can be harmful to your health. Your health care provider may order lab tests to check your blood fat (lipid) and cholesterol levels. This helps your health care provider  understand your risk for certain conditions and whether you need to make diet changes. Work with your health care provider or dietitian to make an eating plan that is right for you. Your plan includes: Limit your fat intake to ______% or less of your total calories a day. This is ______g of fat per day. Limit your saturated fat intake to ______% or less of your total calories a day. This is ______g of saturated fat per day. Limit the amount of cholesterol in your diet to less than _________mg a day. Eat ___________ g of fiber a day. What are tips for following this plan? General guidelines If you are overweight, work with your health care provider to lose weight safely. Losing just 5-10% of your body weight can improve your overall health and help prevent diseases such as diabetes and heart disease. Avoid: Foods with added sugar. Fried foods. Foods that contain partially hydrogenated oils, including stick margarine, some tub margarines, cookies, crackers, and other baked goods. If you drink alcohol: Limit how much you have to: 0-1 drink a day for women who are not pregnant. 0-2 drinks a day for men. Know how much alcohol is in a drink. In the [place] one drink equals one 12 oz bottle of beer (355 mL), one 5 oz glass of wine (148 mL), or one 1 oz glass of hard liquor (44 mL). Reading food labels Check food labels for: Trans fats or partially hydrogenated oils. Avoid foods that contain these. High amounts of saturated fat. Choose foods that are low in saturated fat (less than 2 g). The amount of cholesterol in each serving. The amount of fiber in each serving. Choose foods with healthy fats, such as: Monounsaturated and polyunsaturated fats. These include olive and canola oil, flaxseeds, walnuts, almonds, and seeds. Omega-3 fats. These are found in foods such as salmon, mackerel, sardines, tuna, flaxseed oil, and ground flaxseeds. Choose grain products that have whole grains. Look for the  word "whole" as the first word in the ingredient list. Cooking Cook foods using methods other than frying. Baking, boiling, grilling, and broiling are some healthy options. Eat more home-cooked food and less restaurant, buffet, and fast food. Avoid cooking using saturated fats. Animal sources of saturated fats include meats, butter, and cream. Plant sources of saturated fats include palm oil, palm kernel oil, and coconut oil. Meal planning  At meals, imagine dividing your plate into fourths: Fill one-half of your plate with vegetables, green salads, and fruit. Fill one-fourth of your plate with whole grains. Fill one-fourth of your plate with lean protein foods. Eat fish that is high in omega-3 fats at least two times a week. Eat more foods that contain fiber, such as whole grains, beans, apples, pears, berries, broccoli, carrots, peas, and barley. These foods help promote healthy cholesterol levels in the blood. What foods should I eat? Fruits All fresh, canned (in natural juice), or frozen fruits. Vegetables Fresh or frozen vegetables (raw, steamed, roasted, or grilled). Green salads. Grains Whole grains, such as whole wheat or whole grain breads, crackers, cereals, and pasta. Unsweetened oatmeal, bulgur, barley,  quinoa, or brown rice. Corn or whole wheat flour tortillas. Meats and other proteins Ground beef (85% or leaner), grass-fed beef, or beef trimmed of fat. Skinless chicken or [place]. Ground chicken or [place]. Pork trimmed of fat. All fish and seafood. Egg whites. Dried beans, peas, or lentils. Unsalted nuts or seeds. Unsalted canned beans. Natural nut butters without added sugar and oil. Dairy Low-fat or nonfat dairy products, such as skim or 1% milk, 2% or reduced-fat cheeses, low-fat and fat-free ricotta or cottage cheese, or plain low-fat and nonfat yogurt. Fats and oils Tub margarine without trans fats. Light or reduced-fat mayonnaise and salad dressings. Avocado. Olive,  canola, sesame, or safflower oils. The items listed above may not be a complete list of foods and beverages you can eat. Contact a dietitian for more information. What foods should I avoid? Fruits Canned fruit in heavy syrup. Fruit in cream or butter sauce. Fried fruit. Vegetables Vegetables cooked in cheese, cream, or butter sauce. Fried vegetables. Grains White bread. White pasta. White rice. Cornbread. Bagels, pastries, and croissants. Crackers and snack foods that contain trans fat and hydrogenated oils. Meats and other proteins Fatty cuts of meat. Ribs, chicken wings, bacon, sausage, bologna, salami, chitterlings, fatback, hot dogs, bratwurst, and packaged lunch meats. Liver and organ meats. Whole eggs and egg yolks. Chicken and [place] with skin. Fried meat. Dairy Whole or 2% milk, cream, half-and-half, and cream cheese. Whole milk cheeses. Whole-fat or sweetened yogurt. Full-fat cheeses. Nondairy creamers and whipped toppings. Processed cheese, cheese spreads, and cheese curds. Fats and oils Butter, stick margarine, lard, shortening, ghee, or bacon fat. Coconut, palm kernel, and palm oils. Beverages Alcohol. Sugar-sweetened drinks such as sodas, lemonade, and fruit drinks. Sweets and desserts Corn syrup, sugars, honey, and molasses. Candy. Jam and jelly. Syrup. Sweetened cereals. Cookies, pies, cakes, donuts, muffins, and ice cream. The items listed above may not be a complete list of foods and beverages you should avoid. Contact a dietitian for more information. Summary Your body needs fat and cholesterol for basic functions. However, eating too much of these things can be harmful to your health. Work with your health care provider and dietitian to follow a diet that limits fat and cholesterol. Doing this may help lower your risk for heart disease and other conditions. Choose healthy fats, such as monounsaturated and polyunsaturated fats, and foods high in omega-3 fatty acids. Eat  fiber-rich foods, such as whole grains, beans, peas, fruits, and vegetables. Limit or avoid alcohol, fried foods, and foods high in saturated fats, partially hydrogenated oils, and sugar. This information is not intended to replace advice given to you by your health care provider. Make sure you discuss any questions you have with your health care provider. Document Revised: 09/26/2020 Document Reviewed: 09/26/2020 Elsevier Patient Education  [place] Prevention in the Home, Adult Falls can cause injuries and can happen to people of all ages. There are many things you can do to make your home safer and to help prevent falls. What actions can I take to prevent falls? General information Use good lighting in all rooms. Make sure to: Replace any light bulbs that burn out. Turn on the lights in dark areas and use night-lights. Keep items that you use often in easy-to-reach places. Lower the shelves around your home if needed. Move furniture so that there are clear paths around it. Do not use throw rugs or other things on the floor that can make you trip. If any of your floors  are uneven, fix them. Add color or contrast paint or tape to clearly mark and help you see: Grab bars or handrails. First and last steps of staircases. Where the edge of each step is. If you use a ladder or stepladder: Make sure that it is fully opened. Do not climb a closed ladder. Make sure the sides of the ladder are locked in place. Have someone hold the ladder while you use it. Know where your pets are as you move through your home. What can I do in the bathroom?     Keep the floor dry. Clean up any water on the floor right away. Remove soap buildup in the bathtub or shower. Buildup makes bathtubs and showers slippery. Use non-skid mats or decals on the floor of the bathtub or shower. Attach bath mats securely with double-sided, non-slip rug tape. If you need to sit down in the shower, use a  non-slip stool. Install grab bars by the toilet and in the bathtub and shower. Do not use towel bars as grab bars. What can I do in the bedroom? Make sure that you have a light by your bed that is easy to reach. Do not use any sheets or blankets on your bed that hang to the floor. Have a firm chair or bench with side arms that you can use for support when you get dressed. What can I do in the kitchen? Clean up any spills right away. If you need to reach something above you, use a step stool with a grab bar. Keep electrical cords out of the way. Do not use floor polish or wax that makes floors slippery. What can I do with my stairs? Do not leave anything on the stairs. Make sure that you have a light switch at the top and the bottom of the stairs. Make sure that there are handrails on both sides of the stairs. Fix handrails that are broken or loose. Install non-slip stair treads on all your stairs if they do not have carpet. Avoid having throw rugs at the top or bottom of the stairs. Choose a carpet that does not hide the edge of the steps on the stairs. Make sure that the carpet is firmly attached to the stairs. Fix carpet that is loose or worn. What can I do on the outside of my home? Use bright outdoor lighting. Fix the edges of walkways and driveways and fix any cracks. Clear paths of anything that can make you trip, such as tools or rocks. Add color or contrast paint or tape to clearly mark and help you see anything that might make you trip as you walk through a door, such as a raised step or threshold. Trim any bushes or trees on paths to your home. Check to see if handrails are loose or broken and that both sides of all steps have handrails. Install guardrails along the edges of any raised decks and porches. Have leaves, snow, or ice cleared regularly. Use sand, salt, or ice melter on paths if you live where there is ice and snow during the winter. Clean up any spills in your garage  right away. This includes grease or oil spills. What other actions can I take? Review your medicines with your doctor. Some medicines can cause dizziness or changes in blood pressure, which increase your risk of falling. Wear shoes that: Have a low heel. Do not wear high heels. Have rubber bottoms and are closed at the toe. Feel good on your feet  and fit well. Use tools that help you move around if needed. These include: Canes. Walkers. Scooters. Crutches. Ask your doctor what else you can do to help prevent falls. This may include seeing a physical therapist to learn to do exercises to move better and get stronger. Where to find more information Centers for Disease Control and Prevention, [place]: StoreMirror.com.cy [employer] on Aging: AquariamTheater.co.nz National Institute on Aging: AquariamTheater.co.nz Contact a doctor if: You are afraid of falling at home. You feel weak, drowsy, or dizzy at home. You fall at home. Get help right away if you: Lose consciousness or have trouble moving after a fall. Have a fall that causes a head injury. These symptoms may be an emergency. Get help right away. Call 911. Do not wait to see if the symptoms will go away. Do not drive yourself to the hospital. This information is not intended to replace advice given to you by your health care provider. Make sure you discuss any questions you have with your health care provider. Document Revised: 01/18/2022 Document Reviewed: 01/18/2022 Elsevier Patient Education  [place] Maintenance, Female Adopting a healthy lifestyle and getting preventive care are important in promoting health and wellness. Ask your health care provider about: The right schedule for you to have regular tests and exams. Things you can do on your own to prevent diseases and keep yourself healthy. What should I know about diet, weight, and exercise? Eat a healthy diet  Eat a diet that includes plenty of vegetables, fruits, low-fat  dairy products, and lean protein. Do not eat a lot of foods that are high in solid fats, added sugars, or sodium. Maintain a healthy weight Body mass index (BMI) is used to identify weight problems. It estimates body fat based on height and weight. Your health care provider can help determine your BMI and help you achieve or maintain a healthy weight. Get regular exercise Get regular exercise. This is one of the most important things you can do for your health. Most adults should: Exercise for at least 150 minutes each week. The exercise should increase your heart rate and make you sweat (moderate-intensity exercise). Do strengthening exercises at least twice a week. This is in addition to the moderate-intensity exercise. Spend less time sitting. Even light physical activity can be beneficial. Watch cholesterol and blood lipids Have your blood tested for lipids and cholesterol at 56 years of age, then have this test every 5 years. Have your cholesterol levels checked more often if: Your lipid or cholesterol levels are high. You are older than 56 years of age. You are at high risk for heart disease. What should I know about cancer screening? Depending on your health history and family history, you may need to have cancer screening at various ages. This may include screening for: Breast cancer. Cervical cancer. Colorectal cancer. Skin cancer. Lung cancer. What should I know about heart disease, diabetes, and high blood pressure? Blood pressure and heart disease High blood pressure causes heart disease and increases the risk of stroke. This is more likely to develop in people who have high blood pressure readings or are overweight. Have your blood pressure checked: Every 3-5 years if you are 43-60 years of age. Every year if you are 36 years old or older. Diabetes Have regular diabetes screenings. This checks your fasting blood sugar level. Have the screening done: Once every three years  after age 93 if you are at a normal weight and have a low  risk for diabetes. More often and at a younger age if you are overweight or have a high risk for diabetes. What should I know about preventing infection? Hepatitis B If you have a higher risk for hepatitis B, you should be screened for this virus. Talk with your health care provider to find out if you are at risk for hepatitis B infection. Hepatitis C Testing is recommended for: Everyone born from [birth year] through 1964. Anyone with known risk factors for hepatitis C. Sexually transmitted infections (STIs) Get screened for STIs, including gonorrhea and chlamydia, if: You are sexually active and are younger than 56 years of age. You are older than 56 years of age and your health care provider tells you that you are at risk for this type of infection. Your sexual activity has changed since you were last screened, and you are at increased risk for chlamydia or gonorrhea. Ask your health care provider if you are at risk. Ask your health care provider about whether you are at high risk for HIV. Your health care provider may recommend a prescription medicine to help prevent HIV infection. If you choose to take medicine to prevent HIV, you should first get tested for HIV. You should then be tested every 3 months for as long as you are taking the medicine. Pregnancy If you are about to stop having your period (premenopausal) and you may become pregnant, seek counseling before you get pregnant. Take 400 to 800 micrograms (mcg) of folic acid every day if you become pregnant. Ask for birth control (contraception) if you want to prevent pregnancy. Osteoporosis and menopause Osteoporosis is a disease in which the bones lose minerals and strength with aging. This can result in bone fractures. If you are 85 years old or older, or if you are at risk for osteoporosis and fractures, ask your health care provider if you should: Be screened for bone loss. Take a  calcium or vitamin D supplement to lower your risk of fractures. Be given hormone replacement therapy (HRT) to treat symptoms of menopause. Follow these instructions at home: Alcohol use Do not drink alcohol if: Your health care provider tells you not to drink. You are pregnant, may be pregnant, or are planning to become pregnant. If you drink alcohol: Limit how much you have to: 0-1 drink a day. Know how much alcohol is in your drink. In the [place] one drink equals one 12 oz bottle of beer (355 mL), one 5 oz glass of wine (148 mL), or one 1 oz glass of hard liquor (44 mL). Lifestyle Do not use any products that contain nicotine or tobacco. These products include cigarettes, chewing tobacco, and vaping devices, such as e-cigarettes. If you need help quitting, ask your health care provider. Do not use street drugs. Do not share needles. Ask your health care provider for help if you need support or information about quitting drugs. General instructions Schedule regular health, dental, and eye exams. Stay current with your vaccines. Tell your health care provider if: You often feel depressed. You have ever been abused or do not feel safe at home. Summary Adopting a healthy lifestyle and getting preventive care are important in promoting health and wellness. Follow your health care provider's instructions about healthy diet, exercising, and getting tested or screened for diseases. Follow your health care provider's instructions on monitoring your cholesterol and blood pressure. This information is not intended to replace advice given to you by your health care provider. Make sure you discuss any  questions you have with your health care provider. Document Revised: 10/06/2020 Document Reviewed: 10/06/2020 Elsevier Patient Education  [place] A mammogram is an X-ray of the breasts. This procedure can screen for and detect any changes that may indicate breast cancer.  Mammograms are regularly done beginning at age 94 for women with average risk. A man may have a mammogram if he has a lump or swelling in his breast tissue. A mammogram can also identify other changes and variations in the breast, such as: Inflammation of the breast tissue (mastitis). An infected area that contains a collection of pus (abscess). A fluid-filled sac (cyst). Tumors that are not cancerous (benign). Fibrocystic changes. This is when breast tissue becomes denser and can make the tissue feel rope-like or uneven under the skin. Women at higher risk for breast cancer need earlier and more comprehensive screening for abnormal changes. Breast tomosynthesis, or three-dimensional (3D) mammography, and digital breast tomosynthesis are advanced forms of imaging that create 3D pictures of the breasts. Tell a health care provider: About any allergies you have. If you have breast implants. If you have had previous breast disease, biopsy, or surgery. If you have a family history of breast cancer. If you are breastfeeding. Whether you are pregnant or may be pregnant. What are the risks? Generally, this is a safe procedure. However, problems may occur, including: Exposure to radiation. Radiation levels are very low with this test. The need for more tests. The mammogram fails to detect certain cancers or the results are misinterpreted. Difficulty with detecting breast cancer in women with dense breasts. What happens before the procedure? Schedule your test about 1-2 weeks after your menstrual period if you are menstruating. This is usually when your breasts are the least tender. If you have had a mammogram done at a different facility in the past, get the mammogram X-rays or have them sent to your current exam facility. The new and old images will be compared. Wash your breasts and underarms on the day of the test. Do not wear deodorants, perfumes, lotions, or powders anywhere on your body on the  day of the test. Remove any jewelry from your neck. Wear clothes that you can change into and out of easily. What happens during the procedure?  You will undress from the waist up and put on a gown that opens in the front. You will stand in front of the X-ray machine. Each breast will be placed between two plastic or glass plates. The plates will compress your breast for a few seconds. Try to stay as relaxed as possible. This procedure does not cause any harm to your breasts. Any discomfort you feel will be very brief. X-rays will be taken from different angles of each breast. The procedure may vary among health care providers and hospitals. What can I expect after the procedure? The mammogram will be examined by a specialist (radiologist). You may need to repeat certain parts of the test, depending on the quality of the images. This is done if the radiologist needs a better view of the breast tissue. You may resume your normal activities. It is up to you to get the results of your procedure. Ask your health care provider, or the department that is doing the procedure, when your results will be ready. Summary A mammogram is an X-ray of the breasts. This procedure can screen for and detect any changes that may indicate breast cancer. A man may have a mammogram if he has a  lump or swelling in his breast tissue. If you have had a mammogram done at a different facility in the past, get the mammogram X-rays or have them sent to your current exam facility in order to compare them. Schedule your test about 1-2 weeks after your menstrual period if you are menstruating. Ask when your test results will be ready. Make sure you get your test results. This information is not intended to replace advice given to you by your health care provider. Make sure you discuss any questions you have with your health care provider. Document Revised: 01/28/2021 Document Reviewed: 03/17/2020 Elsevier Patient Education   [employer].

## 2022-08-18 NOTE — Progress Notes (Signed)
Subjective:    Shelby Colon is a 57 y.o. female who presents for a Welcome to Medicare exam.   Cardiac Risk Factors include: hypertension      Objective:    Today's Vitals   08/18/22 1532  BP: 118/80  Temp: 98.2 F (36.8 C)  TempSrc: Oral  Weight: 147 lb 12.8 oz (67 kg)  Height: 5\' 2"  (1.575 m)  Body mass index is 27.03 kg/m.  Medications Outpatient Encounter Medications as of 08/18/2022  Medication Sig   amLODipine (NORVASC) 5 MG tablet Take 1 tablet (5 mg total) by mouth daily.   Cetirizine HCl (ZYRTEC ALLERGY) 10 MG CAPS Take 1 capsule (10 mg total) by mouth daily.   cyclobenzaprine (FLEXERIL) 10 MG tablet Take 1 tablet (10 mg total) by mouth 3 (three) times daily as needed for muscle spasms. This can make you sleepy.   DULoxetine (CYMBALTA) 60 MG capsule Take 60 mg by mouth daily.   fluticasone (FLONASE) 50 MCG/ACT nasal spray Place 2 sprays into both nostrils daily.   metoprolol succinate (TOPROL XL) 25 MG 24 hr tablet Take 1 tablet (25 mg total) by mouth daily.   pregabalin (LYRICA) 50 MG capsule TAKE ONE CAPSULE BY MOUTH TWICE A DAY AND TAKE ONE CAPSULE BY MOUTH AT BEDTIME   VITAMIN D-VITAMIN K PO Take by mouth.   No facility-administered encounter medications on file as of 08/18/2022.     History: Past Medical History:  Diagnosis Date   Abnormal MRI, cervical spine 10/17/2019   FINDINGS: Alignment: Reversal of the normal cervical lordosis with apex at C3-4. Trace retrolisthesis of C4 on C5, C5 on C6, and C6 on C7, with anterolisthesis of C7 on T1. Findings chronic and facet mediated.   Vertebrae: Vertebral body height maintained without evidence for acute or chronic fracture. Bone marrow signal intensity within normal limits. No discrete or worrisome osseous lesions. Pro   Abnormal Pap smear of cervix    h/o LSIL pap with HPV +; 01/2019 pap negative negative HPV   Alcohol use 02/09/2022   Altered mental status 12/08/2021   Altered mental status 12/08/2021    Annual physical exam 06/12/2020   Anxiety    Anxiety 06/12/2020   Anxiety and depression 11/30/2017   Atrial fibrillation ([place])    Atrophy of right kidney 03/05/2019   B12 deficiency    Benign neoplasm of ascending colon    Brain cyst 12/10/2021   Cardiac murmur 02/14/2018   Chronic knee pain (Bilateral) 09/07/2018   09/21/2018 left total knee ortho [place] [facility]    Chronic pain syndrome    Colon polyps    Confusion 12/10/2021   Congenital absence of right kidney    Degeneration of lumbar intervertebral disc    Depression    Early menopause    Elevated alkaline phosphatase level 02/23/2019   Gallstones    Gastric bypass status for obesity    2013, weight 258lb   Headache    Heart murmur    MVP   History of chicken pox    HPV in female 11/30/2017   Hypercholesterolemia    Hypertension    Iron deficiency    Iron deficiency anemia 03/02/2019   Neuromuscular disorder (HCC)    Neuropathy    Personal history of colonic polyps    Pharmacologic therapy 10/17/2019   Polyp of sigmoid colon    Problems influencing health status 10/17/2019   Rheumatoid arthritis ([place])    as child    Scoliosis  of lumbar spine    Solitary kidney, congenital    pt thinks has left kidney only no right kidney   Thrombocytosis 02/23/2019   Thyroid disease    in the past    UTI (urinary tract infection)    Vitamin D deficiency    Past Surgical History:  Procedure Laterality Date   ANTERIOR CERVICAL DECOMPRESSION/DISCECTOMY FUSION 4 LEVELS N/A 11/28/2019   Procedure: ANTERIOR CERVICAL DECOMPRESSION/DISCECTOMY FUSION 4 LEVELS C3-7;  Surgeon: Meade Maw, MD;  Location: [facility] ORS;  Service: Neurosurgery;  Laterality: N/A;   AUGMENTATION MAMMAPLASTY Bilateral 2009   BACK SURGERY  11/04/2014   fusion of spine of lumbosacral region 2 rods and 16 screws    BREAST ENHANCEMENT SURGERY     COLONOSCOPY WITH PROPOFOL N/A 01/06/2018   Procedure: COLONOSCOPY WITH PROPOFOL;   Surgeon: Virgel Manifold, MD;  Location: [facility] ENDOSCOPY;  Service: Endoscopy;  Laterality: N/A;   COLONOSCOPY WITH PROPOFOL N/A 03/19/2019   Procedure: COLONOSCOPY WITH PROPOFOL;  Surgeon: Virgel Manifold, MD;  Location: [facility] ENDOSCOPY;  Service: Endoscopy;  Laterality: N/A;   COSMETIC SURGERY     excess skin removal     JOINT REPLACEMENT  09/20/2008   both knees done right was last year and left just done this   KNEE ARTHROSCOPY Bilateral    x 7 knees b/l (2 surgeries on right knee) total 1 bone graft and 5 arthroscopy total knee    ROUX-EN-Y PROCEDURE     SPINE SURGERY  11/07/2014   2 rods and 16 screws   TOTAL KNEE ARTHROPLASTY     left 09/21/18 [name] [name]   TOTAL KNEE ARTHROPLASTY     right knee Dr. Lavonia Drafts     Family History  Adopted: Yes  Problem Relation Age of Onset   Arthritis Mother    Drug abuse Mother        heriod OD   Early death Mother    Diabetes Father    Hypertension Father    Depression Father    Arthritis Father    Drug abuse Father    Diabetes Sister    Fibromyalgia Sister    Rashes / Skin problems Sister        PSORIASIS   Diabetes Brother    Osteoarthritis Maternal Grandmother    Aneurysm Maternal Aunt    Thyroid disease Maternal Aunt    Fibromyalgia Maternal Aunt    Breast cancer Maternal Aunt    Cancer Maternal Aunt        breast cancer    Social History   Occupational History   Not on file  Tobacco Use   Smoking status: Never   Smokeless tobacco: Never   Tobacco comments:    Never smoked  Vaping Use   Vaping Use: Never used  Substance and Sexual Activity   Alcohol use: Not Currently    Comment: SOCIALLY. BUT JUST STOP.   Drug use: Never   Sexual activity: Not Currently    Tobacco Counseling Counseling given: Not Answered Tobacco comments: Never smoked   Immunizations and Health Maintenance Immunization History  Administered Date(s) Administered   Influenza,inj,Quad PF,6+ Mos  06/10/2020   Influenza-Unspecified 04/14/2019   PFIZER(Purple Top)SARS-COV-2 Vaccination 12/22/2019, 01/12/2020   Td 02/01/2017   Tdap 02/01/2017   Health Maintenance Due  Topic Date Due   Zoster Vaccines- Shingrix (1 of 2) Never done   PAP SMEAR-Modifier  02/11/2022    Activities of Daily Living    08/18/2022  4:10 PM 02/10/2022    6:59 AM  In your present state of health, do you have any difficulty performing the following activities:  Hearing? 0   Vision? 0   Difficulty concentrating or making decisions? 0   Walking or climbing stairs? 0   Dressing or bathing? 0   Doing errands, shopping? 0 0  Preparing Food and eating ? Y   Comment standing   Using the Toilet? N   In the past six months, have you accidently leaked urine? N   Do you have problems with loss of bowel control? Y   Managing your Medications? N   Managing your Finances? Y   Housekeeping or managing your Housekeeping? N     Advanced Directives:  None    Assessment:    This is a routine wellness examination for this patient .   Vision/Hearing screen Hearing Screening  Method: Otoacoustic emissions   125Hz  250Hz  500Hz  1000Hz   Right ear Pass Pass Pass Pass  Left ear Pass Pass Pass Pass   Vision Screening   Right eye Left eye Both eyes  Without correction 20/20 20/20 20/20   With correction       Dietary issues and exercise activities discussed:  Current Exercise Habits: Home exercise routine, Type of exercise: Other - see comments (PT exercises), Time (Minutes): 30, Frequency (Times/Week): 1, Weekly Exercise (Minutes/Week): 30, Intensity: Mild, Exercise limited by: Other - see comments (legs and back)   Goals   None    Depression Screen    08/18/2022    3:48 PM 08/10/2022    4:33 PM 07/12/2022   11:06 AM 06/23/2022   10:07 AM  PHQ 2/9 Scores  PHQ - 2 Score 4  3 6   PHQ- 9 Score 15  9 17      Information is confidential and restricted. Go to Review Flowsheets to unlock data.     Fall  Risk    07/12/2022   11:06 AM  Fall Risk   Falls in the past year? 0  Number falls in past yr: 0  Injury with Fall? 0  Risk for fall due to : No Fall Risks  Follow up Falls evaluation completed    Cognitive Function:        08/18/2022    4:07 PM  6CIT Screen  What Year? 0 points  What month? 0 points  What time? 0 points  Count back from 20 0 points  Months in reverse 0 points  Repeat phrase 0 points  Total Score 0 points    Patient Care Team: Carollee Leitz, MD as PCP - General (Family Medicine) Kate Sable, MD as PCP - Cardiology (Cardiology)     Plan:     I have personally reviewed and noted the following in the patient's chart:   Medical and social history Use of alcohol, tobacco or illicit drugs  Current medications and supplements Functional ability and status Nutritional status Physical activity Advanced directives List of other physicians Hospitalizations, surgeries, and ER visits in previous 12 months Vitals Screenings to include cognitive, depression, and falls Referrals and appointments  In addition, I have reviewed and discussed with patient certain preventive protocols, quality metrics, and best practice recommendations. A written personalized care plan for preventive services as well as general preventive health recommendations were provided to patient.     Carollee Leitz, MD 08/18/2022

## 2022-08-21 ENCOUNTER — Encounter: Payer: Self-pay | Admitting: Family Medicine

## 2022-08-25 DIAGNOSIS — M25541 Pain in joints of right hand: Secondary | ICD-10-CM | POA: Diagnosis not present

## 2022-08-25 DIAGNOSIS — G5601 Carpal tunnel syndrome, right upper limb: Secondary | ICD-10-CM | POA: Diagnosis not present

## 2022-08-25 DIAGNOSIS — S60021A Contusion of right index finger without damage to nail, initial encounter: Secondary | ICD-10-CM | POA: Diagnosis not present

## 2022-08-25 DIAGNOSIS — M25542 Pain in joints of left hand: Secondary | ICD-10-CM | POA: Diagnosis not present

## 2022-08-25 DIAGNOSIS — M1812 Unilateral primary osteoarthritis of first carpometacarpal joint, left hand: Secondary | ICD-10-CM | POA: Diagnosis not present

## 2022-08-30 ENCOUNTER — Encounter: Payer: Self-pay | Admitting: Family Medicine

## 2022-08-31 ENCOUNTER — Ambulatory Visit (INDEPENDENT_AMBULATORY_CARE_PROVIDER_SITE_OTHER): Payer: Medicare HMO | Admitting: Licensed Clinical Social Worker

## 2022-08-31 DIAGNOSIS — F4310 Post-traumatic stress disorder, unspecified: Secondary | ICD-10-CM

## 2022-08-31 MED ORDER — CYCLOBENZAPRINE HCL 10 MG PO TABS: 10.0000 mg | ORAL_TABLET | Freq: Three times a day (TID) | ORAL | 0 refills | Status: DC | PRN

## 2022-08-31 NOTE — Telephone Encounter (Signed)
Left message for Jazmin at Dr. Thornton Park' office for a call back regarding referral.

## 2022-08-31 NOTE — Addendum Note (Signed)
Addended by: Berdine Addison on: 08/31/2022 12:18 PM   Modules accepted: Orders

## 2022-09-06 DIAGNOSIS — G5601 Carpal tunnel syndrome, right upper limb: Secondary | ICD-10-CM | POA: Diagnosis not present

## 2022-09-06 DIAGNOSIS — M1812 Unilateral primary osteoarthritis of first carpometacarpal joint, left hand: Secondary | ICD-10-CM | POA: Diagnosis not present

## 2022-09-10 NOTE — Progress Notes (Signed)
THERAPIST PROGRESS NOTE  Session Time: 1:00PM-2:00PM  Participation Level: Active  Behavioral Response: Casual, Neat, and Well GroomedAlertAnxious  Type of Therapy: Individual Therapy  Treatment Goals addressed:   Develop and implement effective coping skills to carry out normal responsibilities and participate constructively in relationships as evidenced by self report.    Recall traumatic events without becoming overwhelmed with negative emotions  Reduce overall frequency, intensity and duration of depression so that daily functioning is not impaired per pt self report 3 out of 5 sessions documented.   Reduce overall frequency, intensity and duration of anxiety so that daily functioning is not impaired per pt self report 3 out of 5 sessions.   ProgressTowards Goals: Progressing  Interventions: CBT, DBT, Motivational Interviewing, Strength-based, and Other: ACT  Summary: Anndee Connett is a 57 y.o. female who presents with mixed sxs of anxiety and depression due to a hx of trauma. Sxs endorsed including but not limited to isolation, low mood, fatigue, worry, difficulty controlling worry, and irritability. Pt oriented to person, place, and time. Pt denies SI/HI or A/V hallucinations. Pt was cooperative during visit and was engaged throughout the visit. Pt does not report any other concerns at the time of visit.  Pt reported working to enjoy crafting more to make room for self. Pt reported difficulty emotionally due to processing that she cannot do all she used to do. Pt reported working to radically accept where she is at in life now without judgment.   Pt reported noticing that she is forgetful potentially due to low sodium. Encouraged pt to talk to providers about her concerns. Pt reported that forgetfulness has been discouraging because she feels it is making her lose her sharpness that she once had. Pt utilized therapeutic space to process how she has changed. Introduced pt to "blue  sky" qualities-things that are always true about self no matter what clouds are in the sky of life. Discussed how people identify with their temporary clouds as opposed to their consistent blue sky, because the clouds block their view of their true self. Invited pt to explore their blue sky qualities to identify areas where pt has not lost self and areas where pt has grown.   Pt reported that she is continuing to work on listening to her body and relearning hunger cues.   Pt named interpersonal distress at home with husband due to feeling belittled and not trusted. Identified conflict pattern of pt vs her husband vs the problem. Assisted pt in identifying ways to align with husband and shift conflict pattern to being her and her husband vs the problem. Pt reported feeling misunderstood  and undersupported. Encouraged pt to practice listening to her body and show how care improves functioning as opposed to trying to communicate how pt feels better to husband. Discussed power of showing vs telling. Pt identified goals of improving marriage going forward.   Pt identified difficulty sleeping at night due to mind not turning off. Provided pscyhoed re: sleep hygiene.     LCSW provided mood monitoring and treatment progress review in the context of this episode of treatment. LCSW reviewed the pt's mood status since last session.   Pt is continuing to apply interventions/techniques learned in session into daily life situations. Pt is currently on track to meet goals utilizing interventions that are discussed in session. Treatment to continue as indicated. Personal growth and progress toward goals noted above.  Continued Recommendations as followed: Self-care behaviors, positive social engagements, focusing on positive physical and  emotional wellness, and focusing on life/work balance.    Suicidal/Homicidal: Nowithout intent/plan  Therapist Response:  Provided pt education re: acceptance. Discussed how to  make acceptance accessible at all parts of pt's healing journey.   Approached pt with strengths based perspective to assist pt in exploring strengths in moments of feeling low.   LCSW practiced active listening to validate pt participation, build rapport, and create safe space for pt to feel heard as they are disclosing their thoughts and feelings.   LCSW utilized therapeutic conversation skills informed by CBT, DBT, and ACT to expose pt to multiple ways of thinking about healing and to provide pt to access to multiple interventions.  Explored the space between stimulus and response to assist pt in finding space to control emotions and choose his response to the stimulus that risks increasing pt emotional reactivity.    Plan: Return again in 2 weeks.  Diagnosis: PTSD (post-traumatic stress disorder)    08/31/2022    3:26 PM 08/10/2022    4:33 PM 07/12/2022   11:06 AM 06/23/2022   10:08 AM  GAD 7 : Generalized Anxiety Score  Nervous, Anxious, on Edge 1 2 1  0  Control/stop worrying 2 3 3  0  Worry too much - different things 2 3 2 2   Trouble relaxing 0 1 1 0  Restless 0 0 0 0  Easily annoyed or irritable 2 3 3 3   Afraid - awful might happen 0 0 1 0  Total GAD 7 Score 7 12 11 5   Anxiety Difficulty Somewhat difficult Very difficult Very difficult Very difficult       08/31/2022    3:25 PM 08/18/2022    3:48 PM 08/10/2022    4:33 PM  Depression screen PHQ 2/9  Decreased Interest 2 2 3   Down, Depressed, Hopeless 3 2 2   PHQ - 2 Score 5 4 5   Altered sleeping 3 3 2   Tired, decreased energy 2 2 3   Change in appetite 3 3 3   Feeling bad or failure about yourself  2 3 3   Trouble concentrating 1 0 1  Moving slowly or fidgety/restless 0 0 0  Suicidal thoughts 0 0 0  PHQ-9 Score 16 15 17   Difficult doing work/chores Somewhat difficult Somewhat difficult Somewhat difficult    Collaboration of Care: Other None at this time  Patient/Guardian was advised Release of Information must be  obtained prior to any record release in order to collaborate their care with an outside provider. Patient/Guardian was advised if they have not already done so to contact the registration department to sign all necessary forms in order for [place] to release information regarding their care.   Consent: Patient/Guardian gives verbal consent for treatment and assignment of benefits for services provided during this visit. Patient/Guardian expressed understanding and agreed to proceed.   Geoffry Paradise, LCSW 09/10/2022

## 2022-09-15 ENCOUNTER — Telehealth (INDEPENDENT_AMBULATORY_CARE_PROVIDER_SITE_OTHER): Payer: Medicare HMO | Admitting: Licensed Clinical Social Worker

## 2022-09-15 DIAGNOSIS — F4310 Post-traumatic stress disorder, unspecified: Secondary | ICD-10-CM | POA: Diagnosis not present

## 2022-09-16 ENCOUNTER — Ambulatory Visit (INDEPENDENT_AMBULATORY_CARE_PROVIDER_SITE_OTHER): Payer: Medicare HMO

## 2022-09-16 DIAGNOSIS — Z23 Encounter for immunization: Secondary | ICD-10-CM | POA: Diagnosis not present

## 2022-09-16 NOTE — Progress Notes (Signed)
Patient presented for Hep B  injection to left deltoid, patient voiced no concerns nor showed any signs of distress during injection. Pt also wanted to inform Provider that she has gained 20 lbs in 3 weeks she thinks it is because of the BP medication. I informed her to make an appt to come in and discuss.

## 2022-09-23 ENCOUNTER — Other Ambulatory Visit: Payer: Self-pay | Admitting: Neurosurgery

## 2022-09-23 ENCOUNTER — Encounter: Payer: Self-pay | Admitting: Family Medicine

## 2022-09-23 DIAGNOSIS — M7918 Myalgia, other site: Secondary | ICD-10-CM

## 2022-09-23 DIAGNOSIS — M4325 Fusion of spine, thoracolumbar region: Secondary | ICD-10-CM

## 2022-09-23 DIAGNOSIS — Z981 Arthrodesis status: Secondary | ICD-10-CM

## 2022-09-23 MED ORDER — CYCLOBENZAPRINE HCL 10 MG PO TABS: 10.0000 mg | ORAL_TABLET | Freq: Three times a day (TID) | ORAL | 0 refills | Status: DC | PRN

## 2022-09-26 NOTE — Progress Notes (Deleted)
   SUBJECTIVE:   No chief complaint on file.  HPI Patient presents to clinic for follow-up high blood pressure. Is not taking blood pressure readings at home. Denies any visual changes, headaches, chest pain, shortness of breath or lower extremity edema. Tolerating amlodipine well.    PERTINENT PMH / PSH: Hypertension  OBJECTIVE:  There were no vitals taken for this visit.   Physical Exam  ASSESSMENT/PLAN:  There are no diagnoses linked to this encounter.  PDMP reviewed  No follow-ups on file.  Dana Allan, MD

## 2022-09-27 ENCOUNTER — Ambulatory Visit: Payer: Medicare HMO | Admitting: Family Medicine

## 2022-09-27 DIAGNOSIS — R7989 Other specified abnormal findings of blood chemistry: Secondary | ICD-10-CM

## 2022-09-27 DIAGNOSIS — D649 Anemia, unspecified: Secondary | ICD-10-CM

## 2022-09-27 DIAGNOSIS — I10 Essential (primary) hypertension: Secondary | ICD-10-CM

## 2022-09-28 ENCOUNTER — Ambulatory Visit: Payer: Medicare HMO | Admitting: Licensed Clinical Social Worker

## 2022-09-29 NOTE — Progress Notes (Signed)
THERAPIST PROGRESS NOTE  Session Time: 1:01PM-2:01PM  Participation Level: Active  Behavioral Response: Casual, Neat, and Well GroomedAlertAnxious  Type of Therapy: Individual Therapy  Treatment Goals addressed: Same goals addressed during this appt as previous appt  Develop and implement effective coping skills to carry out normal responsibilities and participate constructively in relationships as evidenced by self report.    Recall traumatic events without becoming overwhelmed with negative emotions  Reduce overall frequency, intensity and duration of depression so that daily functioning is not impaired per pt self report 3 out of 5 sessions documented.   Reduce overall frequency, intensity and duration of anxiety so that daily functioning is not impaired per pt self report 3 out of 5 sessions.   ProgressTowards Goals: Progressing  Interventions: CBT, DBT, Motivational Interviewing, Strength-based, and Other: ACT  Virtual Visit via Video Note  I connected with Shelby Colon on 09/15/2022 at  1:00 PM EDT by a video enabled telemedicine application and verified that I am speaking with the correct person using two identifiers.  Location: Patient: located in pt home Provider: working remotely in [place], [place]   I discussed the limitations of evaluation and management by telemedicine and the availability of in person appointments. The patient expressed understanding and agreed to proceed.  I discussed the assessment and treatment plan with the patient. The patient was provided an opportunity to ask questions and all were answered. The patient agreed with the plan and demonstrated an understanding of the instructions.   The patient was advised to call back or seek an in-person evaluation if the symptoms worsen or if the condition fails to improve as anticipated.  I provided 60 minutes of non-face-to-face time during this encounter.   Geoffry Paradise, LCSW  Summary: Shelby Colon is  a 57 y.o. female who presents with mixed sxs of anxiety and depression due to a hx of trauma. Sxs endorsed including but not limited to isolation, low mood, fatigue, worry, difficulty controlling worry, and irritability. Pt oriented to person, place, and time. Pt denies SI/HI or A/V hallucinations. Pt was cooperative during visit and was engaged throughout the visit. Pt does not report any other concerns at the time of visit. This information has been reviewed and remains accurate to pt experience.   Pt reported that she has been crafting a lot for self care.   Pt reported that she has noticed her blood pressure medication causing her to gain weight. Invited pt to talk to her med providers re: changes.   Pt reported that she reviewed her values inventory and learned that she is not living in alignment with her core values. Discussed impacts on mental health that result from not living in alignment with core values.   Pt utilized therapeutic space to process past trauma and reported curiosity re: why she was treated the way she was when she stood up for herself. Discussed impacts of changing the power dynamic. Pt explored how the power dynamic was shifted by setting boundaries. Validated that pt is expert on her own experience.   Pt identified goals for self to improve her family, marriage, spirituality, and recreation. Pt explored small ways she can begin to pursue improvements in these areas of her life.    Pt engaged in discussion on values directed goal exploration. Pt identified values in therapy and barriers to aligning with values. Pt explored ways to overcome barriers to values driven goal pursuit. Provided pt validation of normalcy of values changing as self changes. Invited client  to reflect on how values grow and shift with self growth and shifts. Educated pt on cognitive dissonance that occurs with engaging in behaviors that contradict values.   LCSW provided mood monitoring and treatment  progress review in the context of this episode of treatment. LCSW reviewed the pt's mood status since last session.   Pt is continuing to apply interventions/techniques learned in session into daily life situations. Pt is currently on track to meet goals utilizing interventions that are discussed in session. Treatment to continue as indicated. Personal growth and progress toward goals noted above.  Continued Recommendations as followed: Self-care behaviors, positive social engagements, focusing on positive physical and emotional wellness, and focusing on life/work balance.    Suicidal/Homicidal: Nowithout intent/plan  Therapist Response:  Provided pt education re: acceptance. Discussed how to make acceptance accessible at all parts of pt's healing journey.   Approached pt with strengths based perspective to assist pt in exploring strengths in moments of feeling low.   LCSW practiced active listening to validate pt participation, build rapport, and create safe space for pt to feel heard as they are disclosing their thoughts and feelings.   LCSW utilized therapeutic conversation skills informed by CBT, DBT, and ACT to expose pt to multiple ways of thinking about healing and to provide pt to access to multiple interventions.  Introduced pt to "blue sky" qualities-things that are always true about self no matter what clouds are in the sky of life. Discussed how people identify with their temporary clouds as opposed to their consistent blue sky, because the clouds block their view of their true self. Invited pt to explore their blue sky qualities to identify areas where pt has not lost self and areas where pt has grown.    Plan: Return again in 4 weeks.  Diagnosis: PTSD (post-traumatic stress disorder)    08/31/2022    3:26 PM 08/10/2022    4:33 PM 07/12/2022   11:06 AM 06/23/2022   10:08 AM  GAD 7 : Generalized Anxiety Score  Nervous, Anxious, on Edge 1 2 1  0  Control/stop worrying 2 3 3  0   Worry too much - different things 2 3 2 2   Trouble relaxing 0 1 1 0  Restless 0 0 0 0  Easily annoyed or irritable 2 3 3 3   Afraid - awful might happen 0 0 1 0  Total GAD 7 Score 7 12 11 5   Anxiety Difficulty Somewhat difficult Very difficult Very difficult Very difficult       08/31/2022    3:25 PM 08/18/2022    3:48 PM 08/10/2022    4:33 PM  Depression screen PHQ 2/9  Decreased Interest 2 2 3   Down, Depressed, Hopeless 3 2 2   PHQ - 2 Score 5 4 5   Altered sleeping 3 3 2   Tired, decreased energy 2 2 3   Change in appetite 3 3 3   Feeling bad or failure about yourself  2 3 3   Trouble concentrating 1 0 1  Moving slowly or fidgety/restless 0 0 0  Suicidal thoughts 0 0 0  PHQ-9 Score 16 15 17   Difficult doing work/chores Somewhat difficult Somewhat difficult Somewhat difficult    Collaboration of Care: Other None at this time  Patient/Guardian was advised Release of Information must be obtained prior to any record release in order to collaborate their care with an outside provider. Patient/Guardian was advised if they have not already done so to contact the registration department to sign all necessary forms in  order for [place] to release information regarding their care.   Consent: Patient/Guardian gives verbal consent for treatment and assignment of benefits for services provided during this visit. Patient/Guardian expressed understanding and agreed to proceed.   Geoffry Paradise, LCSW 09/15/2022

## 2022-10-12 ENCOUNTER — Ambulatory Visit (INDEPENDENT_AMBULATORY_CARE_PROVIDER_SITE_OTHER): Payer: Medicare HMO | Admitting: Licensed Clinical Social Worker

## 2022-10-12 DIAGNOSIS — F4310 Post-traumatic stress disorder, unspecified: Secondary | ICD-10-CM

## 2022-10-14 ENCOUNTER — Ambulatory Visit: Payer: Medicare HMO | Admitting: Family Medicine

## 2022-10-20 ENCOUNTER — Other Ambulatory Visit: Payer: Self-pay | Admitting: Orthopedic Surgery

## 2022-10-20 ENCOUNTER — Telehealth: Payer: Self-pay

## 2022-10-20 DIAGNOSIS — M4325 Fusion of spine, thoracolumbar region: Secondary | ICD-10-CM

## 2022-10-20 DIAGNOSIS — M7918 Myalgia, other site: Secondary | ICD-10-CM

## 2022-10-20 DIAGNOSIS — Z981 Arthrodesis status: Secondary | ICD-10-CM

## 2022-10-20 MED ORDER — CYCLOBENZAPRINE HCL 10 MG PO TABS: 10.0000 mg | ORAL_TABLET | Freq: Three times a day (TID) | ORAL | 0 refills | Status: DC | PRN

## 2022-10-20 NOTE — Telephone Encounter (Signed)
Patient states she had started taking a new blood pressure medication and it was causing her to gain weight (20 pounds in one and a half weeks).  Patient states she stopped taking her blood pressure medication a couple of weeks ago but she has only been able to lose a couple of pounds.  Patient states her legs feel heavy and she is experiencing cramps in her legs and arms.  I transferred call to Access Nurse.  Access Nurse states all the nurses are taking calls at the moment, so someone will call her as soon as possible.  I relayed message to patient and let her know that the number they will be calling from may have a different area code.

## 2022-10-20 NOTE — Telephone Encounter (Signed)
She has appt with Dr. Billee Cashing on 10/26/22.   Last seen by me on 08/03/22.   Refill of flexeril okay.   Please let her know it was sent to her pharmacy.

## 2022-10-20 NOTE — Telephone Encounter (Signed)
From: Willey Blade To: Office of Shelby Echevaria Hans[place], [place] Sent: 10/20/2022 9:57 AM EDT Subject: Medication Renewal Request  Refills have been requested for the following medications:   cyclobenzaprine (FLEXERIL) 10 MG tablet Shelby Colon]  Preferred pharmacy: [facility] [id number] - [place], [place] - [address] Delivery method: [employer]

## 2022-10-21 ENCOUNTER — Ambulatory Visit: Payer: Medicare HMO | Admitting: Family Medicine

## 2022-10-21 NOTE — Telephone Encounter (Signed)
Noted  

## 2022-10-21 NOTE — Telephone Encounter (Signed)
Patient notified via voicemail.

## 2022-10-22 ENCOUNTER — Ambulatory Visit (INDEPENDENT_AMBULATORY_CARE_PROVIDER_SITE_OTHER): Payer: Medicare HMO | Admitting: Nurse Practitioner

## 2022-10-22 ENCOUNTER — Encounter: Payer: Self-pay | Admitting: Nurse Practitioner

## 2022-10-22 VITALS — BP 134/66 | HR 87 | Temp 98.1°F | Ht 62.0 in | Wt 153.4 lb

## 2022-10-22 DIAGNOSIS — M62838 Other muscle spasm: Secondary | ICD-10-CM | POA: Diagnosis not present

## 2022-10-22 LAB — COMPREHENSIVE METABOLIC PANEL
ALT: 22 U/L (ref 0–35)
AST: 32 U/L (ref 0–37)
Albumin: 4.1 g/dL (ref 3.5–5.2)
Alkaline Phosphatase: 95 U/L (ref 39–117)
BUN: 20 mg/dL (ref 6–23)
CO2: 30 mEq/L (ref 19–32)
Calcium: 9.3 mg/dL (ref 8.4–10.5)
Chloride: 104 mEq/L (ref 96–112)
Creatinine, Ser: 0.94 mg/dL (ref 0.40–1.20)
GFR: 68.19 mL/min (ref >60.00)
Glucose, Bld: 86 mg/dL (ref 70–99)
Potassium: 4.1 mEq/L (ref 3.5–5.1)
Sodium: 141 mEq/L (ref 135–145)
Total Bilirubin: 0.4 mg/dL (ref 0.2–1.2)
Total Protein: 6.8 g/dL (ref 6.0–8.3)

## 2022-10-22 LAB — MAGNESIUM: Magnesium: 1.8 mg/dL (ref 1.5–2.5)

## 2022-10-22 NOTE — Patient Instructions (Addendum)
Labs ordered. Will call you with the results.

## 2022-10-22 NOTE — Progress Notes (Signed)
Established Patient Office Visit  Subjective:  Patient ID: Shelby Colon, female    DOB: [birth year]  Age: 57 y.o. MRN: [id number]  CC:  Chief Complaint  Patient presents with   Spasms    Muscle spasms    HPI  Shelby Colon presents for muscle spasm and weight gain after starting amlodipine. Patient reports that she has gained 20 lb since she she has been started on amlodipine.  Patient also states that she has stopped taking the blood pressure medication a couple of weeks ago.  Patient states she is experiencing muscle cramps and feels like her legs are heavy.  .   Denies leg pain, shortness of breath or chest pain.    HPI   Past Medical History:  Diagnosis Date   Abnormal MRI, cervical spine 10/17/2019   FINDINGS: Alignment: Reversal of the normal cervical lordosis with apex at C3-4. Trace retrolisthesis of C4 on C5, C5 on C6, and C6 on C7, with anterolisthesis of C7 on T1. Findings chronic and facet mediated.   Vertebrae: Vertebral body height maintained without evidence for acute or chronic fracture. Bone marrow signal intensity within normal limits. No discrete or worrisome osseous lesions. Pro   Abnormal Pap smear of cervix    h/o LSIL pap with HPV +; 01/2019 pap negative negative HPV   Alcohol use 02/09/2022   Altered mental status 12/08/2021   Altered mental status 12/08/2021   Annual physical exam 06/12/2020   Anxiety    Anxiety 06/12/2020   Anxiety and depression 11/30/2017   Atrial fibrillation (HCC)    Atrophy of right kidney 03/05/2019   B12 deficiency    Benign neoplasm of ascending colon    Brain cyst 12/10/2021   Cardiac murmur 02/14/2018   Chronic knee pain (Bilateral) 09/07/2018   09/21/2018 left total knee ortho [place] Dr. [facility]    Chronic pain syndrome    Colon polyps    Confusion 12/10/2021   Congenital absence of right kidney    Degeneration of lumbar intervertebral disc    Depression    Early menopause    Elevated  alkaline phosphatase level 02/23/2019   Gallstones    Gastric bypass status for obesity    2013, weight 258lb   Headache    Heart murmur    MVP   History of chicken pox    HPV in female 11/30/2017   Hypercholesterolemia    Hypertension    Iron deficiency    Iron deficiency anemia 03/02/2019   Neuromuscular disorder (HCC)    Neuropathy    Personal history of colonic polyps    Pharmacologic therapy 10/17/2019   Polyp of sigmoid colon    Problems influencing health status 10/17/2019   Rheumatoid arthritis (HCC)    as child    Scoliosis of lumbar spine    Solitary kidney, congenital    pt thinks has left kidney only no right kidney   Thrombocytosis 02/23/2019   Thyroid disease    in the past    UTI (urinary tract infection)    Vitamin D deficiency     Past Surgical History:  Procedure Laterality Date   ANTERIOR CERVICAL DECOMPRESSION/DISCECTOMY FUSION 4 LEVELS N/A 11/28/2019   Procedure: ANTERIOR CERVICAL DECOMPRESSION/DISCECTOMY FUSION 4 LEVELS C3-7;  Surgeon: Venetia Night, MD;  Location: [facility] ORS;  Service: Neurosurgery;  Laterality: N/A;   AUGMENTATION MAMMAPLASTY Bilateral 2009   BACK SURGERY  11/04/2014   fusion of spine of lumbosacral region 2 rods and 16 screws  BREAST ENHANCEMENT SURGERY     COLONOSCOPY WITH PROPOFOL N/A 01/06/2018   Procedure: COLONOSCOPY WITH PROPOFOL;  Surgeon: Pasty Spillers, MD;  Location: [facility] ENDOSCOPY;  Service: Endoscopy;  Laterality: N/A;   COLONOSCOPY WITH PROPOFOL N/A 03/19/2019   Procedure: COLONOSCOPY WITH PROPOFOL;  Surgeon: Pasty Spillers, MD;  Location: [facility] ENDOSCOPY;  Service: Endoscopy;  Laterality: N/A;   COSMETIC SURGERY     excess skin removal     JOINT REPLACEMENT  09/20/2008   both knees done right was last year and left just done this   KNEE ARTHROSCOPY Bilateral    x 7 knees b/l (2 surgeries on right knee) total 1 bone graft and 5 arthroscopy total knee    ROUX-EN-Y PROCEDURE     SPINE SURGERY   11/07/2014   2 rods and 16 screws   TOTAL KNEE ARTHROPLASTY     left 09/21/18 Dr. Dema Severin II [place]   TOTAL KNEE ARTHROPLASTY     right knee Dr. Elliot Gurney     Family History  Adopted: Yes  Problem Relation Age of Onset   Arthritis Mother    Drug abuse Mother        heriod OD   Early death Mother    Diabetes Father    Hypertension Father    Depression Father    Arthritis Father    Drug abuse Father    Diabetes Sister    Fibromyalgia Sister    Rashes / Skin problems Sister        PSORIASIS   Diabetes Brother    Osteoarthritis Maternal Grandmother    Aneurysm Maternal Aunt    Thyroid disease Maternal Aunt    Fibromyalgia Maternal Aunt    Breast cancer Maternal Aunt    Cancer Maternal Aunt        breast cancer     Social History   Socioeconomic History   Marital status: Married    Spouse name: alex   Number of children: 0   Years of education: 16   Highest education level: Bachelor's degree (e.g., BA, AB, BS)  Occupational History   Not on file  Tobacco Use   Smoking status: Never   Smokeless tobacco: Never   Tobacco comments:    Never smoked  Vaping Use   Vaping Use: Never used  Substance and Sexual Activity   Alcohol use: Not Currently    Comment: SOCIALLY. BUT JUST STOP.   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    BA degree    No guns, wears seat belt, safe in relationship    Social Determinants of Health   Financial Resource Strain: Medium Risk (10/19/2022)   Overall Financial Resource Strain (CARDIA)    Difficulty of Paying Living Expenses: Somewhat hard  Food Insecurity: No Food Insecurity (10/19/2022)   Hunger Vital Sign    Worried About Running  Out of Food in the Last Year: Never true    Ran Out of Food in the Last Year: Never true  Transportation Needs: No Transportation Needs (10/19/2022)   PRAPARE - Administrator, Civil Service (Medical): No    Lack of Transportation (Non-Medical):  No  Physical Activity: Unknown (10/19/2022)   Exercise Vital Sign    Days of Exercise per Week: Patient declined    Minutes of Exercise per Session: 30 min  Recent Concern: Physical Activity - Insufficiently Active (08/18/2022)   Exercise Vital Sign    Days of Exercise per Week: 1 day    Minutes of Exercise per Session: 30 min  Stress: Stress Concern Present (10/19/2022)   [employer] of Occupational Health - Occupational Stress Questionnaire    Feeling of Stress : To some extent  Social Connections: Moderately Isolated (10/19/2022)   Social Connection and Isolation Panel [NHANES]    Frequency of Communication with Friends and Family: More than three times a week    Frequency of Social Gatherings with Friends and Family: Twice a week    Attends Religious Services: Never    Database administrator or Organizations: No    Attends Banker Meetings: Never    Marital Status: Married  Catering manager Violence: Not At Risk (08/18/2022)   Humiliation, Afraid, Rape, and Kick questionnaire    Fear of Current or Ex-Partner: No    Emotionally Abused: No    Physically Abused: No    Sexually Abused: No     Outpatient Medications Prior to Visit  Medication Sig Dispense Refill   cyclobenzaprine (FLEXERIL) 10 MG tablet Take 1 tablet (10 mg total) by mouth 3 (three) times daily as needed for muscle spasms. This can make you sleepy. 60 tablet 0   DULoxetine (CYMBALTA) 60 MG capsule Take 60 mg by mouth daily.     fluticasone (FLONASE) 50 MCG/ACT nasal spray Place 2 sprays into both nostrils daily. 16 g 6   metoprolol succinate (TOPROL XL) 25 MG 24 hr tablet Take 1 tablet (25 mg total) by mouth daily. 30 tablet 3   pregabalin  (LYRICA) 50 MG capsule TAKE ONE CAPSULE BY MOUTH TWICE A DAY AND TAKE ONE CAPSULE BY MOUTH AT BEDTIME 90 capsule 0   VITAMIN D-VITAMIN K PO Take by mouth.     amLODipine (NORVASC) 5 MG tablet Take 1 tablet (5 mg total) by mouth daily. (Patient not taking: Reported on 10/22/2022) 30 tablet 3   No facility-administered medications prior to visit.    Allergies  Allergen Reactions   Morphine Nausea And Vomiting    ROS Review of Systems  Constitutional: Negative.   HENT: Negative.    Respiratory:  Positive for apnea.   Cardiovascular: Negative.   Musculoskeletal:        Muscle cramps  Neurological: Negative.   Psychiatric/Behavioral: Negative.        Objective:    Physical Exam Constitutional:      Appearance: Normal appearance.  HENT:     Mouth/Throat:     Mouth: Mucous membranes are moist.  Cardiovascular:     Rate and Rhythm: Normal rate and regular rhythm.     Pulses: Normal pulses.     Heart sounds: Normal heart sounds. No murmur heard.    No friction rub.  Pulmonary:     Effort: Pulmonary effort is normal.     Breath sounds: Normal breath sounds. No wheezing.  Musculoskeletal:     Right lower leg: No edema.     Left lower leg: No edema.  Skin:    General: Skin is warm.  Neurological:     General: No focal deficit present.     Mental Status: She is alert and oriented to person, place, and time. Mental status is  at baseline.     BP 134/66   Pulse 87   Temp 98.1 F (36.7 C) (Oral)   Ht 5\' 2"  (1.575 m)   Wt 153 lb 6.4 oz (69.6 kg)   SpO2 99%   BMI 28.06 kg/m  Wt Readings from Last 3 Encounters:  10/22/22 153 lb 6.4 oz (69.6 kg)  08/18/22 147 lb 12.8 oz (67 kg)  08/03/22 140 lb 9.6 oz (63.8 kg)     Health Maintenance  Topic Date Due   Zoster Vaccines- Shingrix (1 of 2) Never done   COVID-19 Vaccine (3 - Pfizer risk series) 02/09/2020   PAP SMEAR-Modifier  02/11/2022   INFLUENZA VACCINE  12/30/2022   Medicare Annual Wellness (AWV)  08/18/2023    Colonoscopy  03/18/2024   MAMMOGRAM  04/20/2024   DTaP/Tdap/Td (3 - Td or Tdap) 02/02/2027   Hepatitis C Screening  Completed   HIV Screening  Completed   HPV VACCINES  Aged Out    There are no preventive care reminders to display for this patient.  Lab Results  Component Value Date   TSH 3.135 02/09/2022   Lab Results  Component Value Date   WBC 6.6 02/10/2022   HGB 11.7 (L) 02/10/2022   HCT 35.1 (L) 02/10/2022   MCV 80.3 02/10/2022   PLT 403 (H) 02/10/2022   Lab Results  Component Value Date   NA 141 10/22/2022   K 4.1 10/22/2022   CO2 30 10/22/2022   GLUCOSE 86 10/22/2022   BUN 20 10/22/2022   CREATININE 0.94 10/22/2022   BILITOT 0.4 10/22/2022   ALKPHOS 95 10/22/2022   AST 32 10/22/2022   ALT 22 10/22/2022   PROT 6.8 10/22/2022   ALBUMIN 4.1 10/22/2022   CALCIUM 9.3 10/22/2022   ANIONGAP 8 02/10/2022   EGFR 99 12/18/2021   GFR 68.19 10/22/2022   Lab Results  Component Value Date   CHOL 184 02/15/2019   Lab Results  Component Value Date   HDL 117 02/15/2019   Lab Results  Component Value Date   LDLCALC 56 02/15/2019   Lab Results  Component Value Date   TRIG 55 02/15/2019   Lab Results  Component Value Date   CHOLHDL 1.6 02/15/2019   Lab Results  Component Value Date   HGBA1C 5.2 02/13/2018      Assessment & Plan:  Muscle spasm Assessment & Plan: Advised patient to gently massage and stretch the affected area. Applying warm compress water showers will help. Will check magnesium and metabolic panel.   Orders: -     Comprehensive metabolic panel -     Magnesium    Follow-up: No follow-ups on file.   Kara Dies, NP

## 2022-10-26 DIAGNOSIS — M5417 Radiculopathy, lumbosacral region: Secondary | ICD-10-CM | POA: Diagnosis not present

## 2022-10-31 DIAGNOSIS — M62838 Other muscle spasm: Secondary | ICD-10-CM | POA: Insufficient documentation

## 2022-10-31 NOTE — Assessment & Plan Note (Signed)
Advised patient to gently massage and stretch the affected area. Applying warm compress water showers will help. Will check magnesium and metabolic panel.

## 2022-11-02 ENCOUNTER — Ambulatory Visit (INDEPENDENT_AMBULATORY_CARE_PROVIDER_SITE_OTHER): Payer: Medicare HMO | Admitting: Licensed Clinical Social Worker

## 2022-11-02 DIAGNOSIS — F4310 Post-traumatic stress disorder, unspecified: Secondary | ICD-10-CM

## 2022-11-03 ENCOUNTER — Ambulatory Visit (INDEPENDENT_AMBULATORY_CARE_PROVIDER_SITE_OTHER): Payer: Medicare HMO | Admitting: Family Medicine

## 2022-11-03 VITALS — BP 144/98 | HR 67 | Ht 62.0 in | Wt 155.6 lb

## 2022-11-03 DIAGNOSIS — D509 Iron deficiency anemia, unspecified: Secondary | ICD-10-CM | POA: Diagnosis not present

## 2022-11-03 DIAGNOSIS — D508 Other iron deficiency anemias: Secondary | ICD-10-CM

## 2022-11-03 DIAGNOSIS — I10 Essential (primary) hypertension: Secondary | ICD-10-CM

## 2022-11-03 DIAGNOSIS — M62838 Other muscle spasm: Secondary | ICD-10-CM

## 2022-11-03 DIAGNOSIS — Z1231 Encounter for screening mammogram for malignant neoplasm of breast: Secondary | ICD-10-CM

## 2022-11-03 LAB — FERRITIN: Ferritin: 8.6 ng/mL — ABNORMAL LOW (ref 10.0–291.0)

## 2022-11-03 LAB — CBC
HCT: 36.3 % (ref 36.0–46.0)
Hemoglobin: 11.3 g/dL — ABNORMAL LOW (ref 12.0–15.0)
MCHC: 31.1 g/dL (ref 30.0–36.0)
MCV: 83.7 fl (ref 78.0–100.0)
Platelets: 378 10*3/uL (ref 150.0–400.0)
RBC: 4.34 Mil/uL (ref 3.87–5.11)
RDW: 13.7 % (ref 11.5–15.5)
WBC: 7.1 10*3/uL (ref 4.0–10.5)

## 2022-11-03 MED ORDER — CARVEDILOL 6.25 MG PO TABS
6.2500 mg | ORAL_TABLET | Freq: Two times a day (BID) | ORAL | 3 refills | Status: DC
Start: 2022-11-03 — End: 2022-11-15

## 2022-11-03 NOTE — Progress Notes (Signed)
SUBJECTIVE:   Chief Complaint  Patient presents with   Hypertension    Patient concerned of weight gain from possible medication, amlodipine. Patient stated that she started getting involuntary muscle movements in left leg and left forearm, and right quad, spasms started 2-3 weeks ago, spasms occurred after stopping te amlodipine    HPI Presents to clinic for follow up HTN and weight gain  Reports had increased weight gain while taking Amlodipine.  Also noted to have muscle spasm in arms and legs.  She stopped medication early May.  Was seen in clinic by NP,  metabolic panel reassuring at that time.  She was normotensive during visit.    Today she reports that the swelling has slightly subsided but still there. Her weight is still elevated despite no change in her eating habits.  She has not been able to increase activity due to chronic back and knee issues.    Endorses that her BP at home is about 138/88.  Denies any headaches, chest pain, shortness of breath or heart flutters.  She is compliant with Metoprolol XL 25 mg daily.  PERTINENT PMH / PSH: HTN Congenital absence of Right Kidney  IDA Gastric Roux-en Y gastric bypass Chronic Back pain Chronic Knee pain  OBJECTIVE:  BP (!) 144/98 (BP Location: Left Arm, Patient Position: Sitting, Cuff Size: Normal)   Pulse 67   Ht 5\' 2"  (1.575 m)   Wt 155 lb 9.6 oz (70.6 kg)   SpO2 98%   BMI 28.46 kg/m    Physical Exam Vitals reviewed.  Constitutional:      General: She is not in acute distress.    Appearance: Normal appearance. She is obese. She is not ill-appearing, toxic-appearing or diaphoretic.  HENT:     Right Ear: Tympanic membrane, ear canal and external ear normal.     Left Ear: Tympanic membrane, ear canal and external ear normal.     Mouth/Throat:     Mouth: Mucous membranes are moist.  Eyes:     General:        Right eye: No discharge.        Left eye: No discharge.     Conjunctiva/sclera: Conjunctivae normal.   Neck:     Thyroid: No thyromegaly or thyroid tenderness.  Cardiovascular:     Rate and Rhythm: Normal rate and regular rhythm.     Heart sounds: Normal heart sounds.  Pulmonary:     Effort: Pulmonary effort is normal.     Breath sounds: Normal breath sounds. No wheezing.  Abdominal:     General: Bowel sounds are normal.  Musculoskeletal:        General: Normal range of motion.     Right lower leg: Edema present.     Left lower leg: Edema present.  Skin:    General: Skin is warm and dry.  Neurological:     General: No focal deficit present.     Mental Status: She is alert and oriented to person, place, and time. Mental status is at baseline.  Psychiatric:        Mood and Affect: Mood normal.        Behavior: Behavior normal.        Thought Content: Thought content normal.        Judgment: Judgment normal.     ASSESSMENT/PLAN:  Primary hypertension Assessment & Plan: Chronic.  Improved with amlodipine but unable to tolerate due to side effect bilateral lower extremity edema and weight gain.  BP  elevated today and again on repeat.   Start Carvedilol 6.25 mg BID Discontinue metoprolol XL 25 mg daily Discontinue amlodipine 5 mg daily Monitor blood pressure at home.  Goal less than 130/80. Follow up in 1 month or sooner if symptomatic       Orders: -     Carvedilol; Take 1 tablet (6.25 mg total) by mouth 2 (two) times daily with a meal.  Dispense: 60 tablet; Refill: 3  Muscle spasm Assessment & Plan: Continues to have muscle spasms intermittently Check CBC and Ferritin  Orders: -     CBC -     Ferritin  Other iron deficiency anemia Assessment & Plan: Continues to have lower extremity edema bilaterally despite discontinuation of Amlodipine Low suspicion for DVT given bilateral edema,  Wells score 0 Check CBC and Ferritin  Orders: -     CBC -     Ferritin  Breast cancer screening by mammogram -     3D Screening Mammogram, Left and Right; Future  HCM PAP  due.  Patient to schedule appointment Mammogram up to date.  Due 04/2023.  Referral placed Colonoscopy due 03/18/2024.  Repeat 5 years per GI. Tetanus up to date.  Due 01/2027 Declined Shingles Hepatitis C/HIV screening completed Dexa scan at age 33y  PDMP reviewed  Return in about 1 month (around 12/03/2022) for PCP.  Dana Allan, MD

## 2022-11-03 NOTE — Patient Instructions (Addendum)
It was a pleasure meeting you today. Thank you for allowing me to take part in your health care.  Our goals for today as we discussed include:  Stop Metoprolol Stop Amlodipine  Start Carvedilol 6.25 mg two times a day  Will check iron today  Schedule PAP at your earliest convenience  If you have any questions or concerns, please do not hesitate to call the office at [phone].  I look forward to our next visit and until then take care and stay safe.  Regards,   Dana Allan, MD   [facility]

## 2022-11-04 DIAGNOSIS — M546 Pain in thoracic spine: Secondary | ICD-10-CM | POA: Diagnosis not present

## 2022-11-05 ENCOUNTER — Other Ambulatory Visit: Payer: Self-pay | Admitting: Family Medicine

## 2022-11-05 ENCOUNTER — Encounter: Payer: Self-pay | Admitting: Family Medicine

## 2022-11-05 ENCOUNTER — Encounter: Payer: Self-pay | Admitting: *Deleted

## 2022-11-05 DIAGNOSIS — E611 Iron deficiency: Secondary | ICD-10-CM

## 2022-11-05 MED ORDER — IRON (FERROUS SULFATE) 325 (65 FE) MG PO TABS
325.0000 mg | ORAL_TABLET | ORAL | 3 refills | Status: DC
Start: 2022-11-05 — End: 2022-11-05

## 2022-11-09 DIAGNOSIS — M546 Pain in thoracic spine: Secondary | ICD-10-CM | POA: Diagnosis not present

## 2022-11-12 DIAGNOSIS — M546 Pain in thoracic spine: Secondary | ICD-10-CM | POA: Diagnosis not present

## 2022-11-13 ENCOUNTER — Other Ambulatory Visit: Payer: Self-pay | Admitting: Family Medicine

## 2022-11-13 ENCOUNTER — Encounter: Payer: Self-pay | Admitting: Family Medicine

## 2022-11-13 DIAGNOSIS — D508 Other iron deficiency anemias: Secondary | ICD-10-CM

## 2022-11-13 DIAGNOSIS — Z9884 Bariatric surgery status: Secondary | ICD-10-CM

## 2022-11-13 NOTE — Assessment & Plan Note (Addendum)
Chronic.  Improved with amlodipine but unable to tolerate due to side effect bilateral lower extremity edema and weight gain.  BP elevated today and again on repeat.   Start Carvedilol 6.25 mg BID Discontinue metoprolol XL 25 mg daily Discontinue amlodipine 5 mg daily Monitor blood pressure at home.  Goal less than 130/80. Follow up in 1 month or sooner if symptomatic

## 2022-11-13 NOTE — Assessment & Plan Note (Signed)
Continues to have lower extremity edema bilaterally despite discontinuation of Amlodipine Low suspicion for DVT given bilateral edema,  Wells score 0 Check CBC and Ferritin

## 2022-11-13 NOTE — Assessment & Plan Note (Signed)
Continues to have muscle spasms intermittently Check CBC and Ferritin

## 2022-11-15 ENCOUNTER — Other Ambulatory Visit: Payer: Self-pay | Admitting: Family Medicine

## 2022-11-15 ENCOUNTER — Encounter: Payer: Self-pay | Admitting: Family Medicine

## 2022-11-15 DIAGNOSIS — M546 Pain in thoracic spine: Secondary | ICD-10-CM | POA: Diagnosis not present

## 2022-11-15 MED ORDER — METOPROLOL SUCCINATE ER 25 MG PO TB24: 25.0000 mg | ORAL_TABLET | Freq: Every day | ORAL | 3 refills | Status: DC

## 2022-11-23 ENCOUNTER — Ambulatory Visit (INDEPENDENT_AMBULATORY_CARE_PROVIDER_SITE_OTHER): Payer: Medicare HMO | Admitting: Licensed Clinical Social Worker

## 2022-11-23 DIAGNOSIS — Z91199 Patient's noncompliance with other medical treatment and regimen due to unspecified reason: Secondary | ICD-10-CM

## 2022-11-23 NOTE — Progress Notes (Signed)
LCSW called pt at 11:05AM to inquire about pt attendance at today's appt and left vm. LCSW called a second time at 11:10AM to inquire about pt attendance without a pt answer. Pt marked as no show.

## 2022-11-29 ENCOUNTER — Telehealth: Payer: Self-pay

## 2022-11-29 ENCOUNTER — Other Ambulatory Visit: Payer: Self-pay | Admitting: Orthopedic Surgery

## 2022-11-29 DIAGNOSIS — M4325 Fusion of spine, thoracolumbar region: Secondary | ICD-10-CM

## 2022-11-29 DIAGNOSIS — Z981 Arthrodesis status: Secondary | ICD-10-CM

## 2022-11-29 DIAGNOSIS — M7918 Myalgia, other site: Secondary | ICD-10-CM

## 2022-11-29 MED ORDER — CYCLOBENZAPRINE HCL 10 MG PO TABS
10.0000 mg | ORAL_TABLET | Freq: Three times a day (TID) | ORAL | 0 refills | Status: AC | PRN
Start: 2022-11-29 — End: ?

## 2022-11-29 NOTE — Telephone Encounter (Signed)
Left a message for pt to call back regarding: received a faxed refill request for pt's Metoprolol 25mg  from [facility].  Refill for same medication was sent to [facility] on 11/15/22.  Need to know which pharmacy the pt wishes to use.

## 2022-11-29 NOTE — Telephone Encounter (Signed)
From: Willey Blade To: Office of Smiley Houseman, [place] Sent: 11/28/2022 11:32 AM EDT Subject: Medication Renewal Request  Refills have been requested for the following medications:   cyclobenzaprine (FLEXERIL) 10 MG tablet Drake Leach M]  Preferred pharmacy: [facility] [id number] - [place], [place] - [address] Delivery method: [employer]

## 2022-11-29 NOTE — Telephone Encounter (Signed)
Can do one more refill of flexeril, but please let her know she will need to get further refills from her PCP since we are not regularly seeing her.

## 2022-11-29 NOTE — Telephone Encounter (Signed)
Patient notified of refill via voice mail and mychart.

## 2022-12-03 NOTE — Telephone Encounter (Signed)
LMTCB

## 2022-12-06 NOTE — Telephone Encounter (Signed)
Called pt to see if this was handled pt stated she already spoke to someone in regards to this on last week.

## 2022-12-08 DIAGNOSIS — M546 Pain in thoracic spine: Secondary | ICD-10-CM | POA: Diagnosis not present

## 2022-12-09 ENCOUNTER — Ambulatory Visit: Payer: Medicare HMO | Admitting: Family Medicine

## 2022-12-14 ENCOUNTER — Ambulatory Visit (INDEPENDENT_AMBULATORY_CARE_PROVIDER_SITE_OTHER): Payer: Medicare HMO | Admitting: Licensed Clinical Social Worker

## 2022-12-14 DIAGNOSIS — F4310 Post-traumatic stress disorder, unspecified: Secondary | ICD-10-CM

## 2022-12-27 ENCOUNTER — Encounter: Payer: Self-pay | Admitting: *Deleted

## 2022-12-27 MED FILL — Iron Sucrose Inj 20 MG/ML (Fe Equiv): INTRAVENOUS | Qty: 10 | Status: AC

## 2022-12-28 ENCOUNTER — Encounter: Payer: Self-pay | Admitting: Oncology

## 2022-12-28 ENCOUNTER — Ambulatory Visit (INDEPENDENT_AMBULATORY_CARE_PROVIDER_SITE_OTHER): Payer: Medicare HMO | Admitting: Licensed Clinical Social Worker

## 2022-12-28 ENCOUNTER — Other Ambulatory Visit: Payer: Self-pay

## 2022-12-28 ENCOUNTER — Emergency Department
Admission: EM | Admit: 2022-12-28 | Discharge: 2022-12-28 | Disposition: A | Discharge status: Home / Self Care | Payer: Medicare HMO | Attending: Emergency Medicine | Admitting: Emergency Medicine

## 2022-12-28 ENCOUNTER — Inpatient Hospital Stay: Payer: Medicare HMO

## 2022-12-28 ENCOUNTER — Encounter: Payer: Self-pay | Admitting: Emergency Medicine

## 2022-12-28 ENCOUNTER — Inpatient Hospital Stay: Payer: Medicare HMO | Attending: Oncology | Admitting: Oncology

## 2022-12-28 VITALS — BP 147/75 | HR 64 | Resp 20

## 2022-12-28 VITALS — BP 111/88 | HR 80 | Temp 96.2°F | Resp 18 | Wt 156.6 lb

## 2022-12-28 DIAGNOSIS — T782XXA Anaphylactic shock, unspecified, initial encounter: Secondary | ICD-10-CM

## 2022-12-28 DIAGNOSIS — R0602 Shortness of breath: Secondary | ICD-10-CM | POA: Insufficient documentation

## 2022-12-28 DIAGNOSIS — I10 Essential (primary) hypertension: Secondary | ICD-10-CM | POA: Diagnosis not present

## 2022-12-28 DIAGNOSIS — T454X5A Adverse effect of iron and its compounds, initial encounter: Secondary | ICD-10-CM | POA: Diagnosis not present

## 2022-12-28 DIAGNOSIS — D509 Iron deficiency anemia, unspecified: Secondary | ICD-10-CM | POA: Diagnosis not present

## 2022-12-28 DIAGNOSIS — T7840XA Allergy, unspecified, initial encounter: Secondary | ICD-10-CM | POA: Diagnosis not present

## 2022-12-28 DIAGNOSIS — Z9884 Bariatric surgery status: Secondary | ICD-10-CM

## 2022-12-28 DIAGNOSIS — F4310 Post-traumatic stress disorder, unspecified: Secondary | ICD-10-CM | POA: Diagnosis not present

## 2022-12-28 DIAGNOSIS — T8090XA Unspecified complication following infusion and therapeutic injection, initial encounter: Secondary | ICD-10-CM | POA: Insufficient documentation

## 2022-12-28 MED ORDER — METHYLPREDNISOLONE SODIUM SUCC 125 MG IJ SOLR
125.0000 mg | Freq: Once | INTRAMUSCULAR | Status: AC | PRN
  Administered 2022-12-28: 125 mg via INTRAVENOUS

## 2022-12-28 MED ORDER — SODIUM CHLORIDE 0.9 % IV BOLUS
1000.0000 mL | Freq: Once | INTRAVENOUS | Status: AC
  Administered 2022-12-28: 1000 mL via INTRAVENOUS

## 2022-12-28 MED ORDER — FAMOTIDINE IN NACL 20-0.9 MG/50ML-% IV SOLN
20.0000 mg | Freq: Once | INTRAVENOUS | Status: AC
  Administered 2022-12-28: 20 mg via INTRAVENOUS
  Filled 2022-12-28: qty 50

## 2022-12-28 MED ORDER — SODIUM CHLORIDE 0.9 % IV SOLN
INTRAVENOUS | Status: DC
  Filled 2022-12-28: qty 250

## 2022-12-28 MED ORDER — RACEPINEPHRINE HCL 2.25 % IN NEBU
0.5000 mL | INHALATION_SOLUTION | Freq: Once | RESPIRATORY_TRACT | Status: AC
  Administered 2022-12-28: 0.5 mL via RESPIRATORY_TRACT
  Filled 2022-12-28: qty 0.5

## 2022-12-28 MED ORDER — SODIUM CHLORIDE 0.9 % IV SOLN
Freq: Once | INTRAVENOUS | Status: DC | PRN
  Administered 2022-12-28: 250 mL via INTRAVENOUS
  Filled 2022-12-28: qty 250

## 2022-12-28 MED ORDER — DIPHENHYDRAMINE HCL 50 MG/ML IJ SOLN
50.0000 mg | Freq: Once | INTRAMUSCULAR | Status: AC | PRN
  Administered 2022-12-28: 25 mg via INTRAVENOUS

## 2022-12-28 MED ORDER — SODIUM CHLORIDE 0.9 % IV SOLN
200.0000 mg | Freq: Once | INTRAVENOUS | Status: AC
  Administered 2022-12-28: 200 mg via INTRAVENOUS
  Filled 2022-12-28: qty 200

## 2022-12-28 MED ORDER — PREDNISONE 20 MG PO TABS: 40.0000 mg | ORAL_TABLET | Freq: Every day | ORAL | 0 refills | Status: AC

## 2022-12-28 MED ORDER — SODIUM CHLORIDE 0.9 % IV SOLN
200.0000 mg | Freq: Once | INTRAVENOUS | Status: DC
  Filled 2022-12-28: qty 10

## 2022-12-28 MED ORDER — LORAZEPAM 2 MG/ML IJ SOLN
1.0000 mg | Freq: Once | INTRAMUSCULAR | Status: AC
  Administered 2022-12-28: 1 mg via INTRAVENOUS
  Filled 2022-12-28: qty 1

## 2022-12-28 NOTE — Progress Notes (Signed)
Pt here for follow up. Reports increased fatigue and shortness of breath. Denies chest pain.

## 2022-12-28 NOTE — Assessment & Plan Note (Signed)
Today after Venofer infusion, she experienced funny sensation of her throat, vital sign remains stable.  She was evaluated by NP, was given IVF, solumedrol 125mg  and benadryl and steroid. Later she developed "wield sensation of tongue'. Sent to ER for further evaluation.

## 2022-12-28 NOTE — Progress Notes (Signed)
1552: patient stated her throat felt funny/sore. 1553: Message Cathie Hoops, MD through secure chat and checked VS BP 147/85, HR 68, 100%  1600- NP Allen, at chairside, 250 NS bolus ordered. 1601- BP 143/82, 100% O2, HR 67 After Bolus, VS, and patient is stable, okay to discharge patient home.  1615: patient coughing and throat feeling tighter.  1622: 148/94, HR 67, 100%, Allen, NP at bedside.  1625: Another 250 Bolus with 25mg  of Benadryl IV.  1635: 125mg  of Solu-medrol IV and another 250 Bolus.  1646: patient stated "tongue feels weird."  1648: Sending patient to the ER. Explained to the patient and she understands.

## 2022-12-28 NOTE — ED Provider Notes (Signed)
[facility] Provider Note    Event Date/Time   First MD Initiated Contact with Patient 12/28/22 1704     (approximate)  History   Chief Complaint: Allergic Reaction  HPI  Shelby Colon is a 57 y.o. female with a past medical history of anxiety, depression, hypertension, hyperlipidemia, presents to the emergency department for possible allergic reaction.  According to the patient she was at the cancer center getting an iron infusion when she began feeling short of breath with a sensation of throat tightness and coughing.  Patient was given 125 mg of Solu-Medrol and 25 mg of IV Benadryl at the cancer center prior to arrival.  Patient denies any rash or itching denies any shortness of breath states she has a sensation of swelling in the throat and has had a cough since this started.  Patient states she has had iron infusions multiple times in the past without any issue the last of which however was about 1.5 years ago.  Patient admits she became very nervous and anxious after the symptoms started.  Physical Exam   Triage Vital Signs: ED Triage Vitals  Encounter Vitals Group     BP 12/28/22 1708 (!) 172/105     Systolic BP Percentile --      Diastolic BP Percentile --      Pulse Rate 12/28/22 1708 73     Resp 12/28/22 1708 (!) 22     Temp 12/28/22 1708 97.8 F (36.6 C)     Temp Source 12/28/22 1708 Oral     SpO2 12/28/22 1708 97 %     Weight 12/28/22 1707 156 lb 8.4 oz (71 kg)     Height 12/28/22 1707 5\' 2"  (1.575 m)     Head Circumference --      Peak Flow --      Pain Score 12/28/22 1707 8     Pain Loc --      Pain Education --      Exclude from Growth Chart --     Most recent vital signs: Vitals:   12/28/22 1708 12/28/22 1715  BP: (!) 172/105 (!) 167/86  Pulse: 73 63  Resp: (!) 22 14  Temp: 97.8 F (36.6 C)   SpO2: 97% 100%    General: Awake, no distress.  CV:  Good peripheral perfusion.  Regular rate and rhythm  Resp:  Normal effort.   Equal breath sounds bilaterally.  Abd:  No distention.  Soft, nontender.  No rebound or guarding. Other:  Widely patent oropharynx, no tongue edema no pharyngeal edema identified.  Patient does have an occasional dry cough which she states just started after the infusion.   ED Results / Procedures / Treatments   MEDICATIONS ORDERED IN ED: Medications  famotidine (PEPCID) IVPB 20 mg premix (20 mg Intravenous New Bag/Given 12/28/22 1718)  sodium chloride 0.9 % bolus 1,000 mL (1,000 mLs Intravenous New Bag/Given 12/28/22 1723)  Racepinephrine HCl 2.25 % nebulizer solution 0.5 mL (0.5 mLs Nebulization Given 12/28/22 1720)  LORazepam (ATIVAN) injection 1 mg (1 mg Intravenous Given 12/28/22 1716)     IMPRESSION / MDM / ASSESSMENT AND PLAN / ED COURSE  I reviewed the triage vital signs and the nursing notes.  Patient's presentation is most consistent with acute presentation with potential threat to life or bodily function.  Patient presents to the emergency department for possible allergic reaction to an iron infusion.  Overall patient appears well she is quite anxious appearing but there is no  oral edema no pharyngeal edema noted.  Clear lung sounds without wheeze or difficulty breathing.  No rash or hives identified on exam.  We will dose 20 mg of IV Pepcid, IV fluids.  Given the patient's dry cough and sensation of swelling we will also dose of racemic epinephrine nebulizer.  We will dose a small dose of Ativan for anxiety.  Will continue to closely monitor the patient in the emergency department.  Patient states she is feeling much better.  States she is ready to go home.  I have evaluated the patient once again continues to have clear lung sounds no wheeze rales or rhonchi.  Continues to have a widely patent oropharynx with no swelling identified on my examination.  As the patient feels well we will discharge home with prednisone for the next 5 days.  Discussed my typical anaphylaxis/allergic  reaction return precautions.  Patient agreeable to plan of care.  FINAL CLINICAL IMPRESSION(S) / ED DIAGNOSES   Allergic reaction   Note:  This document was prepared using Dragon voice recognition software and may include unintentional dictation errors.   Minna Antis, MD 12/28/22 Ebony Cargo

## 2022-12-28 NOTE — Assessment & Plan Note (Signed)
At risk of Vitamin deficiency.  I will check B12, folate, at next visit.

## 2022-12-28 NOTE — ED Triage Notes (Signed)
Pt arrives after allergic reaction during iron infusion. Pt began to have throat tightness and cough. Reports a iron infusion a year and half ago with no reaction.  Pt give 25 mg benadryl and 125 solu-medrol before arrival.

## 2022-12-28 NOTE — Discharge Instructions (Addendum)
As we discussed you may take Benadryl 50 mg as needed for any itching or hives.  Starting tomorrow 7/31 please begin taking your prednisone each morning as prescribed for the next 5 days.  Return to the emergency department immediately if you have any discomfort or swelling in the throat any trouble breathing, or any other symptom concerning to yourself.

## 2022-12-28 NOTE — Assessment & Plan Note (Signed)
Labs are reviewed and discussed with patient.  Lab Results  Component Value Date   HGB 11.3 (L) 11/03/2022   TIBC 255 01/05/2021   IRONPCTSAT 32 (H) 01/05/2021   FERRITIN 8.6 (L) 11/03/2022    Recommend IV Venofer weekly x 4.

## 2022-12-28 NOTE — Progress Notes (Signed)
Hematology/Oncology Progress note [phone] [phone]      Patient Care Team: Dana Allan, MD as PCP - General (Family Medicine) Debbe Odea, MD as PCP - Cardiology (Cardiology) Rickard Patience, MD as Consulting Physician (Oncology)  REFERRING PROVIDER: Dana Allan, MD  CHIEF COMPLAINTS/REASON FOR VISIT:  Follow up for iron deficiency anemia.   HISTORY OF PRESENTING ILLNESS:  Shelby Colon is a 57 y.o. female who was seen in consultation at the request of Dana Allan, MD for evaluation of thromcbocytosis Reviewed patient's labs.  02/15/2019 Labs showed elevated platelet counts at 453, Hb 11.3,  wbc 7.3.   Reviewed patient's previous labs. Thrombocytosis onset is chronic onset , duration since 2019.  No aggravating or elevated factors. Associated symptoms or signs:  Denies weight loss, fever, chills, fatigue, night sweats.  Context:  Smoking history: never smoker Family history of polycythemia. denies History of iron deficiency anemia. Yes.  History of DVT, denies History of gastric bypass. Yes.  Had a colonoscopy 03/19/2019.   INTERVAL HISTORY Shelby Colon is a 57 y.o. female who has above history reviewed by me today presents for follow up visit for management of iron deficiency anemia She previously tolerated IV venofer treatments.  Was seen by min 2022. Then she moved to [place] and recently moved back  History of Gastric bypass.  Denies hematochezia, hematuria, hematemesis, epistaxis, black tarry stool or easy bruising.     Review of Systems  Constitutional:  Positive for fatigue. Negative for appetite change, chills and fever.  HENT:   Negative for hearing loss and voice change.   Eyes:  Negative for eye problems.  Respiratory:  Negative for chest tightness and cough.   Cardiovascular:  Negative for chest pain.  Gastrointestinal:  Negative for abdominal distention, abdominal pain and blood in stool.  Endocrine: Negative for hot  flashes.  Genitourinary:  Negative for difficulty urinating and frequency.   Musculoskeletal:  Negative for arthralgias.  Skin:  Negative for itching and rash.  Neurological:  Negative for extremity weakness.  Hematological:  Negative for adenopathy.  Psychiatric/Behavioral:  Negative for confusion.     MEDICAL HISTORY:  Past Medical History:  Diagnosis Date   Abnormal MRI, cervical spine 10/17/2019   FINDINGS: Alignment: Reversal of the normal cervical lordosis with apex at C3-4. Trace retrolisthesis of C4 on C5, C5 on C6, and C6 on C7, with anterolisthesis of C7 on T1. Findings chronic and facet mediated.   Vertebrae: Vertebral body height maintained without evidence for acute or chronic fracture. Bone marrow signal intensity within normal limits. No discrete or worrisome osseous lesions. Pro   Abnormal Pap smear of cervix    h/o LSIL pap with HPV +; 01/2019 pap negative negative HPV   Alcohol use 02/09/2022   Altered mental status 12/08/2021   Altered mental status 12/08/2021   Annual physical exam 06/12/2020   Anxiety    Anxiety 06/12/2020   Anxiety and depression 11/30/2017   Atrial fibrillation (HCC)    Atrophy of right kidney 03/05/2019   B12 deficiency    Benign neoplasm of ascending colon    Brain cyst 12/10/2021   Cardiac murmur 02/14/2018   Chronic knee pain (Bilateral) 09/07/2018   09/21/2018 left total knee ortho [place] [facility]    Chronic pain syndrome    Colon polyps    Confusion 12/10/2021   Congenital absence of right kidney    Degeneration of lumbar intervertebral disc    Depression    Early menopause  Elevated alkaline phosphatase level 02/23/2019   Elevated BP without diagnosis of hypertension 08/04/2021   Last Assessment & Plan: Formatting of this note might be different from the original. BP at last visit was 131/85 and today is 156/88.  Advised patient to come back within the next 2 to 4 weeks to recheck her blood pressure.   Goal BP is less than 140/90.   Gallstones    Gastric bypass status for obesity    2013, weight 258lb   Headache    Heart murmur    MVP   History of chicken pox    HPV in female 11/30/2017   Hypercholesterolemia    Hypertension    Iron deficiency    Iron deficiency anemia 03/02/2019   Neuromuscular disorder (HCC)    Neuropathy    Personal history of colonic polyps    Pharmacologic therapy 10/17/2019   Polyp of sigmoid colon    Problems influencing health status 10/17/2019   Rheumatoid arthritis (HCC)    as child    Scoliosis of lumbar spine    SIADH (syndrome of inappropriate ADH production) (HCC) 01/01/2022   Solitary kidney, congenital    pt thinks has left kidney only no right kidney   Thrombocytosis 02/23/2019   Thyroid disease    in the past    Transaminitis 12/08/2021   UTI (urinary tract infection)    Vitamin D deficiency     SURGICAL HISTORY: Past Surgical History:  Procedure Laterality Date   ANTERIOR CERVICAL DECOMPRESSION/DISCECTOMY FUSION 4 LEVELS N/A 11/28/2019   Procedure: ANTERIOR CERVICAL DECOMPRESSION/DISCECTOMY FUSION 4 LEVELS C3-7;  Surgeon: Venetia Night, MD;  Location: [facility] ORS;  Service: Neurosurgery;  Laterality: N/A;   AUGMENTATION MAMMAPLASTY Bilateral 2009   BACK SURGERY  11/04/2014   fusion of spine of lumbosacral region 2 rods and 16 screws    BREAST ENHANCEMENT SURGERY     COLONOSCOPY WITH PROPOFOL N/A 01/06/2018   Procedure: COLONOSCOPY WITH PROPOFOL;  Surgeon: Pasty Spillers, MD;  Location: [facility] ENDOSCOPY;  Service: Endoscopy;  Laterality: N/A;   COLONOSCOPY WITH PROPOFOL N/A 03/19/2019   Procedure: COLONOSCOPY WITH PROPOFOL;  Surgeon: Pasty Spillers, MD;  Location: [facility] ENDOSCOPY;  Service: Endoscopy;  Laterality: N/A;   COSMETIC SURGERY     excess skin removal     JOINT REPLACEMENT  09/20/2008   both knees done right was last year and left just done this   KNEE ARTHROSCOPY Bilateral    x 7 knees b/l (2 surgeries on  right knee) total 1 bone graft and 5 arthroscopy total knee    ROUX-EN-Y PROCEDURE     SPINE SURGERY  11/07/2014   2 rods and 16 screws   TOTAL KNEE ARTHROPLASTY     left 09/21/18 Dr. Dema Severin II Franco Collet   TOTAL KNEE ARTHROPLASTY     right knee Dr. Elliot Gurney     SOCIAL HISTORY: Social History   Socioeconomic History   Marital status: Married    Spouse name: alex   Number of children: 0   Years of education: 16   Highest education level: Bachelor's degree (e.g., BA, AB, BS)  Occupational History   Not on file  Tobacco Use   Smoking status: Never   Smokeless tobacco: Never   Tobacco comments:    Never smoked  Vaping Use   Vaping status: Never Used  Substance and Sexual Activity   Alcohol use: Not Currently    Comment: SOCIALLY. BUT JUST STOP.   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    BA degree    No guns, wears seat belt, safe in relationship    Social Determinants of Health   Financial Resource Strain: Medium Risk (11/01/2022)   Overall Financial Resource Strain (CARDIA)    Difficulty of Paying Living Expenses: Somewhat hard  Food Insecurity: No Food Insecurity (11/01/2022)   Hunger Vital Sign    Worried About Running Out of Food in the Last Year: Never true    Ran Out of Food in the Last Year: Never true  Transportation Needs: No Transportation Needs (11/01/2022)   PRAPARE - Administrator, Civil Service (Medical): No    Lack of Transportation (Non-Medical): No  Physical Activity: Unknown (11/01/2022)   Exercise Vital Sign    Days of Exercise per Week: Patient declined    Minutes of Exercise per Session: 30 min  Recent Concern: Physical Activity -  Insufficiently Active (08/18/2022)   Exercise Vital Sign    Days of Exercise per Week: 1 day    Minutes of Exercise per Session: 30 min  Stress: Stress Concern Present (11/01/2022)   [employer] of Occupational Health - Occupational Stress Questionnaire    Feeling of Stress : To some extent  Social Connections: Moderately Isolated (11/01/2022)   Social Connection and Isolation Panel [NHANES]    Frequency of Communication with Friends and Family: More than three times a week    Frequency of Social Gatherings with Friends and Family: Twice a week    Attends Religious Services: Never    Database administrator or Organizations: No    Attends Banker Meetings: Never    Marital Status: Married  Catering manager Violence: Not At Risk (08/18/2022)   Humiliation, Afraid, Rape, and Kick questionnaire    Fear of Current or Ex-Partner: No    Emotionally Abused: No    Physically Abused: No    Sexually Abused: No    FAMILY HISTORY: Family History  Adopted: Yes  Problem Relation Age of Onset   Arthritis Mother    Drug abuse Mother        heriod OD   Early death Mother    Diabetes Father    Hypertension Father    Depression Father    Arthritis Father    Drug abuse Father    Diabetes Sister    Fibromyalgia Sister    Rashes / Skin problems Sister        PSORIASIS   Diabetes Brother    Osteoarthritis Maternal Grandmother    Aneurysm Maternal Aunt    Thyroid disease Maternal Aunt    Fibromyalgia Maternal Aunt    Breast cancer Maternal Aunt    Cancer Maternal Aunt        breast cancer     ALLERGIES:  is allergic to morphine.  MEDICATIONS:  Current Outpatient Medications  Medication Sig Dispense Refill   cyclobenzaprine (FLEXERIL) 10 MG tablet Take 1 tablet (10 mg total) by mouth 3 (three) times daily as needed for muscle spasms. This can make you sleepy. 60 tablet 0   DULoxetine (CYMBALTA) 60 MG capsule Take 60 mg by mouth daily.  fluticasone (FLONASE) 50 MCG/ACT  nasal spray Place 2 sprays into both nostrils daily. 16 g 6   metoprolol succinate (TOPROL XL) 25 MG 24 hr tablet Take 1 tablet (25 mg total) by mouth daily. 30 tablet 3   pregabalin (LYRICA) 50 MG capsule TAKE ONE CAPSULE BY MOUTH TWICE A DAY AND TAKE ONE CAPSULE BY MOUTH AT BEDTIME 90 capsule 0   VITAMIN D-VITAMIN K PO Take by mouth.     predniSONE (DELTASONE) 20 MG tablet Take 2 tablets (40 mg total) by mouth daily with breakfast for 5 days. 10 tablet 0   No current facility-administered medications for this visit.     PHYSICAL EXAMINATION: ECOG PERFORMANCE STATUS: 1 - Symptomatic but completely ambulatory Vitals:   12/28/22 1434  BP: 111/88  Pulse: 80  Resp: 18  Temp: (!) 96.2 F (35.7 C)   Filed Weights   12/28/22 1434  Weight: 156 lb 9.6 oz (71 kg)    Physical Exam Constitutional:      General: She is not in acute distress. HENT:     Head: Normocephalic and atraumatic.  Eyes:     General: No scleral icterus.    Pupils: Pupils are equal, round, and reactive to light.  Cardiovascular:     Rate and Rhythm: Normal rate and regular rhythm.     Heart sounds: Normal heart sounds.  Pulmonary:     Effort: Pulmonary effort is normal.  Abdominal:     General: Bowel sounds are normal. There is no distension.     Palpations: Abdomen is soft. There is no mass.     Tenderness: There is no abdominal tenderness.  Musculoskeletal:        General: No deformity. Normal range of motion.     Cervical back: Normal range of motion and neck supple.  Skin:    General: Skin is warm and dry.     Findings: No erythema or rash.  Neurological:     Mental Status: She is alert and oriented to person, place, and time.     Cranial Nerves: No cranial nerve deficit.     Coordination: Coordination normal.  Psychiatric:        Mood and Affect: Mood normal.      LABORATORY DATA:  I have reviewed the data as listed Lab Results  Component Value Date   WBC 7.1 11/03/2022   HGB 11.3 (L)  11/03/2022   HCT 36.3 11/03/2022   MCV 83.7 11/03/2022   PLT 378.0 11/03/2022   Recent Labs    02/09/22 1828 02/10/22 0142 02/10/22 0448 02/16/22 1638 06/29/22 1544 10/22/22 1330  NA 123*   < > 129* 140 141 141  K 3.7  --  3.5 3.7 4.2 4.1  CL 84*  --  96* 104 99 104  CO2 20*  --  25 27 33* 30  GLUCOSE 112*  --  105* 74 69* 86  BUN 10  --  11 16 19 20   CREATININE 0.67  --  0.73 0.77 0.89 0.94  CALCIUM 9.2  --  9.0 9.1 8.9 9.3  GFRNONAA >60  --  >60  --   --   --   PROT 7.6  --   --  6.6 6.5 6.8  ALBUMIN 4.4  --   --  4.1 4.1 4.1  AST 41  --   --  22 38* 32  ALT 28  --   --  16 36* 22  ALKPHOS 103  --   --  85 100 95  BILITOT 1.2  --   --  0.3 0.3 0.4   < > = values in this interval not displayed.   Iron/TIBC/Ferritin/ %Sat    Component Value Date/Time   IRON 82 01/05/2021 1107   IRON 35 02/15/2019 1004   TIBC 255 01/05/2021 1107   TIBC 286 02/15/2019 1004   FERRITIN 8.6 (L) 11/03/2022 1108   FERRITIN 38 02/15/2019 1004   IRONPCTSAT 32 (H) 01/05/2021 1107   IRONPCTSAT 12 (L) 02/15/2019 1004   IRONPCTSAT 31 12/05/2017 1044      RADIOGRAPHIC STUDIES: I have personally reviewed the radiological images as listed and agreed with the findings in the report. No results found.

## 2022-12-29 ENCOUNTER — Ambulatory Visit: Payer: Medicare HMO | Admitting: Family Medicine

## 2022-12-29 NOTE — Progress Notes (Signed)
Dr. Cathie Hoops team followed up with the patient today. The patient has decided to continue the IV venofer with the steroids prior to infusion. Noted Venofer as a intolerance because of recent event.

## 2022-12-30 ENCOUNTER — Encounter: Payer: Self-pay | Admitting: Family Medicine

## 2022-12-30 ENCOUNTER — Ambulatory Visit (INDEPENDENT_AMBULATORY_CARE_PROVIDER_SITE_OTHER): Payer: Medicare HMO | Admitting: Family Medicine

## 2022-12-30 VITALS — BP 124/76 | HR 74 | Temp 97.8°F | Resp 16 | Ht 62.0 in | Wt 157.0 lb

## 2022-12-30 DIAGNOSIS — N3000 Acute cystitis without hematuria: Secondary | ICD-10-CM | POA: Diagnosis not present

## 2022-12-30 DIAGNOSIS — Z9884 Bariatric surgery status: Secondary | ICD-10-CM | POA: Diagnosis not present

## 2022-12-30 DIAGNOSIS — R8290 Unspecified abnormal findings in urine: Secondary | ICD-10-CM | POA: Diagnosis not present

## 2022-12-30 DIAGNOSIS — I10 Essential (primary) hypertension: Secondary | ICD-10-CM

## 2022-12-30 DIAGNOSIS — E559 Vitamin D deficiency, unspecified: Secondary | ICD-10-CM

## 2022-12-30 LAB — POCT URINALYSIS DIPSTICK (MANUAL)
Nitrite, UA: NEGATIVE
Poct Bilirubin: NEGATIVE
Poct Blood: NEGATIVE
Poct Glucose: NORMAL mg/dL
Poct Ketones: NEGATIVE
Poct Protein: NEGATIVE mg/dL
Poct Urobilinogen: NORMAL mg/dL
Spec Grav, UA: 1.02 (ref 1.010–1.025)
pH, UA: 6 (ref 5.0–8.0)

## 2022-12-30 LAB — VITAMIN B12: Vitamin B-12: 341 pg/mL (ref 211–911)

## 2022-12-30 LAB — VITAMIN D 25 HYDROXY (VIT D DEFICIENCY, FRACTURES): VITD: 41.16 ng/mL (ref 30.00–100.00)

## 2022-12-30 NOTE — Progress Notes (Signed)
SUBJECTIVE:   Chief Complaint  Patient presents with   Hypertension   HPI Follow up HTN  Hypertension Asymptomatic.  BP has been good.  Compliant with current medication.  Did not tolerate switch from Metoprolol to Carvedilol.    Cloudy urine No urinary symptoms Wants urine checked  PERTINENT PMH / PSH: Chronic Pain HTN H/o Gastric Bypass  OBJECTIVE:  BP 124/76   Pulse 74   Temp 97.8 F (36.6 C)   Resp 16   Ht 5\' 2"  (1.575 m)   Wt 157 lb (71.2 kg)   SpO2 99%   BMI 28.72 kg/m    Physical Exam Vitals reviewed.  Constitutional:      General: She is not in acute distress.    Appearance: Normal appearance. She is normal weight. She is not ill-appearing, toxic-appearing or diaphoretic.  Eyes:     General:        Right eye: No discharge.        Left eye: No discharge.     Conjunctiva/sclera: Conjunctivae normal.  Cardiovascular:     Rate and Rhythm: Normal rate and regular rhythm.     Heart sounds: Normal heart sounds.  Pulmonary:     Effort: Pulmonary effort is normal.     Breath sounds: Normal breath sounds.  Abdominal:     General: Bowel sounds are normal. There is no distension.     Palpations: Abdomen is soft.     Tenderness: There is no right CVA tenderness or left CVA tenderness.  Musculoskeletal:        General: Normal range of motion.  Skin:    General: Skin is warm and dry.  Neurological:     General: No focal deficit present.     Mental Status: She is alert and oriented to person, place, and time. Mental status is at baseline.  Psychiatric:        Mood and Affect: Mood normal.        Behavior: Behavior normal.        Thought Content: Thought content normal.        Judgment: Judgment normal.        01/11/2023    2:14 PM 01/04/2023    4:58 PM 12/30/2022   10:49 AM 11/03/2022   10:29 AM 11/02/2022    1:01 PM  Depression screen PHQ 2/9  Decreased Interest   3 3   Down, Depressed, Hopeless   2 2   PHQ - 2 Score   5 5   Altered sleeping   3 2    Tired, decreased energy   2 3   Change in appetite   3 3   Feeling bad or failure about yourself    2 2   Trouble concentrating   0 1   Moving slowly or fidgety/restless   0 0   Suicidal thoughts   0 0   PHQ-9 Score   15 16   Difficult doing work/chores   Somewhat difficult Very difficult      Information is confidential and restricted. Go to Review Flowsheets to unlock data.      01/11/2023    2:15 PM 01/04/2023    4:59 PM 12/30/2022   10:50 AM 11/03/2022   10:30 AM  GAD 7 : Generalized Anxiety Score  Nervous, Anxious, on Edge   0 2  Control/stop worrying   3 3  Worry too much - different things   3 2  Trouble relaxing   0 1  Restless   0   Easily annoyed or irritable   1 3  Afraid - awful might happen   1 1  Total GAD 7 Score   8   Anxiety Difficulty   Somewhat difficult Somewhat difficult     Information is confidential and restricted. Go to Review Flowsheets to unlock data.    ASSESSMENT/PLAN:  Primary hypertension Assessment & Plan: Chronic.  Well controlled on current medication.  Did not tolerate carvedilol Continue metoprolol XL 25 mg daily Monitor blood pressure at home.          Vitamin D deficiency Assessment & Plan: Check Vitamin D level  Orders: -     VITAMIN D 25 Hydroxy (Vit-D Deficiency, Fractures)  History of Roux-en-Y gastric bypass -     Vitamin B12  H/O gastric bypass Assessment & Plan: Recommend Fusion plus for chronic anemia   Acute cystitis without hematuria Assessment & Plan: Urine positive for leukocytes, negative for nitrates Culture positive for E.Coli >100000 cfu Prescription sent for Keflex 500 mg QID Informed to start probiotics daily and continue for 14 days after treatment Follow up if no improvement  Orders: -     POCT Urinalysis Dip Manual -     Urine Culture   PDMP reviewed  Return if symptoms worsen or fail to improve, for PCP.  Dana Allan, MD

## 2022-12-30 NOTE — Patient Instructions (Addendum)
It was a pleasure meeting you today. Thank you for allowing me to take part in your health care.  Our goals for today as we discussed include:  Continue current blood pressure medication  We will get some labs today.  If they are abnormal or we need to do something about them, I will call you.  If they are normal, I will send you a message on MyChart (if it is active) or a letter in the mail.  If you don't hear from [place] in 2 weeks, please call the office at the number below.   PAP due.  Schedule appointment at your earliest convenience.   If you have any questions or concerns, please do not hesitate to call the office at [phone].  I look forward to our next visit and until then take care and stay safe.  Regards,   Dana Allan, MD   [facility]

## 2023-01-01 ENCOUNTER — Encounter: Payer: Self-pay | Admitting: Family Medicine

## 2023-01-01 ENCOUNTER — Other Ambulatory Visit: Payer: Self-pay | Admitting: Family Medicine

## 2023-01-01 DIAGNOSIS — N3000 Acute cystitis without hematuria: Secondary | ICD-10-CM

## 2023-01-01 MED ORDER — CEPHALEXIN 500 MG PO CAPS
500.0000 mg | ORAL_CAPSULE | Freq: Four times a day (QID) | ORAL | 0 refills | Status: AC
Start: 2023-01-01 — End: 2023-01-06

## 2023-01-01 MED ORDER — SACCHAROMYCES BOULARDII 250 MG PO CAPS: 250.0000 mg | ORAL_CAPSULE | Freq: Every day | ORAL | 0 refills | Status: DC

## 2023-01-03 ENCOUNTER — Telehealth: Payer: Self-pay

## 2023-01-03 ENCOUNTER — Other Ambulatory Visit: Payer: Self-pay | Admitting: Oncology

## 2023-01-03 DIAGNOSIS — G5793 Unspecified mononeuropathy of bilateral lower limbs: Secondary | ICD-10-CM | POA: Diagnosis not present

## 2023-01-03 DIAGNOSIS — G930 Cerebral cysts: Secondary | ICD-10-CM | POA: Diagnosis not present

## 2023-01-03 DIAGNOSIS — E559 Vitamin D deficiency, unspecified: Secondary | ICD-10-CM | POA: Diagnosis not present

## 2023-01-03 DIAGNOSIS — R2689 Other abnormalities of gait and mobility: Secondary | ICD-10-CM | POA: Diagnosis not present

## 2023-01-03 MED ORDER — PREDNISONE 50 MG PO TABS: 50.0000 mg | ORAL_TABLET | ORAL | 0 refills | Status: DC

## 2023-01-03 MED FILL — Iron Sucrose Inj 20 MG/ML (Fe Equiv): INTRAVENOUS | Qty: 10 | Status: AC

## 2023-01-03 NOTE — Telephone Encounter (Signed)
Called pt to inform her that since she is scheduled for iron infusion tomorrow(8/6),  Dr. Cathie Hoops has sent in Prednisone 50mg  for her to take tonight and tomorrow- 1 hour prior to infusion. Pt verbalized understanding.

## 2023-01-03 NOTE — Telephone Encounter (Signed)
Spoke to pt on the day after infusion reaction to follow up and to see if she wanted to cancel future iron infusions and take oral iron. She stated that since she had bypass surgery her body did not absorb oral iron well so she wanted to proceed with another iron infusion. Informed her that since she wants to proceed with iron infuison, Dr. Cathie Hoops will order premeds prior to infusion. Pt verbalized understanding.

## 2023-01-04 ENCOUNTER — Inpatient Hospital Stay: Payer: Medicare HMO | Attending: Oncology

## 2023-01-04 ENCOUNTER — Inpatient Hospital Stay (HOSPITAL_BASED_OUTPATIENT_CLINIC_OR_DEPARTMENT_OTHER): Payer: Medicare HMO | Admitting: Nurse Practitioner

## 2023-01-04 VITALS — BP 142/79 | HR 73 | Temp 96.2°F | Resp 16

## 2023-01-04 DIAGNOSIS — T8090XA Unspecified complication following infusion and therapeutic injection, initial encounter: Secondary | ICD-10-CM | POA: Diagnosis not present

## 2023-01-04 DIAGNOSIS — D509 Iron deficiency anemia, unspecified: Secondary | ICD-10-CM | POA: Diagnosis not present

## 2023-01-04 DIAGNOSIS — F419 Anxiety disorder, unspecified: Secondary | ICD-10-CM | POA: Diagnosis not present

## 2023-01-04 DIAGNOSIS — R609 Edema, unspecified: Secondary | ICD-10-CM | POA: Insufficient documentation

## 2023-01-04 DIAGNOSIS — R070 Pain in throat: Secondary | ICD-10-CM | POA: Insufficient documentation

## 2023-01-04 DIAGNOSIS — Z79899 Other long term (current) drug therapy: Secondary | ICD-10-CM

## 2023-01-04 DIAGNOSIS — R059 Cough, unspecified: Secondary | ICD-10-CM | POA: Diagnosis not present

## 2023-01-04 DIAGNOSIS — T50905A Adverse effect of unspecified drugs, medicaments and biological substances, initial encounter: Secondary | ICD-10-CM

## 2023-01-04 MED ORDER — LORAZEPAM 2 MG/ML IJ SOLN
0.5000 mg | Freq: Once | INTRAMUSCULAR | Status: AC
  Administered 2023-01-04: 0.5 mg via INTRAVENOUS
  Filled 2023-01-04: qty 1

## 2023-01-04 MED ORDER — DIPHENHYDRAMINE HCL 25 MG PO CAPS
50.0000 mg | ORAL_CAPSULE | Freq: Once | ORAL | Status: AC
  Administered 2023-01-04: 50 mg via ORAL

## 2023-01-04 MED ORDER — SODIUM CHLORIDE 0.9 % IV SOLN
INTRAVENOUS | Status: DC
  Filled 2023-01-04: qty 250

## 2023-01-04 MED ORDER — SODIUM CHLORIDE 0.9 % IV SOLN
Freq: Once | INTRAVENOUS | Status: DC | PRN
  Filled 2023-01-04: qty 250

## 2023-01-04 MED ORDER — FAMOTIDINE IN NACL 20-0.9 MG/50ML-% IV SOLN
20.0000 mg | Freq: Once | INTRAVENOUS | Status: AC | PRN
  Administered 2023-01-04: 20 mg via INTRAVENOUS

## 2023-01-04 MED ORDER — SODIUM CHLORIDE 0.9 % IV SOLN
200.0000 mg | Freq: Once | INTRAVENOUS | Status: AC
  Administered 2023-01-04: 200 mg via INTRAVENOUS
  Filled 2023-01-04: qty 290.91

## 2023-01-04 NOTE — Progress Notes (Signed)
Hypersensitivity Reaction note  Date of event: 01/04/23 Time of event: 1435 Generic name of drug involved: iron sucrose Name of provider notified of the hypersensitivity reaction: Consuello Masse, NP Was agent that likely caused hypersensitivity reaction added to Allergies List within EMR? Yes Chain of events including reaction signs/symptoms, treatment administered, and outcome (e.g., drug resumed; drug discontinued; sent to Emergency Department; etc.)   Patient took 50 mg of prednisone yesterday and 50 mg today, one hour prior to her visit here, per Dr. Bethanne Ginger instructions (due to her last visit here and intolerance to iron sucrose). Benadryl 50 mg given PO prior to iron sucrose infusion, as ordered by Dr. Cathie Hoops.  1422 Iron sucrose started 1435 Patient started complaining of throat feeling tight, Iron sucrose stopped and NS started at 999 ml/hr. 1436 Consuello Masse NP at chairside. 1440 Pepcid 20 mg given IV. 1455 Symptoms improved but not completely gone. 1510 Ativan 0.5 mg given IV per Consuello Masse, NP. 1520 Symptoms gone. 1526 Iron sucrose restarted at half rate (220 ml/hr, diluted with NS at 220 ml/hr, per Consuello Masse NP. [id number] Iron sucrose completed without further incident. Patient very sleepy at this time.   1629 Patient discharged in stable, ambulatory condition.  See flowsheets for vital signs.  Adora Fridge, RN 01/04/2023 3:22 PM

## 2023-01-04 NOTE — Progress Notes (Addendum)
[facility]  [facility] A Department of the [place]. [facility] [address] [place], [place] [address] [phone] (phone) [phone] (fax)  Patient Care Team: Dana Allan, MD as PCP - General (Family Medicine) Debbe Odea, MD as PCP - Cardiology (Cardiology) Rickard Patience, MD as Consulting Physician (Oncology)   Name of the patient: Shelby Colon  [id number]  Jun 20, 1965   Date of visit: 01/04/23  Diagnosis- Iron deficiency anemia  Chief complaint/ Reason for visit- Infusion reaction  Heme/Onc history:  Oncology History   No history exists.    Interval history- Called to infusion for complaints of possible infusion reaction. Patient receiving venofer infusion. I have met patient previously after another possible infusion reaction in which she was transferred to ER. Note from 12/28/22 was reviewed and in ER patient received racemic epinephrine nebulizer, pepcid IV, and ativan 1 mg IV. She was discharged home same day.  Per patient, she began feeling throat tightness and squeezing sensation, sensation of tongue swelling half way through her venofer infusion. Medication was stopped by nursing. Fluids started. Due to prior reaction she has taken prednisone 50 mg yesterday and 1 hour prior to her infusion. She received benadryl IV as pre-med.   Review of systems- Review of Systems  HENT:  Positive for sore throat. Negative for congestion and sinus pain.   Respiratory:  Positive for cough and shortness of breath. Negative for wheezing and stridor.   Cardiovascular:  Negative for chest pain and palpitations.  Gastrointestinal:  Negative for abdominal pain, diarrhea, nausea and vomiting.  Genitourinary:  Negative for urgency.  Skin:  Negative for itching and rash.  Neurological:  Positive for speech change. Negative for dizziness, sensory change, weakness and headaches.  Psychiatric/Behavioral:  The  patient is nervous/anxious.       Allergies  Allergen Reactions   Morphine Nausea And Vomiting   Venofer [Iron Sucrose] Other (See Comments)    12/28/22- Throat tightness and coughing. Patient sent to ED. Steroid pre-meds will be given prior to future infusions. See progress note from 12/28/2022.    Past Medical History:  Diagnosis Date   Abnormal MRI, cervical spine 10/17/2019   FINDINGS: Alignment: Reversal of the normal cervical lordosis with apex at C3-4. Trace retrolisthesis of C4 on C5, C5 on C6, and C6 on C7, with anterolisthesis of C7 on T1. Findings chronic and facet mediated.   Vertebrae: Vertebral body height maintained without evidence for acute or chronic fracture. Bone marrow signal intensity within normal limits. No discrete or worrisome osseous lesions. Pro   Abnormal Pap smear of cervix    h/o LSIL pap with HPV +; 01/2019 pap negative negative HPV   Alcohol use 02/09/2022   Altered mental status 12/08/2021   Altered mental status 12/08/2021   Annual physical exam 06/12/2020   Anxiety    Anxiety 06/12/2020   Anxiety and depression 11/30/2017   Atrial fibrillation (HCC)    Atrophy of right kidney 03/05/2019   B12 deficiency    Benign neoplasm of ascending colon    Brain cyst 12/10/2021   Cardiac murmur 02/14/2018   Chronic knee pain (Bilateral) 09/07/2018   09/21/2018 left total knee ortho [place] Dr. [facility] OP    Chronic pain syndrome    Colon polyps    Confusion 12/10/2021   Congenital absence of right kidney    Degeneration of lumbar intervertebral disc    Depression    Early menopause  Elevated alkaline phosphatase level 02/23/2019   Elevated BP without diagnosis of hypertension 08/04/2021   Last Assessment & Plan: Formatting of this note might be different from the original. BP at last visit was 131/85 and today is 156/88.  Advised patient to come back within the next 2 to 4 weeks to recheck her blood pressure.  Goal BP is less than  140/90.   Gallstones    Gastric bypass status for obesity    2013, weight 258lb   Headache    Heart murmur    MVP   History of chicken pox    HPV in female 11/30/2017   Hypercholesterolemia    Hypertension    Iron deficiency    Iron deficiency anemia 03/02/2019   Neuromuscular disorder (HCC)    Neuropathy    Personal history of colonic polyps    Pharmacologic therapy 10/17/2019   Polyp of sigmoid colon    Problems influencing health status 10/17/2019   Rheumatoid arthritis (HCC)    as child    Scoliosis of lumbar spine    SIADH (syndrome of inappropriate ADH production) (HCC) 01/01/2022   Solitary kidney, congenital    pt thinks has left kidney only no right kidney   Thrombocytosis 02/23/2019   Thyroid disease    in the past    Transaminitis 12/08/2021   UTI (urinary tract infection)    Vitamin D deficiency     Past Surgical History:  Procedure Laterality Date   ANTERIOR CERVICAL DECOMPRESSION/DISCECTOMY FUSION 4 LEVELS N/A 11/28/2019   Procedure: ANTERIOR CERVICAL DECOMPRESSION/DISCECTOMY FUSION 4 LEVELS C3-7;  Surgeon: Venetia Night, MD;  Location: [facility] ORS;  Service: Neurosurgery;  Laterality: N/A;   AUGMENTATION MAMMAPLASTY Bilateral 2009   BACK SURGERY  11/04/2014   fusion of spine of lumbosacral region 2 rods and 16 screws    BREAST ENHANCEMENT SURGERY     COLONOSCOPY WITH PROPOFOL N/A 01/06/2018   Procedure: COLONOSCOPY WITH PROPOFOL;  Surgeon: Pasty Spillers, MD;  Location: [facility] ENDOSCOPY;  Service: Endoscopy;  Laterality: N/A;   COLONOSCOPY WITH PROPOFOL N/A 03/19/2019   Procedure: COLONOSCOPY WITH PROPOFOL;  Surgeon: Pasty Spillers, MD;  Location: [facility] ENDOSCOPY;  Service: Endoscopy;  Laterality: N/A;   COSMETIC SURGERY     excess skin removal     JOINT REPLACEMENT  09/20/2008   both knees done right was last year and left just done this   KNEE ARTHROSCOPY Bilateral    x 7 knees b/l (2 surgeries on right knee) total 1 bone graft and 5  arthroscopy total knee    ROUX-EN-Y PROCEDURE     SPINE SURGERY  11/07/2014   2 rods and 16 screws   TOTAL KNEE ARTHROPLASTY     left 09/21/18 Dr. Dema Severin II Franco Collet   TOTAL KNEE ARTHROPLASTY     right knee Dr. Elliot Gurney     Social History   Socioeconomic History   Marital status: Married    Spouse name: alex   Number of children: 0   Years of education: 16   Highest education level: Bachelor's degree (e.g., BA, AB, BS)  Occupational History   Not on file  Tobacco Use   Smoking status: Never   Smokeless tobacco: Never   Tobacco comments:    Never smoked  Vaping Use   Vaping status: Never Used  Substance and Sexual Activity   Alcohol use: Not Currently    Comment: SOCIALLY. BUT JUST STOP.   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    BA degree    No guns, wears seat belt, safe in relationship    Social Determinants of Health   Financial Resource Strain: Medium Risk (11/01/2022)   Overall Financial Resource Strain (CARDIA)    Difficulty of Paying Living Expenses: Somewhat hard  Food Insecurity: No Food Insecurity (11/01/2022)   Hunger Vital Sign    Worried About Running Out of Food in the Last Year: Never true    Ran Out of Food in the Last Year: Never true  Transportation Needs: No Transportation Needs (11/01/2022)   PRAPARE - Administrator, Civil Service (Medical): No    Lack of Transportation (Non-Medical): No  Physical Activity: Unknown (11/01/2022)   Exercise Vital Sign    Days of Exercise per Week: Patient declined    Minutes of Exercise per Session: 30 min  Recent Concern: Physical Activity - Insufficiently Active (08/18/2022)   Exercise Vital Sign     Days of Exercise per Week: 1 day    Minutes of Exercise per Session: 30 min  Stress: Stress Concern Present (11/01/2022)   [employer] of Occupational Health - Occupational Stress Questionnaire    Feeling of Stress : To some extent  Social Connections: Moderately Isolated (11/01/2022)   Social Connection and Isolation Panel [NHANES]    Frequency of Communication with Friends and Family: More than three times a week    Frequency of Social Gatherings with Friends and Family: Twice a week    Attends Religious Services: Never    Database administrator or Organizations: No    Attends Banker Meetings: Never    Marital Status: Married  Catering manager Violence: Not At Risk (08/18/2022)   Humiliation, Afraid, Rape, and Kick questionnaire    Fear of Current or Ex-Partner: No    Emotionally Abused: No    Physically Abused: No    Sexually Abused: No    Family History  Adopted: Yes  Problem Relation Age of Onset   Arthritis Mother    Drug abuse Mother        heriod OD   Early death Mother    Diabetes Father    Hypertension Father    Depression Father    Arthritis Father    Drug abuse Father    Diabetes Sister    Fibromyalgia Sister    Rashes / Skin problems Sister        PSORIASIS   Diabetes Brother    Osteoarthritis Maternal Grandmother    Aneurysm Maternal Aunt    Thyroid disease Maternal Aunt    Fibromyalgia Maternal Aunt    Breast cancer Maternal Aunt    Cancer Maternal Aunt        breast cancer      Current Outpatient Medications:    cephALEXin (KEFLEX) 500 MG capsule, Take 1 capsule (500 mg total) by mouth 4 (four) times daily for 5 days., Disp: 20 capsule, Rfl: 0   cyclobenzaprine (FLEXERIL) 10 MG tablet, Take 1 tablet (10 mg total) by mouth 3 (three) times daily as needed for muscle spasms. This can make you sleepy., Disp: 60 tablet, Rfl: 0   DULoxetine (CYMBALTA) 60 MG capsule,  Take 60 mg by mouth daily., Disp: , Rfl:    fluticasone (FLONASE) 50  MCG/ACT nasal spray, Place 2 sprays into both nostrils daily., Disp: 16 g, Rfl: 6   metoprolol succinate (TOPROL XL) 25 MG 24 hr tablet, Take 1 tablet (25 mg total) by mouth daily., Disp: 30 tablet, Rfl: 3   predniSONE (DELTASONE) 50 MG tablet, Take 1 tablet (50 mg total) by mouth See admin instructions. Prednisone - take 50 mg on the day prior to Venofer, 1 hour before Venofer., Disp: 30 tablet, Rfl: 0   pregabalin (LYRICA) 50 MG capsule, TAKE ONE CAPSULE BY MOUTH TWICE A DAY AND TAKE ONE CAPSULE BY MOUTH AT BEDTIME, Disp: 90 capsule, Rfl: 0   saccharomyces boulardii (FLORASTOR) 250 MG capsule, Take 1 capsule (250 mg total) by mouth daily., Disp: 90 capsule, Rfl: 0   VITAMIN D-VITAMIN K PO, Take by mouth., Disp: , Rfl:  No current facility-administered medications for this visit.  Facility-Administered Medications Ordered in Other Visits:    0.9 %  sodium chloride infusion, , Intravenous, Continuous, Rickard Patience, MD, Last Rate: 10 mL/hr at 01/04/23 1356, New Bag at 01/04/23 1356   0.9 %  sodium chloride infusion, , Intravenous, Once PRN, Rickard Patience, MD, Last Rate: 220 mL/hr at 01/04/23 1526, Rate Change at 01/04/23 1526  Physical exam: There were no vitals filed for this visit. Physical Exam Vitals reviewed.  Constitutional:      Appearance: She is not ill-appearing.  HENT:     Mouth/Throat:     Mouth: Mucous membranes are moist.     Pharynx: No posterior oropharyngeal erythema.     Comments: No edema Cardiovascular:     Rate and Rhythm: Normal rate and regular rhythm.  Pulmonary:     Effort: No respiratory distress.     Breath sounds: No wheezing.  Musculoskeletal:        General: No swelling or deformity.  Skin:    Findings: No erythema or rash.  Neurological:     Mental Status: She is alert and oriented to person, place, and time.  Psychiatric:        Attention and Perception: Attention normal.        Mood and Affect: Mood is anxious.        Speech: Speech normal.        Behavior:  Behavior is cooperative.     Assessment and plan- Patient is a 57 y.o. female   Infusion reaction/Side effect of medication- repeat intolerance to venofer with cough, throat tightness. Similar to previous episode in which she was treated in the emergency room. Symptoms appeared halfway through her infusion today which is not consistent with allergic reaction and more consistent with medication side effect or intolerance. She was treated with pepcid 20 mg IV and IV fluids. Symptoms did not worsen. After shared decision making including discussing clinical presentation with Dr Cathie Hoops, we elected to rechallenge at slowed rate (50% of ordered rate) and concurrent IV fluids. Patient also anxious appearing and was given ativan 0.5 mg IV. She was able to tolerate remainder of her infusion well with an improvement in her symptoms. She has driver for discharge today d/t ativan.  Iron Deficiency anemia- plan is to continue venofer. Will continue pre-medication with prednisone day prior and day of infusion. She will also receive benadryl as pre-med. I've also changed infusion rate to reflect 50% reduction in rate and she will receive concurrent or Y-in fluids at same rate.  Anxiety- I've added ativan  0.5 mg IV to patient's treatment plan.   She will return to clinic for venofer as scheduled.    Visit Diagnosis 1. Infusion reaction, initial encounter   2. Medication side effect, initial encounter   3. High risk medication use   4. Iron deficiency anemia, unspecified iron deficiency anemia type    Patient expressed understanding and was in agreement with this plan. She also understands that She can call clinic at any time with any questions, concerns, or complaints.   Thank you for allowing me to participate in the care of this very pleasant patient.   Consuello Masse, DNP, AGNP-C, AOCNP [facility] [phone]

## 2023-01-04 NOTE — Addendum Note (Signed)
Addended by: Alinda Dooms on: 01/04/2023 04:40 PM   Modules accepted: Orders

## 2023-01-06 ENCOUNTER — Encounter: Payer: Self-pay | Admitting: Oncology

## 2023-01-06 NOTE — Addendum Note (Signed)
Addended by: Amaryllis Dyke on: 01/06/2023 09:13 AM   Modules accepted: Orders

## 2023-01-11 ENCOUNTER — Ambulatory Visit (INDEPENDENT_AMBULATORY_CARE_PROVIDER_SITE_OTHER): Payer: Medicare HMO | Admitting: Licensed Clinical Social Worker

## 2023-01-11 DIAGNOSIS — F4310 Post-traumatic stress disorder, unspecified: Secondary | ICD-10-CM

## 2023-01-12 ENCOUNTER — Inpatient Hospital Stay: Payer: Medicare HMO

## 2023-01-12 VITALS — BP 135/74 | HR 81 | Resp 16

## 2023-01-12 DIAGNOSIS — R059 Cough, unspecified: Secondary | ICD-10-CM | POA: Diagnosis not present

## 2023-01-12 DIAGNOSIS — D509 Iron deficiency anemia, unspecified: Secondary | ICD-10-CM

## 2023-01-12 DIAGNOSIS — Z79899 Other long term (current) drug therapy: Secondary | ICD-10-CM | POA: Diagnosis not present

## 2023-01-12 DIAGNOSIS — F419 Anxiety disorder, unspecified: Secondary | ICD-10-CM | POA: Diagnosis not present

## 2023-01-12 DIAGNOSIS — R609 Edema, unspecified: Secondary | ICD-10-CM | POA: Diagnosis not present

## 2023-01-12 DIAGNOSIS — R070 Pain in throat: Secondary | ICD-10-CM | POA: Diagnosis not present

## 2023-01-12 MED ORDER — SODIUM CHLORIDE 0.9 % IV SOLN
Freq: Once | INTRAVENOUS | Status: AC
  Filled 2023-01-12: qty 250

## 2023-01-12 MED ORDER — LORAZEPAM 2 MG/ML IJ SOLN
0.5000 mg | Freq: Once | INTRAMUSCULAR | Status: AC
  Administered 2023-01-12: 0.5 mg via INTRAVENOUS
  Filled 2023-01-12: qty 1

## 2023-01-12 MED ORDER — DIPHENHYDRAMINE HCL 25 MG PO CAPS
50.0000 mg | ORAL_CAPSULE | Freq: Once | ORAL | Status: AC
  Administered 2023-01-12: 50 mg via ORAL
  Filled 2023-01-12: qty 2

## 2023-01-12 MED ORDER — SODIUM CHLORIDE 0.9 % IV SOLN
200.0000 mg | Freq: Once | INTRAVENOUS | Status: AC
  Administered 2023-01-12: 200 mg via INTRAVENOUS
  Filled 2023-01-12: qty 290.91

## 2023-01-12 NOTE — Patient Instructions (Signed)
 Iron Sucrose Injection What is this medication? IRON SUCROSE (EYE ern SOO krose) treats low levels of iron (iron deficiency anemia) in people with kidney disease. Iron is a mineral that plays an important role in making red blood cells, which carry oxygen from your lungs to the rest of your body. This medicine may be used for other purposes; ask your health care provider or pharmacist if you have questions. COMMON BRAND NAME(S): Venofer What should I tell my care team before I take this medication? They need to know if you have any of these conditions: Anemia not caused by low iron levels Heart disease High levels of iron in the blood Kidney disease Liver disease An unusual or allergic reaction to iron, other medications, foods, dyes, or preservatives Pregnant or trying to get pregnant Breastfeeding How should I use this medication? This medication is for infusion into a vein. It is given in a hospital or clinic setting. Talk to your care team about the use of this medication in children. While this medication may be prescribed for children as young as 2 years for selected conditions, precautions do apply. Overdosage: If you think you have taken too much of this medicine contact a poison control center or emergency room at once. NOTE: This medicine is only for you. Do not share this medicine with others. What if I miss a dose? Keep appointments for follow-up doses. It is important not to miss your dose. Call your care team if you are unable to keep an appointment. What may interact with this medication? Do not take this medication with any of the following: Deferoxamine Dimercaprol Other iron products This medication may also interact with the following: Chloramphenicol Deferasirox This list may not describe all possible interactions. Give your health care provider a list of all the medicines, herbs, non-prescription drugs, or dietary supplements you use. Also tell them if you smoke,  drink alcohol, or use illegal drugs. Some items may interact with your medicine. What should I watch for while using this medication? Visit your care team regularly. Tell your care team if your symptoms do not start to get better or if they get worse. You may need blood work done while you are taking this medication. You may need to follow a special diet. Talk to your care team. Foods that contain iron include: whole grains/cereals, dried fruits, beans, or peas, leafy green vegetables, and organ meats (liver, kidney). What side effects may I notice from receiving this medication? Side effects that you should report to your care team as soon as possible: Allergic reactions--skin rash, itching, hives, swelling of the face, lips, tongue, or throat Low blood pressure--dizziness, feeling faint or lightheaded, blurry vision Shortness of breath Side effects that usually do not require medical attention (report to your care team if they continue or are bothersome): Flushing Headache Joint pain Muscle pain Nausea Pain, redness, or irritation at injection site This list may not describe all possible side effects. Call your doctor for medical advice about side effects. You may report side effects to FDA at 1-800-FDA-1088. Where should I keep my medication? This medication is given in a hospital or clinic. It will not be stored at home. NOTE: This sheet is a summary. It may not cover all possible information. If you have questions about this medicine, talk to your doctor, pharmacist, or health care provider.  2024 Elsevier/Gold Standard (2022-10-22 00:00:00)

## 2023-01-12 NOTE — Progress Notes (Signed)
Patient tolerated Venofer infusion well, despite previous reactions. Benadryl PO & Ativan IV given. Slowed infusion rate at 135ml/hr. Concurrent fluids at rate of 220 ml/hr. No questions/concerns or symptoms voiced. Monitored 30 min post transfusion. Patient stable at discharge. AVS given.

## 2023-01-12 NOTE — Progress Notes (Signed)
Patient states she took her steroid 1 hour prior to coming to clinic

## 2023-01-15 ENCOUNTER — Encounter: Payer: Self-pay | Admitting: Family Medicine

## 2023-01-15 NOTE — Assessment & Plan Note (Signed)
Chronic.  Well controlled on current medication.  Did not tolerate carvedilol Continue metoprolol XL 25 mg daily Monitor blood pressure at home.

## 2023-01-15 NOTE — Assessment & Plan Note (Signed)
Urine positive for leukocytes, negative for nitrates Culture positive for E.Coli >100000 cfu Prescription sent for Keflex 500 mg QID Informed to start probiotics daily and continue for 14 days after treatment Follow up if no improvement

## 2023-01-15 NOTE — Assessment & Plan Note (Signed)
Recommend Fusion plus for chronic anemia

## 2023-01-15 NOTE — Assessment & Plan Note (Signed)
Check Vitamin D level 

## 2023-01-19 ENCOUNTER — Inpatient Hospital Stay: Payer: Medicare HMO

## 2023-01-20 ENCOUNTER — Inpatient Hospital Stay: Payer: Medicare HMO

## 2023-01-20 VITALS — BP 144/89 | HR 94 | Resp 16

## 2023-01-20 DIAGNOSIS — R070 Pain in throat: Secondary | ICD-10-CM | POA: Diagnosis not present

## 2023-01-20 DIAGNOSIS — R609 Edema, unspecified: Secondary | ICD-10-CM | POA: Diagnosis not present

## 2023-01-20 DIAGNOSIS — D509 Iron deficiency anemia, unspecified: Secondary | ICD-10-CM | POA: Diagnosis not present

## 2023-01-20 DIAGNOSIS — F419 Anxiety disorder, unspecified: Secondary | ICD-10-CM | POA: Diagnosis not present

## 2023-01-20 DIAGNOSIS — R059 Cough, unspecified: Secondary | ICD-10-CM | POA: Diagnosis not present

## 2023-01-20 DIAGNOSIS — Z79899 Other long term (current) drug therapy: Secondary | ICD-10-CM | POA: Diagnosis not present

## 2023-01-20 MED ORDER — LORAZEPAM 2 MG/ML IJ SOLN
0.5000 mg | Freq: Once | INTRAMUSCULAR | Status: AC
  Administered 2023-01-20: 0.5 mg via INTRAVENOUS
  Filled 2023-01-20: qty 1

## 2023-01-20 MED ORDER — DIPHENHYDRAMINE HCL 25 MG PO CAPS
50.0000 mg | ORAL_CAPSULE | Freq: Once | ORAL | Status: AC
  Administered 2023-01-20: 50 mg via ORAL
  Filled 2023-01-20: qty 2

## 2023-01-20 MED ORDER — SODIUM CHLORIDE 0.9 % IV SOLN
200.0000 mg | Freq: Once | INTRAVENOUS | Status: AC
  Administered 2023-01-20: 200 mg via INTRAVENOUS
  Filled 2023-01-20: qty 10

## 2023-01-20 MED ORDER — SODIUM CHLORIDE 0.9 % IV SOLN
Freq: Once | INTRAVENOUS | Status: AC
  Filled 2023-01-20: qty 250

## 2023-01-25 ENCOUNTER — Ambulatory Visit (INDEPENDENT_AMBULATORY_CARE_PROVIDER_SITE_OTHER): Payer: Medicare HMO | Admitting: Licensed Clinical Social Worker

## 2023-01-25 DIAGNOSIS — F4310 Post-traumatic stress disorder, unspecified: Secondary | ICD-10-CM

## 2023-01-28 DIAGNOSIS — Z1231 Encounter for screening mammogram for malignant neoplasm of breast: Secondary | ICD-10-CM

## 2023-02-01 ENCOUNTER — Other Ambulatory Visit: Payer: Self-pay

## 2023-02-01 ENCOUNTER — Encounter: Payer: Self-pay | Admitting: Physician Assistant

## 2023-02-01 ENCOUNTER — Ambulatory Visit: Payer: Medicare HMO | Admitting: Physician Assistant

## 2023-02-01 ENCOUNTER — Ambulatory Visit: Payer: Medicare HMO | Admitting: Obstetrics

## 2023-02-01 VITALS — BP 123/80 | HR 80 | Temp 98.4°F | Ht 62.0 in | Wt 164.0 lb

## 2023-02-01 DIAGNOSIS — Z86010 Personal history of colonic polyps: Secondary | ICD-10-CM

## 2023-02-01 DIAGNOSIS — R1031 Right lower quadrant pain: Secondary | ICD-10-CM

## 2023-02-01 DIAGNOSIS — D509 Iron deficiency anemia, unspecified: Secondary | ICD-10-CM

## 2023-02-01 DIAGNOSIS — R1032 Left lower quadrant pain: Secondary | ICD-10-CM

## 2023-02-01 DIAGNOSIS — R1084 Generalized abdominal pain: Secondary | ICD-10-CM

## 2023-02-01 MED ORDER — NA SULFATE-K SULFATE-MG SULF 17.5-3.13-1.6 GM/177ML PO SOLN
354.0000 mL | Freq: Once | ORAL | 0 refills | Status: AC
Start: 2023-02-01 — End: 2023-02-01

## 2023-02-01 NOTE — Patient Instructions (Signed)
Got your CT scan schedule for you on 02/08/2023 arrive to the medical mall at [facility] at 1:00pm for a 3:00pm scan. Only liquids 4 hours before the scan. If you need to reschedule please call [phone] option 3 and then option 2.  The CPT codes for procedures are 69629 [id number]. IDC 10 diagnosis codes are D50.9.

## 2023-02-01 NOTE — Progress Notes (Signed)
Celso Amy, PA-C [address]  [address]  [place], [place] [address]  Main: [phone]  Fax: [phone]   Gastroenterology Consultation  Referring Provider:     Dana Allan, MD Primary Care Physician:  Dana Allan, MD Primary Gastroenterologist:  Celso Amy, PA-C / Dr. Lannette Donath    Reason for Consultation:     Iron deficiency anemia, Low Abd Pain        HPI:   Shelby Colon is a 57 y.o. y/o female referred for consultation & management  by Dana Allan, MD.    Patient saw Dr. Cathie Hoops hematologist 12/28/2022 to follow-up with chronic iron deficiency anemia.  Has been receiving IV iron infusions.  Has history of gastric bypass at age 52.  -Labs 12/30/2022 showed vitamin B12 of 341. -Lab 11/03/2022 showed mild anemia with hemoglobin 11.3.  Platelets 378.  Low ferritin 8.6.  -Colonoscopy 03/2019 by Dr. Maximino Greenland showed good prep and 1 small 4 mm Benign colon mucosal polyp removed from sigmoid colon. -Colonoscopy 12/2017 by Dr. Maximino Greenland (for positive Cologuard) showed one small 6 mm tubular adenoma polyp removed from ascending colon.  Had poor/fair prep. -Abd Ultrasound 03/2019 showed small gallstones and sludge.  Largest gallstone 4 mm.  No cholecystitis.  Common bile duct 4 mm.  Normal liver.  She has chronic back pain and prior back surgery.  She has RLQ and LLQ pain which comes and goes.  Has bowel irregularities with diarrhea alternating with constipation.  She denies melena, hematochezia, or weight loss.  Admits to weight gain.  Has a lot of abdominal bloating.  Denies nausea or vomiting.  Other than gastric bypass surgery, she has not had any other abdominal or pelvic surgeries.  Still has appendix and gallbladder.  Past Medical History:  Diagnosis Date   Abnormal MRI, cervical spine 10/17/2019   FINDINGS: Alignment: Reversal of the normal cervical lordosis with apex at C3-4. Trace retrolisthesis of C4 on C5, C5 on C6, and C6 on C7, with anterolisthesis of C7 on T1.  Findings chronic and facet mediated.   Vertebrae: Vertebral body height maintained without evidence for acute or chronic fracture. Bone marrow signal intensity within normal limits. No discrete or worrisome osseous lesions. Pro   Abnormal Pap smear of cervix    h/o LSIL pap with HPV +; 01/2019 pap negative negative HPV   Alcohol use 02/09/2022   Altered mental status 12/08/2021   Altered mental status 12/08/2021   Annual physical exam 06/12/2020   Anxiety    Anxiety 06/12/2020   Anxiety and depression 11/30/2017   Atrial fibrillation (HCC)    Atrophy of right kidney 03/05/2019   B12 deficiency    Benign neoplasm of ascending colon    Brain cyst 12/10/2021   Cardiac murmur 02/14/2018   Chronic knee pain (Bilateral) 09/07/2018   09/21/2018 left total knee ortho [place] [facility]    Chronic pain syndrome    Colon polyps    Confusion 12/10/2021   Congenital absence of right kidney    Degeneration of lumbar intervertebral disc    Depression    Early menopause    Elevated alkaline phosphatase level 02/23/2019   Elevated BP without diagnosis of hypertension 08/04/2021   Last Assessment & Plan: Formatting of this note might be different from the original. BP at last visit was 131/85 and today is 156/88.  Advised patient to come back within the next 2 to 4 weeks to recheck her blood pressure.  Goal BP  is less than 140/90.   Gallstones    Gastric bypass status for obesity    2013, weight 258lb   Headache    Heart murmur    MVP   History of chicken pox    HPV in female 11/30/2017   Hypercholesterolemia    Hypertension    Iron deficiency    Iron deficiency anemia 03/02/2019   Neuromuscular disorder (HCC)    Neuropathy    Personal history of colonic polyps    Pharmacologic therapy 10/17/2019   Polyp of sigmoid colon    Problems influencing health status 10/17/2019   Rheumatoid arthritis (HCC)    as child    Scoliosis of lumbar spine    SIADH (syndrome of  inappropriate ADH production) (HCC) 01/01/2022   Solitary kidney, congenital    pt thinks has left kidney only no right kidney   Thrombocytosis 02/23/2019   Thyroid disease    in the past    Transaminitis 12/08/2021   UTI (urinary tract infection)    Vitamin D deficiency     Past Surgical History:  Procedure Laterality Date   ANTERIOR CERVICAL DECOMPRESSION/DISCECTOMY FUSION 4 LEVELS N/A 11/28/2019   Procedure: ANTERIOR CERVICAL DECOMPRESSION/DISCECTOMY FUSION 4 LEVELS C3-7;  Surgeon: Venetia Night, MD;  Location: [facility] ORS;  Service: Neurosurgery;  Laterality: N/A;   AUGMENTATION MAMMAPLASTY Bilateral 2009   BACK SURGERY  11/04/2014   fusion of spine of lumbosacral region 2 rods and 16 screws    BREAST ENHANCEMENT SURGERY     COLONOSCOPY WITH PROPOFOL N/A 01/06/2018   Procedure: COLONOSCOPY WITH PROPOFOL;  Surgeon: Pasty Spillers, MD;  Location: [facility] ENDOSCOPY;  Service: Endoscopy;  Laterality: N/A;   COLONOSCOPY WITH PROPOFOL N/A 03/19/2019   Procedure: COLONOSCOPY WITH PROPOFOL;  Surgeon: Pasty Spillers, MD;  Location: [facility] ENDOSCOPY;  Service: Endoscopy;  Laterality: N/A;   COSMETIC SURGERY     excess skin removal     JOINT REPLACEMENT  09/20/2008   both knees done right was last year and left just done this   KNEE ARTHROSCOPY Bilateral    x 7 knees b/l (2 surgeries on right knee) total 1 bone graft and 5 arthroscopy total knee    ROUX-EN-Y PROCEDURE     SPINE SURGERY  11/07/2014   2 rods and 16 screws   TOTAL KNEE ARTHROPLASTY     left 09/21/18 Dr. Dema Severin II Franco Collet   TOTAL KNEE ARTHROPLASTY     right knee Dr. Elliot Gurney     Prior to Admission medications   Medication Sig Start Date End Date Taking? Authorizing Provider  cyclobenzaprine (FLEXERIL) 10 MG tablet Take 1 tablet (10 mg total) by mouth 3 (three) times daily as needed for muscle spasms. This can make you sleepy. 11/29/22   Drake Leach, PA-C  DULoxetine (CYMBALTA) 20 MG capsule  Take 40 mg by mouth daily. 01/03/23 01/03/24  [provider]  DULoxetine (CYMBALTA) 60 MG capsule Take 60 mg by mouth daily.    [provider]  fluticasone (FLONASE) 50 MCG/ACT nasal spray Place 2 sprays into both nostrils daily. 06/23/22   Dana Allan, MD  metoprolol succinate (TOPROL XL) 25 MG 24 hr tablet Take 1 tablet (25 mg total) by mouth daily. 11/15/22   Dana Allan, MD  predniSONE (DELTASONE) 50 MG tablet Take 1 tablet (50 mg total) by mouth See admin instructions. Prednisone - take 50 mg on the day prior to Venofer, 1 hour before Venofer. 01/03/23   Rickard Patience, MD  pregabalin (  LYRICA) 50 MG capsule TAKE ONE CAPSULE BY MOUTH TWICE A DAY AND TAKE ONE CAPSULE BY MOUTH AT BEDTIME 01/18/22   Worthy Rancher B, FNP  VITAMIN D-VITAMIN K PO Take by mouth.    [provider]    Family History  Adopted: Yes  Problem Relation Age of Onset   Arthritis Mother    Drug abuse Mother        heriod OD   Early death Mother    Diabetes Father    Hypertension Father    Depression Father    Arthritis Father    Drug abuse Father    Diabetes Sister    Fibromyalgia Sister    Rashes / Skin problems Sister        PSORIASIS   Diabetes Brother    Osteoarthritis Maternal Grandmother    Aneurysm Maternal Aunt    Thyroid disease Maternal Aunt    Fibromyalgia Maternal Aunt    Breast cancer Maternal Aunt    Cancer Maternal Aunt        breast cancer      Social History   Tobacco Use   Smoking status: Never   Smokeless tobacco: Never   Tobacco comments:    Never smoked  Vaping Use   Vaping status: Never Used  Substance Use Topics   Alcohol use: Not Currently    Comment: SOCIALLY. BUT JUST STOP.   Drug use: Never    Allergies as of 02/01/2023 - Review Complete 02/01/2023  Allergen Reaction Noted   Morphine Nausea And Vomiting 05/14/2014   Venofer [iron sucrose] Other (See Comments) 12/29/2022    Review of Systems:    All systems reviewed and negative except where noted  in HPI.   Physical Exam:  BP 123/80 (BP Location: Left Arm, Patient Position: Sitting, Cuff Size: Normal)   Pulse 80   Temp 98.4 F (36.9 C) (Oral)   Ht 5\' 2"  (1.575 m)   Wt 164 lb (74.4 kg)   BMI 30.00 kg/m  No LMP recorded. Patient is postmenopausal.  General:   Alert,  Well-developed, well-nourished, pleasant and cooperative in NAD Lungs:  Respirations even and unlabored.  Clear throughout to auscultation.   No wheezes, crackles, or rhonchi. No acute distress. Heart:  Regular rate and rhythm; no murmurs, clicks, rubs, or gallops. Abdomen:  Normal bowel sounds.  No bruits.  Soft, and non-distended without masses, hepatosplenomegaly or hernias noted.  Moderate RLQ Tenderness.  Mild LLQ tenderness.  No RUQ tenderness.  Negative Murphy's sign.  No guarding or rebound tenderness.    Neurologic:  Alert and oriented x3;  grossly normal neurologically. Psych:  Alert and cooperative. Normal mood and affect.  Imaging Studies: No results found.  Assessment and Plan:   Ginessa Baudin is a 57 y.o. y/o female has been referred for:  1.  Iron deficiency anemia; chronic most likely due to gastric bypass  Scheduling EGD to rule out upper GI source  Repeat colonoscopy due to history of adenomatous colon polyp  2-day prep for colonoscopy  Celiac lab   2.  Lower abdominal pain, bilateral lower abdomen; Mostly RLQ  Order CT abdomen pelvis with contrast; Rule out appendicitis and Diverticulitis.  IBS is in the differential.  Labs CBC, CMP  3.  Irregular bowel habits: Diarrhea alternating with constipation  Try Align Probiotic daily and Fiber Supplement.  4.  History of gastric bypass  5.  Cholelithiasis, appears to be asymptomatic, no RUQ pain or nausea/vomiting.  Rec. Low fat diet.  If becomes symptomatic, then refer to general surgeon to discuss CCY.  6.  History of adenomatous colon polyps  Scheduling repeat updated colonoscopy   Follow up 4 weeks after EGD / Colon  Procedures.  Celso Amy, PA-C

## 2023-02-03 ENCOUNTER — Ambulatory Visit: Payer: Medicare HMO | Admitting: Family Medicine

## 2023-02-03 LAB — COMPREHENSIVE METABOLIC PANEL
ALT: 53 IU/L — ABNORMAL HIGH (ref 0–32)
AST: 55 IU/L — ABNORMAL HIGH (ref 0–40)
Albumin: 4.3 g/dL (ref 3.8–4.9)
Alkaline Phosphatase: 129 IU/L — ABNORMAL HIGH (ref 44–121)
BUN/Creatinine Ratio: 22 (ref 9–23)
BUN: 19 mg/dL (ref 6–24)
Bilirubin Total: <0.2 mg/dL (ref 0.0–1.2)
CO2: 24 mmol/L (ref 20–29)
Calcium: 8.9 mg/dL (ref 8.7–10.2)
Chloride: 105 mmol/L (ref 96–106)
Creatinine, Ser: 0.88 mg/dL (ref 0.57–1.00)
Globulin, Total: 2 g/dL (ref 1.5–4.5)
Glucose: 74 mg/dL (ref 70–99)
Potassium: 4.6 mmol/L (ref 3.5–5.2)
Sodium: 142 mmol/L (ref 134–144)
Total Protein: 6.3 g/dL (ref 6.0–8.5)
eGFR: 77 mL/min/{1.73_m2} (ref >59)

## 2023-02-03 LAB — CBC WITH DIFFERENTIAL/PLATELET
Basophils Absolute: 0.1 10*3/uL (ref 0.0–0.2)
Basos: 1 %
EOS (ABSOLUTE): 0.1 10*3/uL (ref 0.0–0.4)
Eos: 2 %
Hematocrit: 38.4 % (ref 34.0–46.6)
Hemoglobin: 11.8 g/dL (ref 11.1–15.9)
Immature Grans (Abs): 0 10*3/uL (ref 0.0–0.1)
Immature Granulocytes: 0 %
Lymphocytes Absolute: 1.3 10*3/uL (ref 0.7–3.1)
Lymphs: 21 %
MCH: 26.5 pg — ABNORMAL LOW (ref 26.6–33.0)
MCHC: 30.7 g/dL — ABNORMAL LOW (ref 31.5–35.7)
MCV: 86 fL (ref 79–97)
Monocytes Absolute: 0.5 10*3/uL (ref 0.1–0.9)
Monocytes: 8 %
Neutrophils Absolute: 4.2 10*3/uL (ref 1.4–7.0)
Neutrophils: 68 %
Platelets: 360 10*3/uL (ref 150–450)
RBC: 4.45 x10E6/uL (ref 3.77–5.28)
RDW: 14.7 % (ref 11.7–15.4)
WBC: 6.3 10*3/uL (ref 3.4–10.8)

## 2023-02-03 LAB — CELIAC DISEASE AB SCREEN W/RFX
Antigliadin Abs, IgA: 5 U (ref 0–19)
IgA/Immunoglobulin A, Serum: 207 mg/dL (ref 87–352)
Transglutaminase IgA: <2 U/mL (ref 0–3)

## 2023-02-07 NOTE — Progress Notes (Signed)
THERAPIST PROGRESS NOTE  Session Time: 11:00AM-12:05PM  Participation Level: Active  Behavioral Response: Casual, Neat, and Well GroomedAlertAnxious  Type of Therapy: Individual Therapy  Treatment Goals addressed: Same goals addressed during this appt as previous appt  Develop and implement effective coping skills to carry out normal responsibilities and participate constructively in relationships as evidenced by self report.    Recall traumatic events without becoming overwhelmed with negative emotions  Reduce overall frequency, intensity and duration of depression so that daily functioning is not impaired per pt self report 3 out of 5 sessions documented.   Reduce overall frequency, intensity and duration of anxiety so that daily functioning is not impaired per pt self report 3 out of 5 sessions.   ProgressTowards Goals: Progressing  Interventions: CBT, DBT, Motivational Interviewing, Strength-based, and Other: ACT  Summary: Shelby Colon is a 57 y.o. female who presents with mixed sxs of anxiety and depression due to a hx of trauma. Sxs endorsed including but not limited to isolation, low mood, fatigue, worry, difficulty controlling worry, and irritability. Pt oriented to person, place, and time. Pt denies SI/HI or A/V hallucinations. Pt was cooperative during visit and was engaged throughout the visit. Pt does not report any other concerns at the time of visit. This information has been reviewed and remains accurate to pt experience.   Pt reported that she has been crafting a lot for self care.   Pt reported that she has noticed her blood pressure medication causing her to gain weight. Invited pt to talk to her med providers re: changes.   Pt reported that she reviewed her values inventory and learned that she is not living in alignment with her core values. Discussed impacts on mental health that result from not living in alignment with core values.   Pt utilized therapeutic  space to process past trauma and reported curiosity re: why she was treated the way she was when she stood up for herself. Discussed impacts of changing the power dynamic. Pt explored how the power dynamic was shifted by setting boundaries. Validated that pt is expert on her own experience.   Pt identified goals for self to improve her family, marriage, spirituality, and recreation. Pt explored small ways she can begin to pursue improvements in these areas of her life.    Pt engaged in discussion on values directed goal exploration. Pt identified values in therapy and barriers to aligning with values. Pt explored ways to overcome barriers to values driven goal pursuit. Provided pt validation of normalcy of values changing as self changes. Invited client to reflect on how values grow and shift with self growth and shifts. Educated pt on cognitive dissonance that occurs with engaging in behaviors that contradict values.   LCSW provided mood monitoring and treatment progress review in the context of this episode of treatment. LCSW reviewed the pt's mood status since last session.   Pt is continuing to apply interventions/techniques learned in session into daily life situations. Pt is currently on track to meet goals utilizing interventions that are discussed in session. Treatment to continue as indicated. Personal growth and progress toward goals noted above.  Continued Recommendations as followed: Self-care behaviors, positive social engagements, focusing on positive physical and emotional wellness, and focusing on life/work balance.    Suicidal/Homicidal: Nowithout intent/plan  Therapist Response:  Provided pt education re: acceptance. Discussed how to make acceptance accessible at all parts of pt's healing journey.   LCSW practiced active listening to validate pt participation, build rapport,  and create safe space for pt to feel heard as they are disclosing their thoughts and feelings.    Approached pt with strengths based perspective to assist pt in exploring strengths in moments of feeling low.   LCSW utilized therapeutic conversation skills informed by CBT, DBT, and ACT to expose pt to multiple ways of thinking about healing and to provide pt to access to multiple interventions.   Plan: Return again in 2 weeks.  Diagnosis: PTSD (post-traumatic stress disorder)    01/11/2023    2:15 PM 01/04/2023    4:59 PM 12/30/2022   10:50 AM 11/03/2022   10:30 AM  GAD 7 : Generalized Anxiety Score  Nervous, Anxious, on Edge 1 0 0 2  Control/stop worrying 3 2 3 3   Worry too much - different things 3 3 3 2   Trouble relaxing 0 0 0 1  Restless 0 0 0   Easily annoyed or irritable 1 1 1 3   Afraid - awful might happen 0 0 1 1  Total GAD 7 Score 8 6 8    Anxiety Difficulty  Somewhat difficult Somewhat difficult Somewhat difficult       01/11/2023    2:14 PM 01/04/2023    4:58 PM 12/30/2022   10:49 AM  Depression screen PHQ 2/9  Decreased Interest 2 1 3   Down, Depressed, Hopeless 2 3 2   PHQ - 2 Score 4 4 5   Altered sleeping 3 3 3   Tired, decreased energy 3 3 2   Change in appetite 3 1 3   Feeling bad or failure about yourself  1 2 2   Trouble concentrating 0 0 0  Moving slowly or fidgety/restless 0 0 0  Suicidal thoughts 0 0 0  PHQ-9 Score 14 13 15   Difficult doing work/chores Somewhat difficult Somewhat difficult Somewhat difficult    Collaboration of Care: Other None at this time  Patient/Guardian was advised Release of Information must be obtained prior to any record release in order to collaborate their care with an outside provider. Patient/Guardian was advised if they have not already done so to contact the registration department to sign all necessary forms in order for [place] to release information regarding their care.   Consent: Patient/Guardian gives verbal consent for treatment and assignment of benefits for services provided during this visit. Patient/Guardian expressed  understanding and agreed to proceed.   Geoffry Paradise, LCSW 09/15/2022

## 2023-02-08 ENCOUNTER — Ambulatory Visit
Admission: RE | Admit: 2023-02-08 | Discharge: 2023-02-08 | Disposition: A | Discharge status: Home / Self Care | Payer: Medicare HMO | Source: Ambulatory Visit | Attending: Physician Assistant | Admitting: Physician Assistant

## 2023-02-08 DIAGNOSIS — R1031 Right lower quadrant pain: Secondary | ICD-10-CM | POA: Diagnosis not present

## 2023-02-08 DIAGNOSIS — R1032 Left lower quadrant pain: Secondary | ICD-10-CM | POA: Insufficient documentation

## 2023-02-08 MED ORDER — IOHEXOL 300 MG/ML  SOLN
100.0000 mL | Freq: Once | INTRAMUSCULAR | Status: AC | PRN
  Administered 2023-02-08: 100 mL via INTRAVENOUS

## 2023-02-08 NOTE — Progress Notes (Signed)
THERAPIST PROGRESS NOTE  Session Time: 11:02AM-12:02PM  Participation Level: Active  Behavioral Response: Casual, Neat, and Well GroomedAlertAnxious  Type of Therapy: Individual Therapy  Treatment Goals addressed: Same goals addressed during this appt as previous appt  Develop and implement effective coping skills to carry out normal responsibilities and participate constructively in relationships as evidenced by self report.    Recall traumatic events without becoming overwhelmed with negative emotions  Reduce overall frequency, intensity and duration of depression so that daily functioning is not impaired per pt self report 3 out of 5 sessions documented.   Reduce overall frequency, intensity and duration of anxiety so that daily functioning is not impaired per pt self report 3 out of 5 sessions.   ProgressTowards Goals: Progressing  Interventions: CBT, DBT, Motivational Interviewing, Strength-based, and Other: ACT  Virtual Visit via Video Note  I connected with Shelby Colon on 12/14/2022 at 11:00 AM EDT by a video enabled telemedicine application and verified that I am speaking with the correct person using two identifiers.  Location: Patient: located in pt home Provider: working remotely in [place], [place]   I discussed the limitations of evaluation and management by telemedicine and the availability of in person appointments. The patient expressed understanding and agreed to proceed.  I discussed the assessment and treatment plan with the patient. The patient was provided an opportunity to ask questions and all were answered. The patient agreed with the plan and demonstrated an understanding of the instructions.   The patient was advised to call back or seek an in-person evaluation if the symptoms worsen or if the condition fails to improve as anticipated.  I provided 60 minutes of non-face-to-face time during this encounter.   Geoffry Paradise, LCSW  Summary: Shelby Colon is a 57 y.o. female who presents with mixed sxs of anxiety and depression due to a hx of trauma. Sxs endorsed including but not limited to isolation, low mood, fatigue, worry, difficulty controlling worry, and irritability. Pt oriented to person, place, and time. Pt denies SI/HI or A/V hallucinations. Pt was cooperative during visit and was engaged throughout the visit. Pt does not report any other concerns at the time of visit. This information has been reviewed and remains accurate to pt experience.   Pt reported that she has been crafting a lot for self care.   Pt reported that she has noticed her blood pressure medication causing her to gain weight. Invited pt to talk to her med providers re: changes.   Pt reported that she reviewed her values inventory and learned that she is not living in alignment with her core values. Discussed impacts on mental health that result from not living in alignment with core values.   Pt utilized therapeutic space to process past trauma and reported curiosity re: why she was treated the way she was when she stood up for herself. Discussed impacts of changing the power dynamic. Pt explored how the power dynamic was shifted by setting boundaries. Validated that pt is expert on her own experience.   Pt identified goals for self to improve her family, marriage, spirituality, and recreation. Pt explored small ways she can begin to pursue improvements in these areas of her life.    Pt engaged in discussion on values directed goal exploration. Pt identified values in therapy and barriers to aligning with values. Pt explored ways to overcome barriers to values driven goal pursuit. Provided pt validation of normalcy of values changing as self changes. Invited client to  reflect on how values grow and shift with self growth and shifts. Educated pt on cognitive dissonance that occurs with engaging in behaviors that contradict values.   LCSW provided mood monitoring and  treatment progress review in the context of this episode of treatment. LCSW reviewed the pt's mood status since last session.   Pt is continuing to apply interventions/techniques learned in session into daily life situations. Pt is currently on track to meet goals utilizing interventions that are discussed in session. Treatment to continue as indicated. Personal growth and progress toward goals noted above.  Continued Recommendations as followed: Self-care behaviors, positive social engagements, focusing on positive physical and emotional wellness, and focusing on life/work balance.    Suicidal/Homicidal: Nowithout intent/plan  Therapist Response:  Provided pt education re: acceptance. Discussed how to make acceptance accessible at all parts of pt's healing journey.   LCSW practiced active listening to validate pt participation, build rapport, and create safe space for pt to feel heard as they are disclosing their thoughts and feelings.   Approached pt with strengths based perspective to assist pt in exploring strengths in moments of feeling low.   LCSW utilized therapeutic conversation skills informed by CBT, DBT, and ACT to expose pt to multiple ways of thinking about healing and to provide pt to access to multiple interventions.   Plan: Return again in 2 weeks.  Diagnosis: PTSD (post-traumatic stress disorder)    01/11/2023    2:15 PM 01/04/2023    4:59 PM 12/30/2022   10:50 AM 11/03/2022   10:30 AM  GAD 7 : Generalized Anxiety Score  Nervous, Anxious, on Edge 1 0 0 2  Control/stop worrying 3 2 3 3   Worry too much - different things 3 3 3 2   Trouble relaxing 0 0 0 1  Restless 0 0 0   Easily annoyed or irritable 1 1 1 3   Afraid - awful might happen 0 0 1 1  Total GAD 7 Score 8 6 8    Anxiety Difficulty  Somewhat difficult Somewhat difficult Somewhat difficult       01/11/2023    2:14 PM 01/04/2023    4:58 PM 12/30/2022   10:49 AM  Depression screen PHQ 2/9  Decreased Interest 2 1 3    Down, Depressed, Hopeless 2 3 2   PHQ - 2 Score 4 4 5   Altered sleeping 3 3 3   Tired, decreased energy 3 3 2   Change in appetite 3 1 3   Feeling bad or failure about yourself  1 2 2   Trouble concentrating 0 0 0  Moving slowly or fidgety/restless 0 0 0  Suicidal thoughts 0 0 0  PHQ-9 Score 14 13 15   Difficult doing work/chores Somewhat difficult Somewhat difficult Somewhat difficult    Collaboration of Care: Other None at this time  Patient/Guardian was advised Release of Information must be obtained prior to any record release in order to collaborate their care with an outside provider. Patient/Guardian was advised if they have not already done so to contact the registration department to sign all necessary forms in order for [place] to release information regarding their care.   Consent: Patient/Guardian gives verbal consent for treatment and assignment of benefits for services provided during this visit. Patient/Guardian expressed understanding and agreed to proceed.   Memory Dance Shelby Burkel, LCSW

## 2023-02-08 NOTE — Progress Notes (Signed)
THERAPIST PROGRESS NOTE  Session Time: 10:02AM-10:58AM  Participation Level: Active  Behavioral Response: Casual, Neat, and Well GroomedAlertAnxious  Type of Therapy: Individual Therapy  Treatment Goals addressed: Same goals addressed during this appt as previous appt  Develop and implement effective coping skills to carry out normal responsibilities and participate constructively in relationships as evidenced by self report.    Recall traumatic events without becoming overwhelmed with negative emotions  Reduce overall frequency, intensity and duration of depression so that daily functioning is not impaired per pt self report 3 out of 5 sessions documented.   Reduce overall frequency, intensity and duration of anxiety so that daily functioning is not impaired per pt self report 3 out of 5 sessions.   ProgressTowards Goals: Progressing  Interventions: CBT, DBT, Motivational Interviewing, Strength-based, and Other: ACT   Summary: Shelby Colon is a 57 y.o. female who presents with mixed sxs of anxiety and depression due to a hx of trauma. Sxs endorsed including but not limited to isolation, low mood, fatigue, worry, difficulty controlling worry, and irritability. Pt oriented to person, place, and time. Pt denies SI/HI or A/V hallucinations. Pt was cooperative during visit and was engaged throughout the visit. Pt does not report any other concerns at the time of visit. This information has been reviewed and remains accurate to pt experience.   Pt reported that she has been crafting a lot for self care.   Pt reported that she has noticed her blood pressure medication causing her to gain weight. Invited pt to talk to her med providers re: changes.   Pt reported that she reviewed her values inventory and learned that she is not living in alignment with her core values. Discussed impacts on mental health that result from not living in alignment with core values.   Pt utilized therapeutic  space to process past trauma and reported curiosity re: why she was treated the way she was when she stood up for herself. Discussed impacts of changing the power dynamic. Pt explored how the power dynamic was shifted by setting boundaries. Validated that pt is expert on her own experience.   Pt identified goals for self to improve her family, marriage, spirituality, and recreation. Pt explored small ways she can begin to pursue improvements in these areas of her life.    Pt engaged in discussion on values directed goal exploration. Pt identified values in therapy and barriers to aligning with values. Pt explored ways to overcome barriers to values driven goal pursuit. Provided pt validation of normalcy of values changing as self changes. Invited client to reflect on how values grow and shift with self growth and shifts. Educated pt on cognitive dissonance that occurs with engaging in behaviors that contradict values.   LCSW provided mood monitoring and treatment progress review in the context of this episode of treatment. LCSW reviewed the pt's mood status since last session.   Pt is continuing to apply interventions/techniques learned in session into daily life situations. Pt is currently on track to meet goals utilizing interventions that are discussed in session. Treatment to continue as indicated. Personal growth and progress toward goals noted above.  Continued Recommendations as followed: Self-care behaviors, positive social engagements, focusing on positive physical and emotional wellness, and focusing on life/work balance.    Suicidal/Homicidal: Nowithout intent/plan  Therapist Response:  Provided pt education re: acceptance. Discussed how to make acceptance accessible at all parts of pt's healing journey.   LCSW practiced active listening to validate pt participation, build  rapport, and create safe space for pt to feel heard as they are disclosing their thoughts and feelings.    Approached pt with strengths based perspective to assist pt in exploring strengths in moments of feeling low.   LCSW utilized therapeutic conversation skills informed by CBT, DBT, and ACT to expose pt to multiple ways of thinking about healing and to provide pt to access to multiple interventions.   Plan: Return again in 2 weeks.  Diagnosis: PTSD (post-traumatic stress disorder)    01/11/2023    2:15 PM 01/04/2023    4:59 PM 12/30/2022   10:50 AM 11/03/2022   10:30 AM  GAD 7 : Generalized Anxiety Score  Nervous, Anxious, on Edge 1 0 0 2  Control/stop worrying 3 2 3 3   Worry too much - different things 3 3 3 2   Trouble relaxing 0 0 0 1  Restless 0 0 0   Easily annoyed or irritable 1 1 1 3   Afraid - awful might happen 0 0 1 1  Total GAD 7 Score 8 6 8    Anxiety Difficulty  Somewhat difficult Somewhat difficult Somewhat difficult       01/11/2023    2:14 PM 01/04/2023    4:58 PM 12/30/2022   10:49 AM  Depression screen PHQ 2/9  Decreased Interest 2 1 3   Down, Depressed, Hopeless 2 3 2   PHQ - 2 Score 4 4 5   Altered sleeping 3 3 3   Tired, decreased energy 3 3 2   Change in appetite 3 1 3   Feeling bad or failure about yourself  1 2 2   Trouble concentrating 0 0 0  Moving slowly or fidgety/restless 0 0 0  Suicidal thoughts 0 0 0  PHQ-9 Score 14 13 15   Difficult doing work/chores Somewhat difficult Somewhat difficult Somewhat difficult    Collaboration of Care: Other None at this time  Patient/Guardian was advised Release of Information must be obtained prior to any record release in order to collaborate their care with an outside provider. Patient/Guardian was advised if they have not already done so to contact the registration department to sign all necessary forms in order for [place] to release information regarding their care.   Consent: Patient/Guardian gives verbal consent for treatment and assignment of benefits for services provided during this visit. Patient/Guardian expressed  understanding and agreed to proceed.   Memory Dance Osiah Haring, LCSW

## 2023-02-08 NOTE — Progress Notes (Signed)
THERAPIST PROGRESS NOTE  Session Time: 10:08AM-11:03AM  Participation Level: Active  Behavioral Response: Casual, Neat, and Well GroomedAlertAnxious  Type of Therapy: Individual Therapy  Treatment Goals addressed: Same goals addressed during this appt as previous appt  Develop and implement effective coping skills to carry out normal responsibilities and participate constructively in relationships as evidenced by self report.    Recall traumatic events without becoming overwhelmed with negative emotions  Reduce overall frequency, intensity and duration of depression so that daily functioning is not impaired per pt self report 3 out of 5 sessions documented.   Reduce overall frequency, intensity and duration of anxiety so that daily functioning is not impaired per pt self report 3 out of 5 sessions.   ProgressTowards Goals: Progressing  Interventions: CBT, DBT, Motivational Interviewing, Strength-based, and Other: ACT   Summary: Shelby Colon is a 57 y.o. female who presents with mixed sxs of anxiety and depression due to a hx of trauma. Sxs endorsed including but not limited to isolation, low mood, fatigue, worry, difficulty controlling worry, and irritability. Pt oriented to person, place, and time. Pt denies SI/HI or A/V hallucinations. Pt was cooperative during visit and was engaged throughout the visit. Pt does not report any other concerns at the time of visit. This information has been reviewed and remains accurate to pt experience.   Pt reported that she has been crafting a lot for self care.   Pt reported that she has noticed her blood pressure medication causing her to gain weight. Invited pt to talk to her med providers re: changes.   Pt reported that she reviewed her values inventory and learned that she is not living in alignment with her core values. Discussed impacts on mental health that result from not living in alignment with core values.   Pt utilized therapeutic  space to process past trauma and reported curiosity re: why she was treated the way she was when she stood up for herself. Discussed impacts of changing the power dynamic. Pt explored how the power dynamic was shifted by setting boundaries. Validated that pt is expert on her own experience.   Pt identified goals for self to improve her family, marriage, spirituality, and recreation. Pt explored small ways she can begin to pursue improvements in these areas of her life.    Pt engaged in discussion on values directed goal exploration. Pt identified values in therapy and barriers to aligning with values. Pt explored ways to overcome barriers to values driven goal pursuit. Provided pt validation of normalcy of values changing as self changes. Invited client to reflect on how values grow and shift with self growth and shifts. Educated pt on cognitive dissonance that occurs with engaging in behaviors that contradict values.   LCSW provided mood monitoring and treatment progress review in the context of this episode of treatment. LCSW reviewed the pt's mood status since last session.   Pt is continuing to apply interventions/techniques learned in session into daily life situations. Pt is currently on track to meet goals utilizing interventions that are discussed in session. Treatment to continue as indicated. Personal growth and progress toward goals noted above.  Continued Recommendations as followed: Self-care behaviors, positive social engagements, focusing on positive physical and emotional wellness, and focusing on life/work balance.    Suicidal/Homicidal: Nowithout intent/plan  Therapist Response:  Provided pt education re: acceptance. Discussed how to make acceptance accessible at all parts of pt's healing journey.   LCSW practiced active listening to validate pt participation, build  rapport, and create safe space for pt to feel heard as they are disclosing their thoughts and feelings.    Approached pt with strengths based perspective to assist pt in exploring strengths in moments of feeling low.   LCSW utilized therapeutic conversation skills informed by CBT, DBT, and ACT to expose pt to multiple ways of thinking about healing and to provide pt to access to multiple interventions.   Plan: Return again in 2 weeks. Pt has requested to transition care to [facility] for all future appts in order to continue receiving therapeutic services from [facility]. Pt was provided with other options for continuing care and reported to continue with [facility] for continuity of care.   Diagnosis: PTSD (post-traumatic stress disorder)    01/11/2023    2:15 PM 01/04/2023    4:59 PM 12/30/2022   10:50 AM 11/03/2022   10:30 AM  GAD 7 : Generalized Anxiety Score  Nervous, Anxious, on Edge 1 0 0 2  Control/stop worrying 3 2 3 3   Worry too much - different things 3 3 3 2   Trouble relaxing 0 0 0 1  Restless 0 0 0   Easily annoyed or irritable 1 1 1 3   Afraid - awful might happen 0 0 1 1  Total GAD 7 Score 8 6 8    Anxiety Difficulty  Somewhat difficult Somewhat difficult Somewhat difficult       01/11/2023    2:14 PM 01/04/2023    4:58 PM 12/30/2022   10:49 AM  Depression screen PHQ 2/9  Decreased Interest 2 1 3   Down, Depressed, Hopeless 2 3 2   PHQ - 2 Score 4 4 5   Altered sleeping 3 3 3   Tired, decreased energy 3 3 2   Change in appetite 3 1 3   Feeling bad or failure about yourself  1 2 2   Trouble concentrating 0 0 0  Moving slowly or fidgety/restless 0 0 0  Suicidal thoughts 0 0 0  PHQ-9 Score 14 13 15   Difficult doing work/chores Somewhat difficult Somewhat difficult Somewhat difficult    Collaboration of Care: Other None at this time  Patient/Guardian was advised Release of Information must be obtained prior to any record release in order to collaborate their care with an outside provider. Patient/Guardian was advised if they have not already done so to  contact the registration department to sign all necessary forms in order for [place] to release information regarding their care.   Consent: Patient/Guardian gives verbal consent for treatment and assignment of benefits for services provided during this visit. Patient/Guardian expressed understanding and agreed to proceed.   Memory Dance Jmichael Gille, LCSW

## 2023-02-08 NOTE — Progress Notes (Signed)
THERAPIST PROGRESS NOTE  Session Time: 8:05AM-8:58AM  Participation Level: Active  Behavioral Response: Casual, Neat, and Well GroomedAlertAnxious  Type of Therapy: Individual Therapy  Treatment Goals addressed: Same goals addressed during this appt as previous appt  Develop and implement effective coping skills to carry out normal responsibilities and participate constructively in relationships as evidenced by self report.    Recall traumatic events without becoming overwhelmed with negative emotions  Reduce overall frequency, intensity and duration of depression so that daily functioning is not impaired per pt self report 3 out of 5 sessions documented.   Reduce overall frequency, intensity and duration of anxiety so that daily functioning is not impaired per pt self report 3 out of 5 sessions.   ProgressTowards Goals: Progressing  Interventions: CBT, DBT, Motivational Interviewing, Strength-based, and Other: ACT   Virtual Visit via Video Note  I connected with Shelby Colon on 01/25/2023 at  8:00 AM EDT by a video enabled telemedicine application and verified that I am speaking with the correct person using two identifiers.  Location: Patient: pt home Provider: working remotely [place], [place]   I discussed the limitations of evaluation and management by telemedicine and the availability of in person appointments. The patient expressed understanding and agreed to proceed.  I discussed the assessment and treatment plan with the patient. The patient was provided an opportunity to ask questions and all were answered. The patient agreed with the plan and demonstrated an understanding of the instructions.   The patient was advised to call back or seek an in-person evaluation if the symptoms worsen or if the condition fails to improve as anticipated.  I provided 53 minutes of non-face-to-face time during this encounter.   Geoffry Paradise, LCSW  Summary: Shelby Colon is a 57  y.o. female who presents with mixed sxs of anxiety and depression due to a hx of trauma. Sxs endorsed including but not limited to isolation, low mood, fatigue, worry, difficulty controlling worry, and irritability. Pt oriented to person, place, and time. Pt denies SI/HI or A/V hallucinations. Pt was cooperative during visit and was engaged throughout the visit. Pt does not report any other concerns at the time of visit. This information has been reviewed and remains accurate to pt experience.   Pt reported that she has been crafting a lot for self care.   Pt reported that she has noticed her blood pressure medication causing her to gain weight. Invited pt to talk to her med providers re: changes.   Pt reported that she reviewed her values inventory and learned that she is not living in alignment with her core values. Discussed impacts on mental health that result from not living in alignment with core values.   Pt utilized therapeutic space to process past trauma and reported curiosity re: why she was treated the way she was when she stood up for herself. Discussed impacts of changing the power dynamic. Pt explored how the power dynamic was shifted by setting boundaries. Validated that pt is expert on her own experience.   Pt identified goals for self to improve her family, marriage, spirituality, and recreation. Pt explored small ways she can begin to pursue improvements in these areas of her life.    Pt engaged in discussion on values directed goal exploration. Pt identified values in therapy and barriers to aligning with values. Pt explored ways to overcome barriers to values driven goal pursuit. Provided pt validation of normalcy of values changing as self changes. Invited client to reflect  on how values grow and shift with self growth and shifts. Educated pt on cognitive dissonance that occurs with engaging in behaviors that contradict values.   LCSW provided mood monitoring and treatment  progress review in the context of this episode of treatment. LCSW reviewed the pt's mood status since last session.   Pt is continuing to apply interventions/techniques learned in session into daily life situations. Pt is currently on track to meet goals utilizing interventions that are discussed in session. Treatment to continue as indicated. Personal growth and progress toward goals noted above.  Continued Recommendations as followed: Self-care behaviors, positive social engagements, focusing on positive physical and emotional wellness, and focusing on life/work balance.    Suicidal/Homicidal: Nowithout intent/plan  Therapist Response:  Provided pt education re: acceptance. Discussed how to make acceptance accessible at all parts of pt's healing journey.   LCSW practiced active listening to validate pt participation, build rapport, and create safe space for pt to feel heard as they are disclosing their thoughts and feelings.   Approached pt with strengths based perspective to assist pt in exploring strengths in moments of feeling low.   LCSW utilized therapeutic conversation skills informed by CBT, DBT, and ACT to expose pt to multiple ways of thinking about healing and to provide pt to access to multiple interventions.   Plan:  Pt has requested to transition care to [facility] for all future appts in order to continue receiving therapeutic services from [facility]. Pt was provided with other options for continuing care and reported to continue with [facility] for continuity of care.   Diagnosis: PTSD (post-traumatic stress disorder)    01/11/2023    2:15 PM 01/04/2023    4:59 PM 12/30/2022   10:50 AM 11/03/2022   10:30 AM  GAD 7 : Generalized Anxiety Score  Nervous, Anxious, on Edge 1 0 0 2  Control/stop worrying 3 2 3 3   Worry too much - different things 3 3 3 2   Trouble relaxing 0 0 0 1  Restless 0 0 0   Easily annoyed or irritable 1 1 1 3   Afraid - awful  might happen 0 0 1 1  Total GAD 7 Score 8 6 8    Anxiety Difficulty  Somewhat difficult Somewhat difficult Somewhat difficult       01/11/2023    2:14 PM 01/04/2023    4:58 PM 12/30/2022   10:49 AM  Depression screen PHQ 2/9  Decreased Interest 2 1 3   Down, Depressed, Hopeless 2 3 2   PHQ - 2 Score 4 4 5   Altered sleeping 3 3 3   Tired, decreased energy 3 3 2   Change in appetite 3 1 3   Feeling bad or failure about yourself  1 2 2   Trouble concentrating 0 0 0  Moving slowly or fidgety/restless 0 0 0  Suicidal thoughts 0 0 0  PHQ-9 Score 14 13 15   Difficult doing work/chores Somewhat difficult Somewhat difficult Somewhat difficult    Collaboration of Care: Other None at this time  Patient/Guardian was advised Release of Information must be obtained prior to any record release in order to collaborate their care with an outside provider. Patient/Guardian was advised if they have not already done so to contact the registration department to sign all necessary forms in order for [place] to release information regarding their care.   Consent: Patient/Guardian gives verbal consent for treatment and assignment of benefits for services provided during this visit. Patient/Guardian expressed understanding and agreed to proceed.  Memory Dance Kaleiah Kutzer, LCSW

## 2023-02-08 NOTE — Progress Notes (Signed)
THERAPIST PROGRESS NOTE  Session Time: 11:06AM-12:00PM  Participation Level: Active  Behavioral Response: Casual, Neat, and Well GroomedAlertAnxious  Type of Therapy: Individual Therapy  Treatment Goals addressed: Same goals addressed during this appt as previous appt  Develop and implement effective coping skills to carry out normal responsibilities and participate constructively in relationships as evidenced by self report.    Recall traumatic events without becoming overwhelmed with negative emotions  Reduce overall frequency, intensity and duration of depression so that daily functioning is not impaired per pt self report 3 out of 5 sessions documented.   Reduce overall frequency, intensity and duration of anxiety so that daily functioning is not impaired per pt self report 3 out of 5 sessions.   ProgressTowards Goals: Progressing  Interventions: CBT, DBT, Motivational Interviewing, Strength-based, and Other: ACT  Summary: Shelby Colon is a 57 y.o. female who presents with mixed sxs of anxiety and depression due to a hx of trauma. Sxs endorsed including but not limited to isolation, low mood, fatigue, worry, difficulty controlling worry, and irritability. Pt oriented to person, place, and time. Pt denies SI/HI or A/V hallucinations. Pt was cooperative during visit and was engaged throughout the visit. Pt does not report any other concerns at the time of visit. This information has been reviewed and remains accurate to pt experience.   Pt reported that she has been crafting a lot for self care.   Pt reported that she has noticed her blood pressure medication causing her to gain weight. Invited pt to talk to her med providers re: changes.   Pt reported that she reviewed her values inventory and learned that she is not living in alignment with her core values. Discussed impacts on mental health that result from not living in alignment with core values.   Pt utilized therapeutic  space to process past trauma and reported curiosity re: why she was treated the way she was when she stood up for herself. Discussed impacts of changing the power dynamic. Pt explored how the power dynamic was shifted by setting boundaries. Validated that pt is expert on her own experience.   Pt identified goals for self to improve her family, marriage, spirituality, and recreation. Pt explored small ways she can begin to pursue improvements in these areas of her life.    Pt engaged in discussion on values directed goal exploration. Pt identified values in therapy and barriers to aligning with values. Pt explored ways to overcome barriers to values driven goal pursuit. Provided pt validation of normalcy of values changing as self changes. Invited client to reflect on how values grow and shift with self growth and shifts. Educated pt on cognitive dissonance that occurs with engaging in behaviors that contradict values.   LCSW provided mood monitoring and treatment progress review in the context of this episode of treatment. LCSW reviewed the pt's mood status since last session.   Pt is continuing to apply interventions/techniques learned in session into daily life situations. Pt is currently on track to meet goals utilizing interventions that are discussed in session. Treatment to continue as indicated. Personal growth and progress toward goals noted above.  Continued Recommendations as followed: Self-care behaviors, positive social engagements, focusing on positive physical and emotional wellness, and focusing on life/work balance.    Suicidal/Homicidal: Nowithout intent/plan  Therapist Response:  Provided pt education re: acceptance. Discussed how to make acceptance accessible at all parts of pt's healing journey.   LCSW practiced active listening to validate pt participation, build rapport,  and create safe space for pt to feel heard as they are disclosing their thoughts and feelings.    Approached pt with strengths based perspective to assist pt in exploring strengths in moments of feeling low.   LCSW utilized therapeutic conversation skills informed by CBT, DBT, and ACT to expose pt to multiple ways of thinking about healing and to provide pt to access to multiple interventions.   Plan: Return again in 2 weeks.  Diagnosis: PTSD (post-traumatic stress disorder)    01/11/2023    2:15 PM 01/04/2023    4:59 PM 12/30/2022   10:50 AM 11/03/2022   10:30 AM  GAD 7 : Generalized Anxiety Score  Nervous, Anxious, on Edge 1 0 0 2  Control/stop worrying 3 2 3 3   Worry too much - different things 3 3 3 2   Trouble relaxing 0 0 0 1  Restless 0 0 0   Easily annoyed or irritable 1 1 1 3   Afraid - awful might happen 0 0 1 1  Total GAD 7 Score 8 6 8    Anxiety Difficulty  Somewhat difficult Somewhat difficult Somewhat difficult       01/11/2023    2:14 PM 01/04/2023    4:58 PM 12/30/2022   10:49 AM  Depression screen PHQ 2/9  Decreased Interest 2 1 3   Down, Depressed, Hopeless 2 3 2   PHQ - 2 Score 4 4 5   Altered sleeping 3 3 3   Tired, decreased energy 3 3 2   Change in appetite 3 1 3   Feeling bad or failure about yourself  1 2 2   Trouble concentrating 0 0 0  Moving slowly or fidgety/restless 0 0 0  Suicidal thoughts 0 0 0  PHQ-9 Score 14 13 15   Difficult doing work/chores Somewhat difficult Somewhat difficult Somewhat difficult    Collaboration of Care: Other None at this time  Patient/Guardian was advised Release of Information must be obtained prior to any record release in order to collaborate their care with an outside provider. Patient/Guardian was advised if they have not already done so to contact the registration department to sign all necessary forms in order for [place] to release information regarding their care.   Consent: Patient/Guardian gives verbal consent for treatment and assignment of benefits for services provided during this visit. Patient/Guardian expressed  understanding and agreed to proceed.   Memory Dance Zylee Marchiano, LCSW

## 2023-02-08 NOTE — Telephone Encounter (Signed)
Erroneous encounter

## 2023-02-09 ENCOUNTER — Ambulatory Visit: Payer: Medicare HMO | Admitting: Family Medicine

## 2023-02-11 ENCOUNTER — Ambulatory Visit (INDEPENDENT_AMBULATORY_CARE_PROVIDER_SITE_OTHER): Payer: Medicare HMO | Admitting: Nurse Practitioner

## 2023-02-11 ENCOUNTER — Encounter: Payer: Self-pay | Admitting: Nurse Practitioner

## 2023-02-11 VITALS — BP 134/86 | HR 76 | Temp 98.1°F | Resp 16 | Ht 62.0 in | Wt 161.4 lb

## 2023-02-11 DIAGNOSIS — N3000 Acute cystitis without hematuria: Secondary | ICD-10-CM | POA: Diagnosis not present

## 2023-02-11 DIAGNOSIS — R8290 Unspecified abnormal findings in urine: Secondary | ICD-10-CM

## 2023-02-11 DIAGNOSIS — R300 Dysuria: Secondary | ICD-10-CM

## 2023-02-11 LAB — POC URINALSYSI DIPSTICK (AUTOMATED)
Bilirubin, UA: NEGATIVE
Blood, UA: NEGATIVE
Glucose, UA: NEGATIVE
Ketones, UA: NEGATIVE
Nitrite, UA: POSITIVE
Protein, UA: NEGATIVE
Spec Grav, UA: 1.02 (ref 1.010–1.025)
Urobilinogen, UA: 0.2 U/dL
pH, UA: 5.5 (ref 5.0–8.0)

## 2023-02-11 MED ORDER — CEFDINIR 300 MG PO CAPS: 300.0000 mg | ORAL_CAPSULE | Freq: Two times a day (BID) | ORAL | 0 refills | Status: AC

## 2023-02-11 NOTE — Progress Notes (Unsigned)
Established Patient Office Visit  Subjective:  Patient ID: Shelby Colon, female    DOB: 1966-01-24  Age: 57 y.o. MRN: [id number]  CC:  Chief Complaint  Patient presents with   Urinary Tract Infection    HPI  Shelby Colon presents for:  Dysuria  This is a new problem. Episode onset: 10 days ago. The problem occurs every urination. The problem has been unchanged. The quality of the pain is described as burning. There has been no fever. Pertinent negatives include no discharge, frequency or urgency. She has tried nothing for the symptoms.    Patient also requesting paperwork for disabled parking placard.  Past Medical History:  Diagnosis Date   Abnormal MRI, cervical spine 10/17/2019   FINDINGS: Alignment: Reversal of the normal cervical lordosis with apex at C3-4. Trace retrolisthesis of C4 on C5, C5 on C6, and C6 on C7, with anterolisthesis of C7 on T1. Findings chronic and facet mediated.   Vertebrae: Vertebral body height maintained without evidence for acute or chronic fracture. Bone marrow signal intensity within normal limits. No discrete or worrisome osseous lesions. Pro   Abnormal Pap smear of cervix    h/o LSIL pap with HPV +; 01/2019 pap negative negative HPV   Alcohol use 02/09/2022   Altered mental status 12/08/2021   Altered mental status 12/08/2021   Annual physical exam 06/12/2020   Anxiety    Anxiety 06/12/2020   Anxiety and depression 11/30/2017   Atrial fibrillation (HCC)    Atrophy of right kidney 03/05/2019   B12 deficiency    Benign neoplasm of ascending colon    Brain cyst 12/10/2021   Cardiac murmur 02/14/2018   Chronic knee pain (Bilateral) 09/07/2018   09/21/2018 left total knee ortho [place] [facility]. [facility]    Chronic pain syndrome    Colon polyps    Confusion 12/10/2021   Congenital absence of right kidney    Degeneration of lumbar intervertebral disc    Depression    Early menopause    Elevated alkaline phosphatase level  02/23/2019   Elevated BP without diagnosis of hypertension 08/04/2021   Last Assessment & Plan: Formatting of this note might be different from the original. BP at last visit was 131/85 and today is 156/88.  Advised patient to come back within the next 2 to 4 weeks to recheck her blood pressure.  Goal BP is less than 140/90.   Gallstones    Gastric bypass status for obesity    2013, weight 258lb   Headache    Heart murmur    MVP   History of chicken pox    HPV in female 11/30/2017   Hypercholesterolemia    Hypertension    Iron deficiency    Iron deficiency anemia 03/02/2019   Neuromuscular disorder (HCC)    Neuropathy    Personal history of colonic polyps    Pharmacologic therapy 10/17/2019   Polyp of sigmoid colon    Problems influencing health status 10/17/2019   Rheumatoid arthritis (HCC)    as child    Scoliosis of lumbar spine    SIADH (syndrome of inappropriate ADH production) (HCC) 01/01/2022   Solitary kidney, congenital    pt thinks has left kidney only no right kidney   Thrombocytosis 02/23/2019   Thyroid disease    in the past    Transaminitis 12/08/2021   UTI (urinary tract infection)    Vitamin D deficiency     Past Surgical History:  Procedure Laterality Date  ANTERIOR CERVICAL DECOMPRESSION/DISCECTOMY FUSION 4 LEVELS N/A 11/28/2019   Procedure: ANTERIOR CERVICAL DECOMPRESSION/DISCECTOMY FUSION 4 LEVELS C3-7;  Surgeon: Venetia Night, MD;  Location: [facility] ORS;  Service: Neurosurgery;  Laterality: N/A;   AUGMENTATION MAMMAPLASTY Bilateral 2009   BACK SURGERY  11/04/2014   fusion of spine of lumbosacral region 2 rods and 16 screws    BREAST ENHANCEMENT SURGERY     COLONOSCOPY WITH PROPOFOL N/A 01/06/2018   Procedure: COLONOSCOPY WITH PROPOFOL;  Surgeon: Pasty Spillers, MD;  Location: [facility] ENDOSCOPY;  Service: Endoscopy;  Laterality: N/A;   COLONOSCOPY WITH PROPOFOL N/A 03/19/2019   Procedure: COLONOSCOPY WITH PROPOFOL;  Surgeon: Pasty Spillers, MD;  Location: [facility] ENDOSCOPY;  Service: Endoscopy;  Laterality: N/A;   COSMETIC SURGERY     excess skin removal     JOINT REPLACEMENT  09/20/2008   both knees done right was last year and left just done this   KNEE ARTHROSCOPY Bilateral    x 7 knees b/l (2 surgeries on right knee) total 1 bone graft and 5 arthroscopy total knee    ROUX-EN-Y PROCEDURE     SPINE SURGERY  11/07/2014   2 rods and 16 screws   TOTAL KNEE ARTHROPLASTY     left 09/21/18 Dr. Dema Severin II [place]   TOTAL KNEE ARTHROPLASTY     right knee Dr. Elliot Gurney     Family History  Adopted: Yes  Problem Relation Age of Onset   Arthritis Mother    Drug abuse Mother        heriod OD   Early death Mother    Diabetes Father    Hypertension Father    Depression Father    Arthritis Father    Drug abuse Father    Diabetes Sister    Fibromyalgia Sister    Rashes / Skin problems Sister        PSORIASIS   Diabetes Brother    Osteoarthritis Maternal Grandmother    Aneurysm Maternal Aunt    Thyroid disease Maternal Aunt    Fibromyalgia Maternal Aunt    Breast cancer Maternal Aunt    Cancer Maternal Aunt        breast cancer     Social History   Socioeconomic History   Marital status: Married    Spouse name: alex   Number of children: 0   Years of education: 16   Highest education level: Bachelor's degree (e.g., BA, AB, BS)  Occupational History   Not on file  Tobacco Use   Smoking status: Never   Smokeless tobacco: Never   Tobacco comments:    Never smoked  Vaping Use   Vaping status: Never Used  Substance and Sexual Activity   Alcohol use: Not Currently    Comment: SOCIALLY. BUT JUST STOP.   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/2018    BA degree    No guns, wears seat belt, safe in relationship    Social Determinants of Health   Financial Resource Strain: Medium Risk (11/01/2022)   Overall Financial Resource Strain (CARDIA)    Difficulty of Paying Living Expenses: Somewhat hard  Food Insecurity: No Food Insecurity (  11/01/2022)   Hunger Vital Sign    Worried About Running Out of Food in the Last Year: Never true    Ran Out of Food in the Last Year: Never true  Transportation Needs: No Transportation Needs (11/01/2022)   PRAPARE - Administrator, Civil Service (Medical): No    Lack of Transportation (Non-Medical): No  Physical Activity: Unknown (11/01/2022)   Exercise Vital Sign    Days of Exercise per Week: Patient declined    Minutes of Exercise per Session: 30 min  Recent Concern: Physical Activity - Insufficiently Active (08/18/2022)   Exercise Vital Sign    Days of Exercise per Week: 1 day    Minutes of Exercise per Session: 30 min  Stress: Stress Concern Present (11/01/2022)   [employer] of Occupational Health - Occupational Stress Questionnaire    Feeling of Stress : To some extent  Social Connections: Moderately Isolated (11/01/2022)   Social Connection and Isolation Panel [NHANES]    Frequency of Communication with Friends and Family: More than three times a week    Frequency of Social Gatherings with Friends and Family: Twice a week    Attends Religious Services: Never    Database administrator or Organizations: No    Attends Banker Meetings: Never    Marital Status: Married  Catering manager Violence: Not At Risk (08/18/2022)   Humiliation, Afraid, Rape, and Kick questionnaire    Fear of Current or Ex-Partner: No    Emotionally Abused: No    Physically Abused: No    Sexually Abused: No     Outpatient Medications Prior to Visit  Medication Sig Dispense Refill   cyclobenzaprine (FLEXERIL) 10 MG tablet Take 1 tablet (10 mg total) by mouth 3  (three) times daily as needed for muscle spasms. This can make you sleepy. 60 tablet 0   DULoxetine (CYMBALTA) 20 MG capsule Take 40 mg by mouth daily.     fluticasone (FLONASE) 50 MCG/ACT nasal spray Place 2 sprays into both nostrils daily. 16 g 6   metoprolol succinate (TOPROL XL) 25 MG 24 hr tablet Take 1 tablet (25 mg total) by mouth daily. 30 tablet 3   pregabalin (LYRICA) 50 MG capsule TAKE ONE CAPSULE BY MOUTH TWICE A DAY AND TAKE ONE CAPSULE BY MOUTH AT BEDTIME 90 capsule 0   VITAMIN D-VITAMIN K PO Take by mouth.     predniSONE (DELTASONE) 50 MG tablet Take 1 tablet (50 mg total) by mouth See admin instructions. Prednisone - take 50 mg on the day prior to Venofer, 1 hour before Venofer. (Patient not taking: Reported on 02/11/2023) 30 tablet 0   No facility-administered medications prior to visit.    Allergies  Allergen Reactions   Morphine Nausea And Vomiting   Venofer [Iron Sucrose] Other (See Comments)    12/28/22- Throat tightness and coughing. Patient sent to ED. Steroid pre-meds will be given prior to future infusions. See progress note from 12/28/2022.    ROS Review of Systems  Genitourinary:  Positive for dysuria. Negative for frequency and urgency.   Negative unless indicated in HPI.    Objective:    Physical Exam Constitutional:      Appearance: Normal appearance.  Cardiovascular:     Rate and Rhythm: Normal rate and regular rhythm.     Pulses: Normal pulses.     Heart sounds: Normal heart sounds.  Abdominal:     General: Bowel sounds are normal.     Palpations: Abdomen is  soft.     Tenderness: There is no abdominal tenderness. There is no right CVA tenderness or left CVA tenderness.  Musculoskeletal:     Cervical back: Normal range of motion.  Neurological:     General: No focal deficit present.     Mental Status: She is alert. Mental status is at baseline.  Psychiatric:        Mood and Affect: Mood normal.        Behavior: Behavior normal.        Thought  Content: Thought content normal.        Judgment: Judgment normal.     BP 134/86   Pulse 76   Temp 98.1 F (36.7 C)   Resp 16   Ht 5\' 2"  (1.575 m)   Wt 161 lb 6 oz (73.2 kg)   SpO2 99%   BMI 29.52 kg/m  Wt Readings from Last 3 Encounters:  02/11/23 161 lb 6 oz (73.2 kg)  02/01/23 164 lb (74.4 kg)  12/30/22 157 lb (71.2 kg)     Health Maintenance  Topic Date Due   Zoster Vaccines- Shingrix (1 of 2) Never done   COVID-19 Vaccine (3 - Pfizer risk series) 02/09/2020   PAP SMEAR-Modifier  02/11/2022   INFLUENZA VACCINE  08/29/2023 (Originally 12/30/2022)   MAMMOGRAM  04/21/2023   Medicare Annual Wellness (AWV)  08/18/2023   Colonoscopy  03/18/2024   DTaP/Tdap/Td (3 - Td or Tdap) 02/02/2027   Hepatitis C Screening  Completed   HIV Screening  Completed   HPV VACCINES  Aged Out    There are no preventive care reminders to display for this patient.  Lab Results  Component Value Date   TSH 3.135 02/09/2022   Lab Results  Component Value Date   WBC 6.3 02/01/2023   HGB 11.8 02/01/2023   HCT 38.4 02/01/2023   MCV 86 02/01/2023   PLT 360 02/01/2023   Lab Results  Component Value Date   NA 142 02/01/2023   K 4.6 02/01/2023   CO2 24 02/01/2023   GLUCOSE 74 02/01/2023   BUN 19 02/01/2023   CREATININE 0.88 02/01/2023   BILITOT <0.2 02/01/2023   ALKPHOS 129 (H) 02/01/2023   AST 55 (H) 02/01/2023   ALT 53 (H) 02/01/2023   PROT 6.3 02/01/2023   ALBUMIN 4.3 02/01/2023   CALCIUM 8.9 02/01/2023   ANIONGAP 8 02/10/2022   EGFR 77 02/01/2023   GFR 68.19 10/22/2022   Lab Results  Component Value Date   CHOL 184 02/15/2019   Lab Results  Component Value Date   HDL 117 02/15/2019   Lab Results  Component Value Date   LDLCALC 56 02/15/2019   Lab Results  Component Value Date   TRIG 55 02/15/2019   Lab Results  Component Value Date   CHOLHDL 1.6 02/15/2019   Lab Results  Component Value Date   HGBA1C 5.2 02/13/2018      Assessment & Plan:   Dysuria Assessment & Plan: POCT urinalysis positive for leukocytes and nitrite. Urine culture pending.  Will treat with cefdinir. Advised patient to increase fluid intake. Advised to take over-the-counter Azo for pain.   Orders: -     POCT Urinalysis Dipstick (Automated) -     Urine Culture  Bad odor of urine -     POCT Urinalysis Dipstick (Automated) -     Urine Culture  Other orders -     Cefdinir; Take 1 capsule (300 mg total) by mouth 2 (two) times daily  for 7 days.  Dispense: 14 capsule; Refill: 0        Handicap placard paperwork provided.  Education provided for symptoms management on AVS either electronically or printed.    Follow-up: Return if symptoms worsen or fail to improve.   Kara Dies, NP

## 2023-02-13 ENCOUNTER — Encounter: Payer: Self-pay | Admitting: Nurse Practitioner

## 2023-02-13 DIAGNOSIS — R300 Dysuria: Secondary | ICD-10-CM | POA: Insufficient documentation

## 2023-02-13 NOTE — Patient Instructions (Signed)
Rx sent to the pharmacy. Increase fluid intake

## 2023-02-13 NOTE — Assessment & Plan Note (Signed)
POCT urinalysis positive for leukocytes and nitrite. Urine culture pending.  Will treat with cefdinir. Advised patient to increase fluid intake. Advised to take over-the-counter Azo for pain.

## 2023-02-14 ENCOUNTER — Encounter: Payer: Self-pay | Admitting: Physician Assistant

## 2023-02-14 ENCOUNTER — Other Ambulatory Visit: Payer: Self-pay | Admitting: Physician Assistant

## 2023-02-14 DIAGNOSIS — K5904 Chronic idiopathic constipation: Secondary | ICD-10-CM

## 2023-02-14 LAB — URINE CULTURE
MICRO NUMBER:: 15464053
SPECIMEN QUALITY:: ADEQUATE

## 2023-02-14 MED ORDER — LINACLOTIDE 145 MCG PO CAPS
145.0000 ug | ORAL_CAPSULE | Freq: Every day | ORAL | 5 refills | Status: DC
Start: 2023-02-14 — End: 2023-03-24

## 2023-02-15 ENCOUNTER — Ambulatory Visit: Payer: Medicare HMO | Admitting: Physician Assistant

## 2023-02-17 ENCOUNTER — Other Ambulatory Visit: Payer: Self-pay

## 2023-02-17 ENCOUNTER — Encounter: Payer: Self-pay | Admitting: Gastroenterology

## 2023-02-18 ENCOUNTER — Encounter: Payer: Self-pay | Admitting: Gastroenterology

## 2023-02-18 NOTE — Anesthesia Preprocedure Evaluation (Addendum)
Anesthesia Evaluation  Patient identified by MRN, date of birth, ID band Patient awake    Reviewed: Allergy & Precautions, H&P , NPO status , Patient's Chart, lab work & pertinent test results, reviewed documented beta blocker date and time   Airway Mallampati: III  TM Distance: >3 FB Neck ROM: Full    Dental  (+) Partial Lower, Caps Patient removed her lower partial, upper teeth mostly caps/crowns:   Pulmonary neg pulmonary ROS   Pulmonary exam normal breath sounds clear to auscultation       Cardiovascular hypertension, Pt. on home beta blockers and Pt. on medications Normal cardiovascular exam+ dysrhythmias Atrial Fibrillation and Supra Ventricular Tachycardia + Valvular Problems/Murmurs  Rhythm:Regular Rate:Normal  Atrial fibrillation PSVT Possible atrial tachycardia with variable block Heart murmur Grade I diastolic dysfunction Mild MR Mild TR  03-11-22 echo  1. Left ventricular ejection fraction, by estimation, is 60 to 65%. The  left ventricle has normal function. The left ventricle has no regional  wall motion abnormalities. Left ventricular diastolic parameters are  consistent with Grade I diastolic  dysfunction (impaired relaxation).   2. Right ventricular systolic function is normal. The right ventricular  size is normal.   3. The mitral valve is normal in structure. Mild mitral valve  regurgitation. No evidence of mitral stenosis.   4. The aortic valve is normal in structure. Aortic valve regurgitation is  not visualized. No aortic stenosis is present.   5. The inferior vena cava is normal in size with greater than 50%  respiratory variability, suggesting right atrial pressure of 3 mmHg.     03-08-22 to 03-09-22 Patch Wear Time:  13 days and 22 hours (2023-09-19T09:52:24-0400 to 2023-10-03T08:36:29-0400)   Patient had a min HR of 62 bpm, max HR of 240 bpm, and avg HR of 89 bpm. Predominant underlying rhythm  was Sinus Rhythm.  1 run of Ventricular Tachycardia occurred lasting 5 beats with a max rate of 231 bpm (avg 206 bpm).  144 Supraventricular Tachycardia  runs occurred, the run with the fastest interval lasting 5 beats with a max rate of 240 bpm, the longest lasting 10.8 secs with an avg rate of 168 bpm. Some episodes of Supraventricular Tachycardia may be possible Atrial Tachycardia with variable block.  Supraventricular Tachycardia was detected within +/- 45 seconds of symptomatic patient event(s). Isolated SVEs were occasional (2.6%, 46546), SVE Couplets were rare (<1.0%, 1297), and SVE Triplets were rare (<1.0%, 338). Isolated VEs were rare (<1.0%),  VE Couplets were rare (<1.0%), and no VE Triplets were present.   Conclusion Paroxysmal SVTs, occasional PACs noted     Neuro/Psych  Headaches PSYCHIATRIC DISORDERS Anxiety Depression    PTSD Anxiety, depression Neuromuscular disease  negative psych ROS   GI/Hepatic negative GI ROS, Neg liver ROS,,,Note hx gastric bypass   Endo/Other  negative endocrine ROS    Renal/GU Renal disease  negative genitourinary   Musculoskeletal  (+) Arthritis ,    Abdominal   Peds negative pediatric ROS (+)  Hematology  (+) Blood dyscrasia, anemia   Anesthesia Other Findings Medical History  Hypercholesterolemia  Early menopause Gastric bypass status for obesity Chronic pain syndrome Scoliosis of lumbar spine  Neuropathy Degeneration of lumbar intervertebral disc  Rheumatoid arthritis (HCC) History of chicken pox  Headache Iron deficiency  Congenital absence of right kidney Solitary kidney, congenital  Vitamin D deficiency B12 deficiency UTI (urinary tract infection) Thyroid disease  Depression Anxiety  Heart murmur Atrial fibrillation (HCC)  Colon polyps Abnormal Pap smear of cervix  Iron deficiency anemia Gallstones  Abnormal MRI, cervical spine Altered mental status  Annual physical exam Anxiety  Cardiac murmur Chronic  knee pain (Bilateral)  Elevated alkaline phosphatase level HPV in female  Benign neoplasm of ascending colon Polyp of sigmoid colon  Confusion Personal history of colonic polyps  Pharmacologic therapy Problems influencing health status  Anxiety and depression Altered mental status  Alcohol use Thrombocytosis  Atrophy of right kidney Brain cyst  Neuromuscular disorder (HCC) SIADH (syndrome of inappropriate ADH production) (HCC)  Transaminitis Elevated BP without diagnosis of hypertension  Wears partial denture--removed  it today  Very nice lady, very anxious, attempted to allay her fears, discuss w/her and will plan versed for anxiolysis   Reproductive/Obstetrics negative OB ROS                             Anesthesia Physical Anesthesia Plan  ASA: 3  Anesthesia Plan: General   Post-op Pain Management:    Induction: Intravenous  PONV Risk Score and Plan:   Airway Management Planned: Natural Airway and Nasal Cannula  Additional Equipment:   Intra-op Plan:   Post-operative Plan:   Informed Consent: I have reviewed the patients History and Physical, chart, labs and discussed the procedure including the risks, benefits and alternatives for the proposed anesthesia with the patient or authorized representative who has indicated his/her understanding and acceptance.     Dental Advisory Given  Plan Discussed with: Anesthesiologist, CRNA and Surgeon  Anesthesia Plan Comments: (Patient consented for risks of anesthesia including but not limited to:  - adverse reactions to medications - risk of airway placement if required - damage to eyes, teeth, lips or other oral mucosa - nerve damage due to positioning  - sore throat or hoarseness - Damage to heart, brain, nerves, lungs, other parts of body or loss of life  Patient voiced understanding.)        Anesthesia Quick Evaluation

## 2023-02-21 ENCOUNTER — Encounter: Payer: Self-pay | Admitting: Oncology

## 2023-02-24 ENCOUNTER — Encounter
Admission: RE | Disposition: A | Discharge status: Home / Self Care | Payer: Self-pay | Source: Ambulatory Visit | Attending: Gastroenterology

## 2023-02-24 ENCOUNTER — Other Ambulatory Visit: Payer: Self-pay

## 2023-02-24 ENCOUNTER — Encounter: Payer: Self-pay | Admitting: Gastroenterology

## 2023-02-24 ENCOUNTER — Ambulatory Visit: Payer: Medicare HMO | Admitting: Anesthesiology

## 2023-02-24 ENCOUNTER — Ambulatory Visit: Payer: Self-pay | Admitting: Anesthesiology

## 2023-02-24 ENCOUNTER — Ambulatory Visit
Admission: RE | Admit: 2023-02-24 | Discharge: 2023-02-24 | Disposition: A | Discharge status: Home / Self Care | Payer: Medicare HMO | Source: Ambulatory Visit | Attending: Gastroenterology | Admitting: Gastroenterology

## 2023-02-24 DIAGNOSIS — E7800 Pure hypercholesterolemia, unspecified: Secondary | ICD-10-CM | POA: Diagnosis not present

## 2023-02-24 DIAGNOSIS — F32A Depression, unspecified: Secondary | ICD-10-CM | POA: Insufficient documentation

## 2023-02-24 DIAGNOSIS — D509 Iron deficiency anemia, unspecified: Secondary | ICD-10-CM | POA: Insufficient documentation

## 2023-02-24 DIAGNOSIS — E538 Deficiency of other specified B group vitamins: Secondary | ICD-10-CM | POA: Insufficient documentation

## 2023-02-24 DIAGNOSIS — Z980 Intestinal bypass and anastomosis status: Secondary | ICD-10-CM | POA: Insufficient documentation

## 2023-02-24 DIAGNOSIS — I340 Nonrheumatic mitral (valve) insufficiency: Secondary | ICD-10-CM | POA: Diagnosis not present

## 2023-02-24 DIAGNOSIS — Z9884 Bariatric surgery status: Secondary | ICD-10-CM | POA: Diagnosis not present

## 2023-02-24 DIAGNOSIS — M069 Rheumatoid arthritis, unspecified: Secondary | ICD-10-CM | POA: Insufficient documentation

## 2023-02-24 DIAGNOSIS — E222 Syndrome of inappropriate secretion of antidiuretic hormone: Secondary | ICD-10-CM | POA: Diagnosis not present

## 2023-02-24 DIAGNOSIS — I4891 Unspecified atrial fibrillation: Secondary | ICD-10-CM | POA: Insufficient documentation

## 2023-02-24 DIAGNOSIS — G894 Chronic pain syndrome: Secondary | ICD-10-CM | POA: Insufficient documentation

## 2023-02-24 DIAGNOSIS — F4310 Post-traumatic stress disorder, unspecified: Secondary | ICD-10-CM | POA: Insufficient documentation

## 2023-02-24 DIAGNOSIS — F419 Anxiety disorder, unspecified: Secondary | ICD-10-CM | POA: Insufficient documentation

## 2023-02-24 DIAGNOSIS — Z1211 Encounter for screening for malignant neoplasm of colon: Secondary | ICD-10-CM | POA: Diagnosis not present

## 2023-02-24 DIAGNOSIS — K635 Polyp of colon: Secondary | ICD-10-CM

## 2023-02-24 DIAGNOSIS — D122 Benign neoplasm of ascending colon: Secondary | ICD-10-CM | POA: Insufficient documentation

## 2023-02-24 DIAGNOSIS — K621 Rectal polyp: Secondary | ICD-10-CM | POA: Diagnosis not present

## 2023-02-24 DIAGNOSIS — D75839 Thrombocytosis, unspecified: Secondary | ICD-10-CM | POA: Insufficient documentation

## 2023-02-24 DIAGNOSIS — I471 Supraventricular tachycardia, unspecified: Secondary | ICD-10-CM | POA: Diagnosis not present

## 2023-02-24 DIAGNOSIS — E079 Disorder of thyroid, unspecified: Secondary | ICD-10-CM | POA: Insufficient documentation

## 2023-02-24 DIAGNOSIS — Z86010 Personal history of colonic polyps: Secondary | ICD-10-CM | POA: Insufficient documentation

## 2023-02-24 DIAGNOSIS — Q600 Renal agenesis, unilateral: Secondary | ICD-10-CM | POA: Diagnosis not present

## 2023-02-24 DIAGNOSIS — R519 Headache, unspecified: Secondary | ICD-10-CM | POA: Diagnosis not present

## 2023-02-24 DIAGNOSIS — I10 Essential (primary) hypertension: Secondary | ICD-10-CM | POA: Diagnosis not present

## 2023-02-24 HISTORY — DX: Supraventricular tachycardia, unspecified: I47.10

## 2023-02-24 HISTORY — DX: Rheumatic tricuspid insufficiency: I07.1

## 2023-02-24 HISTORY — DX: Nonrheumatic mitral (valve) insufficiency: I34.0

## 2023-02-24 HISTORY — DX: Other ill-defined heart diseases: I51.89

## 2023-02-24 HISTORY — DX: Post-traumatic stress disorder, unspecified: F43.10

## 2023-02-24 HISTORY — PX: ESOPHAGOGASTRODUODENOSCOPY (EGD) WITH PROPOFOL: SHX5813

## 2023-02-24 HISTORY — DX: Presence of dental prosthetic device (complete) (partial): Z97.2

## 2023-02-24 HISTORY — PX: COLONOSCOPY WITH PROPOFOL: SHX5780

## 2023-02-24 HISTORY — PX: POLYPECTOMY: SHX5525

## 2023-02-24 SURGERY — COLONOSCOPY WITH PROPOFOL
Anesthesia: General | Site: Rectum

## 2023-02-24 MED ORDER — LACTATED RINGERS IV SOLN: INTRAVENOUS | Status: DC

## 2023-02-24 MED ORDER — MIDAZOLAM HCL 2 MG/2ML IJ SOLN
INTRAMUSCULAR | Status: AC
  Filled 2023-02-24: qty 2

## 2023-02-24 MED ORDER — MIDAZOLAM HCL 5 MG/5ML IJ SOLN
INTRAMUSCULAR | Status: DC | PRN
Start: 2023-02-24 — End: 2023-02-24
  Administered 2023-02-24: 2 mg via INTRAVENOUS

## 2023-02-24 MED ORDER — PROPOFOL 10 MG/ML IV BOLUS
INTRAVENOUS | Status: DC | PRN
  Administered 2023-02-24 (×4): 40 mg via INTRAVENOUS
  Administered 2023-02-24: 20 mg via INTRAVENOUS
  Administered 2023-02-24: 80 mg via INTRAVENOUS
  Administered 2023-02-24 (×2): 40 mg via INTRAVENOUS

## 2023-02-24 MED ORDER — SODIUM CHLORIDE 0.9 % IV SOLN: INTRAVENOUS | Status: DC

## 2023-02-24 MED ORDER — STERILE WATER FOR IRRIGATION IR SOLN
Status: DC | PRN
  Administered 2023-02-24: 60 mL

## 2023-02-24 MED ORDER — LIDOCAINE HCL (CARDIAC) PF 100 MG/5ML IV SOSY
PREFILLED_SYRINGE | INTRAVENOUS | Status: DC | PRN
  Administered 2023-02-24: 50 mg via INTRAVENOUS

## 2023-02-24 MED ORDER — PROPOFOL 10 MG/ML IV BOLUS
INTRAVENOUS | Status: AC
  Filled 2023-02-24: qty 40

## 2023-02-24 MED ORDER — PHENYLEPHRINE HCL (PRESSORS) 10 MG/ML IV SOLN
INTRAVENOUS | Status: DC | PRN
Start: 2023-02-24 — End: 2023-02-24
  Administered 2023-02-24 (×2): 80 ug via INTRAVENOUS

## 2023-02-24 MED ORDER — ONDANSETRON HCL 4 MG/2ML IJ SOLN
INTRAMUSCULAR | Status: DC | PRN
Start: 2023-02-24 — End: 2023-02-24
  Administered 2023-02-24: 4 mg via INTRAVENOUS

## 2023-02-24 MED ORDER — ONDANSETRON HCL 4 MG/2ML IJ SOLN
INTRAMUSCULAR | Status: AC
  Filled 2023-02-24: qty 2

## 2023-02-24 MED ORDER — PHENYLEPHRINE 80 MCG/ML (10ML) SYRINGE FOR IV PUSH (FOR BLOOD PRESSURE SUPPORT)
PREFILLED_SYRINGE | INTRAVENOUS | Status: AC
  Filled 2023-02-24: qty 10

## 2023-02-24 MED ORDER — STERILE WATER FOR IRRIGATION IR SOLN
Status: DC | PRN
  Administered 2023-02-24: 1

## 2023-02-24 SURGICAL SUPPLY — 36 items
BALLN DILATOR 12-15 8 (BALLOONS)
BALLN DILATOR 15-18 8 (BALLOONS)
BALLN DILATOR CRE 0-12 8 (BALLOONS)
BALLN DILATOR ESOPH 8 10 CRE (MISCELLANEOUS) IMPLANT
BALLOON DILATOR 12-15 8 (BALLOONS) IMPLANT
BALLOON DILATOR 15-18 8 (BALLOONS) IMPLANT
BALLOON DILATOR CRE 0-12 8 (BALLOONS) IMPLANT
BLOCK BITE 60FR ADLT L/F GRN (MISCELLANEOUS) ×3 IMPLANT
CLIP HMST 235XBRD CATH ROT (MISCELLANEOUS) IMPLANT
CLIP RESOLUTION 360 11X235 (MISCELLANEOUS)
ELECT REM PT RETURN 9FT ADLT (ELECTROSURGICAL)
ELECTRODE REM PT RTRN 9FT ADLT (ELECTROSURGICAL) IMPLANT
FCP ESCP3.2XJMB 240X2.8X (MISCELLANEOUS)
FORCEPS BIOP RAD 4 LRG CAP 4 (CUTTING FORCEPS) IMPLANT
FORCEPS BIOP RJ4 240 W/NDL (MISCELLANEOUS)
FORCEPS ESCP3.2XJMB 240X2.8X (MISCELLANEOUS) IMPLANT
GOWN CVR UNV OPN BCK APRN NK (MISCELLANEOUS) ×6 IMPLANT
GOWN ISOL THUMB LOOP REG UNIV (MISCELLANEOUS) ×6
INJECTOR VARIJECT VIN23 (MISCELLANEOUS) IMPLANT
KIT DEFENDO VALVE AND CONN (KITS) IMPLANT
KIT PRC NS LF DISP ENDO (KITS) ×3 IMPLANT
KIT PROCEDURE OLYMPUS (KITS) ×3
MANIFOLD NEPTUNE II (INSTRUMENTS) ×3 IMPLANT
MARKER SPOT ENDO TATTOO 5ML (MISCELLANEOUS) IMPLANT
PROBE APC STR FIRE (PROBE) IMPLANT
RETRIEVER NET PLAT FOOD (MISCELLANEOUS) IMPLANT
RETRIEVER NET ROTH 2.5X230 LF (MISCELLANEOUS) IMPLANT
SNARE COLD EXACTO (MISCELLANEOUS) IMPLANT
SNARE SHORT THROW 13M SML OVAL (MISCELLANEOUS) IMPLANT
SNARE SHORT THROW 30M LRG OVAL (MISCELLANEOUS) IMPLANT
SNARE SNG USE RND 15MM (INSTRUMENTS) IMPLANT
SYR INFLATION 60ML (SYRINGE) IMPLANT
TRAP ETRAP POLY (MISCELLANEOUS) IMPLANT
VARIJECT INJECTOR VIN23 (MISCELLANEOUS)
WATER STERILE IRR 250ML POUR (IV SOLUTION) ×3 IMPLANT
WIRE CRE 18-20MM 8CM F G (MISCELLANEOUS) IMPLANT

## 2023-02-24 NOTE — Op Note (Signed)
[facility] Patient Name: Shelby Colon Procedure Date: 02/24/2023 9:22 AM MRN: [id number] Account #: [id number] Date of Birth: 05-07-1966 Admit Type: Outpatient Age: 57 Room: [facility] OR ROOM 01 Gender: Female Note Status: Finalized Instrument Name: [id number] Procedure:             Colonoscopy Indications:           Surveillance: Personal history of adenomatous polyps                         on last colonoscopy > 3 years ago, Last colonoscopy:                         October 2020 Providers:             Toney Reil MD, MD Referring MD:          Dana Allan (Referring MD) Medicines:             General Anesthesia Complications:         No immediate complications. Estimated blood loss: None. Procedure:             Pre-Anesthesia Assessment:                        - Prior to the procedure, a History and Physical was                         performed, and patient medications and allergies were                         reviewed. The patient is competent. The risks and                         benefits of the procedure and the sedation options and                         risks were discussed with the patient. All questions                         were answered and informed consent was obtained.                         Patient identification and proposed procedure were                         verified by the physician, the nurse, the                         anesthesiologist, the anesthetist and the technician                         in the pre-procedure area in the procedure room in the                         endoscopy suite. Mental Status Examination: alert and                         oriented. Airway Examination: normal oropharyngeal  airway and neck mobility. Respiratory Examination:                         clear to auscultation. CV Examination: normal.                         Prophylactic Antibiotics: The patient does not  require                         prophylactic antibiotics. Prior Anticoagulants: The                         patient has taken no anticoagulant or antiplatelet                         agents. ASA Grade Assessment: III - A patient with                         severe systemic disease. After reviewing the risks and                         benefits, the patient was deemed in satisfactory                         condition to undergo the procedure. The anesthesia                         plan was to use general anesthesia. Immediately prior                         to administration of medications, the patient was                         re-assessed for adequacy to receive sedatives. The                         heart rate, respiratory rate, oxygen saturations,                         blood pressure, adequacy of pulmonary ventilation, and                         response to care were monitored throughout the                         procedure. The physical status of the patient was                         re-assessed after the procedure.                        After obtaining informed consent, the colonoscope was                         passed under direct vision. Throughout the procedure,                         the patient's blood pressure, pulse, and oxygen  saturations were monitored continuously. The                         Colonoscope was introduced through the anus and                         advanced to the the terminal ileum, with                         identification of the appendiceal orifice and IC                         valve. The colonoscopy was performed without                         difficulty. The patient tolerated the procedure well.                         The quality of the bowel preparation was evaluated                         using the BBPS [facility] Bowel Preparation Scale) with                         scores of: Right Colon = 3, Transverse Colon = 3  and                         Left Colon = 3 (entire mucosa seen well with no                         residual staining, small fragments of stool or opaque                         liquid). The total BBPS score equals 9. The terminal                         ileum, ileocecal valve, appendiceal orifice, and                         rectum were photographed. Findings:      The perianal and digital rectal examinations were normal. Pertinent       negatives include normal sphincter tone and no palpable rectal lesions.      The terminal ileum appeared normal.      A 3 mm polyp was found in the proximal ascending colon. The polyp was       sessile. The polyp was removed with a cold snare. Resection and       retrieval were complete. Estimated blood loss: none.      The retroflexed view of the distal rectum and anal verge was normal and       showed no anal or rectal abnormalities. Impression:            - The examined portion of the ileum was normal.                        - One 3 mm polyp in the proximal ascending colon,  removed with a cold snare. Resected and retrieved.                        - The distal rectum and anal verge are normal on                         retroflexion view. Recommendation:        - Discharge patient to home (with escort).                        - Resume previous diet today.                        - Continue present medications.                        - Await pathology results.                        - Repeat colonoscopy in 7-10 years for surveillance                         based on pathology results. Procedure Code(s):     --- Professional ---                        [phone], Colonoscopy, flexible; with removal of                         tumor(s), polyp(s), or other lesion(s) by snare                         technique Diagnosis Code(s):     --- Professional ---                        Z86.010, Personal history of colonic polyps                         D12.2, Benign neoplasm of ascending colon CPT copyright 2022 American Medical Association. All rights reserved. The codes documented in this report are preliminary and upon coder review may  be revised to meet current compliance requirements. Dr. Libby Maw Toney Reil MD, MD 02/24/2023 10:02:11 AM This report has been signed electronically. Number of Addenda: 0 Note Initiated On: 02/24/2023 9:22 AM Scope Withdrawal Time: 0 hours 11 minutes 40 seconds  Total Procedure Duration: 0 hours 15 minutes 43 seconds  Estimated Blood Loss:  Estimated blood loss: none.      [facility]

## 2023-02-24 NOTE — H&P (Addendum)
Arlyss Repress, MD [address]  [address]  [place], [place] [address]  Main: [phone]  Fax: [phone] Pager: [phone]  Primary Care Physician:  Dana Allan, MD Primary Gastroenterologist:  Dr. Arlyss Repress  Pre-Procedure History & Physical: HPI:  Nyana Zellman is a 57 y.o. female is here for an EGD and a colonoscopy.   Past Medical History:  Diagnosis Date   Abnormal MRI, cervical spine 10/17/2019   FINDINGS: Alignment: Reversal of the normal cervical lordosis with apex at C3-4. Trace retrolisthesis of C4 on C5, C5 on C6, and C6 on C7, with anterolisthesis of C7 on T1. Findings chronic and facet mediated.   Vertebrae: Vertebral body height maintained without evidence for acute or chronic fracture. Bone marrow signal intensity within normal limits. No discrete or worrisome osseous lesions. Pro   Abnormal Pap smear of cervix    h/o LSIL pap with HPV +; 01/2019 pap negative negative HPV   Alcohol use 02/09/2022   Altered mental status 12/08/2021   Altered mental status 12/08/2021   Annual physical exam 06/12/2020   Anxiety    Anxiety 06/12/2020   Anxiety and depression 11/30/2017   Atrial fibrillation (HCC)    Atrophy of right kidney 03/05/2019   Left kidney ok   B12 deficiency    Benign neoplasm of ascending colon    Brain cyst 12/10/2021   Cardiac murmur 02/14/2018   as a child   Chronic knee pain (Bilateral) 09/07/2018   09/21/2018 left total knee ortho [place] [facility] OP    Chronic pain syndrome    Colon polyps    Confusion 12/10/2021   Congenital absence of right kidney    Degeneration of lumbar intervertebral disc    Back surgery, left leg knee down numb and tingling   Depression    Early menopause    Elevated alkaline phosphatase level 02/23/2019   Elevated BP without diagnosis of hypertension 08/04/2021   Last Assessment & Plan: Formatting of this note might be different from the original. BP at last visit was 131/85 and  today is 156/88.  Advised patient to come back within the next 2 to 4 weeks to recheck her blood pressure.  Goal BP is less than 140/90.   Gallstones    Gastric bypass status for obesity    2013, weight 258lb   Grade I diastolic dysfunction    Headache    History of   Heart murmur    MVP   History of chicken pox    HPV in female 11/30/2017   Hypercholesterolemia    Iron deficiency    Iron deficiency anemia 03/02/2019   Mild mitral regurgitation by prior echocardiogram    Mild tricuspid regurgitation by prior echocardiogram    Neuromuscular disorder (HCC)    Neuropathy    Paroxysmal SVT (supraventricular tachycardia)    Personal history of colonic polyps    Pharmacologic therapy 10/17/2019   Polyp of sigmoid colon    Problems influencing health status 10/17/2019   PTSD (post-traumatic stress disorder)    Rheumatoid arthritis (HCC)    as child    Scoliosis of lumbar spine    SIADH (syndrome of inappropriate ADH production) (HCC) 01/01/2022   Solitary kidney, congenital    pt thinks has left kidney only no right kidney   Thrombocytosis 02/23/2019   Thyroid disease    in the past    Transaminitis 12/08/2021   UTI (urinary tract infection)    Vitamin D deficiency  Wears partial dentures    bottom    Past Surgical History:  Procedure Laterality Date   ANTERIOR CERVICAL DECOMPRESSION/DISCECTOMY FUSION 4 LEVELS N/A 11/28/2019   Procedure: ANTERIOR CERVICAL DECOMPRESSION/DISCECTOMY FUSION 4 LEVELS C3-7;  Surgeon: Venetia Night, MD;  Location: [facility] ORS;  Service: Neurosurgery;  Laterality: N/A;   AUGMENTATION MAMMAPLASTY Bilateral 2009   BACK SURGERY  11/04/2014   fusion of spine of lumbosacral region 2 rods and 16 screws    BREAST ENHANCEMENT SURGERY     COLONOSCOPY WITH PROPOFOL N/A 01/06/2018   Procedure: COLONOSCOPY WITH PROPOFOL;  Surgeon: Pasty Spillers, MD;  Location: [facility] ENDOSCOPY;  Service: Endoscopy;  Laterality: N/A;   COLONOSCOPY WITH PROPOFOL N/A  03/19/2019   Procedure: COLONOSCOPY WITH PROPOFOL;  Surgeon: Pasty Spillers, MD;  Location: [facility] ENDOSCOPY;  Service: Endoscopy;  Laterality: N/A;   COSMETIC SURGERY     excess skin removal     JOINT REPLACEMENT  09/20/2008   both knees done right was last year and left just done this   KNEE ARTHROSCOPY Bilateral    x 7 knees b/l (2 surgeries on right knee) total 1 bone graft and 5 arthroscopy total knee    ROUX-EN-Y PROCEDURE     SPINE SURGERY  11/07/2014   2 rods and 16 screws   TOTAL KNEE ARTHROPLASTY     left 09/21/18 Dr. Dema Severin II Franco Collet   TOTAL KNEE ARTHROPLASTY     right knee Dr. Elliot Gurney     Prior to Admission medications   Medication Sig Start Date End Date Taking? Authorizing Provider  cyclobenzaprine (FLEXERIL) 10 MG tablet Take 1 tablet (10 mg total) by mouth 3 (three) times daily as needed for muscle spasms. This can make you sleepy. 11/29/22  Yes Drake Leach, PA-C  linaclotide [facility]) 145 MCG CAPS capsule Take 1 capsule (145 mcg total) by mouth daily before breakfast. 02/14/23 08/13/23 Yes Celso Amy, PA-C  metoprolol succinate (TOPROL XL) 25 MG 24 hr tablet Take 1 tablet (25 mg total) by mouth daily. 11/15/22  Yes Dana Allan, MD  OVER THE COUNTER MEDICATION Sleep three-to help sleep   Yes [provider]  pregabalin (LYRICA) 50 MG capsule TAKE ONE CAPSULE BY MOUTH TWICE A DAY AND TAKE ONE CAPSULE BY MOUTH AT BEDTIME 01/18/22  Yes Worthy Rancher B, FNP  VITAMIN D-VITAMIN K PO Take by mouth.   Yes [provider]  DULoxetine (CYMBALTA) 20 MG capsule Take 40 mg by mouth daily. Patient not taking: Reported on 02/17/2023 01/03/23 01/03/24  [provider]  fluticasone (FLONASE) 50 MCG/ACT nasal spray Place 2 sprays into both nostrils daily. Patient taking differently: Place 2 sprays into both nostrils as needed. 06/23/22   Dana Allan, MD  predniSONE (DELTASONE) 50 MG tablet Take 1 tablet (50 mg total) by mouth See admin  instructions. Prednisone - take 50 mg on the day prior to Venofer, 1 hour before Venofer. Patient not taking: Reported on 02/11/2023 01/03/23   Rickard Patience, MD    Allergies as of 02/01/2023 - Review Complete 02/01/2023  Allergen Reaction Noted   Morphine Nausea And Vomiting 05/14/2014   Venofer [iron sucrose] Other (See Comments) 12/29/2022    Family History  Adopted: Yes  Problem Relation Age of Onset   Arthritis Mother    Drug abuse Mother        heriod OD   Early death Mother    Diabetes Father    Hypertension Father    Depression Father  Arthritis Father    Drug abuse Father    Diabetes Sister    Fibromyalgia Sister    Rashes / Skin problems Sister        PSORIASIS   Diabetes Brother    Osteoarthritis Maternal Grandmother    Aneurysm Maternal Aunt    Thyroid disease Maternal Aunt    Fibromyalgia Maternal Aunt    Breast cancer Maternal Aunt    Cancer Maternal Aunt        breast cancer     Social History   Socioeconomic History   Marital status: Married    Spouse name: alex   Number of children: 0   Years of education: 16   Highest education level: Bachelor's degree (e.g., BA, AB, BS)  Occupational History   Not on file  Tobacco Use   Smoking status: Never   Smokeless tobacco: Never   Tobacco comments:    Never smoked  Vaping Use   Vaping status: Never Used  Substance and Sexual Activity   Alcohol use: Not Currently    Alcohol/week: 1.0 standard drink of alcohol    Types: 1 Glasses of wine per week    Comment: SOCIALLY   Drug use: Never   Sexual activity: Not Currently  Other Topics Concern   Not on file  Social History Narrative   Married since 2012    No kids    adopted   From [place] moved to [place] and now lives here    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    BA degree    No guns, wears seat belt, safe in relationship    Social Determinants of Health   Financial  Resource Strain: Medium Risk (11/01/2022)   Overall Financial Resource Strain (CARDIA)    Difficulty of Paying Living Expenses: Somewhat hard  Food Insecurity: No Food Insecurity (11/01/2022)   Hunger Vital Sign    Worried About Running Out of Food in the Last Year: Never true    Ran Out of Food in the Last Year: Never true  Transportation Needs: No Transportation Needs (11/01/2022)   PRAPARE - Administrator, Civil Service (Medical): No    Lack of Transportation (Non-Medical): No  Physical Activity: Unknown (11/01/2022)   Exercise Vital Sign    Days of Exercise per Week: Patient declined    Minutes of Exercise per Session: 30 min  Recent Concern: Physical Activity - Insufficiently Active (08/18/2022)   Exercise Vital Sign    Days of Exercise per Week: 1 day    Minutes of Exercise per Session: 30 min  Stress: Stress Concern Present (11/01/2022)   [employer] of Occupational Health - Occupational Stress Questionnaire    Feeling of Stress : To some extent  Social Connections: Moderately Isolated (11/01/2022)   Social Connection and Isolation Panel [NHANES]    Frequency of Communication with Friends and Family: More than three times a week    Frequency of Social Gatherings with Friends and Family: Twice a week    Attends Religious Services: Never    Database administrator or Organizations: No    Attends Banker Meetings: Never    Marital Status: Married  Catering manager Violence: Not At Risk (08/18/2022)   Humiliation, Afraid, Rape, and Kick questionnaire    Fear of Current or Ex-Partner: No    Emotionally Abused: No    Physically Abused: No    Sexually Abused: No  Review of Systems: See HPI, otherwise negative ROS  Physical Exam: BP 133/85   Temp (!) 97.5 F (36.4 C) (Temporal)   Resp (!) 8   Ht 5' 2.01" (1.575 m)   Wt 71.7 kg   SpO2 100%   BMI 28.89 kg/m  General:   Alert,  pleasant and cooperative in NAD Head:  Normocephalic and  atraumatic. Neck:  Supple; no masses or thyromegaly. Lungs:  Clear throughout to auscultation.    Heart:  Regular rate and rhythm. Abdomen:  Soft, nontender and nondistended. Normal bowel sounds, without guarding, and without rebound.   Neurologic:  Alert and  oriented x4;  grossly normal neurologically.  Impression/Plan: Dhanya Donk is here for an EGD and a colonoscopy to be performed for h/o colon adenoma, IDA  Risks, benefits, limitations, and alternatives regarding EGD and  colonoscopy have been reviewed with the patient.  Questions have been answered.  All parties agreeable.   Lannette Donath, MD  02/24/2023, 9:23 AM

## 2023-02-24 NOTE — Op Note (Signed)
[facility] Patient Name: Shelby Colon Procedure Date: 02/24/2023 9:24 AM MRN: [id number] Account #: [id number] Date of Birth: [birth year] Admit Type: Outpatient Age: 57 Room: [facility] OR ROOM 01 Gender: Female Note Status: Finalized Instrument Name: [id number] Procedure:             Upper GI endoscopy Indications:           Iron deficiency anemia Providers:             Toney Reil MD, MD Referring MD:          Dana Allan (Referring MD) Medicines:             General Anesthesia Complications:         No immediate complications. Estimated blood loss: None. Procedure:             Pre-Anesthesia Assessment:                        - Prior to the procedure, a History and Physical was                         performed, and patient medications and allergies were                         reviewed. The patient is competent. The risks and                         benefits of the procedure and the sedation options and                         risks were discussed with the patient. All questions                         were answered and informed consent was obtained.                         Patient identification and proposed procedure were                         verified by the physician, the nurse, the                         anesthesiologist, the anesthetist and the technician                         in the pre-procedure area in the procedure room in the                         endoscopy suite. Mental Status Examination: alert and                         oriented. Airway Examination: normal oropharyngeal                         airway and neck mobility. Respiratory Examination:                         clear to auscultation. CV Examination: normal.  Prophylactic Antibiotics: The patient does not require                         prophylactic antibiotics. Prior Anticoagulants: The                         patient has taken no anticoagulant  or antiplatelet                         agents. ASA Grade Assessment: III - A patient with                         severe systemic disease. After reviewing the risks and                         benefits, the patient was deemed in satisfactory                         condition to undergo the procedure. The anesthesia                         plan was to use general anesthesia. Immediately prior                         to administration of medications, the patient was                         re-assessed for adequacy to receive sedatives. The                         heart rate, respiratory rate, oxygen saturations,                         blood pressure, adequacy of pulmonary ventilation, and                         response to care were monitored throughout the                         procedure. The physical status of the patient was                         re-assessed after the procedure.                        After obtaining informed consent, the endoscope was                         passed under direct vision. Throughout the procedure,                         the patient's blood pressure, pulse, and oxygen                         saturations were monitored continuously. The Endoscope                         was introduced through the mouth, and advanced to the  efferent jejunal loop. The upper GI endoscopy was                         accomplished without difficulty. The patient tolerated                         the procedure well. Findings:      Evidence of a Roux-en-Y gastrojejunostomy was found. The gastrojejunal       anastomosis was characterized by healthy appearing mucosa. This was       traversed. The pouch-to-jejunum limb was characterized by healthy       appearing mucosa.      The cardia was normal.      The gastroesophageal junction and examined esophagus were normal. Impression:            - Roux-en-Y gastrojejunostomy with gastrojejunal                          anastomosis characterized by healthy appearing mucosa.                        - Normal cardia.                        - Normal gastroesophageal junction and esophagus.                        - No specimens collected. Recommendation:        - Proceed with colonoscopy as scheduled                        See colonoscopy report Procedure Code(s):     --- Professional ---                        [phone], Esophagogastroduodenoscopy, flexible,                         transoral; diagnostic, including collection of                         specimen(s) by brushing or washing, when performed                         (separate procedure) Diagnosis Code(s):     --- Professional ---                        Z98.0, Intestinal bypass and anastomosis status                        D50.9, Iron deficiency anemia, unspecified CPT copyright 2022 American Medical Association. All rights reserved. The codes documented in this report are preliminary and upon coder review may  be revised to meet current compliance requirements. Dr. Libby Maw Toney Reil MD, MD 02/24/2023 9:42:47 AM This report has been signed electronically. Number of Addenda: 0 Note Initiated On: 02/24/2023 9:24 AM Total Procedure Duration: 0 hours 4 minutes 34 seconds  Estimated Blood Loss:  Estimated blood loss: none.      [facility]

## 2023-02-24 NOTE — Anesthesia Postprocedure Evaluation (Signed)
Anesthesia Post Note  Patient: Shelby Colon  Procedure(s) Performed: COLONOSCOPY WITH PROPOFOL (Rectum) ESOPHAGOGASTRODUODENOSCOPY (EGD) WITH PROPOFOL (Mouth) POLYPECTOMY (Rectum)  Patient location during evaluation: PACU Anesthesia Type: General Level of consciousness: awake and alert Pain management: pain level controlled Vital Signs Assessment: post-procedure vital signs reviewed and stable Respiratory status: spontaneous breathing, nonlabored ventilation, respiratory function stable and patient connected to nasal cannula oxygen Cardiovascular status: blood pressure returned to baseline and stable Postop Assessment: no apparent nausea or vomiting Anesthetic complications: no   No notable events documented.   Last Vitals:  Vitals:   02/24/23 1015 02/24/23 1019  BP: 105/72 107/69  Pulse: 81 84  Resp: 18 (!) 24  Temp:  (!) 36.3 C  SpO2: 99% 100%    Last Pain:  Vitals:   02/24/23 1019  TempSrc:   PainSc: 0-No pain                 Denna Fryberger C Idabell Picking

## 2023-02-24 NOTE — Transfer of Care (Signed)
Immediate Anesthesia Transfer of Care Note  Patient: Shelby Colon  Procedure(s) Performed: COLONOSCOPY WITH PROPOFOL (Rectum) ESOPHAGOGASTRODUODENOSCOPY (EGD) WITH PROPOFOL (Mouth) POLYPECTOMY (Rectum)  Patient Location: PACU  Anesthesia Type: General  Level of Consciousness: awake, alert  and patient cooperative  Airway and Oxygen Therapy: Patient Spontanous Breathing and Patient connected to supplemental oxygen  Post-op Assessment: Post-op Vital signs reviewed, Patient's Cardiovascular Status Stable, Respiratory Function Stable, Patent Airway and No signs of Nausea or vomiting  Post-op Vital Signs: Reviewed and stable  Complications: No notable events documented.

## 2023-02-28 ENCOUNTER — Encounter: Payer: Self-pay | Admitting: Gastroenterology

## 2023-02-28 LAB — SURGICAL PATHOLOGY

## 2023-03-03 ENCOUNTER — Encounter: Payer: Self-pay | Admitting: Gastroenterology

## 2023-03-12 ENCOUNTER — Other Ambulatory Visit: Payer: Self-pay | Admitting: Family Medicine

## 2023-03-22 ENCOUNTER — Other Ambulatory Visit: Payer: Self-pay

## 2023-03-23 NOTE — Progress Notes (Unsigned)
Celso Amy, PA-C [address]  [address]  [place], [place] [address]  Main: [phone]  Fax: [phone]   Primary Care Physician: Dana Allan, MD  Primary Gastroenterologist:  Celso Amy, PA-C / Dr. Lannette Donath    CC: F/U chronic iron deficiency anemia  HPI: Shelby Colon is a 57 y.o. female returns for 6-week follow-up of chronic iron deficiency anemia.  Has received IV iron infusions through Dr. Cathie Hoops hematologist.  History of gastric bypass at age 71.  Patient states she has history of chronic anemia all of her life.  Not taking oral iron.  Labs 02/01/2023: Negative celiac panel, stable hemoglobin 11.8.  Incidental elevated LFTs with alk phos 129, AST 55, ALT 53.  She admits to drinking occasional alcohol on the weekend, 1 glass of wine or 1 beer.  Minimal alcohol use.  EGD 02/24/2023 by Dr. Allegra Lai showed Roux-en-Y gastrojejunostomy with healthy mucosa.  No abnormality.  No biopsies.  Colonoscopy 02/24/2023 by Dr. Allegra Lai showed 1 small 3 mm tubular adenoma polyp removed from ascending colon, otherwise normal.  Good prep.  Repeat in 7 years.  Abdominal pelvic CT 01/2023 showed large volume of formed stool in the colon consistent with constipation.  Evidence of acid reflux.  Uterine fibroids.  Has history of asymptomatic cholelithiasis.  Also irregular bowel habits with diarrhea alternating with constipation.  She denies RUQ pain.  Has occasional chronic nausea but no vomiting.  Nausea is worse if she is under stress or eat certain foods.  She is not having any abdominal pain.  She denies weight loss.  She is frustrated about weight gain.  She drinks 80 ounces of water daily and eat keto low-carb diet.  Currently having bowel movement every 2 or 3 days which is soft.  She denies hard stools or straining.  She tried Linzess which caused severe diarrhea and she discontinued.  She is frustrated about fecal incontinence.  She has leakage and soiling of stool in her underwear.  No urge  for BM.  Went through premature menopause age 33.  Had back surgery for scoliosis and has chronic back pain.  Current Outpatient Medications  Medication Sig Dispense Refill   cyclobenzaprine (FLEXERIL) 10 MG tablet Take 1 tablet (10 mg total) by mouth 3 (three) times daily as needed for muscle spasms. This can make you sleepy. 60 tablet 0   fluticasone (FLONASE) 50 MCG/ACT nasal spray Place 2 sprays into both nostrils daily. (Patient taking differently: Place 2 sprays into both nostrils as needed.) 16 g 6   metoprolol succinate (TOPROL-XL) 25 MG 24 hr tablet TAKE 1 TABLET BY MOUTH DAILY 30 tablet 3   OVER THE COUNTER MEDICATION Sleep three-to help sleep     pregabalin (LYRICA) 50 MG capsule TAKE ONE CAPSULE BY MOUTH TWICE A DAY AND TAKE ONE CAPSULE BY MOUTH AT BEDTIME 90 capsule 0   VITAMIN D-VITAMIN K PO Take by mouth.     No current facility-administered medications for this visit.    Allergies as of 03/24/2023 - Review Complete 03/24/2023  Allergen Reaction Noted   Morphine Nausea And Vomiting 05/14/2014   Venofer [iron sucrose] Other (See Comments) 12/29/2022    Past Medical History:  Diagnosis Date   Abnormal MRI, cervical spine 10/17/2019   FINDINGS: Alignment: Reversal of the normal cervical lordosis with apex at C3-4. Trace retrolisthesis of C4 on C5, C5 on C6, and C6 on C7, with anterolisthesis of C7 on T1. Findings chronic and facet mediated.   Vertebrae:  Vertebral body height maintained without evidence for acute or chronic fracture. Bone marrow signal intensity within normal limits. No discrete or worrisome osseous lesions. Pro   Abnormal Pap smear of cervix    h/o LSIL pap with HPV +; 01/2019 pap negative negative HPV   Alcohol use 02/09/2022   Altered mental status 12/08/2021   Altered mental status 12/08/2021   Annual physical exam 06/12/2020   Anxiety    Anxiety 06/12/2020   Anxiety and depression 11/30/2017   Atrial fibrillation (HCC)    Atrophy of right kidney  03/05/2019   Left kidney ok   B12 deficiency    Benign neoplasm of ascending colon    Brain cyst 12/10/2021   Cardiac murmur 02/14/2018   as a child   Chronic knee pain (Bilateral) 09/07/2018   09/21/2018 left total knee ortho [place] [facility]    Chronic pain syndrome    Colon polyps    Confusion 12/10/2021   Congenital absence of right kidney    Degeneration of lumbar intervertebral disc    Back surgery, left leg knee down numb and tingling   Depression    Early menopause    Elevated alkaline phosphatase level 02/23/2019   Elevated BP without diagnosis of hypertension 08/04/2021   Last Assessment & Plan: Formatting of this note might be different from the original. BP at last visit was 131/85 and today is 156/88.  Advised patient to come back within the next 2 to 4 weeks to recheck her blood pressure.  Goal BP is less than 140/90.   Gallstones    Gastric bypass status for obesity    2013, weight 258lb   Grade I diastolic dysfunction    Headache    History of   Heart murmur    MVP   History of chicken pox    HPV in female 11/30/2017   Hypercholesterolemia    Iron deficiency    Iron deficiency anemia 03/02/2019   Mild mitral regurgitation by prior echocardiogram    Mild tricuspid regurgitation by prior echocardiogram    Neuromuscular disorder (HCC)    Neuropathy    Paroxysmal SVT (supraventricular tachycardia) (HCC)    Personal history of colonic polyps    Pharmacologic therapy 10/17/2019   Polyp of sigmoid colon    Problems influencing health status 10/17/2019   PTSD (post-traumatic stress disorder)    Rheumatoid arthritis (HCC)    as child    Scoliosis of lumbar spine    SIADH (syndrome of inappropriate ADH production) (HCC) 01/01/2022   Solitary kidney, congenital    pt thinks has left kidney only no right kidney   Thrombocytosis 02/23/2019   Thyroid disease    in the past    Transaminitis 12/08/2021   UTI (urinary tract infection)     Vitamin D deficiency    Wears partial dentures    bottom    Past Surgical History:  Procedure Laterality Date   ANTERIOR CERVICAL DECOMPRESSION/DISCECTOMY FUSION 4 LEVELS N/A 11/28/2019   Procedure: ANTERIOR CERVICAL DECOMPRESSION/DISCECTOMY FUSION 4 LEVELS C3-7;  Surgeon: Venetia Night, MD;  Location: [facility];  Service: Neurosurgery;  Laterality: N/A;   AUGMENTATION MAMMAPLASTY Bilateral 2009   BACK SURGERY  11/04/2014   fusion of spine of lumbosacral region 2 rods and 16 screws    BREAST ENHANCEMENT SURGERY     COLONOSCOPY WITH PROPOFOL N/A 01/06/2018   Procedure: COLONOSCOPY WITH PROPOFOL;  Surgeon: Pasty Spillers, MD;  Location: [facility];  Service: Endoscopy;  Laterality: N/A;   COLONOSCOPY WITH PROPOFOL N/A 03/19/2019   Procedure: COLONOSCOPY WITH PROPOFOL;  Surgeon: Pasty Spillers, MD;  Location: [facility];  Service: Endoscopy;  Laterality: N/A;   COLONOSCOPY WITH PROPOFOL N/A 02/24/2023   Procedure: COLONOSCOPY WITH PROPOFOL;  Surgeon: Toney Reil, MD;  Location: [facility];  Service: Endoscopy;  Laterality: N/A;   COSMETIC SURGERY     ESOPHAGOGASTRODUODENOSCOPY (EGD) WITH PROPOFOL N/A 02/24/2023   Procedure: ESOPHAGOGASTRODUODENOSCOPY (EGD) WITH PROPOFOL;  Surgeon: Toney Reil, MD;  Location: [facility];  Service: Endoscopy;  Laterality: N/A;   excess skin removal     JOINT REPLACEMENT  09/20/2008   both knees done right was last year and left just done this   KNEE ARTHROSCOPY Bilateral    x 7 knees b/l (2 surgeries on right knee) total 1 bone graft and 5 arthroscopy total knee    POLYPECTOMY  02/24/2023   Procedure: POLYPECTOMY;  Surgeon: Toney Reil, MD;  Location: [facility];  Service: Endoscopy;;   ROUX-EN-Y PROCEDURE     SPINE SURGERY  11/07/2014   2 rods and 16 screws   TOTAL KNEE ARTHROPLASTY     left 09/21/18 Dr. Dema Severin II Franco Collet   TOTAL KNEE ARTHROPLASTY     right knee Dr.  Elliot Gurney     Review of Systems:    All systems reviewed and negative except where noted in HPI.   Physical Examination:   BP (!) 150/74   Pulse 77   Temp 97.8 F (36.6 C)   Ht 5\' 2"  (1.575 m)   Wt 161 lb 9.6 oz (73.3 kg)   BMI 29.56 kg/m   General: Well-nourished, well-developed in no acute distress.  Neuro: Alert and oriented x 3.  Grossly intact.  Psych: Alert and cooperative, normal mood and affect.   Imaging Studies: No results found.  Assessment and Plan:   Shelby Colon is a 57 y.o. y/o female returns for follow-up of chronic iron deficiency anemia most likely secondary to gastric bypass surgery.  Recent EGD and colonoscopy unrevealing for sources.  Celiac labs negative.  History of chronic lifelong anemia.  1.  Chronic iron deficiency anemia  Continue IV iron through hematology Dr. Cathie Hoops as needed  Lab: CBC, iron panel, ferritin  2.  Elevated LFTs  Lab: Hepatic panel  Recommend avoid alcohol, Tylenol, acetaminophen  3.  Chronic constipation /IBS-C with fecal incontinence  She cannot tolerate Linzess which caused diarrhea.  Start Benefiber powder mix 1 to 2 tablespoons in a drink every day.  4.  Asymptomatic cholelithiasis; no RUQ pain.  Surgical referral if it becomes symptomatic  Recommend low-fat diet  5.  History of adenomatous colon polyp  Repeat colonoscopy in 7 years  Celso Amy, PA-C  Follow up in 3 months with TG.

## 2023-03-24 ENCOUNTER — Encounter: Payer: Self-pay | Admitting: Physician Assistant

## 2023-03-24 ENCOUNTER — Ambulatory Visit: Payer: Medicare HMO | Admitting: Physician Assistant

## 2023-03-24 VITALS — BP 150/74 | HR 77 | Temp 97.8°F | Ht 62.0 in | Wt 161.6 lb

## 2023-03-24 DIAGNOSIS — K581 Irritable bowel syndrome with constipation: Secondary | ICD-10-CM

## 2023-03-24 DIAGNOSIS — D509 Iron deficiency anemia, unspecified: Secondary | ICD-10-CM

## 2023-03-24 DIAGNOSIS — K8020 Calculus of gallbladder without cholecystitis without obstruction: Secondary | ICD-10-CM

## 2023-03-24 DIAGNOSIS — Z860101 Personal history of adenomatous and serrated colon polyps: Secondary | ICD-10-CM

## 2023-03-24 DIAGNOSIS — R7989 Other specified abnormal findings of blood chemistry: Secondary | ICD-10-CM | POA: Diagnosis not present

## 2023-03-24 DIAGNOSIS — K5904 Chronic idiopathic constipation: Secondary | ICD-10-CM

## 2023-03-24 DIAGNOSIS — R159 Full incontinence of feces: Secondary | ICD-10-CM

## 2023-03-24 DIAGNOSIS — Z9884 Bariatric surgery status: Secondary | ICD-10-CM

## 2023-03-24 DIAGNOSIS — K219 Gastro-esophageal reflux disease without esophagitis: Secondary | ICD-10-CM

## 2023-03-24 DIAGNOSIS — Z860100 Personal history of colon polyps, unspecified: Secondary | ICD-10-CM

## 2023-03-24 NOTE — Patient Instructions (Signed)
   Start OTC Benefiber Powder. Mix 1 - 2 Tablespoons in 6 - 8 ounces of a Drink Once Daily. Drink 64 ounces of water / fluids Daily.

## 2023-03-25 LAB — CBC WITH DIFFERENTIAL/PLATELET
Basophils Absolute: 0.1 10*3/uL (ref 0.0–0.2)
Basos: 1 %
EOS (ABSOLUTE): 0.2 10*3/uL (ref 0.0–0.4)
Eos: 2 %
Hematocrit: 41.1 % (ref 34.0–46.6)
Hemoglobin: 12.6 g/dL (ref 11.1–15.9)
Immature Grans (Abs): 0 10*3/uL (ref 0.0–0.1)
Immature Granulocytes: 0 %
Lymphocytes Absolute: 1.7 10*3/uL (ref 0.7–3.1)
Lymphs: 21 %
MCH: 26.9 pg (ref 26.6–33.0)
MCHC: 30.7 g/dL — ABNORMAL LOW (ref 31.5–35.7)
MCV: 88 fL (ref 79–97)
Monocytes Absolute: 0.5 10*3/uL (ref 0.1–0.9)
Monocytes: 6 %
Neutrophils Absolute: 5.7 10*3/uL (ref 1.4–7.0)
Neutrophils: 70 %
Platelets: 375 10*3/uL (ref 150–450)
RBC: 4.69 x10E6/uL (ref 3.77–5.28)
RDW: 13.3 % (ref 11.7–15.4)
WBC: 8.2 10*3/uL (ref 3.4–10.8)

## 2023-03-25 LAB — HEPATIC FUNCTION PANEL
ALT: 31 [IU]/L (ref 0–32)
AST: 33 [IU]/L (ref 0–40)
Albumin: 4.7 g/dL (ref 3.8–4.9)
Alkaline Phosphatase: 141 [IU]/L — ABNORMAL HIGH (ref 44–121)
Bilirubin Total: 0.3 mg/dL (ref 0.0–1.2)
Bilirubin, Direct: <0.1 mg/dL (ref 0.00–0.40)
Total Protein: 7.1 g/dL (ref 6.0–8.5)

## 2023-03-25 LAB — IRON,TIBC AND FERRITIN PANEL
Ferritin: 236 ng/mL — ABNORMAL HIGH (ref 15–150)
Iron Saturation: 37 % (ref 15–55)
Iron: 122 ug/dL (ref 27–159)
Total Iron Binding Capacity: 328 ug/dL (ref 250–450)
UIBC: 206 ug/dL (ref 131–425)

## 2023-03-28 NOTE — Progress Notes (Unsigned)
Outpatient Gynecology Note: Annual Visit   Subjective:    PCP: Dana Allan, MD Shelby Colon is a 57 y.o. female No obstetric history on file. who presents for annual wellness visit.   Well Woman Visit:  GYN HISTORY:  No LMP recorded. Patient is postmenopausal.     Menstrual History: OB History   No obstetric history on file.     Menarche age: *** No LMP recorded. Patient is postmenopausal.     Periods are every *** days, and last *** days, flow is light / moderate / heavy.  Uses pads / tampons / menstrual cup and changes it every *** hours.  Cramping is mild / moderate / severe.  Cyclic symptoms include: {symptoms; gyn cyclic:13153}.  Intermenstrual bleeding, spotting, or discharge? *** Urinary incontinence? ***  Sexually active: *** Number of sexual partners: *** Gender of sexual Partners: *** Social History   Substance and Sexual Activity  Sexual Activity Not Currently   Contraceptive methods: {PLAN CONTRACEPTION:313102} Dyspareunia? *** STI history: *** STI/HIV testing or immunizations needed? {yes UJ:811914}   Health Maintenance: -Last pap: was normal 02/12/2019 --> Any abnormals:  -Last mammogram: normal 04/20/22  --> Any abnormals? *** -Last colon cancer screen: 02/24/23 / Type: COLONOSCOPY WITH PROPOFOL  -Last DEXA scan: *** [facility] - maternal aunt  -Vaccines:  Immunization History  Administered Date(s) Administered   Hepb-cpg 08/18/2022, 09/16/2022   Influenza,inj,Quad PF,6+ Mos 06/10/2020   Influenza-Unspecified 04/14/2019   PFIZER(Purple Top)SARS-COV-2 Vaccination 12/22/2019, 01/12/2020   Td 02/01/2017   Tdap 02/01/2017   Last Tdap: 02/01/17 / Flu: *** / COVID: 2021 / Gardasil: *** / Shingles (50+): *** / PCV20:  -Hep C screen: *** -Last lipid / glucose screening: 07/28/21  > Exercise: {misc; exercise types:16438}, {exercise level:31265} > Dietary Supplements: Folate: {yes/no:20286};  Calcium: {yes/no:20286}}; Vitamin D:  {yes/no:20286} > There is no height or weight on file to calculate BMI.  > Recent dental visit {yes no:314532} > Seat Belt Use: {yes no:314532} > Texting and driving? {yes no:314532} > Guns in the house {yes no:314532} > How much do you drink in 1 week? (Amount) / (Type) > Tobacco or other drug use: {Drug Use:32241} Tobacco Use: Low Risk  (03/24/2023)   Patient History    Smoking Tobacco Use: Never    Smokeless Tobacco Use: Never    Passive Exposure: Not on file    Social History   Tobacco Use   Smoking status: Never   Smokeless tobacco: Never   Tobacco comments:    Never smoked  Substance Use Topics   Alcohol use: Not Currently    Alcohol/week: 1.0 standard drink of alcohol    Types: 1 Glasses of wine per week    Comment: SOCIALLY   Occupation ****.    Lives with ***    PHQ-2 Score: In last two weeks, how often have you felt: Little interest or pleasure in doing things: {PHQGADfrequency:29690::"Not at all (0)"} Feeling down, depressed or hopeless: {PHQGADfrequency:29690::"Not at all (0)"} Score:   GAD-2 Over the last 2 weeks, how often have you been bothered by the following problems? Feeling nervous, anxious or on edge: {PHQGADfrequency:29690::"Not at all (0)"} Not being able to stop or control worrying: {PHQGADfrequency:29690::"Not at all (0)"}} Score: _________________________________________________________  Current Outpatient Medications  Medication Sig Dispense Refill   cyclobenzaprine (FLEXERIL) 10 MG tablet Take 1 tablet (10 mg total) by mouth 3 (three) times daily as needed for muscle spasms. This can make you sleepy. 60 tablet 0   fluticasone (FLONASE) 50  MCG/ACT nasal spray Place 2 sprays into both nostrils daily. (Patient taking differently: Place 2 sprays into both nostrils as needed.) 16 g 6   metoprolol succinate (TOPROL-XL) 25 MG 24 hr tablet TAKE 1 TABLET BY MOUTH DAILY 30 tablet 3   OVER THE COUNTER MEDICATION Sleep three-to help sleep      pregabalin (LYRICA) 50 MG capsule TAKE ONE CAPSULE BY MOUTH TWICE A DAY AND TAKE ONE CAPSULE BY MOUTH AT BEDTIME 90 capsule 0   VITAMIN D-VITAMIN K PO Take by mouth.     No current facility-administered medications for this visit.   Allergies  Allergen Reactions   Morphine Nausea And Vomiting   Venofer [Iron Sucrose] Other (See Comments)    12/28/22- Throat tightness and coughing. Patient sent to ED. Steroid pre-meds will be given prior to future infusions. See progress note from 12/28/2022.    Past Medical History:  Diagnosis Date   Abnormal MRI, cervical spine 10/17/2019   FINDINGS: Alignment: Reversal of the normal cervical lordosis with apex at C3-4. Trace retrolisthesis of C4 on C5, C5 on C6, and C6 on C7, with anterolisthesis of C7 on T1. Findings chronic and facet mediated.   Vertebrae: Vertebral body height maintained without evidence for acute or chronic fracture. Bone marrow signal intensity within normal limits. No discrete or worrisome osseous lesions. Pro   Abnormal Pap smear of cervix    h/o LSIL pap with HPV +; 01/2019 pap negative negative HPV   Alcohol use 02/09/2022   Altered mental status 12/08/2021   Altered mental status 12/08/2021   Annual physical exam 06/12/2020   Anxiety    Anxiety 06/12/2020   Anxiety and depression 11/30/2017   Atrial fibrillation (HCC)    Atrophy of right kidney 03/05/2019   Left kidney ok   B12 deficiency    Benign neoplasm of ascending colon    Brain cyst 12/10/2021   Cardiac murmur 02/14/2018   as a child   Chronic knee pain (Bilateral) 09/07/2018   09/21/2018 left total knee ortho  [facility]    Chronic pain syndrome    Colon polyps    Confusion 12/10/2021   Congenital absence of right kidney    Degeneration of lumbar intervertebral disc    Back surgery, left leg knee down numb and tingling   Depression    Early menopause    Elevated alkaline phosphatase level 02/23/2019   Elevated BP without  diagnosis of hypertension 08/04/2021   Last Assessment & Plan: Formatting of this note might be different from the original. BP at last visit was 131/85 and today is 156/88.  Advised patient to come back within the next 2 to 4 weeks to recheck her blood pressure.  Goal BP is less than 140/90.   Gallstones    Gastric bypass status for obesity    2013, weight 258lb   Grade I diastolic dysfunction    Headache    History of   Heart murmur    MVP   History of chicken pox    HPV in female 11/30/2017   Hypercholesterolemia    Iron deficiency    Iron deficiency anemia 03/02/2019   Mild mitral regurgitation by prior echocardiogram    Mild tricuspid regurgitation by prior echocardiogram    Neuromuscular disorder (HCC)    Neuropathy    Paroxysmal SVT (supraventricular tachycardia) (HCC)    Personal history of colonic polyps    Pharmacologic therapy 10/17/2019   Polyp of sigmoid colon  Problems influencing health status 10/17/2019   PTSD (post-traumatic stress disorder)    Rheumatoid arthritis (HCC)    as child    Scoliosis of lumbar spine    SIADH (syndrome of inappropriate ADH production) (HCC) 01/01/2022   Solitary kidney, congenital    pt thinks has left kidney only no right kidney   Thrombocytosis 02/23/2019   Thyroid disease    in the past    Transaminitis 12/08/2021   UTI (urinary tract infection)    Vitamin D deficiency    Wears partial dentures    bottom   Past Surgical History:  Procedure Laterality Date   ANTERIOR CERVICAL DECOMPRESSION/DISCECTOMY FUSION 4 LEVELS N/A 11/28/2019   Procedure: ANTERIOR CERVICAL DECOMPRESSION/DISCECTOMY FUSION 4 LEVELS C3-7;  Surgeon: Venetia Night, MD;  Location: [facility];  Service: Neurosurgery;  Laterality: N/A;   AUGMENTATION MAMMAPLASTY Bilateral 2009   BACK SURGERY  11/04/2014   fusion of spine of lumbosacral region 2 rods and 16 screws    BREAST ENHANCEMENT SURGERY     COLONOSCOPY WITH PROPOFOL N/A 01/06/2018   Procedure:  COLONOSCOPY WITH PROPOFOL;  Surgeon: Pasty Spillers, MD;  Location: [facility];  Service: Endoscopy;  Laterality: N/A;   COLONOSCOPY WITH PROPOFOL N/A 03/19/2019   Procedure: COLONOSCOPY WITH PROPOFOL;  Surgeon: Pasty Spillers, MD;  Location: [facility];  Service: Endoscopy;  Laterality: N/A;   COLONOSCOPY WITH PROPOFOL N/A 02/24/2023   Procedure: COLONOSCOPY WITH PROPOFOL;  Surgeon: Toney Reil, MD;  Location: [facility];  Service: Endoscopy;  Laterality: N/A;   COSMETIC SURGERY     ESOPHAGOGASTRODUODENOSCOPY (EGD) WITH PROPOFOL N/A 02/24/2023   Procedure: ESOPHAGOGASTRODUODENOSCOPY (EGD) WITH PROPOFOL;  Surgeon: Toney Reil, MD;  Location: [facility];  Service: Endoscopy;  Laterality: N/A;   excess skin removal     JOINT REPLACEMENT  09/20/2008   both knees done right was last year and left just done this   KNEE ARTHROSCOPY Bilateral    x 7 knees b/l (2 surgeries on right knee) total 1 bone graft and 5 arthroscopy total knee    POLYPECTOMY  02/24/2023   Procedure: POLYPECTOMY;  Surgeon: Toney Reil, MD;  Location: [facility];  Service: Endoscopy;;   ROUX-EN-Y PROCEDURE     SPINE SURGERY  11/07/2014   2 rods and 16 screws   TOTAL KNEE ARTHROPLASTY     left 09/21/18 Dr. Dema Severin II Franco Collet   TOTAL KNEE ARTHROPLASTY     right knee Dr. Elliot Gurney    OB History   No obstetric history on file.     Review Of Systems  Constitutional: Denied constitutional symptoms, night sweats, recent illness, fatigue, fever, insomnia and weight loss.  Eyes: Denied eye symptoms, eye pain, photophobia, vision change and visual disturbance.  Ears/Nose/Throat/Neck: Denied ear, nose, throat or neck symptoms, hearing loss, nasal discharge, sinus congestion and sore throat.  Cardiovascular: Denied cardiovascular symptoms, arrhythmia, chest pain/pressure, edema, exercise intolerance, orthopnea and palpitations.  Respiratory:  Denied pulmonary symptoms, asthma, pleuritic pain, productive sputum, cough, dyspnea and wheezing.  Gastrointestinal: Denied, gastro-esophageal reflux, melena, nausea and vomiting.  Genitourinary:*** Denied genitourinary symptoms including symptomatic vaginal discharge, pelvic relaxation issues, and urinary complaints.  Musculoskeletal: Denied musculoskeletal symptoms, stiffness, swelling, muscle weakness and myalgia.  Dermatologic: Denied dermatology symptoms, rash and scar.  Neurologic: Denied neurology symptoms, dizziness, headache, neck pain and syncope.  Psychiatric: Denied psychiatric symptoms, anxiety and depression.  Endocrine: Denied endocrine symptoms including hot flashes and night sweats.  Objective:    There were no vitals taken for this visit.  Constitutional: Well-developed, well-nourished female in no acute distress Neurological: Alert and oriented to person, place, and time Psychiatric: Mood and affect appropriate Skin: No rashes or lesions Neck: Supple without masses. Trachea is midline.Thyroid is normal size without masses Lymphatics: No cervical, axillary, supraclavicular, or inguinal adenopathy noted Respiratory: Clear to auscultation bilaterally. Good air movement with normal work of breathing. Cardiovascular: Regular rate and rhythm. Extremities grossly normal, nontender with no edema; pulses regular Gastrointestinal: Soft, nontender, nondistended. No masses or hernias appreciated. No hepatosplenomegaly. No fluid wave. No rebound or guarding. Breast Exam: {Exam; breast:13139::"normal appearance, no masses or tenderness"} Genitourinary:         External Genitalia: Normal female genitalia    Vagina: {Vagina:210566}, no lesions.    Cervix: No lesions, normal size and consistency; no cervical motion tenderness; Pap obtained***    Uterus: Normal size and contour; smooth, mobile, NT, {Desc; anteverted/retroverted/midposition:60613}. Adnexae: Non-palpable and  non-tender Perineum/Anus: No lesions Rectal: deferred    Assessment/Plan:    Shelby Colon is a 57 y.o. female No obstetric history on file. with normal well-woman gynecologic exam.  -Screenings:  Pap: done with cotesting today  *** w/rflx today Mammogram: ordered***due *** Colon: ordered colonoscopy***Cologuard -OR- due *** Labs: ***A1C, CMP, HepC, Lipid panel, Vit D, TSH PHQ-2 = 0,    = ___, discussed coping techniques; RTC if worsens or develops concern -Contraception: *** -Vaccines: UTD, Tdap today; pt declines Influenza. Gardasil: series not started, declined today.  -Healthy lifestyle modifications discussed: multivitamin, diet, exercise, sunscreen, tobacco and alcohol use. Emphasized importance of regular physical activity.  -Folate***Calcium and Vit D recommendation reviewed.  -All questions answered to patient's satisfaction.  -RTC 1 yr for annual, sooner prn.   No follow-ups on file.    Julieanne Manson, DO [facility]

## 2023-03-30 ENCOUNTER — Ambulatory Visit: Payer: Medicare HMO | Admitting: Obstetrics

## 2023-03-30 ENCOUNTER — Encounter: Payer: Self-pay | Admitting: Obstetrics

## 2023-03-30 VITALS — BP 140/80 | Ht 62.0 in | Wt 164.0 lb

## 2023-03-30 DIAGNOSIS — Z01419 Encounter for gynecological examination (general) (routine) without abnormal findings: Secondary | ICD-10-CM | POA: Diagnosis not present

## 2023-03-30 DIAGNOSIS — Z13220 Encounter for screening for lipoid disorders: Secondary | ICD-10-CM

## 2023-04-15 ENCOUNTER — Inpatient Hospital Stay: Payer: Medicare HMO | Attending: Oncology

## 2023-04-15 DIAGNOSIS — E538 Deficiency of other specified B group vitamins: Secondary | ICD-10-CM | POA: Insufficient documentation

## 2023-04-15 DIAGNOSIS — D509 Iron deficiency anemia, unspecified: Secondary | ICD-10-CM | POA: Insufficient documentation

## 2023-04-15 DIAGNOSIS — Z79899 Other long term (current) drug therapy: Secondary | ICD-10-CM | POA: Diagnosis not present

## 2023-04-15 LAB — VITAMIN B12: Vitamin B-12: 416 pg/mL (ref 180–914)

## 2023-04-15 LAB — IRON AND TIBC
Iron: 104 ug/dL (ref 28–170)
Saturation Ratios: 29 % (ref 10.4–31.8)
TIBC: 356 ug/dL (ref 250–450)
UIBC: 252 ug/dL

## 2023-04-15 LAB — RETIC PANEL
Immature Retic Fract: 6.7 % (ref 2.3–15.9)
RBC.: 4.19 MIL/uL (ref 3.87–5.11)
Retic Count, Absolute: 72.5 10*3/uL (ref 19.0–186.0)
Retic Ct Pct: 1.7 % (ref 0.4–3.1)
Reticulocyte Hemoglobin: 29.4 pg (ref >27.9)

## 2023-04-15 LAB — CBC WITH DIFFERENTIAL (CANCER CENTER ONLY)
Abs Immature Granulocytes: 0.02 10*3/uL (ref 0.00–0.07)
Basophils Absolute: 0.1 10*3/uL (ref 0.0–0.1)
Basophils Relative: 1 %
Eosinophils Absolute: 0.2 10*3/uL (ref 0.0–0.5)
Eosinophils Relative: 3 %
HCT: 37.5 % (ref 36.0–46.0)
Hemoglobin: 11.4 g/dL — ABNORMAL LOW (ref 12.0–15.0)
Immature Granulocytes: 0 %
Lymphocytes Relative: 20 %
Lymphs Abs: 1.4 10*3/uL (ref 0.7–4.0)
MCH: 27.1 pg (ref 26.0–34.0)
MCHC: 30.4 g/dL (ref 30.0–36.0)
MCV: 89.3 fL (ref 80.0–100.0)
Monocytes Absolute: 0.4 10*3/uL (ref 0.1–1.0)
Monocytes Relative: 6 %
Neutro Abs: 5.1 10*3/uL (ref 1.7–7.7)
Neutrophils Relative %: 70 %
Platelet Count: 376 10*3/uL (ref 150–400)
RBC: 4.2 MIL/uL (ref 3.87–5.11)
RDW: 15 % (ref 11.5–15.5)
WBC Count: 7.2 10*3/uL (ref 4.0–10.5)
nRBC: 0 % (ref 0.0–0.2)

## 2023-04-15 LAB — FOLATE: Folate: 7.8 ng/mL (ref >5.9)

## 2023-04-15 LAB — FERRITIN: Ferritin: 83 ng/mL (ref 11–307)

## 2023-04-19 ENCOUNTER — Encounter: Payer: Self-pay | Admitting: Oncology

## 2023-04-19 ENCOUNTER — Ambulatory Visit: Payer: Medicare HMO | Admitting: Oncology

## 2023-04-19 ENCOUNTER — Inpatient Hospital Stay (HOSPITAL_BASED_OUTPATIENT_CLINIC_OR_DEPARTMENT_OTHER): Payer: Medicare HMO | Admitting: Oncology

## 2023-04-19 ENCOUNTER — Ambulatory Visit: Payer: Medicare HMO

## 2023-04-19 ENCOUNTER — Inpatient Hospital Stay: Payer: Medicare HMO

## 2023-04-19 VITALS — BP 129/90 | HR 81 | Temp 97.8°F | Resp 18 | Wt 169.1 lb

## 2023-04-19 VITALS — BP 129/79 | HR 87 | Temp 98.0°F | Resp 18

## 2023-04-19 DIAGNOSIS — Z9884 Bariatric surgery status: Secondary | ICD-10-CM | POA: Diagnosis not present

## 2023-04-19 DIAGNOSIS — D509 Iron deficiency anemia, unspecified: Secondary | ICD-10-CM

## 2023-04-19 DIAGNOSIS — E538 Deficiency of other specified B group vitamins: Secondary | ICD-10-CM | POA: Diagnosis not present

## 2023-04-19 DIAGNOSIS — D508 Other iron deficiency anemias: Secondary | ICD-10-CM | POA: Diagnosis not present

## 2023-04-19 DIAGNOSIS — Z79899 Other long term (current) drug therapy: Secondary | ICD-10-CM | POA: Diagnosis not present

## 2023-04-19 DIAGNOSIS — T8090XA Unspecified complication following infusion and therapeutic injection, initial encounter: Secondary | ICD-10-CM

## 2023-04-19 MED ORDER — B-12 1000 MCG SL SUBL: 1.0000 | SUBLINGUAL_TABLET | Freq: Every day | SUBLINGUAL | 11 refills | Status: DC

## 2023-04-19 MED ORDER — EPINEPHRINE 0.3 MG/0.3ML IJ SOAJ: 0.3000 mg | Freq: Once | INTRAMUSCULAR | Status: DC | PRN

## 2023-04-19 MED ORDER — LORAZEPAM 2 MG/ML IJ SOLN
0.5000 mg | INTRAMUSCULAR | Status: DC | PRN
  Administered 2023-04-19: 0.5 mg via INTRAVENOUS

## 2023-04-19 MED ORDER — LORAZEPAM 2 MG/ML IJ SOLN
1.0000 mg | Freq: Once | INTRAMUSCULAR | Status: DC
  Filled 2023-04-19: qty 1

## 2023-04-19 MED ORDER — CYANOCOBALAMIN 1000 MCG/ML IJ SOLN: 1000.0000 ug | Freq: Once | INTRAMUSCULAR | Status: DC

## 2023-04-19 MED ORDER — DIPHENHYDRAMINE HCL 25 MG PO CAPS: 50.0000 mg | ORAL_CAPSULE | ORAL | Status: DC | PRN

## 2023-04-19 MED ORDER — METHYLPREDNISOLONE SODIUM SUCC 125 MG IJ SOLR: 125.0000 mg | Freq: Once | INTRAMUSCULAR | Status: DC | PRN

## 2023-04-19 MED ORDER — LORAZEPAM 1 MG PO TABS
1.0000 mg | ORAL_TABLET | Freq: Once | ORAL | Status: DC
  Filled 2023-04-19: qty 1

## 2023-04-19 MED ORDER — SODIUM CHLORIDE 0.9 % IV SOLN
INTRAVENOUS | Status: DC
Start: 2023-04-19 — End: 2023-04-19
  Filled 2023-04-19: qty 250

## 2023-04-19 MED ORDER — IRON SUCROSE 20 MG/ML IV SOLN
200.0000 mg | Freq: Once | INTRAVENOUS | Status: AC
  Administered 2023-04-19: 200 mg via INTRAVENOUS
  Filled 2023-04-19: qty 10

## 2023-04-19 MED ORDER — LORAZEPAM 1 MG PO TABS: 1.0000 mg | ORAL_TABLET | ORAL | Status: DC | PRN

## 2023-04-19 MED ORDER — SODIUM CHLORIDE 0.9 % IV SOLN
Freq: Once | INTRAVENOUS | Status: DC | PRN
Start: 2023-04-19 — End: 2023-04-19
  Filled 2023-04-19: qty 250

## 2023-04-19 MED ORDER — DIPHENHYDRAMINE HCL 25 MG PO CAPS
50.0000 mg | ORAL_CAPSULE | Freq: Once | ORAL | Status: AC
  Administered 2023-04-19: 50 mg via ORAL
  Filled 2023-04-19: qty 2

## 2023-04-19 MED ORDER — ALBUTEROL SULFATE (2.5 MG/3ML) 0.083% IN NEBU
2.5000 mg | INHALATION_SOLUTION | Freq: Once | RESPIRATORY_TRACT | Status: DC | PRN
Start: 2023-04-19 — End: 2023-04-19
  Filled 2023-04-19: qty 3

## 2023-04-19 MED ORDER — DIPHENHYDRAMINE HCL 50 MG/ML IJ SOLN: 50.0000 mg | Freq: Once | INTRAMUSCULAR | Status: DC | PRN

## 2023-04-19 MED ORDER — FAMOTIDINE IN NACL 20-0.9 MG/50ML-% IV SOLN: 20.0000 mg | Freq: Once | INTRAVENOUS | Status: DC | PRN

## 2023-04-19 NOTE — Assessment & Plan Note (Signed)
At risk of Vitamin deficiency.  Adequate B12, folate

## 2023-04-19 NOTE — Progress Notes (Signed)
Pt here for follow up.

## 2023-04-19 NOTE — Progress Notes (Signed)
Hematology/Oncology Progress note [phone] [phone]      CHIEF COMPLAINTS/REASON FOR VISIT:  Follow up for iron deficiency anemia.  ASSESSMENT & PLAN:   Iron deficiency anemia Labs are reviewed and discussed with patient.  Lab Results  Component Value Date   HGB 11.4 (L) 04/15/2023   TIBC 356 04/15/2023   IRONPCTSAT 29 04/15/2023   FERRITIN 83 04/15/2023    Recommend Venofer 200mg  x 1 for maintenance.  She will get benadryl and Ativan as premed. Previous infusion reaction symptoms were felt to be due to anxiety.    H/O gastric bypass At risk of Vitamin deficiency.  Adequate B12, folate  Infusion reaction She may have had mild infusion reaction to venofer. She did not have anaphylactic reaction.    Orders Placed This Encounter  Procedures   CBC with Differential (Cancer Center Only)    Standing Status:   Future    Standing Expiration Date:   04/18/2024   Iron and TIBC    Standing Status:   Future    Standing Expiration Date:   04/18/2024   Ferritin    Standing Status:   Future    Standing Expiration Date:   04/18/2024   Retic Panel    Standing Status:   Future    Standing Expiration Date:   04/18/2024   Vitamin B12    Standing Status:   Future    Standing Expiration Date:   04/18/2024   Follow up in 6 months.  All questions were answered. The patient knows to call the clinic with any problems, questions or concerns.  Rickard Patience, MD, PhD [facility] Hematology Oncology 04/19/2023   HISTORY OF PRESENTING ILLNESS:  Shelby Colon is a 57 y.o. female who was seen in consultation at the request of Dana Allan, MD for evaluation of thromcbocytosis Reviewed patient's labs.  02/15/2019 Labs showed elevated platelet counts at 453, Hb 11.3,  wbc 7.3.   Reviewed patient's previous labs. Thrombocytosis onset is chronic onset , duration since 2019.  No aggravating or elevated factors. Associated symptoms or signs:  Denies weight loss, fever,  chills, fatigue, night sweats.  Context:  Smoking history: never smoker Family history of polycythemia. denies History of iron deficiency anemia. Yes.  History of DVT, denies History of gastric bypass. Yes.  Had a colonoscopy 03/19/2019.   INTERVAL HISTORY Shelby Colon is a 57 y.o. female who has above history reviewed by me today presents for follow up visit for management of iron deficiency anemia 12/28/22 She has had pSOB, sensation of throat tightness and coughing after receiving  Venofer despite previously tolerated Venofer in 2022. Symptoms include  She was given IV steroid, IV benadryl, and sent to ER where she received racemic epinephrine nebulizer, IV pepcid, IV fluids.  There was no oropharyngeal edema. She appeared anxious and received Ativan. Symptoms resolved.  It was felt that her infusion reaction symptoms are due to anxiety.  Later, she was able to tolerate additional Venofer treatments with benadryl and ativan as premed.   Today she reports feeling well.  No new complaints.      Review of Systems  Constitutional:  Positive for fatigue. Negative for appetite change, chills and fever.  HENT:   Negative for hearing loss and voice change.   Eyes:  Negative for eye problems.  Respiratory:  Negative for chest tightness and cough.   Cardiovascular:  Negative for chest pain.  Gastrointestinal:  Negative for abdominal distention, abdominal pain and blood in stool.  Endocrine: Negative  for hot flashes.  Genitourinary:  Negative for difficulty urinating and frequency.   Musculoskeletal:  Negative for arthralgias.  Skin:  Negative for itching and rash.  Neurological:  Negative for extremity weakness.  Hematological:  Negative for adenopathy.  Psychiatric/Behavioral:  Negative for confusion.     MEDICAL HISTORY:  Past Medical History:  Diagnosis Date   Abnormal MRI, cervical spine 10/17/2019   FINDINGS: Alignment: Reversal of the normal cervical lordosis with apex at C3-4.  Trace retrolisthesis of C4 on C5, C5 on C6, and C6 on C7, with anterolisthesis of C7 on T1. Findings chronic and facet mediated.   Vertebrae: Vertebral body height maintained without evidence for acute or chronic fracture. Bone marrow signal intensity within normal limits. No discrete or worrisome osseous lesions. Pro   Abnormal Pap smear of cervix    h/o LSIL pap with HPV +; 01/2019 pap negative negative HPV   Alcohol use 02/09/2022   Altered mental status 12/08/2021   Altered mental status 12/08/2021   Annual physical exam 06/12/2020   Anxiety    Anxiety 06/12/2020   Anxiety and depression 11/30/2017   Atrial fibrillation (HCC)    Atrophy of right kidney 03/05/2019   Left kidney ok   B12 deficiency    Benign neoplasm of ascending Colon    Brain cyst 12/10/2021   Cardiac murmur 02/14/2018   as a child   Chronic knee pain (Bilateral) 09/07/2018   09/21/2018 left total knee ortho [place] [facility]    Chronic pain syndrome    Colon polyps    Confusion 12/10/2021   Congenital absence of right kidney    Degeneration of lumbar intervertebral disc    Back surgery, left leg knee down numb and tingling   Depression    Early menopause    Elevated alkaline phosphatase level 02/23/2019   Elevated BP without diagnosis of hypertension 08/04/2021   Last Assessment & Plan: Formatting of this note might be different from the original. BP at last visit was 131/85 and today is 156/88.  Advised patient to come back within the next 2 to 4 weeks to recheck her blood pressure.  Goal BP is less than 140/90.   Gallstones    Gastric bypass status for obesity    2013, weight 258lb   Grade I diastolic dysfunction    Headache    History of   Heart murmur    MVP   History of chicken pox    HPV in female 11/30/2017   Hypercholesterolemia    Iron deficiency    Iron deficiency anemia 03/02/2019   Mild mitral regurgitation by prior echocardiogram    Mild tricuspid regurgitation by  prior echocardiogram    Neuromuscular disorder (HCC)    Neuropathy    Paroxysmal SVT (supraventricular tachycardia) (HCC)    Personal history of colonic polyps    Pharmacologic therapy 10/17/2019   Polyp of sigmoid Colon    Problems influencing health status 10/17/2019   PTSD (post-traumatic stress disorder)    Rheumatoid arthritis (HCC)    as child    Scoliosis of lumbar spine    SIADH (syndrome of inappropriate ADH production) (HCC) 01/01/2022   Solitary kidney, congenital    pt thinks has left kidney only no right kidney   Thrombocytosis 02/23/2019   Thyroid disease    in the past    Transaminitis 12/08/2021   UTI (urinary tract infection)    Vitamin D deficiency    Wears partial dentures  bottom    SURGICAL HISTORY: Past Surgical History:  Procedure Laterality Date   ANTERIOR CERVICAL DECOMPRESSION/DISCECTOMY FUSION 4 LEVELS N/A 11/28/2019   Procedure: ANTERIOR CERVICAL DECOMPRESSION/DISCECTOMY FUSION 4 LEVELS C3-7;  Surgeon: Venetia Night, MD;  Location: [facility] ORS;  Service: Neurosurgery;  Laterality: N/A;   AUGMENTATION MAMMAPLASTY Bilateral 2009   BACK SURGERY  11/04/2014   fusion of spine of lumbosacral region 2 rods and 16 screws    BREAST ENHANCEMENT SURGERY     COLONOSCOPY WITH PROPOFOL N/A 01/06/2018   Procedure: COLONOSCOPY WITH PROPOFOL;  Surgeon: Pasty Spillers, MD;  Location: [facility] END[facility]COPY;  Service: Endoscopy;  Laterality: N/A;   COLONOSCOPY WITH PROPOFOL N/A 03/19/2019   Procedure: COLONOSCOPY WITH PROPOFOL;  Surgeon: Pasty Spillers, MD;  Location: [facility] ENDOSCOPY;  Service: Endoscopy;  Laterality: N/A;   COLONOSCOPY WITH PROPOFOL N/A 02/24/2023   Procedure: COLONOSCOPY WITH PROPOFOL;  Surgeon: Toney Reil, MD;  Location: [facility];  Service: Endoscopy;  Laterality: N/A;   COSMETIC SURGERY     ESOPHAGOGASTRODUODENOSCOPY (EGD) WITH PROPOFOL N/A 02/24/2023   Procedure: ESOPHAGOGASTRODUODENOSCOPY (EGD) WITH PROPOFOL;  Surgeon:  Toney Reil, MD;  Location: [facility];  Service: Endoscopy;  Laterality: N/A;   excess skin removal     JOINT REPLACEMENT  09/20/2008   both knees done right was last year and left just done this   KNEE ARTHROSCOPY Bilateral    x 7 knees b/l (2 surgeries on right knee) total 1 bone graft and 5 arthroscopy total knee    POLYPECTOMY  02/24/2023   Procedure: POLYPECTOMY;  Surgeon: Toney Reil, MD;  Location: [facility];  Service: Endoscopy;;   ROUX-EN-Y PROCEDURE     SPINE SURGERY  11/07/2014   2 rods and 16 screws   TOTAL KNEE ARTHROPLASTY     left 09/21/18 Dr. Dema Severin II Franco Collet   TOTAL KNEE ARTHROPLASTY     right knee Dr. Elliot Gurney     SOCIAL HISTORY: Social History   Socioeconomic History   Marital status: Married    Spouse name: alex   Number of children: 0   Years of education: 16   Highest education level: Bachelor's degree (e.g., BA, AB, BS)  Occupational History   Not on file  Tobacco Use   Smoking status: Never   Smokeless tobacco: Never   Tobacco comments:    Never smoked  Vaping Use   Vaping status: Never Used  Substance and Sexual Activity   Alcohol use: Not Currently    Alcohol/week: 1.0 standard drink of alcohol    Types: 1 Glasses of wine per week    Comment: SOCIALLY   Drug use: Never   Sexual activity: Not Currently  Other Topics Concern   Not on file  Social History Narrative   Married since 2012    No kids    adopted   From [place] moved to [facility] and now lives here    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    BA degree    No guns, wears seat belt, safe in relationship    Social Determinants of Health   Financial Resource Strain: Medium Risk (11/01/2022)   Overall Financial Resource Strain (CARDIA)    Difficulty of Paying Living Expenses: Somewhat hard  Food Insecurity: No Food Insecurity (11/01/2022)   Hunger Vital  Sign    Worried About Running Out of Food in the Last Year: Never true  Ran Out of Food in the Last Year: Never true  Transportation Needs: No Transportation Needs (11/01/2022)   PRAPARE - Administrator, Civil Service (Medical): No    Lack of Transportation (Non-Medical): No  Physical Activity: Unknown (11/01/2022)   Exercise Vital Sign    Days of Exercise per Week: Patient declined    Minutes of Exercise per Session: 30 min  Recent Concern: Physical Activity - Insufficiently Active (08/18/2022)   Exercise Vital Sign    Days of Exercise per Week: 1 day    Minutes of Exercise per Session: 30 min  Stress: Stress Concern Present (11/01/2022)   [employer] of Occupational Health - Occupational Stress Questionnaire    Feeling of Stress : To some extent  Social Connections: Moderately Isolated (11/01/2022)   Social Connection and Isolation Panel [NHANES]    Frequency of Communication with Friends and Family: More than three times a week    Frequency of Social Gatherings with Friends and Family: Twice a week    Attends Religious Services: Never    Database administrator or Organizations: No    Attends Banker Meetings: Never    Marital Status: Married  Catering manager Violence: Not At Risk (08/18/2022)   Humiliation, Afraid, Rape, and Kick questionnaire    Fear of Current or Ex-Partner: No    Emotionally Abused: No    Physically Abused: No    Sexually Abused: No    FAMILY HISTORY: Family History  Adopted: Yes  Problem Relation Age of Onset   Arthritis Mother    Drug abuse Mother        heriod OD   Early death Mother    Diabetes Father    Hypertension Father    Depression Father    Arthritis Father    Drug abuse Father    Diabetes Sister    Fibromyalgia Sister    Rashes / Skin problems Sister        PSORIASIS   Diabetes Brother    Osteoarthritis Maternal Grandmother    Aneurysm Maternal Aunt    Thyroid disease Maternal Aunt    Fibromyalgia  Maternal Aunt    Breast cancer Maternal Aunt    Cancer Maternal Aunt        breast cancer     ALLERGIES:  is allergic to morphine.  MEDICATIONS:  Current Outpatient Medications  Medication Sig Dispense Refill   Cyanocobalamin (B-12) 1000 MCG SUBL Place 1 tablet under the tongue daily. 30 tablet 11   cyclobenzaprine (FLEXERIL) 10 MG tablet Take 1 tablet (10 mg total) by mouth 3 (three) times daily as needed for muscle spasms. This can make you sleepy. 60 tablet 0   fluticasone (FLONASE) 50 MCG/ACT nasal spray Place 2 sprays into both nostrils daily. (Patient taking differently: Place 2 sprays into both nostrils as needed.) 16 g 6   metoprolol succinate (TOPROL-XL) 25 MG 24 hr tablet TAKE 1 TABLET BY MOUTH DAILY 30 tablet 3   pregabalin (LYRICA) 50 MG capsule TAKE ONE CAPSULE BY MOUTH TWICE A DAY AND TAKE ONE CAPSULE BY MOUTH AT BEDTIME 90 capsule 0   VITAMIN D-VITAMIN K PO Take by mouth.     No current facility-administered medications for this visit.   Facility-Administered Medications Ordered in Other Visits  Medication Dose Route Frequency Provider Last Rate Last Admin   0.9 %  sodium chloride infusion   Intravenous Continuous Rickard Patience, MD   Stopped at 04/19/23 1340   0.9 %  sodium chloride infusion   Intravenous Once PRN Rickard Patience, MD       albuterol (PROVENTIL) (2.5 MG/3ML) 0.083% nebulizer solution 2.5 mg  2.5 mg Nebulization Once PRN Rickard Patience, MD       LORazepam (ATIVAN) injection 0.5 mg  0.5 mg Intravenous PRN Rickard Patience, MD   0.5 mg at 04/19/23 1207   LORazepam (ATIVAN) injection 1 mg  1 mg Intravenous Once Rickard Patience, MD         PHYSICAL EXAMINATION: ECOG PERFORMANCE STATUS: 1 - Symptomatic but completely ambulatory Vitals:   04/19/23 1050  BP: (!) 129/90  Pulse: 81  Resp: 18  Temp: 97.8 F (36.6 C)   Filed Weights   04/19/23 1050  Weight: 169 lb 1.6 oz (76.7 kg)    Physical Exam Constitutional:      General: She is not in acute distress. HENT:     Head:  Normocephalic and atraumatic.  Eyes:     General: No scleral icterus. Cardiovascular:     Rate and Rhythm: Normal rate and regular rhythm.  Pulmonary:     Effort: Pulmonary effort is normal.  Abdominal:     General: Bowel sounds are normal. There is no distension.     Palpations: Abdomen is soft.  Musculoskeletal:        General: No deformity. Normal range of motion.     Cervical back: Normal range of motion and neck supple.  Skin:    Findings: No erythema or rash.  Neurological:     Mental Status: She is alert and oriented to person, place, and time. Mental status is at baseline.     Cranial Nerves: No cranial nerve deficit.  Psychiatric:        Mood and Affect: Mood normal.      LABORATORY DATA:  I have reviewed the data as listed Lab Results  Component Value Date   WBC 7.2 04/15/2023   HGB 11.4 (L) 04/15/2023   HCT 37.5 04/15/2023   MCV 89.3 04/15/2023   PLT 376 04/15/2023   Recent Labs    06/29/22 1544 10/22/22 1330 02/01/23 1000 03/24/23 1011  NA 141 141 142  --   K 4.2 4.1 4.6  --   CL 99 104 105  --   CO2 33* 30 24  --   GLUCOSE 69* 86 74  --   BUN 19 20 19   --   CREATININE 0.89 0.94 0.88  --   CALCIUM 8.9 9.3 8.9  --   PROT 6.5 6.8 6.3 7.1  ALBUMIN 4.1 4.1 4.3 4.7  AST 38* 32 55* 33  ALT 36* 22 53* 31  ALKPHOS 100 95 129* 141*  BILITOT 0.3 0.4 <0.2 0.3  BILIDIR  --   --   --  <0.10   Iron/TIBC/Ferritin/ %Sat    Component Value Date/Time   IRON 104 04/15/2023 1219   IRON 122 03/24/2023 1011   TIBC 356 04/15/2023 1219   TIBC 328 03/24/2023 1011   FERRITIN 83 04/15/2023 1219   FERRITIN 236 (H) 03/24/2023 1011   IRONPCTSAT 29 04/15/2023 1219   IRONPCTSAT 37 03/24/2023 1011   IRONPCTSAT 31 12/05/2017 1044      RADIOGRAPHIC STUDIES: I have personally reviewed the radiological images as listed and agreed with the findings in the report. CT ABDOMEN PELVIS W CONTRAST  Result Date: 02/12/2023 CLINICAL DATA:  Bilateral lower abdominal pain.  EXAM: CT ABDOMEN AND PELVIS WITH CONTRAST TECHNIQUE: Multidetector CT imaging of the abdomen and pelvis was  performed using the standard protocol following bolus administration of intravenous contrast. RADIATION DOSE REDUCTION: This exam was performed according to the departmental dose-optimization program which includes automated exposure control, adjustment of the mA and/or kV according to patient size and/or use of iterative reconstruction technique. CONTRAST:  OMNIPAQUE IOHEXOL 300 MG/ML  SOLN COMPARISON:  Ultrasound March 02, 2019 . FINDINGS: Lower chest: Partially visualized bilateral breast augmentation. Ingested versus reflux contrast in the esophagus with symmetric distal esophageal wall thickening. Hepatobiliary: No suspicious hepatic lesion. Gallbladder is unremarkable. No biliary ductal dilation. Pancreas: No pancreatic ductal dilation or evidence of acute inflammation. Spleen: No splenomegaly. Adrenals/Urinary Tract: No suspicious adrenal mass. Right kidney is not identified and may be surgically absent. No left hydronephrosis or suspicious renal mass. Urinary bladder is unremarkable for degree of distension. Stomach/Bowel: Radiopaque enteric contrast material traverses distal loops of small bowel. Prior gastric surgery. Large volume of formed stool in the Colon. Normal appendix. No evidence of acute bowel inflammation. Vascular/Lymphatic: Normal caliber abdominal aorta. Smooth IVC contours. The portal, splenic and superior mesenteric vein are patent. No pathologically enlarged abdominal or pelvic lymph nodes. Reproductive: Nodular uterine contour commonly reflects leiomyomas. Other: No significant abdominopelvic free fluid. Musculoskeletal: T10-S1 posterior spinal fusion hardware. Lower thoracic degenerative change. Degenerative change of the bilateral hips. IMPRESSION: Evaluation is limited by streak artifact from posterior spinal fusion hardware. Within this context: 1. Large volume of formed  stool in the Colon, correlate for constipation. 2. Ingested versus reflux contrast in the esophagus with symmetric distal esophageal wall thickening, correlate for esophagitis. 3. Nodular uterine contour commonly reflects leiomyomas. Electronically Signed   By: Maudry Mayhew M.D.   On: 02/12/2023 14:31

## 2023-04-19 NOTE — Assessment & Plan Note (Addendum)
Labs are reviewed and discussed with patient.  Lab Results  Component Value Date   HGB 11.4 (L) 04/15/2023   TIBC 356 04/15/2023   IRONPCTSAT 29 04/15/2023   FERRITIN 83 04/15/2023    Recommend Venofer 200mg  x 1 for maintenance.  She will get benadryl and Ativan as premed. Previous infusion reaction symptoms were felt to be due to anxiety.

## 2023-04-19 NOTE — Assessment & Plan Note (Signed)
She may have had mild infusion reaction to venofer. She did not have anaphylactic reaction.

## 2023-05-16 ENCOUNTER — Encounter: Payer: Self-pay | Admitting: Family Medicine

## 2023-05-16 ENCOUNTER — Ambulatory Visit (INDEPENDENT_AMBULATORY_CARE_PROVIDER_SITE_OTHER): Payer: Medicare HMO | Admitting: Family Medicine

## 2023-05-16 VITALS — BP 136/86 | HR 100 | Temp 98.0°F | Resp 18 | Ht 62.0 in | Wt 169.1 lb

## 2023-05-16 DIAGNOSIS — M2550 Pain in unspecified joint: Secondary | ICD-10-CM

## 2023-05-16 DIAGNOSIS — D508 Other iron deficiency anemias: Secondary | ICD-10-CM

## 2023-05-16 DIAGNOSIS — R635 Abnormal weight gain: Secondary | ICD-10-CM

## 2023-05-16 DIAGNOSIS — R600 Localized edema: Secondary | ICD-10-CM | POA: Diagnosis not present

## 2023-05-16 DIAGNOSIS — Z1231 Encounter for screening mammogram for malignant neoplasm of breast: Secondary | ICD-10-CM

## 2023-05-16 NOTE — Progress Notes (Unsigned)
SUBJECTIVE:   Chief Complaint  Patient presents with  . Weight Gain   HPI Presents for acute visit  Discussed the use of AI scribe software for clinical note transcription with the patient, who gave verbal consent to proceed.  History of Present Illness The patient, with a history of hypertension, gastric bypass surgery, and menopause, presents with concerns of unexplained weight gain. She reports a steady increase in weight despite maintaining a healthy diet and regular vitamin intake. The weight gain is primarily localized in the lower body, with no noticeable changes in the face or upper body. The patient also reports a history of low salt levels, for which she has reduced her Cymbalta intake from 60mg  to 20mg  daily.  The patient has a history of bariatric surgery at age 33 and has maintained a stable weight until recently. She has not experienced significant weight gain during menopause or other life events. She has tried to revert to her post-bariatric surgery diet and has started taking a bariatric multivitamin and an additional liquid multivitamin.  The patient has also been experiencing dry mouth and dry skin on the legs. She has noticed that her hair is not growing as it used to. She reports frequent urination, especially at night, but sometimes feels that not all the consumed water is being excreted.  The patient has recently undergone a colonoscopy and endoscopy, both of which showed no significant findings. A CT scan revealed gallstones, but she has not experienced any related symptoms. She has also had an iron infusion due to low iron levels, and her most recent blood test showed slightly low iron levels.  The patient has been managing her back pain, which she attributes to arthritis. She has also noticed varicose veins around her knee, which she believes are due to joint pressure. She has been taking Flexeril and Lyrica for pain management.    PERTINENT PMH / PSH: As  above  OBJECTIVE:  BP 136/86   Pulse 100   Temp 98 F (36.7 C)   Resp 18   Ht 5\' 2"  (1.575 m)   Wt 169 lb 2 oz (76.7 kg)   SpO2 99%   BMI 30.93 kg/m    Physical Exam Vitals reviewed.  Constitutional:      General: She is not in acute distress.    Appearance: Normal appearance. She is normal weight. She is not ill-appearing, toxic-appearing or diaphoretic.  Eyes:     General:        Right eye: No discharge.        Left eye: No discharge.     Conjunctiva/sclera: Conjunctivae normal.  Cardiovascular:     Rate and Rhythm: Normal rate and regular rhythm.     Heart sounds: Normal heart sounds.  Pulmonary:     Effort: Pulmonary effort is normal.  Abdominal:     General: Bowel sounds are normal.  Musculoskeletal:        General: Swelling present. Normal range of motion.  Skin:    General: Skin is warm and dry.  Neurological:     General: No focal deficit present.     Mental Status: She is alert and oriented to person, place, and time. Mental status is at baseline.  Psychiatric:        Mood and Affect: Mood normal.        Behavior: Behavior normal.        Thought Content: Thought content normal.        Judgment: Judgment normal.  05/16/2023    3:03 PM 02/11/2023   11:57 AM 01/11/2023    2:14 PM 01/04/2023    4:58 PM 12/30/2022   10:49 AM  Depression screen PHQ 2/9  Decreased Interest 3 2   3   Down, Depressed, Hopeless 2 2   2   PHQ - 2 Score 5 4   5   Altered sleeping 3 3   3   Tired, decreased energy 1 2   2   Change in appetite 2 3   3   Feeling bad or failure about yourself  3 3   2   Trouble concentrating 0 0   0  Moving slowly or fidgety/restless 0 0   0  Suicidal thoughts 0 0   0  PHQ-9 Score 14 15   15   Difficult doing work/chores Very difficult Somewhat difficult   Somewhat difficult     Information is confidential and restricted. Go to Review Flowsheets to unlock data.      05/16/2023    3:04 PM 02/11/2023   11:57 AM 01/11/2023    2:15 PM 01/04/2023     4:59 PM  GAD 7 : Generalized Anxiety Score  Nervous, Anxious, on Edge 2 1    Control/stop worrying 3 2    Worry too much - different things 3 1    Trouble relaxing 0 0    Restless 0 0    Easily annoyed or irritable 2 1    Afraid - awful might happen 0 0    Total GAD 7 Score 10 5    Anxiety Difficulty Very difficult Somewhat difficult       Information is confidential and restricted. Go to Review Flowsheets to unlock data.    ASSESSMENT/PLAN:  Encounter for screening mammogram for malignant neoplasm of breast   Unexplained Weight Gain Patient with history of gastric bypass surgery reports significant weight gain despite dietary changes and increased water intake. No changes in facial appearance or upper body weight noted. Patient also reports dry mouth and slower hair growth. -Order full panel blood work to assess for potential causes including thyroid and cortisol levels. -Refer to endocrinologist for further evaluation.  Iron Deficiency Patient with history of gastric bypass surgery and recent iron infusion reports low iron levels on recent blood test. Currently taking a multivitamin with 9mg  of iron. -Offer Accrufer, a new iron supplement suitable for bariatric patients, for trial. -Continue monitoring iron levels.  Gallstones Patient reports gallstones identified on recent CT scan but is asymptomatic. -No immediate intervention required. Monitor for development of symptoms.    General Health Maintenance -Continue current vitamin regimen (bariatric multivitamin, Mama Ruth's liquid multivitamin, sublingual B12). -Collect urine sample to assess hydration status and kidney function. -Advise patient to limit fluid intake before bed to reduce nocturia.      PDMP reviewed  No follow-ups on file.  Dana Allan, MD

## 2023-05-16 NOTE — Patient Instructions (Addendum)
It was a pleasure meeting you today. Thank you for allowing me to take part in your health care.  Our goals for today as we discussed include:  We will get some labs today.  If they are abnormal or we need to do something about them, I will call you.  If they are normal, I will send you a message on MyChart (if it is active) or a letter in the mail.  If you don't hear from [place] in 2 weeks, please call the office at the number below.   Follow up with Endocrinology and Nephrology   This is a list of the screening recommended for you and due dates:  Health Maintenance  Topic Date Due   Zoster (Shingles) Vaccine (1 of 2) Never done   COVID-19 Vaccine (3 - Pfizer risk series) 02/09/2020   Mammogram  04/21/2023   Flu Shot  08/29/2023*   Medicare Annual Wellness Visit  08/18/2023   Pap with HPV screening  02/12/2024   DTaP/Tdap/Td vaccine (3 - Td or Tdap) 02/02/2027   Colon Cancer Screening  02/23/2030   Hepatitis C Screening  Completed   HIV Screening  Completed   HPV Vaccine  Aged Out  *Topic was postponed. The date shown is not the original due date.     Follow up as needed   If you have any questions or concerns, please do not hesitate to call the office at [phone].  I look forward to our next visit and until then take care and stay safe.  Regards,   Dana Allan, MD   [facility]

## 2023-05-17 LAB — COMPREHENSIVE METABOLIC PANEL
ALT: 36 U/L — ABNORMAL HIGH (ref 0–35)
AST: 39 U/L — ABNORMAL HIGH (ref 0–37)
Albumin: 4.3 g/dL (ref 3.5–5.2)
Alkaline Phosphatase: 119 U/L — ABNORMAL HIGH (ref 39–117)
BUN: 17 mg/dL (ref 6–23)
CO2: 27 meq/L (ref 19–32)
Calcium: 9.2 mg/dL (ref 8.4–10.5)
Chloride: 106 meq/L (ref 96–112)
Creatinine, Ser: 0.98 mg/dL (ref 0.40–1.20)
GFR: 64.6 mL/min (ref >60.00)
Glucose, Bld: 71 mg/dL (ref 70–99)
Potassium: 3.7 meq/L (ref 3.5–5.1)
Sodium: 142 meq/L (ref 135–145)
Total Bilirubin: 0.3 mg/dL (ref 0.2–1.2)
Total Protein: 7 g/dL (ref 6.0–8.3)

## 2023-05-17 LAB — CBC WITH DIFFERENTIAL/PLATELET
Basophils Absolute: 0.1 10*3/uL (ref 0.0–0.1)
Basophils Relative: 0.8 % (ref 0.0–3.0)
Eosinophils Absolute: 0.2 10*3/uL (ref 0.0–0.7)
Eosinophils Relative: 2.5 % (ref 0.0–5.0)
HCT: 38.7 % (ref 36.0–46.0)
Hemoglobin: 12.4 g/dL (ref 12.0–15.0)
Lymphocytes Relative: 20.6 % (ref 12.0–46.0)
Lymphs Abs: 1.3 10*3/uL (ref 0.7–4.0)
MCHC: 32 g/dL (ref 30.0–36.0)
MCV: 86.8 fL (ref 78.0–100.0)
Monocytes Absolute: 0.5 10*3/uL (ref 0.1–1.0)
Monocytes Relative: 7.4 % (ref 3.0–12.0)
Neutro Abs: 4.5 10*3/uL (ref 1.4–7.7)
Neutrophils Relative %: 68.7 % (ref 43.0–77.0)
Platelets: 423 10*3/uL — ABNORMAL HIGH (ref 150.0–400.0)
RBC: 4.46 Mil/uL (ref 3.87–5.11)
RDW: 14.8 % (ref 11.5–15.5)
WBC: 6.5 10*3/uL (ref 4.0–10.5)

## 2023-05-17 LAB — URINALYSIS, ROUTINE W REFLEX MICROSCOPIC
Bilirubin Urine: NEGATIVE
Hgb urine dipstick: NEGATIVE
Ketones, ur: NEGATIVE
Nitrite: NEGATIVE
RBC / HPF: NONE SEEN (ref 0–?)
Specific Gravity, Urine: 1.025 (ref 1.000–1.030)
Total Protein, Urine: NEGATIVE
Urine Glucose: NEGATIVE
Urobilinogen, UA: 0.2 (ref 0.0–1.0)
pH: 6 (ref 5.0–8.0)

## 2023-05-17 LAB — TSH: TSH: 2.69 u[IU]/mL (ref 0.35–5.50)

## 2023-05-23 DIAGNOSIS — R600 Localized edema: Secondary | ICD-10-CM | POA: Insufficient documentation

## 2023-05-23 NOTE — Assessment & Plan Note (Signed)
Patient reports chronic back pain and suspects arthritis in ribs. -Continue current pain management regimen (Flexeril, Lyrica, Cymbalta 20mg ).

## 2023-05-24 ENCOUNTER — Encounter: Payer: Self-pay | Admitting: Family Medicine

## 2023-05-24 DIAGNOSIS — R635 Abnormal weight gain: Secondary | ICD-10-CM | POA: Insufficient documentation

## 2023-05-24 NOTE — Assessment & Plan Note (Signed)
Patient with history of gastric bypass surgery reports significant weight gain despite dietary changes and increased water intake. No changes in facial appearance or upper body weight noted. Weight primarily in lower extremities.  Patient also reports dry mouth and slower hair growth. -Check TSH -Follow up with her endocrinologist for further evaluation.

## 2023-05-24 NOTE — Assessment & Plan Note (Signed)
Increasing weight gain and edema in lower extremities despite monitoring diet.   Will refer to vascular for evaluation for need for lymphatic compression

## 2023-05-24 NOTE — Assessment & Plan Note (Signed)
Patient with history of gastric bypass surgery and recent iron infusion reports low iron levels on recent blood test. Currently taking a multivitamin with 9mg  of iron. -Offer Accrufer, a new iron supplement suitable for bariatric patients, for trial. -Continue monitoring iron levels.

## 2023-05-27 ENCOUNTER — Other Ambulatory Visit: Payer: Self-pay

## 2023-05-27 DIAGNOSIS — M4325 Fusion of spine, thoracolumbar region: Secondary | ICD-10-CM

## 2023-05-27 DIAGNOSIS — Z981 Arthrodesis status: Secondary | ICD-10-CM

## 2023-05-27 DIAGNOSIS — M7918 Myalgia, other site: Secondary | ICD-10-CM

## 2023-05-27 NOTE — Telephone Encounter (Signed)
Messaged patient to inform her that we are unable to refill her flexeril at this time. As per Kennyth Arnold in July, she is not seeing the patient regularly. I suggested that she follow up with her PCP for refills.

## 2023-05-30 ENCOUNTER — Encounter: Payer: Self-pay | Admitting: Family Medicine

## 2023-05-31 DIAGNOSIS — M47814 Spondylosis without myelopathy or radiculopathy, thoracic region: Secondary | ICD-10-CM | POA: Diagnosis not present

## 2023-05-31 DIAGNOSIS — M5003 Cervical disc disorder with myelopathy, cervicothoracic region: Secondary | ICD-10-CM | POA: Diagnosis not present

## 2023-05-31 DIAGNOSIS — Z01818 Encounter for other preprocedural examination: Secondary | ICD-10-CM | POA: Diagnosis not present

## 2023-05-31 DIAGNOSIS — M5417 Radiculopathy, lumbosacral region: Secondary | ICD-10-CM | POA: Diagnosis not present

## 2023-05-31 DIAGNOSIS — M533 Sacrococcygeal disorders, not elsewhere classified: Secondary | ICD-10-CM | POA: Diagnosis not present

## 2023-06-02 ENCOUNTER — Other Ambulatory Visit: Payer: Self-pay | Admitting: Family Medicine

## 2023-06-02 DIAGNOSIS — Z1231 Encounter for screening mammogram for malignant neoplasm of breast: Secondary | ICD-10-CM

## 2023-06-08 ENCOUNTER — Telehealth: Payer: Self-pay | Admitting: "Endocrinology

## 2023-06-08 DIAGNOSIS — E222 Syndrome of inappropriate secretion of antidiuretic hormone: Secondary | ICD-10-CM

## 2023-06-08 DIAGNOSIS — E871 Hypo-osmolality and hyponatremia: Secondary | ICD-10-CM

## 2023-06-08 NOTE — Telephone Encounter (Signed)
 What labs will this pt need?

## 2023-06-08 NOTE — Telephone Encounter (Signed)
 Lab order mailed.

## 2023-06-08 NOTE — Telephone Encounter (Signed)
 Pt has scheduled for March.  Pt needs labs updated and mailed

## 2023-06-20 ENCOUNTER — Other Ambulatory Visit: Payer: Self-pay

## 2023-06-20 MED ORDER — METOPROLOL SUCCINATE ER 25 MG PO TB24: 25.0000 mg | ORAL_TABLET | Freq: Every day | ORAL | 3 refills | Status: DC

## 2023-06-20 NOTE — Telephone Encounter (Signed)
Called pt to see which pharmacy she uses.

## 2023-06-23 ENCOUNTER — Other Ambulatory Visit: Payer: Self-pay | Admitting: Family Medicine

## 2023-06-23 DIAGNOSIS — Z1231 Encounter for screening mammogram for malignant neoplasm of breast: Secondary | ICD-10-CM

## 2023-06-24 DIAGNOSIS — M533 Sacrococcygeal disorders, not elsewhere classified: Secondary | ICD-10-CM | POA: Diagnosis not present

## 2023-06-27 ENCOUNTER — Other Ambulatory Visit: Payer: Self-pay | Admitting: Family Medicine

## 2023-06-27 DIAGNOSIS — Z1231 Encounter for screening mammogram for malignant neoplasm of breast: Secondary | ICD-10-CM

## 2023-06-28 DIAGNOSIS — M5417 Radiculopathy, lumbosacral region: Secondary | ICD-10-CM | POA: Diagnosis not present

## 2023-06-28 DIAGNOSIS — M5003 Cervical disc disorder with myelopathy, cervicothoracic region: Secondary | ICD-10-CM | POA: Diagnosis not present

## 2023-06-28 DIAGNOSIS — M4802 Spinal stenosis, cervical region: Secondary | ICD-10-CM | POA: Diagnosis not present

## 2023-07-04 ENCOUNTER — Encounter (INDEPENDENT_AMBULATORY_CARE_PROVIDER_SITE_OTHER): Payer: Medicare HMO | Admitting: Nurse Practitioner

## 2023-07-05 DIAGNOSIS — Z1231 Encounter for screening mammogram for malignant neoplasm of breast: Secondary | ICD-10-CM

## 2023-07-07 DIAGNOSIS — M5417 Radiculopathy, lumbosacral region: Secondary | ICD-10-CM | POA: Diagnosis not present

## 2023-07-07 DIAGNOSIS — M533 Sacrococcygeal disorders, not elsewhere classified: Secondary | ICD-10-CM | POA: Diagnosis not present

## 2023-07-11 DIAGNOSIS — Z1231 Encounter for screening mammogram for malignant neoplasm of breast: Secondary | ICD-10-CM

## 2023-07-13 ENCOUNTER — Ambulatory Visit: Payer: Medicare HMO

## 2023-07-15 ENCOUNTER — Encounter (INDEPENDENT_AMBULATORY_CARE_PROVIDER_SITE_OTHER): Payer: Self-pay

## 2023-07-18 ENCOUNTER — Telehealth: Payer: Self-pay | Admitting: Family Medicine

## 2023-07-18 NOTE — Telephone Encounter (Signed)
Copied from CRM [id number]. Topic: Medicare AWV >> Jul 18, 2023 11:15 AM Payton Doughty wrote: Reason for CRM: Called LVM 07/18/2023 to schedule AWV. Please schedule office or virtual visits.  Verlee Rossetti; Care Guide Ambulatory Clinical Support [place] [facility] Direct Dial: [phone]

## 2023-07-29 DIAGNOSIS — M5459 Other low back pain: Secondary | ICD-10-CM | POA: Diagnosis not present

## 2023-08-02 DIAGNOSIS — M5459 Other low back pain: Secondary | ICD-10-CM | POA: Diagnosis not present

## 2023-08-04 DIAGNOSIS — M5459 Other low back pain: Secondary | ICD-10-CM | POA: Diagnosis not present

## 2023-08-11 DIAGNOSIS — M5459 Other low back pain: Secondary | ICD-10-CM | POA: Diagnosis not present

## 2023-08-23 ENCOUNTER — Encounter: Payer: Self-pay | Admitting: Oncology

## 2023-08-24 ENCOUNTER — Ambulatory Visit: Payer: Medicare HMO | Admitting: "Endocrinology

## 2023-08-24 NOTE — Telephone Encounter (Signed)
 Patient scheduled in May, please contact pt to r/s

## 2023-08-29 ENCOUNTER — Telehealth: Payer: Self-pay | Admitting: "Endocrinology

## 2023-08-29 NOTE — Telephone Encounter (Signed)
 LVM for pt to call office back to get scheduled

## 2023-08-29 NOTE — Telephone Encounter (Signed)
 Pt has requested an appt for weight gain, do you need any labs and if so can you put an order in and then I can get her scheduled?

## 2023-08-29 NOTE — Telephone Encounter (Signed)
 Pt is already a pt of yours

## 2023-09-05 DIAGNOSIS — R2689 Other abnormalities of gait and mobility: Secondary | ICD-10-CM | POA: Diagnosis not present

## 2023-09-05 DIAGNOSIS — R635 Abnormal weight gain: Secondary | ICD-10-CM | POA: Diagnosis not present

## 2023-09-05 DIAGNOSIS — G930 Cerebral cysts: Secondary | ICD-10-CM | POA: Diagnosis not present

## 2023-09-05 DIAGNOSIS — G609 Hereditary and idiopathic neuropathy, unspecified: Secondary | ICD-10-CM | POA: Diagnosis not present

## 2023-09-05 DIAGNOSIS — G5793 Unspecified mononeuropathy of bilateral lower limbs: Secondary | ICD-10-CM | POA: Diagnosis not present

## 2023-09-06 DIAGNOSIS — Z9884 Bariatric surgery status: Secondary | ICD-10-CM | POA: Diagnosis not present

## 2023-09-06 DIAGNOSIS — I10 Essential (primary) hypertension: Secondary | ICD-10-CM | POA: Diagnosis not present

## 2023-09-06 DIAGNOSIS — M51379 Other intervertebral disc degeneration, lumbosacral region without mention of lumbar back pain or lower extremity pain: Secondary | ICD-10-CM | POA: Diagnosis not present

## 2023-09-06 DIAGNOSIS — G894 Chronic pain syndrome: Secondary | ICD-10-CM | POA: Diagnosis not present

## 2023-09-06 DIAGNOSIS — M5412 Radiculopathy, cervical region: Secondary | ICD-10-CM | POA: Diagnosis not present

## 2023-09-06 DIAGNOSIS — E222 Syndrome of inappropriate secretion of antidiuretic hormone: Secondary | ICD-10-CM | POA: Diagnosis not present

## 2023-09-06 DIAGNOSIS — E871 Hypo-osmolality and hyponatremia: Secondary | ICD-10-CM | POA: Diagnosis not present

## 2023-09-06 DIAGNOSIS — D509 Iron deficiency anemia, unspecified: Secondary | ICD-10-CM | POA: Diagnosis not present

## 2023-09-07 LAB — COMPREHENSIVE METABOLIC PANEL WITH GFR
ALT: 21 IU/L (ref 0–32)
AST: 25 IU/L (ref 0–40)
Albumin: 4.6 g/dL (ref 3.8–4.9)
Alkaline Phosphatase: 118 IU/L (ref 44–121)
BUN/Creatinine Ratio: 16 (ref 9–23)
BUN: 12 mg/dL (ref 6–24)
Bilirubin Total: 0.3 mg/dL (ref 0.0–1.2)
CO2: 23 mmol/L (ref 20–29)
Calcium: 9.5 mg/dL (ref 8.7–10.2)
Chloride: 106 mmol/L (ref 96–106)
Creatinine, Ser: 0.74 mg/dL (ref 0.57–1.00)
Globulin, Total: 2.2 g/dL (ref 1.5–4.5)
Glucose: 91 mg/dL (ref 70–99)
Potassium: 4.3 mmol/L (ref 3.5–5.2)
Sodium: 144 mmol/L (ref 134–144)
Total Protein: 6.8 g/dL (ref 6.0–8.5)
eGFR: 95 mL/min/{1.73_m2} (ref >59)

## 2023-09-09 ENCOUNTER — Ambulatory Visit: Admitting: "Endocrinology

## 2023-09-13 ENCOUNTER — Ambulatory Visit: Payer: Self-pay

## 2023-09-13 NOTE — Telephone Encounter (Signed)
 Copied from CRM [id number]. Topic: Clinical - Red Word Triage >> Sep 13, 2023  9:59 AM Shelby Colon wrote: Red Word that prompted transfer to Nurse Triage: Patient stated she is currently in back pain, lighting feeling down right leg. Pain is 12/10. Patient stated she's experiencing brain fog.  Chief Complaint: back pain Symptoms: pain that goes down right leg Frequency: constant Pertinent Negatives: Patient denies fever, flank pain Disposition: [] ED /[] Urgent Care (no appt availability in office) / [x] Appointment(In office/virtual)/ []  [facility]/ [] Home Care/ [] Refused Recommended Disposition /[] [facility]/ []  Follow-up with PCP Additional Notes: per protocol apt made; care advice given, denies questions; instructed to go to ER if becomes worse.   Reason for Disposition  [1] SEVERE back pain (e.g., excruciating, unable to do any normal activities) AND [2] not improved 2 hours after pain medicine  Answer Assessment - Initial Assessment Questions 1. ONSET: "When did the pain begin?"      Two days ago 2. LOCATION: "Where does it hurt?" (upper, mid or lower back)     Back pain from should blades to the ribs and down right leg and pain in right knee 3. SEVERITY: "How bad is the pain?"  (e.g., Scale 1-10; mild, moderate, or severe)   - MILD (1-3): Doesn't interfere with normal activities.    - MODERATE (4-7): Interferes with normal activities or awakens from sleep.    - SEVERE (8-10): Excruciating pain, unable to do any normal activities.      12/10 4. PATTERN: "Is the pain constant?" (e.g., yes, no; constant, intermittent)      constant 5. RADIATION: "Does the pain shoot into your legs or somewhere else?"     Down right leg 6. CAUSE:  "What do you think is causing the back pain?"      unknown 7. BACK OVERUSE:  "Any recent lifting of heavy objects, strenuous work or exercise?"     no 8. MEDICINES: "What have you taken so far for the pain?" (e.g., nothing, acetaminophen,  NSAIDS)     Tylenol and advil 9. NEUROLOGIC SYMPTOMS: "Do you have any weakness, numbness, or problems with bowel/bladder control?"     Brain fog, tingling down right leg 10. OTHER SYMPTOMS: "Do you have any other symptoms?" (e.g., fever, abdomen pain, burning with urination, blood in urine)       denies 11. PREGNANCY: "Is there any chance you are pregnant?" "When was your last menstrual period?"       na  Protocols used: Back Pain-A-AH

## 2023-09-14 ENCOUNTER — Ambulatory Visit: Admitting: Family Medicine

## 2023-09-15 ENCOUNTER — Ambulatory Visit: Admitting: Family Medicine

## 2023-09-20 ENCOUNTER — Other Ambulatory Visit: Payer: Self-pay | Admitting: Family Medicine

## 2023-10-06 DIAGNOSIS — I10 Essential (primary) hypertension: Secondary | ICD-10-CM | POA: Diagnosis not present

## 2023-10-06 DIAGNOSIS — R079 Chest pain, unspecified: Secondary | ICD-10-CM | POA: Diagnosis not present

## 2023-10-06 DIAGNOSIS — R002 Palpitations: Secondary | ICD-10-CM | POA: Diagnosis not present

## 2023-10-06 DIAGNOSIS — Z79899 Other long term (current) drug therapy: Secondary | ICD-10-CM | POA: Diagnosis not present

## 2023-10-06 DIAGNOSIS — Z7901 Long term (current) use of anticoagulants: Secondary | ICD-10-CM | POA: Diagnosis not present

## 2023-10-06 DIAGNOSIS — Z885 Allergy status to narcotic agent status: Secondary | ICD-10-CM | POA: Diagnosis not present

## 2023-10-06 DIAGNOSIS — R0789 Other chest pain: Secondary | ICD-10-CM | POA: Diagnosis not present

## 2023-10-06 DIAGNOSIS — I480 Paroxysmal atrial fibrillation: Secondary | ICD-10-CM | POA: Diagnosis not present

## 2023-10-06 DIAGNOSIS — D649 Anemia, unspecified: Secondary | ICD-10-CM | POA: Diagnosis not present

## 2023-10-07 DIAGNOSIS — Z905 Acquired absence of kidney: Secondary | ICD-10-CM | POA: Diagnosis not present

## 2023-10-07 DIAGNOSIS — G8929 Other chronic pain: Secondary | ICD-10-CM | POA: Diagnosis not present

## 2023-10-07 DIAGNOSIS — M62838 Other muscle spasm: Secondary | ICD-10-CM | POA: Diagnosis not present

## 2023-10-07 DIAGNOSIS — M5416 Radiculopathy, lumbar region: Secondary | ICD-10-CM | POA: Diagnosis not present

## 2023-10-07 DIAGNOSIS — R109 Unspecified abdominal pain: Secondary | ICD-10-CM | POA: Diagnosis not present

## 2023-10-07 DIAGNOSIS — I480 Paroxysmal atrial fibrillation: Secondary | ICD-10-CM | POA: Diagnosis not present

## 2023-10-07 DIAGNOSIS — M545 Low back pain, unspecified: Secondary | ICD-10-CM | POA: Diagnosis not present

## 2023-10-07 DIAGNOSIS — M542 Cervicalgia: Secondary | ICD-10-CM | POA: Diagnosis not present

## 2023-10-08 ENCOUNTER — Encounter: Payer: Self-pay | Admitting: Cardiology

## 2023-10-14 NOTE — Telephone Encounter (Signed)
 Scheduled 10/31/23 at 10:20am

## 2023-10-18 ENCOUNTER — Encounter (INDEPENDENT_AMBULATORY_CARE_PROVIDER_SITE_OTHER): Payer: Self-pay

## 2023-10-20 ENCOUNTER — Encounter: Payer: Self-pay | Admitting: "Endocrinology

## 2023-10-20 ENCOUNTER — Telehealth (INDEPENDENT_AMBULATORY_CARE_PROVIDER_SITE_OTHER): Admitting: "Endocrinology

## 2023-10-20 VITALS — BP 107/75 | HR 87 | Ht 62.0 in | Wt 160.0 lb

## 2023-10-20 DIAGNOSIS — E871 Hypo-osmolality and hyponatremia: Secondary | ICD-10-CM

## 2023-10-20 DIAGNOSIS — E222 Syndrome of inappropriate secretion of antidiuretic hormone: Secondary | ICD-10-CM

## 2023-10-20 NOTE — Progress Notes (Signed)
 10/20/2023, 4:49 PM   Endocrinology follow-up note  Subjective:    Patient ID: Shelby Colon, female    DOB: 04-26-1966, PCP Valli Gaw, MD   Past Medical History:  Diagnosis Date   Abnormal MRI, cervical spine 10/17/2019   FINDINGS: Alignment: Reversal of the normal cervical lordosis with apex at C3-4. Trace retrolisthesis of C4 on C5, C5 on C6, and C6 on C7, with anterolisthesis of C7 on T1. Findings chronic and facet mediated.   Vertebrae: Vertebral body height maintained without evidence for acute or chronic fracture. Bone marrow signal intensity within normal limits. No discrete or worrisome osseous lesions. Pro   Abnormal Pap smear of cervix    h/o LSIL pap with HPV +; 01/2019 pap negative negative HPV   Alcohol use 02/09/2022   Altered mental status 12/08/2021   Altered mental status 12/08/2021   Annual physical exam 06/12/2020   Anxiety    Anxiety 06/12/2020   Anxiety and depression 11/30/2017   Atrial fibrillation (HCC)    Atrophy of right kidney 03/05/2019   Left kidney ok   B12 deficiency    Benign neoplasm of ascending colon    Brain cyst 12/10/2021   Cardiac murmur 02/14/2018   as a child   Chronic knee pain (Bilateral) 09/07/2018   09/21/2018 left total knee [facility]o [facility]    Chronic pain syndrome    Colon polyps    Confusion 12/10/2021   Congenital absence of right kidney    Degeneration of lumbar intervertebral disc    Back surgery, left leg knee down numb and tingling   Depression    Early menopause    Elevated alkaline phosphatase level 02/23/2019   Elevated BP without diagnosis of hypertension 08/04/2021   Last Assessment & Plan: Formatting of this note might be different from the original. BP at last visit was 131/85 and today is 156/88.  Advised patient to come back within the next 2 to 4 weeks to recheck her blood pressure.  Goal BP is less than 140/90.   Gallstones     Gastric bypass status for obesity    2013, weight 258lb   Grade I diastolic dysfunction    Headache    History of   Heart murmur    MVP   History of chicken pox    HPV in female 11/30/2017   Hypercholesterolemia    Iron  deficiency    Iron  deficiency anemia 03/02/2019   Mild mitral regurgitation by prior echocardiogram    Mild tricuspid regurgitation by prior echocardiogram    Neuromuscular disorder (HCC)    Neuropathy    Paroxysmal SVT (supraventricular tachycardia) (HCC)    Personal history of colonic polyps    Pharmacologic therapy 10/17/2019   Polyp of sigmoid colon    Problems influencing health status 10/17/2019   PTSD (post-traumatic stress disorder)    Rheumatoid arthritis (HCC)    as child    Scoliosis of lumbar spine    SIADH (syndrome of inappropriate ADH production) (HCC) 01/01/2022   Solitary kidney, congenital    pt thinks has left kidney only no right kidney   Thrombocytosis 02/23/2019   Thyroid  disease    in the past    Transaminitis 12/08/2021  UTI (urinary tract infection)    Vitamin D  deficiency    Wears partial dentures    bottom   Past Surgical History:  Procedure Laterality Date   ANTERIOR CERVICAL DECOMPRESSION/DISCECTOMY FUSION 4 LEVELS N/A 11/28/2019   Procedure: ANTERIOR CERVICAL DECOMPRESSION/DISCECTOMY FUSION 4 LEVELS C3-7;  Surgeon: Jodeen Munch, MD;  Location: [facility] ORS;  Service: Neurosurgery;  Laterality: N/A;   AUGMENTATION MAMMAPLASTY Bilateral 2009   BACK SURGERY  11/04/2014   fusion of spine of lumbosacral region 2 rods and 16 screws    BREAST ENHANCEMENT SURGERY     COLONOSCOPY WITH PROPOFOL  N/A 01/06/2018   Procedure: COLONOSCOPY WITH PROPOFOL ;  Surgeon: Irby Mannan, MD;  Location: [facility] ENDOSCOPY;  Service: Endoscopy;  Laterality: N/A;   COLONOSCOPY WITH PROPOFOL  N/A 03/19/2019   Procedure: COLONOSCOPY WITH PROPOFOL ;  Surgeon: Irby Mannan, MD;  Location: [facility] ENDOSCOPY;  Service: Endoscopy;  Laterality:  N/A;   COLONOSCOPY WITH PROPOFOL  N/A 02/24/2023   Procedure: COLONOSCOPY WITH PROPOFOL ;  Surgeon: Selena Daily, MD;  Location: [facility];  Service: Endoscopy;  Laterality: N/A;   COSMETIC SURGERY     ESOPHAGOGASTRODUODENOSCOPY (EGD) WITH PROPOFOL  N/A 02/24/2023   Procedure: ESOPHAGOGASTRODUODENOSCOPY (EGD) WITH PROPOFOL ;  Surgeon: Selena Daily, MD;  Location: [facility];  Service: Endoscopy;  Laterality: N/A;   excess skin removal     JOINT REPLACEMENT  09/20/2008   both knees done right was last year and left just done this   KNEE ARTHROSCOPY Bilateral    x 7 knees b/l (2 surgeries on right knee) total 1 bone graft and 5 arthroscopy total knee    POLYPECTOMY  02/24/2023   Procedure: POLYPECTOMY;  Surgeon: Selena Daily, MD;  Location: [facility];  Service: Endoscopy;;   ROUX-EN-Y PROCEDURE     SPINE SURGERY  11/07/2014   2 rods and 16 screws   TOTAL KNEE ARTHROPLASTY     left 09/21/18 Dr. Marshal Skeens II Antoinette Kirschner   TOTAL KNEE ARTHROPLASTY     right knee Dr. Melonie Square    Social History   Socioeconomic History   Marital status: Married    Spouse name: alex   Number of children: 0   Years of education: 16   Highest education level: Bachelor's degree (e.g., BA, AB, BS)  Occupational History   Not on file  Tobacco Use   Smoking status: Never   Smokeless tobacco: Never   Tobacco comments:    Never smoked  Vaping Use   Vaping status: Never Used  Substance and Sexual Activity   Alcohol use: Not Currently    Alcohol/week: 1.0 standard drink of alcohol    Types: 1 Glasses of wine per week    Comment: SOCIALLY   Drug use: Never   Sexual activity: Not Currently  Other Topics Concern   Not on file  Social History Narrative   Married since 2012    No kids    adopted   From [place] moved to [facility] and now lives here    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/2018    BA degree    No guns, wears seat belt, safe in relationship    Social Drivers of Health   Financial Resource Strain: Medium Risk (05/15/2023)   Overall Financial Resource Strain (CARDIA)    Difficulty of Paying Living Expenses: Somewhat hard  Food Insecurity: Food Insecurity Present (05/15/2023)   Hunger Vital Sign    Worried About  Running Out of Food in the Last Year: Often true    Ran Out of Food in the Last Year: Sometimes true  Transportation Needs: No Transportation Needs (05/15/2023)   PRAPARE - Administrator, Civil Service (Medical): No    Lack of Transportation (Non-Medical): No  Physical Activity: Insufficiently Active (05/15/2023)   Exercise Vital Sign    Days of Exercise per Week: 2 days    Minutes of Exercise per Session: 30 min  Stress: Stress Concern Present (05/15/2023)   [employer] of Occupational Health - Occupational Stress Questionnaire    Feeling of Stress : Very much  Social Connections: Moderately Isolated (05/15/2023)   Social Connection and Isolation Panel [NHANES]    Frequency of Communication with Friends and Family: More than three times a week    Frequency of Social Gatherings with Friends and Family: Never    Attends Religious Services: Never    Database administrator or Organizations: No    Attends Engineer, structural: Not on file    Marital Status: Married   Family History  Adopted: Yes  Problem Relation Age of Onset   Arthritis Mother    Drug abuse Mother        heriod OD   Early death Mother    Diabetes Father    Hypertension Father    Depression Father    Arthritis Father    Drug abuse Father    Diabetes Sister    Fibromyalgia Sister    Rashes / Skin problems Sister        PSORIASIS   Diabetes Brother    Osteoarthritis Maternal Grandmother    Aneurysm Maternal Aunt    Thyroid  disease Maternal Aunt    Fibromyalgia Maternal Aunt    Breast cancer Maternal Aunt    Cancer Maternal Aunt         breast cancer    Outpatient Encounter Medications as of 10/20/2023  Medication Sig   Cyanocobalamin  (B-12) 1000 MCG SUBL Place 1 tablet under the tongue daily.   cyclobenzaprine  (FLEXERIL ) 10 MG tablet Take 1 tablet (10 mg total) by mouth 3 (three) times daily as needed for muscle spasms. This can make you sleepy.   fluticasone  (FLONASE ) 50 MCG/ACT nasal spray Place 2 sprays into both nostrils daily. (Patient taking differently: Place 2 sprays into both nostrils as needed.)   metoprolol  succinate (TOPROL -XL) 25 MG 24 hr tablet TAKE 1 TABLET BY MOUTH DAILY   pregabalin  (LYRICA ) 50 MG capsule TAKE ONE CAPSULE BY MOUTH TWICE A DAY AND TAKE ONE CAPSULE BY MOUTH AT BEDTIME   VITAMIN D -VITAMIN K PO Take by mouth.   No facility-administered encounter medications on file as of 10/20/2023.   ALLERGIES: Allergies  Allergen Reactions   Morphine Nausea And Vomiting    VACCINATION STATUS: Immunization History  Administered Date(s) Administered   Hepb-cpg 08/18/2022, 09/16/2022   Influenza, Quadrivalent, Recombinant, Inj, Pf 04/14/2019   Influenza,inj,Quad PF,6+ Mos 06/10/2020   Influenza-Unspecified 04/14/2019   PFIZER(Purple Top)SARS-COV-2 Vaccination 12/22/2019, 01/12/2020   Td 02/01/2017   Tdap 02/01/2017    HPI Shelby Colon is 58 y.o. female who could not come to clinic due to her travel to [place]  to help her mother.  This visit was done through video conversation through MyChart.  She was at her home in [place]  and I was in my office in [place].    She was recently hospitalized for altered mental status when she was found to have severe hyponatremia.  She  was also found to have clear electrolyte abnormalities including her hyperosmolality. She has responded to IV fluids as well as oral salt administration with subsequent stabilization of her electrolytes. With a presumptive diagnosis of hyponatremia secondary to SIADH, water  striction was advised during her first encounter in  this clinic.  She continues to follow this recommendation and her most recent labs are favorable with sodium at 144, normal renal function.  She maintains 1-1.5 L of water  daily. She is now down to only social drinking of alcohol. She denies medical problems such as cirrhosis, CHF, nor kidney disease. She is on Cymbalta  and pregabalin . This patient underwent gastric surgery in the past when she weighed up to 250 pounds, currently 160 pounds, regaining in most recent weeks.  Her MRI of the brain showed stable appearance of right middle cranial fossa arachnoid cyst measuring 2.6 cm.  A few nonspecific punctate foci of T2 hyperintensity within the white matter of the cerebral hemispheres.  Inflammatory or infectious processes or demyelinating diseases was not excluded.  She never had problems with her sodium.  She denies any prior history of adrenal, thyroid , pituitary dysfunctions.  Review of Systems  Constitutional: + Progressive weight gain,  history of bariatric surgery which allowed her to lose approximately 100 pounds,  no fatigue, no subjective hyperthermia, no subjective hypothermia   Objective:       10/20/2023    2:24 PM 05/16/2023    2:51 PM 04/19/2023    1:39 PM  Vitals with BMI  Height 5\' 2"  5\' 2"    Weight 160 lbs 169 lbs 2 oz   BMI 29.26 30.93   Systolic 107 136 034  Diastolic 75 86 79  Pulse 87 100 87    BP 107/75   Pulse 87   Ht 5\' 2"  (1.575 m)   Wt 160 lb (72.6 kg)   BMI 29.26 kg/m   Wt Readings from Last 3 Encounters:  10/20/23 160 lb (72.6 kg)  05/16/23 169 lb 2 oz (76.7 kg)  04/19/23 169 lb 1.6 oz (76.7 kg)    Physical Exam  Constitutional:  Body mass index is 29.26 kg/m.,  not in acute distress, normal state of mind  CMP ( most recent) CMP     Component Value Date/Time   NA 144 09/06/2023 0929   K 4.3 09/06/2023 0929   CL 106 09/06/2023 0929   CO2 23 09/06/2023 0929   GLUCOSE 91 09/06/2023 0929   GLUCOSE 71 05/16/2023 1552   BUN 12 09/06/2023  0929   CREATININE 0.74 09/06/2023 0929   CREATININE 0.74 12/05/2017 1044   CALCIUM 9.5 09/06/2023 0929   PROT 6.8 09/06/2023 0929   ALBUMIN 4.6 09/06/2023 0929   AST 25 09/06/2023 0929   ALT 21 09/06/2023 0929   ALKPHOS 118 09/06/2023 0929   BILITOT 0.3 09/06/2023 0929   GFRNONAA >60 02/10/2022 0448   GFRAA >60 11/21/2019 0907     Diabetic Labs (most recent): Lab Results  Component Value Date   HGBA1C 5.2 02/13/2018     Lipid Panel ( most recent) Lipid Panel     Component Value Date/Time   CHOL 184 02/15/2019 1004   TRIG 55 02/15/2019 1004   HDL 117 02/15/2019 1004   CHOLHDL 1.6 02/15/2019 1004   CHOLHDL 1.8 12/05/2017 1044   LDLCALC 56 02/15/2019 1004   LDLCALC 74 12/05/2017 1044   LABVLDL 11 02/15/2019 1004      Lab Results  Component Value Date   TSH 2.69 05/16/2023  TSH 3.135 02/09/2022   TSH 3.817 12/08/2021   TSH 2.890 02/15/2019       Assessment & Plan:   1. Hyponatremia 2. SIADH (syndrome of inappropriate ADH production) (HCC)   - I have reviewed her available  records and communicated with the patient via MyChart video. - Based on these reviews, her previous hyponatremia has largely resolved.   She has no acute complaints today. Review of her records indicate a possibility of SIADH. This could be of multiple etiology including alcohol, Cymbalta , idiopathic.  She is advised to continue on on fluid restrictions not to exceed  2 L a day.  She is allowed to drink 1 L of water  per day and another  liter of  other  fluids. She is advised to wean off of alcohol completely. Her more recent a.m. cortisol is normal.  She would not need intervention with steroids for now. -She is encouraged to maintain her visit with a neurologist for better explanation of her MRI findings. -She will have repeat labs before her next visit including CMP, a.m. cortisol, thyroid  function test.  - she is advised to maintain close follow up with Valli Gaw, MD for primary  care needs.  I spent  21  minutes in the care of the patient today including review of labs from Thyroid  Function, CMP, and other relevant labs ; imaging/biopsy records (current and previous including abstractions from other facilities); video visit discussing  her lab results and symptoms,  and documenting the encounter.  [name] , via MyChart video,  participated in the discussions, expressed understanding, and voiced agreement with the above plans.  All questions were answered to her satisfaction. she is encouraged to contact clinic should she have any questions or concerns prior to her return visit.   Follow up plan: Return in about 5 months (around 03/21/2024) for Fasting Labs  in AM B4 8.   Kalvin Orf, MD [facility] [address] [place], [place] [address] Phone: [phone]  Fax: [phone]     10/20/2023, 4:49 PM  This note was partially dictated with voice recognition software. Similar sounding words can be transcribed inadequately or may not  be corrected upon review.

## 2023-10-21 ENCOUNTER — Other Ambulatory Visit: Payer: Medicare HMO

## 2023-10-25 ENCOUNTER — Ambulatory Visit: Payer: Medicare HMO

## 2023-10-25 ENCOUNTER — Ambulatory Visit: Payer: Medicare HMO | Admitting: Oncology

## 2023-10-31 ENCOUNTER — Encounter: Payer: Self-pay | Admitting: Cardiology

## 2023-10-31 ENCOUNTER — Ambulatory Visit: Attending: Cardiology | Admitting: Cardiology

## 2023-10-31 ENCOUNTER — Ambulatory Visit

## 2023-10-31 VITALS — BP 176/87 | HR 86 | Ht 62.0 in | Wt 160.4 lb

## 2023-10-31 DIAGNOSIS — Q2381 Bicuspid aortic valve: Secondary | ICD-10-CM

## 2023-10-31 DIAGNOSIS — I480 Paroxysmal atrial fibrillation: Secondary | ICD-10-CM | POA: Diagnosis not present

## 2023-10-31 DIAGNOSIS — I340 Nonrheumatic mitral (valve) insufficiency: Secondary | ICD-10-CM | POA: Diagnosis not present

## 2023-10-31 MED ORDER — METOPROLOL SUCCINATE ER 50 MG PO TB24: 50.0000 mg | ORAL_TABLET | Freq: Every day | ORAL | 3 refills | Status: DC

## 2023-10-31 NOTE — Patient Instructions (Addendum)
 Medication Instructions:  Increase Metoprolol  Succinate 50 mg take one tablet daily.   *If you need a refill on your cardiac medications before your next appointment, please call your pharmacy*  Lab Work: NO LABS   If you have labs (blood work) drawn today and your tests are completely normal, you will receive your results only by: MyChart Message (if you have MyChart) OR A paper copy in the mail If you have any lab test that is abnormal or we need to change your treatment, we will call you to review the results.  Testing/Procedures:  Your physician has requested that you have an echocardiogram. Echocardiography is a painless test that uses sound waves to create images of your heart. It provides your doctor with information about the size and shape of your heart and how well your heart's chambers and valves are working.   You may receive an ultrasound enhancing agent through an IV if needed to better visualize your heart during the echo. This procedure takes approximately one hour.  There are no restrictions for this procedure.  This will take place at [address] [facility] [address], [place] [address]  Please note: We ask at that you not bring children with you during ultrasound (echo/ vascular) testing. Due to room size and safety concerns, children are not allowed in the ultrasound rooms during exams. Our front office staff cannot provide observation of children in our lobby area while testing is being conducted. An adult accompanying a patient to their appointment will only be allowed in the ultrasound room at the discretion of the ultrasound technician under special circumstances. We apologize for any inconvenience.    Your physician has recommended that you wear a Zio monitor.   This monitor is a medical device that records the heart's electrical activity. Doctors most often use these monitors to diagnose arrhythmias. Arrhythmias are problems with the speed or rhythm  of the heartbeat. The monitor is a small device applied to your chest. You can wear one while you do your normal daily activities. While wearing this monitor if you have any symptoms to push the button and record what you felt. Once you have worn this monitor for the period of time provider prescribed (Usually 14 days), you will return the monitor device in the postage paid box. Once it is returned they will download the data collected and provide us  with a report which the provider will then review and we will call you with those results. Important tips:  Avoid showering during the first 24 hours of wearing the monitor. Avoid excessive sweating to help maximize wear time. Do not submerge the device, no hot tubs, and no swimming pools. Keep any lotions or oils away from the patch. After 24 hours you may shower with the patch on. Take brief showers with your back facing the shower head.  Do not remove patch once it has been placed because that will interrupt data and decrease adhesive wear time. Push the button when you have any symptoms and write down what you were feeling. Once you have completed wearing your monitor, remove and place into box which has postage paid and place in your outgoing mailbox.  If for some reason you have misplaced your box then call our office and we can provide another box and/or mail it off for you.   Follow-Up: At [facility], you and your health needs are our priority.  As part of our continuing mission to provide you with exceptional  heart care, our providers are all part of one team.  This team includes your primary Cardiologist (physician) and Advanced Practice Providers or APPs (Physician Assistants and Nurse Practitioners) who all work together to provide you with the care you need, when you need it.  Your next appointment:   6 week(s)  Provider:   You will see Dr.Agbor-Etang or one of the following Advanced Practice Providers on your designated Care  Team:   Laneta Pintos, NP Gildardo Labrador, PA-C Varney Gentleman, PA-C Cadence Gennaro Khat, PA-C Ronald Cockayne, NP Morey Ar, NP  Referral for EP with Dr. Arturo Late or Dr. Daneil Dunker   We recommend signing up for the patient portal called "MyChart".  Sign up information is provided on this After Visit Summary.  MyChart is used to connect with patients for Virtual Visits (Telemedicine).  Patients are able to view lab/test results, encounter notes, upcoming appointments, etc.  Non-urgent messages can be sent to your provider as well.   To learn more about what you can do with MyChart, go to [email or web address].

## 2023-10-31 NOTE — Progress Notes (Signed)
 Cardiology Office Note:    Date:  10/31/2023   ID:  Shelby Colon, DOB January 20, 1966, MRN [id number]  PCP:  Valli Gaw, MD   [facility] Providers Cardiologist:  Constancia Delton, MD     Referring MD: Valli Gaw, MD   Chief Complaint  Patient presents with   Follow-up    Patient was admitted to [facility] center and suffered an A-FIB episode, she was in the hospital for 2 days.  Patient c/o shortness of breath, racing heart beats with hearing her pulse in ears & throat, dizziness with feeling faint and chest discomfort since she was discharged from the hospital on Oct 06, 2023.        History of Present Illness:    Shelby Colon is a 58 y.o. female with a hx of anxiety, paroxysmal atrial fibrillation, chronic back pain who presents due to atrial fibrillation.  Patient visited [place]  to help her mother who has early onset dementia.  She developed palpitations suddenly, associated with dizziness, faint feeling.  Symptoms lasting several minutes.  She checked her heart rate with her smart watch which ranges 176 bpm.  She alerted her husband who took her to the ED.  In the ED, EKG showed A-fib with rapid ventricular response heart rate 160s.  She was monitored in the ED/hospital for about 2 days.  She spontaneously converted to sinus rhythm.  Eliquis was prescribed upon discharge, patient took Eliquis twice while in the hospital.  Has not picked up Eliquis from pharmacy due to cost.  Currently takes aspirin 81 mg daily.  Previously seen for paroxysmal SVTs, cardiac monitor showed no A-fib.  Started on Toprol -XL 25 mg daily with good effect.  Prior notes Echo reviewed by myself 02/2022 EF 60 to 65%, bicuspid aortic valve, mild MR, Cardiac monitor 02/2022 paroxysmal SVTs, PACs.  Past Medical History:  Diagnosis Date   Abnormal MRI, cervical spine 10/17/2019   FINDINGS: Alignment: Reversal of the normal cervical lordosis with apex at C3-4. Trace retrolisthesis of C4 on  C5, C5 on C6, and C6 on C7, with anterolisthesis of C7 on T1. Findings chronic and facet mediated.   Vertebrae: Vertebral body height maintained without evidence for acute or chronic fracture. Bone marrow signal intensity within normal limits. No discrete or worrisome osseous lesions. Pro   Abnormal Pap smear of cervix    h/o LSIL pap with HPV +; 01/2019 pap negative negative HPV   Alcohol use 02/09/2022   Altered mental status 12/08/2021   Altered mental status 12/08/2021   Annual physical exam 06/12/2020   Anxiety    Anxiety 06/12/2020   Anxiety and depression 11/30/2017   Atrial fibrillation (HCC)    Atrophy of right kidney 03/05/2019   Left kidney ok   B12 deficiency    Benign neoplasm of ascending colon    Brain cyst 12/10/2021   Cardiac murmur 02/14/2018   as a child   Chronic knee pain (Bilateral) 09/07/2018   09/21/2018 left total knee ortho [facility] OP    Chronic pain syndrome    Colon polyps    Confusion 12/10/2021   Congenital absence of right kidney    Degeneration of lumbar intervertebral disc    Back surgery, left leg knee down numb and tingling   Depression    Early menopause    Elevated alkaline phosphatase level 02/23/2019   Elevated BP without diagnosis of hypertension 08/04/2021   Last Assessment & Plan: Formatting of this note might be  different from the original. BP at last visit was 131/85 and today is 156/88.  Advised patient to come back within the next 2 to 4 weeks to recheck her blood pressure.  Goal BP is less than 140/90.   Gallstones    Gastric bypass status for obesity    2013, weight 258lb   Grade I diastolic dysfunction    Headache    History of   Heart murmur    MVP   History of chicken pox    HPV in female 11/30/2017   Hypercholesterolemia    Iron  deficiency    Iron  deficiency anemia 03/02/2019   Mild mitral regurgitation by prior echocardiogram    Mild tricuspid regurgitation by prior echocardiogram     Neuromuscular disorder (HCC)    Neuropathy    Paroxysmal SVT (supraventricular tachycardia) (HCC)    Personal history of colonic polyps    Pharmacologic therapy 10/17/2019   Polyp of sigmoid colon    Problems influencing health status 10/17/2019   PTSD (post-traumatic stress disorder)    Rheumatoid arthritis (HCC)    as child    Scoliosis of lumbar spine    SIADH (syndrome of inappropriate ADH production) (HCC) 01/01/2022   Solitary kidney, congenital    pt thinks has left kidney only no right kidney   Thrombocytosis 02/23/2019   Thyroid  disease    in the past    Transaminitis 12/08/2021   UTI (urinary tract infection)    Vitamin D  deficiency    Wears partial dentures    bottom    Past Surgical History:  Procedure Laterality Date   ANTERIOR CERVICAL DECOMPRESSION/DISCECTOMY FUSION 4 LEVELS N/A 11/28/2019   Procedure: ANTERIOR CERVICAL DECOMPRESSION/DISCECTOMY FUSION 4 LEVELS C3-7;  Surgeon: Jodeen Munch, MD;  Location: [facility] ORS;  Service: Neurosurgery;  Laterality: N/A;   AUGMENTATION MAMMAPLASTY Bilateral 2009   BACK SURGERY  11/04/2014   fusion of spine of lumbosacral region 2 rods and 16 screws    BREAST ENHANCEMENT SURGERY     COLONOSCOPY WITH PROPOFOL  N/A 01/06/2018   Procedure: COLONOSCOPY WITH PROPOFOL ;  Surgeon: Irby Mannan, MD;  Location: [facility] ENDOSCOPY;  Service: Endoscopy;  Laterality: N/A;   COLONOSCOPY WITH PROPOFOL  N/A 03/19/2019   Procedure: COLONOSCOPY WITH PROPOFOL ;  Surgeon: Irby Mannan, MD;  Location: [facility] ENDOSCOPY;  Service: Endoscopy;  Laterality: N/A;   COLONOSCOPY WITH PROPOFOL  N/A 02/24/2023   Procedure: COLONOSCOPY WITH PROPOFOL ;  Surgeon: Selena Daily, MD;  Location: [facility];  Service: Endoscopy;  Laterality: N/A;   COSMETIC SURGERY     ESOPHAGOGASTRODUODENOSCOPY (EGD) WITH PROPOFOL  N/A 02/24/2023   Procedure: ESOPHAGOGASTRODUODENOSCOPY (EGD) WITH PROPOFOL ;  Surgeon: Selena Daily, MD;  Location: [facility];  Service: Endoscopy;  Laterality: N/A;   excess skin removal     JOINT REPLACEMENT  09/20/2008   both knees done right was last year and left just done this   KNEE ARTHROSCOPY Bilateral    x 7 knees b/l (2 surgeries on right knee) total 1 bone graft and 5 arthroscopy total knee    POLYPECTOMY  02/24/2023   Procedure: POLYPECTOMY;  Surgeon: Selena Daily, MD;  Location: [facility];  Service: Endoscopy;;   ROUX-EN-Y PROCEDURE     SPINE SURGERY  11/07/2014   2 rods and 16 screws   TOTAL KNEE ARTHROPLASTY     left 09/21/18 Dr. Marshal Skeens II Antoinette Kirschner   TOTAL KNEE ARTHROPLASTY     right knee Dr. Melonie Square     Current  Medications: Current Meds  Medication Sig   aspirin EC (ASPIRIN 81) 81 MG tablet Take 81 mg by mouth daily. Swallow whole.   Cyanocobalamin  (B-12) 1000 MCG SUBL Place 1 tablet under the tongue daily.   cyclobenzaprine  (FLEXERIL ) 10 MG tablet Take 1 tablet (10 mg total) by mouth 3 (three) times daily as needed for muscle spasms. This can make you sleepy.   fluticasone  (FLONASE ) 50 MCG/ACT nasal spray Place 2 sprays into both nostrils daily. (Patient taking differently: Place 2 sprays into both nostrils as needed.)   pregabalin  (LYRICA ) 50 MG capsule TAKE ONE CAPSULE BY MOUTH TWICE A DAY AND TAKE ONE CAPSULE BY MOUTH AT BEDTIME   VITAMIN D -VITAMIN K PO Take by mouth.   [DISCONTINUED] metoprolol  succinate (TOPROL -XL) 25 MG 24 hr tablet TAKE 1 TABLET BY MOUTH DAILY     Allergies:   Morphine   Social History   Socioeconomic History   Marital status: Married    Spouse name: alex   Number of children: 0   Years of education: 16   Highest education level: Bachelor's degree (e.g., BA, AB, BS)  Occupational History   Not on file  Tobacco Use   Smoking status: Never   Smokeless tobacco: Never   Tobacco comments:    Never smoked  Vaping Use   Vaping status: Never Used  Substance and Sexual Activity   Alcohol use: Not Currently     Alcohol/week: 1.0 standard drink of alcohol    Types: 1 Glasses of wine per week    Comment: SOCIALLY   Drug use: Never   Sexual activity: Not Currently  Other Topics Concern   Not on file  Social History Narrative   Married since 2012    No kids    adopted   From [place] moved to [facility] and now lives here    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    BA degree    No guns, wears seat belt, safe in relationship    Social Drivers of Health   Financial Resource Strain: Medium Risk (05/15/2023)   Overall Financial Resource Strain (CARDIA)    Difficulty of Paying Living Expenses: Somewhat hard  Food Insecurity: Food Insecurity Present (05/15/2023)   Hunger Vital Sign    Worried About Running Out of Food in the Last Year: Often true    Ran Out of Food in the Last Year: Sometimes true  Transportation Needs: No Transportation Needs (05/15/2023)   PRAPARE - Administrator, Civil Service (Medical): No    Lack of Transportation (Non-Medical): No  Physical Activity: Insufficiently Active (05/15/2023)   Exercise Vital Sign    Days of Exercise per Week: 2 days    Minutes of Exercise per Session: 30 min  Stress: Stress Concern Present (05/15/2023)   [employer] of Occupational Health - Occupational Stress Questionnaire    Feeling of Stress : Very much  Social Connections: Moderately Isolated (05/15/2023)   Social Connection and Isolation Panel [NHANES]    Frequency of Communication with Friends and Family: More than three times a week    Frequency of Social Gatherings with Friends and Family: Never    Attends Religious Services: Never    Database administrator or Organizations: No    Attends Engineer, structural: Not on file    Marital Status: Married     Family History: The patient's family history includes Aneurysm in her maternal aunt; Arthritis in  her father and mother; Breast  cancer in her maternal aunt; Cancer in her maternal aunt; Depression in her father; Diabetes in her brother, father, and sister; Drug abuse in her father and mother; Early death in her mother; Fibromyalgia in her maternal aunt and sister; Hypertension in her father; Osteoarthritis in her maternal grandmother; Rashes / Skin problems in her sister; Thyroid  disease in her maternal aunt. She was adopted.  ROS:   Please see the history of present illness.     All other systems reviewed and are negative.  EKGs/Labs/Other Studies Reviewed:    The following studies were reviewed today:   EKG Interpretation Date/Time:  Monday October 31 2023 10:12:38 EDT Ventricular Rate:  86 PR Interval:  120 QRS Duration:  84 QT Interval:  372 QTC Calculation: 445 R Axis:   44  Text Interpretation: Sinus rhythm with Premature atrial complexes Minimal voltage criteria for LVH, may be normal variant ( Sokolow-Lyon ) Confirmed by Constancia Delton ([id number]) on 10/31/2023 10:43:42 AM    Recent Labs: 05/16/2023: Hemoglobin 12.4; Platelets 423.0; TSH 2.69 09/06/2023: ALT 21; BUN 12; Creatinine, Ser 0.74; Potassium 4.3; Sodium 144  Recent Lipid Panel    Component Value Date/Time   CHOL 184 02/15/2019 1004   TRIG 55 02/15/2019 1004   HDL 117 02/15/2019 1004   CHOLHDL 1.6 02/15/2019 1004   CHOLHDL 1.8 12/05/2017 1044   LDLCALC 56 02/15/2019 1004   LDLCALC 74 12/05/2017 1044     Risk Assessment/Calculations:        Physical Exam:    VS:  BP (!) 176/87 (BP Location: Left Arm, Patient Position: Sitting, Cuff Size: Normal)   Pulse 86   Ht 5\' 2"  (1.575 m)   Wt 160 lb 6 oz (72.7 kg)   SpO2 97%   BMI 29.33 kg/m     Wt Readings from Last 3 Encounters:  10/31/23 160 lb 6 oz (72.7 kg)  10/20/23 160 lb (72.6 kg)  05/16/23 169 lb 2 oz (76.7 kg)     GEN:  Well nourished, well developed in no acute distress HEENT: Normal NECK: No JVD; No carotid bruits CARDIAC: RRR, no murmurs, rubs, gallops RESPIRATORY:   Clear to auscultation without rales, wheezing or rhonchi  ABDOMEN: Soft, non-tender, non-distended MUSCULOSKELETAL:  No edema; No deformity  SKIN: Warm and dry NEUROLOGIC:  Alert and oriented x 3 PSYCHIATRIC:  Normal affect   ASSESSMENT:    1. Paroxysmal atrial fibrillation (HCC)   2. Mitral valve insufficiency, unspecified etiology   3. Bicuspid aortic valve    PLAN:    In order of problems listed above:  Paroxysmal atrial fibrillation, outside EKG reviewed confirming A-fib.  Echo 02/2022 with normal EF 60 to 65%.  Refer to A-fib clinic.  CHA2DS2-VASc=1 (female ). increase Toprol -XL to 50 mg daily, continue aspirin 81 mg daily.  Evaluate with 2-week cardiac monitor.  Refer to EP for ablation consideration.  Consider Coumadin if NOAC cost issues persist.  Repeat echo. Mild MR, no evidence for mitral valve prolapse.  Repeat echo as above. Aortic valve appears bicuspid on last echo.  Repeat echo as above.  Follow-up in 2 months.     Medication Adjustments/Labs and Tests Ordered: Current medicines are reviewed at length with the patient today.  Concerns regarding medicines are outlined above.  Orders Placed This Encounter  Procedures   Ambulatory referral to Cardiac Electrophysiology   LONG TERM MONITOR (3-14 DAYS)   EKG 12-Lead   ECHOCARDIOGRAM COMPLETE   Meds ordered this encounter  Medications   metoprolol  succinate (TOPROL -XL) 50 MG 24 hr tablet    Sig: Take 1 tablet (50 mg total) by mouth daily.    Dispense:  30 tablet    Refill:  3    Patient Instructions  Medication Instructions:  Increase Metoprolol  Succinate 50 mg take one tablet daily.   *If you need a refill on your cardiac medications before your next appointment, please call your pharmacy*  Lab Work: NO LABS   If you have labs (blood work) drawn today and your tests are completely normal, you will receive your results only by: MyChart Message (if you have MyChart) OR A paper copy in the mail If you have  any lab test that is abnormal or we need to change your treatment, we will call you to review the results.  Testing/Procedures:  Your physician has requested that you have an echocardiogram. Echocardiography is a painless test that uses sound waves to create images of your heart. It provides your doctor with information about the size and shape of your heart and how well your heart's chambers and valves are working.   You may receive an ultrasound enhancing agent through an IV if needed to better visualize your heart during the echo. This procedure takes approximately one hour.  There are no restrictions for this procedure.  This will take place at [address] [facility] [address]) [address], [place] [address]  Please note: We ask at that you not bring children with you during ultrasound (echo/ vascular) testing. Due to room size and safety concerns, children are not allowed in the ultrasound rooms during exams. Our front office staff cannot provide observation of children in our lobby area while testing is being conducted. An adult accompanying a patient to their appointment will only be allowed in the ultrasound room at the discretion of the ultrasound technician under special circumstances. We apologize for any inconvenience.    Your physician has recommended that you wear a Zio monitor.   This monitor is a medical device that records the heart's electrical activity. Doctors most often use these monitors to diagnose arrhythmias. Arrhythmias are problems with the speed or rhythm of the heartbeat. The monitor is a small device applied to your chest. You can wear one while you do your normal daily activities. While wearing this monitor if you have any symptoms to push the button and record what you felt. Once you have worn this monitor for the period of time provider prescribed (Usually 14 days), you will return the monitor device in the postage paid box. Once it is returned they will download  the data collected and provide us  with a report which the provider will then review and we will call you with those results. Important tips:  Avoid showering during the first 24 hours of wearing the monitor. Avoid excessive sweating to help maximize wear time. Do not submerge the device, no hot tubs, and no swimming pools. Keep any lotions or oils away from the patch. After 24 hours you may shower with the patch on. Take brief showers with your back facing the shower head.  Do not remove patch once it has been placed because that will interrupt data and decrease adhesive wear time. Push the button when you have any symptoms and write down what you were feeling. Once you have completed wearing your monitor, remove and place into box which has postage paid and place in your outgoing mailbox.  If for some reason you have misplaced your  box then call our office and we can provide another box and/or mail it off for you.   Follow-Up: At [facility], you and your health needs are our priority.  As part of our continuing mission to provide you with exceptional heart care, our providers are all part of one team.  This team includes your primary Cardiologist (physician) and Advanced Practice Providers or APPs (Physician Assistants and Nurse Practitioners) who all work together to provide you with the care you need, when you need it.  Your next appointment:   6 week(s)  Provider:   You will see Dr.Agbor-Etang or one of the following Advanced Practice Providers on your designated Care Team:   Laneta Pintos, NP Gildardo Labrador, PA-C Varney Gentleman, PA-C Cadence Gennaro Khat, PA-C Ronald Cockayne, NP Morey Ar, NP  Referral for EP with Dr. Arturo Late or Dr. Daneil Dunker   We recommend signing up for the patient portal called "MyChart".  Sign up information is provided on this After Visit Summary.  MyChart is used to connect with patients for Virtual Visits (Telemedicine).  Patients are able to view  lab/test results, encounter notes, upcoming appointments, etc.  Non-urgent messages can be sent to your provider as well.   To learn more about what you can do with MyChart, go to [email or web address].         Signed, Constancia Delton, MD  10/31/2023 11:22 AM    [facility]

## 2023-11-01 ENCOUNTER — Ambulatory Visit
Admission: RE | Admit: 2023-11-01 | Discharge: 2023-11-01 | Disposition: A | Discharge status: Home / Self Care | Source: Ambulatory Visit | Attending: Family Medicine | Admitting: Family Medicine

## 2023-11-01 ENCOUNTER — Ambulatory Visit

## 2023-11-01 DIAGNOSIS — Z1231 Encounter for screening mammogram for malignant neoplasm of breast: Secondary | ICD-10-CM | POA: Diagnosis not present

## 2023-11-08 ENCOUNTER — Ambulatory Visit (INDEPENDENT_AMBULATORY_CARE_PROVIDER_SITE_OTHER): Admitting: *Deleted

## 2023-11-08 VITALS — Ht 62.0 in | Wt 158.0 lb

## 2023-11-08 DIAGNOSIS — Z0000 Encounter for general adult medical examination without abnormal findings: Secondary | ICD-10-CM

## 2023-11-08 NOTE — Progress Notes (Signed)
 Subjective:   Shelby Colon is a 57 y.o. who presents for a Medicare Wellness preventive visit.  As a reminder, Annual Wellness Visits don't include a physical exam, and some assessments may be limited, especially if this visit is performed virtually. We may recommend an in-person follow-up visit with your provider if needed.  Visit Complete: Virtual I connected with  Shelby Colon on 11/08/23 by a video and audio enabled telemedicine application and verified that I am speaking with the correct person using two identifiers.  Patient Location: Home  Provider Location: Home Office  I discussed the limitations of evaluation and management by telemedicine. The patient expressed understanding and agreed to proceed.  Vital Signs: Because this visit was a virtual/telehealth visit, some criteria may be missing or patient reported. Any vitals not documented were not able to be obtained and vitals that have been documented are patient reported.    Persons Participating in Visit: Patient.  AWV Questionnaire: No: Patient Medicare AWV questionnaire was not completed prior to this visit.  Cardiac Risk Factors include: hypertension;Other (see comment), Risk factor comments: AFib     Objective:     Today's Vitals   11/08/23 1134 11/08/23 1135  Weight: 158 lb (71.7 kg)   Height: 5\' 2"  (1.575 m)   PainSc:  6    Body mass index is 28.9 kg/m.     11/08/2023   11:55 AM 02/24/2023    8:27 AM 12/28/2022    2:31 PM 02/10/2022    6:53 AM 12/09/2021   12:26 PM 12/08/2021   11:28 AM 06/11/2020    1:08 PM  Advanced Directives  Does Patient Have a Medical Advance Directive? No No No No  No No  Would patient like information on creating a medical advance directive? No - Patient declined   No - Patient declined No - Patient declined  No - Patient declined    Current Medications (verified) Outpatient Encounter Medications as of 11/08/2023  Medication Sig   aspirin EC (ASPIRIN 81) 81 MG tablet Take  81 mg by mouth daily. Swallow whole.   Cyanocobalamin  (B-12) 1000 MCG SUBL Place 1 tablet under the tongue daily.   cyclobenzaprine  (FLEXERIL ) 10 MG tablet Take 1 tablet (10 mg total) by mouth 3 (three) times daily as needed for muscle spasms. This can make you sleepy.   fluticasone  (FLONASE ) 50 MCG/ACT nasal spray Place 2 sprays into both nostrils daily. (Patient taking differently: Place 2 sprays into both nostrils as needed.)   metoprolol  succinate (TOPROL -XL) 50 MG 24 hr tablet Take 1 tablet (50 mg total) by mouth daily.   pregabalin  (LYRICA ) 50 MG capsule TAKE ONE CAPSULE BY MOUTH TWICE A DAY AND TAKE ONE CAPSULE BY MOUTH AT BEDTIME   VITAMIN D -VITAMIN K PO Take by mouth.   No facility-administered encounter medications on file as of 11/08/2023.    Allergies (verified) Morphine   History: Past Medical History:  Diagnosis Date   Abnormal MRI, cervical spine 10/17/2019   FINDINGS: Alignment: Reversal of the normal cervical lordosis with apex at C3-4. Trace retrolisthesis of C4 on C5, C5 on C6, and C6 on C7, with anterolisthesis of C7 on T1. Findings chronic and facet mediated.   Vertebrae: Vertebral body height maintained without evidence for acute or chronic fracture. Bone marrow signal intensity within normal limits. No discrete or worrisome osseous lesions. Pro   Abnormal Pap smear of cervix    h/o LSIL pap with HPV +; 01/2019 pap negative negative HPV   Alcohol use  02/09/2022   Altered mental status 12/08/2021   Altered mental status 12/08/2021   Annual physical exam 06/12/2020   Anxiety    Anxiety 06/12/2020   Anxiety and depression 11/30/2017   Atrial fibrillation (HCC)    Atrophy of right kidney 03/05/2019   Left kidney ok   B12 deficiency    Benign neoplasm of ascending colon    Brain cyst 12/10/2021   Cardiac murmur 02/14/2018   as a child   Chronic knee pain (Bilateral) 09/07/2018   09/21/2018 left total knee ortho [place] [facility]    Chronic  pain syndrome    Colon polyps    Confusion 12/10/2021   Congenital absence of right kidney    Degeneration of lumbar intervertebral disc    Back surgery, left leg knee down numb and tingling   Depression    Early menopause    Elevated alkaline phosphatase level 02/23/2019   Elevated BP without diagnosis of hypertension 08/04/2021   Last Assessment & Plan: Formatting of this note might be different from the original. BP at last visit was 131/85 and today is 156/88.  Advised patient to come back within the next 2 to 4 weeks to recheck her blood pressure.  Goal BP is less than 140/90.   Gallstones    Gastric bypass status for obesity    2013, weight 258lb   Grade I diastolic dysfunction    Headache    History of   Heart murmur    MVP   History of chicken pox    HPV in female 11/30/2017   Hypercholesterolemia    Iron  deficiency    Iron  deficiency anemia 03/02/2019   Mild mitral regurgitation by prior echocardiogram    Mild tricuspid regurgitation by prior echocardiogram    Neuromuscular disorder (HCC)    Neuropathy    Paroxysmal SVT (supraventricular tachycardia) (HCC)    Personal history of colonic polyps    Pharmacologic therapy 10/17/2019   Polyp of sigmoid colon    Problems influencing health status 10/17/2019   PTSD (post-traumatic stress disorder)    Rheumatoid arthritis (HCC)    as child    Scoliosis of lumbar spine    SIADH (syndrome of inappropriate ADH production) (HCC) 01/01/2022   Solitary kidney, congenital    pt thinks has left kidney only no right kidney   Thrombocytosis 02/23/2019   Thyroid  disease    in the past    Transaminitis 12/08/2021   UTI (urinary tract infection)    Vitamin D  deficiency    Wears partial dentures    bottom   Past Surgical History:  Procedure Laterality Date   ANTERIOR CERVICAL DECOMPRESSION/DISCECTOMY FUSION 4 LEVELS N/A 11/28/2019   Procedure: ANTERIOR CERVICAL DECOMPRESSION/DISCECTOMY FUSION 4 LEVELS C3-7;  Surgeon: Jodeen Munch, MD;  Location: [facility];  Service: Neurosurgery;  Laterality: N/A;   AUGMENTATION MAMMAPLASTY Bilateral 2009   BACK SURGERY  11/04/2014   fusion of spine of lumbosacral region 2 rods and 16 screws    BREAST ENHANCEMENT SURGERY     COLONOSCOPY WITH PROPOFOL  N/A 01/06/2018   Procedure: COLONOSCOPY WITH PROPOFOL ;  Surgeon: Irby Mannan, MD;  Location: [facility];  Service: Endoscopy;  Laterality: N/A;   COLONOSCOPY WITH PROPOFOL  N/A 03/19/2019   Procedure: COLONOSCOPY WITH PROPOFOL ;  Surgeon: Irby Mannan, MD;  Location: [facility];  Service: Endoscopy;  Laterality: N/A;   COLONOSCOPY WITH PROPOFOL  N/A 02/24/2023   Procedure: COLONOSCOPY WITH PROPOFOL ;  Surgeon: Selena Daily, MD;  Location: [facility];  Service: Endoscopy;  Laterality: N/A;   COSMETIC SURGERY     ESOPHAGOGASTRODUODENOSCOPY (EGD) WITH PROPOFOL  N/A 02/24/2023   Procedure: ESOPHAGOGASTRODUODENOSCOPY (EGD) WITH PROPOFOL ;  Surgeon: Selena Daily, MD;  Location: [facility];  Service: Endoscopy;  Laterality: N/A;   excess skin removal     JOINT REPLACEMENT  09/20/2008   both knees done right was last year and left just done this   KNEE ARTHROSCOPY Bilateral    x 7 knees b/l (2 surgeries on right knee) total 1 bone graft and 5 arthroscopy total knee    POLYPECTOMY  02/24/2023   Procedure: POLYPECTOMY;  Surgeon: Selena Daily, MD;  Location: [facility];  Service: Endoscopy;;   ROUX-EN-Y PROCEDURE     SPINE SURGERY  11/07/2014   2 rods and 16 screws   TOTAL KNEE ARTHROPLASTY     left 09/21/18 Dr. Marshal Skeens II Antoinette Kirschner   TOTAL KNEE ARTHROPLASTY     right knee Dr. Melonie Square    Family History  Adopted: Yes  Problem Relation Age of Onset   Arthritis Mother    Drug abuse Mother        heriod OD   Early death Mother    Diabetes Father    Hypertension Father    Depression Father    Arthritis Father    Drug abuse Father    Diabetes Sister     Fibromyalgia Sister    Rashes / Skin problems Sister        PSORIASIS   Diabetes Brother    Osteoarthritis Maternal Grandmother    Aneurysm Maternal Aunt    Thyroid  disease Maternal Aunt    Fibromyalgia Maternal Aunt    Breast cancer Maternal Aunt    Cancer Maternal Aunt        breast cancer    Social History   Socioeconomic History   Marital status: Married    Spouse name: alex   Number of children: 0   Years of education: 16   Highest education level: Bachelor's degree (e.g., BA, AB, BS)  Occupational History   Not on file  Tobacco Use   Smoking status: Never   Smokeless tobacco: Never   Tobacco comments:    Never smoked  Vaping Use   Vaping status: Never Used  Substance and Sexual Activity   Alcohol use: Not Currently    Alcohol/week: 1.0 standard drink of alcohol    Types: 1 Glasses of wine per week    Comment: SOCIALLY   Drug use: Never   Sexual activity: Not Currently  Other Topics Concern   Not on file  Social History Narrative   Married since 2012    No kids    adopted   From [place] moved to [place] and now lives here    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    BA degree    No guns, wears seat belt, safe in relationship    Social Drivers of Health   Financial Resource Strain: Medium Risk (11/08/2023)   Overall Financial Resource Strain (CARDIA)    Difficulty of Paying Living Expenses: Somewhat hard  Food Insecurity: No Food Insecurity (11/08/2023)   Hunger Vital Sign    Worried About Running Out of Food in the Last Year: Never true    Ran Out of Food in the Last Year: Never true  Transportation Needs: No Transportation Needs (11/08/2023)   PRAPARE - Transportation  Lack of Transportation (Medical): No    Lack of Transportation (Non-Medical): No  Physical Activity: Inactive (11/08/2023)   Exercise Vital Sign    Days of Exercise per Week: 0 days    Minutes of Exercise  per Session: 0 min  Stress: Stress Concern Present (11/08/2023)   [employer] of Occupational Health - Occupational Stress Questionnaire    Feeling of Stress : Very much  Social Connections: Moderately Isolated (11/08/2023)   Social Connection and Isolation Panel [NHANES]    Frequency of Communication with Friends and Family: More than three times a week    Frequency of Social Gatherings with Friends and Family: Once a week    Attends Religious Services: Never    Database administrator or Organizations: No    Attends Engineer, structural: Never    Marital Status: Married    Tobacco Counseling Counseling given: Not Answered Tobacco comments: Never smoked    Clinical Intake:  Pre-visit preparation completed: Yes  Pain : 0-10 Pain Score: 6  Pain Location: Back Pain Orientation: Lower Pain Descriptors / Indicators: Aching, Pressure Pain Onset: More than a month ago Pain Frequency: Intermittent     BMI - recorded: 28.9 Nutritional Status: BMI 25 -29 Overweight Nutritional Risks: None Diabetes: No  Lab Results  Component Value Date   HGBA1C 5.2 02/13/2018     How often do you need to have someone help you when you read instructions, pamphlets, or other written materials from your doctor or pharmacy?: 1 - Never  Interpreter Needed?: No  Information entered by :: R. Durk Carmen LPN   Activities of Daily Living     11/08/2023   11:38 AM 02/24/2023    8:26 AM  In your present state of health, do you have any difficulty performing the following activities:  Hearing? 0 0  Vision? 0 0  Comment readers   Difficulty concentrating or making decisions? 1 0  Walking or climbing stairs? 1   Dressing or bathing? 0   Doing errands, shopping? 1   Preparing Food and eating ? N   Using the Toilet? N   In the past six months, have you accidently leaked urine? N   Do you have problems with loss of bowel control? N   Managing your Medications? N   Managing your Finances?  N   Housekeeping or managing your Housekeeping? Y     Patient Care Team: Valli Gaw, MD as PCP - General (Family Medicine) Constancia Delton, MD as PCP - Cardiology (Cardiology) Timmy Forbes, MD as Consulting Physician (Oncology)  I have updated your Care Teams any recent Medical Services you may have received from other providers in the past year.     Assessment:    This is a routine wellness examination for Susank.  Hearing/Vision screen Hearing Screening - Comments:: No issues Vision Screening - Comments:: readers   Goals Addressed             This Visit's Progress    Patient Stated       Wants to lose more weight, eat better and go to the gym        Depression Screen     11/08/2023   11:47 AM 05/16/2023    3:03 PM 02/11/2023   11:57 AM 01/11/2023    2:14 PM 01/04/2023    4:58 PM 12/30/2022   10:49 AM 11/03/2022   10:29 AM  PHQ 2/9 Scores  PHQ - 2 Score 4 5 4   5  5  PHQ- 9 Score 13 14 15   15 16      Information is confidential and restricted. Go to Review Flowsheets to unlock data.    Fall Risk     11/08/2023   11:43 AM 05/16/2023    3:03 PM 02/11/2023   11:56 AM 12/30/2022   10:49 AM 11/03/2022   10:32 AM  Fall Risk   Falls in the past year? 1 1 1 1 1   Number falls in past yr: 1 1 1 1 1   Injury with Fall? 1 0 0 0 1  Comment bruises, no medical attention required      Risk for fall due to : History of fall(s);Impaired balance/gait No Fall Risks  No Fall Risks   Follow up Falls evaluation completed;Falls prevention discussed Falls evaluation completed;Education provided Education provided Falls evaluation completed Falls evaluation completed    MEDICARE RISK AT HOME:  Medicare Risk at Home Any stairs in or around the home?: No If so, are there any without handrails?: No Home free of loose throw rugs in walkways, pet beds, electrical cords, etc?: Yes Adequate lighting in your home to reduce risk of falls?: Yes Life alert?: No Use of a cane, walker or w/c?:  Yes Grab bars in the bathroom?: No Shower chair or bench in shower?: Yes Elevated toilet seat or a handicapped toilet?: No  TIMED UP AND GO:  Was the test performed?  No  Cognitive Function: 6CIT completed        11/08/2023   11:56 AM 08/18/2022    4:07 PM  6CIT Screen  What Year? 0 points 0 points  What month? 0 points 0 points  What time? 0 points 0 points  Count back from 20 0 points 0 points  Months in reverse 0 points 0 points  Repeat phrase 0 points 0 points  Total Score 0 points 0 points    Immunizations Immunization History  Administered Date(s) Administered   Hepb-cpg 08/18/2022, 09/16/2022   Influenza, Quadrivalent, Recombinant, Inj, Pf 04/14/2019   Influenza,inj,Quad PF,6+ Mos 06/10/2020   Influenza-Unspecified 04/14/2019   PFIZER(Purple Top)SARS-COV-2 Vaccination 12/22/2019, 01/12/2020   Td 02/01/2017   Tdap 02/01/2017    Screening Tests Health Maintenance  Topic Date Due   Pneumococcal Vaccine 76-61 Years old (1 of 2 - PCV) Never done   Zoster Vaccines- Shingrix (1 of 2) Never done   COVID-19 Vaccine (3 - Pfizer risk series) 02/09/2020   Medicare Annual Wellness (AWV)  08/18/2023   INFLUENZA VACCINE  12/30/2023   Cervical Cancer Screening (HPV/Pap Cotest)  02/12/2024   MAMMOGRAM  10/31/2024   DTaP/Tdap/Td (3 - Td or Tdap) 02/02/2027   Colonoscopy  02/23/2030   Hepatitis C Screening  Completed   HIV Screening  Completed   HPV VACCINES  Aged Out   Meningococcal B Vaccine  Aged Out    Health Maintenance  Health Maintenance Due  Topic Date Due   Pneumococcal Vaccine 11-59 Years old (1 of 2 - PCV) Never done   Zoster Vaccines- Shingrix (1 of 2) Never done   COVID-19 Vaccine (3 - Pfizer risk series) 02/09/2020   Medicare Annual Wellness (AWV)  08/18/2023   Health Maintenance Items Addressed: Patient declines flu and covid vaccines. Patient stated that she will consider pneumonia and shingles vaccines  Additional Screening:  Vision Screening:  Recommended annual ophthalmology exams for early detection of glaucoma and other disorders of the eye.Not up to date. Patient advised that she needs to schedule an eye appointment. Patient is going  to contact her insurance company to find out who is in network and will schedule and appointment. Would you like a referral to an eye doctor? No    Dental Screening: Recommended annual dental exams for proper oral hygiene  Community Resource Referral / Chronic Care Management: CRR required this visit?  No   CCM required this visit?  No   Plan:    I have personally reviewed and noted the following in the patient's chart:   Medical and social history Use of alcohol, tobacco or illicit drugs  Current medications and supplements including opioid prescriptions. Patient is not currently taking opioid prescriptions. Functional ability and status Nutritional status Physical activity Advanced directives List of other physicians Hospitalizations, surgeries, and ER visits in previous 12 months Vitals Screenings to include cognitive, depression, and falls Referrals and appointments  In addition, I have reviewed and discussed with patient certain preventive protocols, quality metrics, and best practice recommendations. A written personalized care plan for preventive services as well as general preventive health recommendations were provided to patient.   Felicitas Horse, LPN   [id number]   After Visit Summary: (MyChart) Due to this being a telephonic visit, the after visit summary with patients personalized plan was offered to patient via MyChart   Notes: Nothing significant to report at this time. Patient scheduled for an appointment 11/10/23 with PCP

## 2023-11-08 NOTE — Patient Instructions (Addendum)
 Ms. Gross , Thank you for taking time out of your busy schedule to complete your Annual Wellness Visit with me. I enjoyed our conversation and look forward to speaking with you again next year. I, as well as your care team,  appreciate your ongoing commitment to your health goals. Please review the following plan we discussed and let me know if I can assist you in the future. Your Game plan/ To Do List    Referrals: If you haven't heard from the office you've been referred to, please reach out to them at the phone provided.  Remember to call and schedule an eye appointment. Consider updating your pneumonia and shingles vaccines. Follow up Visits: Next Medicare AWV with our clinical staff: 11/09/24 @ 11:30   Have you seen your provider in the last 6 months (3 months if uncontrolled diabetes)? No Next Office Visit with your provider: 11/10/23  Clinician Recommendations:  Aim for 30 minutes of exercise or brisk walking, 6-8 glasses of water , and 5 servings of fruits and vegetables each day.       This is a list of the screening recommended for you and due dates:  Health Maintenance  Topic Date Due   Pneumococcal Vaccination (1 of 2 - PCV) Never done   Zoster (Shingles) Vaccine (1 of 2) Never done   COVID-19 Vaccine (3 - Pfizer risk series) 02/09/2020   Flu Shot  12/30/2023   Pap with HPV screening  02/12/2024   Mammogram  10/31/2024   Medicare Annual Wellness Visit  11/07/2024   DTaP/Tdap/Td vaccine (3 - Td or Tdap) 02/02/2027   Colon Cancer Screening  02/23/2030   Hepatitis C Screening  Completed   HIV Screening  Completed   HPV Vaccine  Aged Out   Meningitis B Vaccine  Aged Out    Advanced directives: (ACP Link)Information on Advanced Care Planning can be found at [facility]  Print production planner Health Care Directives Advance Health Care Directives. [email or web address]  Advance Care Planning is important because it:  [x]  Makes sure you receive the medical care that is consistent with  your values, goals, and preferences  [x]  It provides guidance to your family and loved ones and reduces their decisional burden about whether or not they are making the right decisions based on your wishes.  Follow the link provided in your after visit summary or read over the paperwork we have mailed to you to help you started getting your Advance Directives in place. If you need assistance in completing these, please reach out to us  so that we can help you!

## 2023-11-10 ENCOUNTER — Ambulatory Visit (INDEPENDENT_AMBULATORY_CARE_PROVIDER_SITE_OTHER): Admitting: Family Medicine

## 2023-11-10 ENCOUNTER — Ambulatory Visit: Admitting: Family Medicine

## 2023-11-10 VITALS — BP 118/78 | HR 77 | Temp 97.9°F | Resp 20 | Ht 62.0 in | Wt 161.4 lb

## 2023-11-10 DIAGNOSIS — E559 Vitamin D deficiency, unspecified: Secondary | ICD-10-CM | POA: Diagnosis not present

## 2023-11-10 DIAGNOSIS — F39 Unspecified mood [affective] disorder: Secondary | ICD-10-CM

## 2023-11-10 DIAGNOSIS — Z8679 Personal history of other diseases of the circulatory system: Secondary | ICD-10-CM

## 2023-11-10 DIAGNOSIS — E538 Deficiency of other specified B group vitamins: Secondary | ICD-10-CM

## 2023-11-10 DIAGNOSIS — I10 Essential (primary) hypertension: Secondary | ICD-10-CM

## 2023-11-10 MED ORDER — ESCITALOPRAM OXALATE 5 MG PO TABS: 5.0000 mg | ORAL_TABLET | Freq: Every day | ORAL | 1 refills | Status: DC

## 2023-11-10 NOTE — Patient Instructions (Addendum)
 It was a pleasure meeting you today. Thank you for allowing me to take part in your health care.  Our goals for today as we discussed include:  Start Lexapro 5 mg daily Referral sent to [facility] [address], [place], [place] [address] Phone: [phone]  For Mental Health Concerns  [facility] [phone] Address: [address] [place], [place] [address] Hours: Open 24/7, No appointment required.    We will get some labs today.  If they are abnormal or we need to do something about them, I will call you.  If they are normal, I will send you a message on MyChart (if it is active) or a letter in the mail.  If you don't hear from us  in 2 weeks, please call the office at the number below.   Have sent request to pharmacy to enquire about cost of Eliquis and if can get assistance Will notify you if can help   This is a list of the screening recommended for you and due dates:  Health Maintenance  Topic Date Due   Pneumococcal Vaccination (1 of 2 - PCV) Never done   Zoster (Shingles) Vaccine (1 of 2) Never done   COVID-19 Vaccine (3 - Pfizer risk series) 02/09/2020   Flu Shot  12/30/2023   Pap with HPV screening  02/12/2024   Mammogram  10/31/2024   Medicare Annual Wellness Visit  11/07/2024   DTaP/Tdap/Td vaccine (3 - Td or Tdap) 02/02/2027   Colon Cancer Screening  02/23/2030   Hepatitis C Screening  Completed   HIV Screening  Completed   HPV Vaccine  Aged Out   Meningitis B Vaccine  Aged Out      If you have any questions or concerns, please do not hesitate to call the office at [phone].  I look forward to our next visit and until then take care and stay safe.  Regards,   Valli Gaw, MD   [facility]

## 2023-11-10 NOTE — Progress Notes (Signed)
 SUBJECTIVE:   Chief Complaint  Patient presents with   Depression   HPI Presents to clinic for acute visit  Discussed the use of AI scribe software for clinical note transcription with the patient, who gave verbal consent to proceed.  History of Present Illness Shelby Colon is a 58 year old female who presents for follow-up on her mental health and cardiac issues.  She is experiencing significant stress and emotional numbness, which she attributes to recent life events, including moving to [place]  to care for her mother who has early-stage dementia. She feels 'numb' and unable to cry, despite wanting to. She has a history of using Cymbalta , which she discontinued due to feeling emotionally numb.  She has a history of atrial fibrillation, which led to a recent hospitalization in [place] . During the episode, she felt like her heart was 'going to come out of her chest,' with sensations in her throat and ears, and a feeling of impending fainting. Her heart rate was recorded at 166 beats per minute during the episode. She is currently on metoprolol  50 mg and a daily baby aspirin. Cost is a concern for starting a blood thinner like Eliquis due to her insurance coverage. She has been using a heart monitor and is scheduled for an echocardiogram next week. Since her metoprolol  dose was increased, she has not experienced racing heart episodes, but she occasionally feels breathless when lying down or bending over.  She has been trying to lose weight and has undergone lymphatic drainage treatments, which resulted in some weight loss. She is following a macronutrient diet plan and has lost six pounds. Her past medical history includes a recent endocrinology evaluation for weight gain, which showed some abnormalities, but her thyroid  function was normal. She has a history of elevated platelets, which she attributes to inflammation.      PERTINENT PMH / PSH: As above  OBJECTIVE:  BP 118/78   Pulse  77   Temp 97.9 F (36.6 C)   Resp 20   Ht 5' 2 (1.575 m)   Wt 161 lb 6 oz (73.2 kg)   SpO2 100%   BMI 29.52 kg/m    Physical Exam Vitals reviewed.  Constitutional:      General: She is not in acute distress.    Appearance: Normal appearance. She is normal weight. She is not ill-appearing, toxic-appearing or diaphoretic.   Eyes:     General:        Right eye: No discharge.        Left eye: No discharge.     Conjunctiva/sclera: Conjunctivae normal.    Cardiovascular:     Rate and Rhythm: Normal rate and regular rhythm.     Heart sounds: Normal heart sounds.  Pulmonary:     Effort: Pulmonary effort is normal.     Breath sounds: Normal breath sounds.  Abdominal:     General: Bowel sounds are normal.   Musculoskeletal:        General: Normal range of motion.   Skin:    General: Skin is warm and dry.   Neurological:     General: No focal deficit present.     Mental Status: She is alert and oriented to person, place, and time. Mental status is at baseline.   Psychiatric:        Attention and Perception: Attention normal.        Mood and Affect: Mood is depressed.        Speech: Speech normal.  Behavior: Behavior normal. Behavior is cooperative.        Thought Content: Thought content normal.        Cognition and Memory: Cognition normal.        Judgment: Judgment normal.           11/10/2023    1:23 PM 11/08/2023   11:47 AM 05/16/2023    3:03 PM 02/11/2023   11:57 AM 01/11/2023    2:14 PM  Depression screen PHQ 2/9  Decreased Interest 2 1 3 2    Down, Depressed, Hopeless 3 3 2 2    PHQ - 2 Score 5 4 5 4    Altered sleeping 2 3 3 3    Tired, decreased energy 3 2 1 2    Change in appetite 3 1 2 3    Feeling bad or failure about yourself  3 3 3 3    Trouble concentrating 0 0 0 0   Moving slowly or fidgety/restless 2 0 0 0   Suicidal thoughts 0 0 0 0   PHQ-9 Score 18 13 14 15    Difficult doing work/chores Very difficult Extremely dIfficult Very difficult  Somewhat difficult      Information is confidential and restricted. Go to Review Flowsheets to unlock data.      11/10/2023    1:23 PM 05/16/2023    3:04 PM 02/11/2023   11:57 AM 01/11/2023    2:15 PM  GAD 7 : Generalized Anxiety Score  Nervous, Anxious, on Edge 3 2 1    Control/stop worrying 3 3 2    Worry too much - different things 3 3 1    Trouble relaxing 0 0 0   Restless 0 0 0   Easily annoyed or irritable 3 2 1    Afraid - awful might happen 1 0 0   Total GAD 7 Score 13 10 5    Anxiety Difficulty Very difficult Very difficult Somewhat difficult      Information is confidential and restricted. Go to Review Flowsheets to unlock data.    ASSESSMENT/PLAN:  Mood disorder [facility]) Assessment & Plan: Reports numbness, irritability, and inability to experience joy. Discontinued Cymbalta . Seeing therapist; Previously referred to psychiatry but closed due to patient wanting therapy and not psychiatry at that time. High depression and anxiety scale.  MDQ screening negative - Administer additional depression and anxiety screening tool. - Start Lexapro  5 mg daily - Refer to psychiatry    Orders: -     Ambulatory referral to Psychiatry -     Escitalopram  Oxalate; Take 1 tablet (5 mg total) by mouth daily.  Dispense: 90 tablet; Refill: 1  Vitamin D  deficiency -     VITAMIN D  25 Hydroxy (Vit-D Deficiency, Fractures)  Vitamin B 12 deficiency -     Vitamin B12  Primary hypertension Assessment & Plan: Chronic.  Well controlled on current medication.  Did not tolerate carvedilol  Metoprolol  recently increased to 50 mg by cardiologist for recent episode of A.Fib          History of atrial fibrillation Assessment & Plan: Recently seen at in hospital at [facility] in [place]  for episode of A Fib.   Had follow up with  Cardiology here in [facility] Metoprolol  increased from 25 mg to 50 mg daily Now has Zio Monitor  ECHO has been ordered Has appointment with EP for consideration of  ablation Not currently on DOAC due to financial constraints.  Taking ASA for coverage and rate controlled today. Reached out to pharmacy for financial assistance but unfortunately there is  none for DOAC. Recommend she continue to follow with Cardiology as scheduled     PDMP reviewed  Return in about 3 months (around 02/10/2024) for PCP.  Valli Gaw, MD

## 2023-11-11 ENCOUNTER — Encounter: Payer: Self-pay | Admitting: Family Medicine

## 2023-11-11 ENCOUNTER — Ambulatory Visit: Payer: Self-pay | Admitting: Family Medicine

## 2023-11-11 DIAGNOSIS — E559 Vitamin D deficiency, unspecified: Secondary | ICD-10-CM

## 2023-11-11 LAB — VITAMIN B12: Vitamin B-12: 570 pg/mL (ref 232–1245)

## 2023-11-11 LAB — VITAMIN D 25 HYDROXY (VIT D DEFICIENCY, FRACTURES): Vit D, 25-Hydroxy: 29.6 ng/mL — ABNORMAL LOW (ref 30.0–100.0)

## 2023-11-11 MED ORDER — VITAMIN D (ERGOCALCIFEROL) 1.25 MG (50000 UNIT) PO CAPS: 50000.0000 [IU] | ORAL_CAPSULE | ORAL | 1 refills | Status: DC

## 2023-11-11 NOTE — Assessment & Plan Note (Addendum)
 Recently seen at in hospital at [facility] in [place]  for episode of A Fib.   Had follow up with  Cardiology here in [facility] Metoprolol  increased from 25 mg to 50 mg daily Now has Zio Monitor  ECHO has been ordered Has appointment with EP for consideration of ablation Not currently on DOAC due to financial constraints.  Taking ASA for coverage and rate controlled today. Reached out to pharmacy for financial assistance but unfortunately there is none for DOAC. Recommend she continue to follow with Cardiology as scheduled

## 2023-11-11 NOTE — Assessment & Plan Note (Signed)
 Chronic.  Well controlled on current medication.  Did not tolerate carvedilol  Metoprolol  recently increased to 50 mg by cardiologist for recent episode of A.Fib

## 2023-11-11 NOTE — Telephone Encounter (Signed)
 Noted

## 2023-11-11 NOTE — Assessment & Plan Note (Signed)
 Reports numbness, irritability, and inability to experience joy. Discontinued Cymbalta . Seeing therapist; Previously referred to psychiatry but closed due to patient wanting therapy and not psychiatry at that time. High depression and anxiety scale.  MDQ screening negative - Administer additional depression and anxiety screening tool. - Start Lexapro  5 mg daily - Refer to psychiatry

## 2023-11-15 ENCOUNTER — Ambulatory Visit: Payer: Self-pay | Admitting: Cardiology

## 2023-11-15 ENCOUNTER — Ambulatory Visit: Attending: Cardiology

## 2023-11-15 DIAGNOSIS — I480 Paroxysmal atrial fibrillation: Secondary | ICD-10-CM | POA: Diagnosis not present

## 2023-11-15 LAB — ECHOCARDIOGRAM COMPLETE
AR max vel: 1.89 cm2
AV Area VTI: 1.89 cm2
AV Area mean vel: 1.9 cm2
AV Mean grad: 4 mmHg
AV Peak grad: 7.5 mmHg
Ao pk vel: 1.37 m/s
Area-P 1/2: 3.42 cm2
Calc EF: 70 %
S' Lateral: 3.1 cm
Single Plane A2C EF: 61.2 %
Single Plane A4C EF: 77.5 %

## 2023-11-17 DIAGNOSIS — I480 Paroxysmal atrial fibrillation: Secondary | ICD-10-CM | POA: Diagnosis not present

## 2023-11-22 DIAGNOSIS — I480 Paroxysmal atrial fibrillation: Secondary | ICD-10-CM

## 2023-11-22 NOTE — Progress Notes (Unsigned)
 Electrophysiology Office Note:    Date:  11/23/2023   ID:  Shelby Colon, DOB 1965/06/30, MRN [id number]  [facility] Cardiologist:  Redell Cave, MD  [facility] Electrophysiologist:  OLE ONEIDA HOLTS, MD   Referring MD: Cave Redell, MD   Chief Complaint: Paroxysmal atrial fibrillation  History of Present Illness:    Shelby Colon is a 58 year old woman who I am seeing today for an evaluation of atrial fibrillation at the request of Dr. Cave.  The patient has a history of anxiety, chronic back pain and paroxysmal atrial fibrillation.  She was seen in the hospital on a recent trip to [place]  with symptomatic atrial fibrillation.  There her heart rate was in the 160s.  She spontaneously converted back to sinus rhythm.  She was sent home on Eliquis.  She never started the Eliquis and now takes aspirin 81 mg by mouth once daily.  At the most recent appointment with Dr. Cave on October 31, 2023, her metoprolol  succinate was increased.  An echo was ordered given concern for bicuspid aortic valve.  She is with her husband today in clinic.  In [place]  she described sudden onset of headache.  The headache persisted and then began to be associated with heart palpitations and a feeling like her heart was racing.  This persisted for hours.  She started to feel lightheaded and dizzy.  She almost passed out several times.  Even after she had to the hospital her symptoms persisted.  Ultimately she was found to be in atrial fibrillation.  She then converted back to sinus rhythm spontaneously.  She was discharged on metoprolol .  She is tolerating metoprolol  but feels fatigued.  She is concerned about drug interactions with her Lexapro  and Lyrica  with any antiarrhythmic drugs we may prescribe.  She was prescribed Eliquis but did not fill the drug because of the high cost per month.    Their past medical, social and family history was reviewed.   ROS:   Please see the history of  present illness.    All other systems reviewed and are negative.  EKGs/Labs/Other Studies Reviewed:    The following studies were reviewed today:  November 22, 2023 ZIO monitor Less than 1% burden of atrial fibrillation, average heart rate of 142 bpm, symptomatic  November 15, 2023 echo EF 55-60 RV normal Mild to moderately dilated left atrium Mild MR Aortic sclerosis, no stenosis  [facility] records        Physical Exam:    VS:  BP (!) 147/80   Pulse 76   Ht 5' 2 (1.575 m)   Wt 163 lb (73.9 kg)   SpO2 95%   BMI 29.81 kg/m     Wt Readings from Last 3 Encounters:  11/23/23 163 lb (73.9 kg)  11/10/23 161 lb 6 oz (73.2 kg)  11/08/23 158 lb (71.7 kg)     GEN: no distress CARD: RRR, No MRG RESP: No IWOB. CTAB.        ASSESSMENT AND PLAN:    1. Paroxysmal atrial fibrillation (HCC)     #Paroxysmal atrial fibrillation/atrial tachycardia Symptomatic Low burden but rapidly conducted when she does have atrial fibrillation On aspirin 81 mg by mouth once daily for stroke prophylaxis  I discussed treatment options today in detail including conservative management, antiarrhythmic drugs and catheter ablation.  She is a good candidate for catheter ablation for upfront therapy.  She would like to proceed with this strategy.  Discussed treatment options today for AF including  antiarrhythmic drug therapy and ablation. Discussed risks, recovery and likelihood of success with each treatment strategy. Risk, benefits, and alternatives to EP study and ablation for afib were discussed. These risks include but are not limited to stroke, bleeding, vascular damage, tamponade, perforation, damage to the esophagus, lungs, phrenic nerve and other structures, pulmonary vein stenosis, worsening renal function, coronary vasospasm and death.  Discussed potential need for repeat ablation procedures and antiarrhythmic drugs after an initial ablation. The patient understands these risk  and wishes to proceed.  We will therefore proceed with catheter ablation at the next available time.  Carto, ICE, anesthesia are requested for the procedure.  Will also obtain CT PV protocol prior to the procedure to exclude LAA thrombus and further evaluate atrial anatomy.  She will need to start an anticoagulant 4 weeks prior to the procedure and plan to continue this for 3 months following the procedure.  We will have our pharmacist weigh in about the most affordable option.  It may be Coumadin.  I discussed this with the patient and she is agreeable.  CHA2DS2-VASc Score = 1  The patient's score is based upon: CHF History: 0 HTN History: 0 Diabetes History: 0 Stroke History: 0 Vascular Disease History: 0 Age Score: 0 Gender Score: 1     Signed, Ole T. Cindie, MD, [facility], [facility] 11/23/2023 11:40 AM    Electrophysiology [facility]

## 2023-11-23 ENCOUNTER — Other Ambulatory Visit: Payer: Self-pay

## 2023-11-23 ENCOUNTER — Ambulatory Visit: Attending: Cardiology | Admitting: Cardiology

## 2023-11-23 VITALS — BP 147/80 | HR 76 | Ht 62.0 in | Wt 163.0 lb

## 2023-11-23 DIAGNOSIS — I480 Paroxysmal atrial fibrillation: Secondary | ICD-10-CM | POA: Diagnosis not present

## 2023-11-23 NOTE — Patient Instructions (Addendum)
 Medication Instructions:  Your physician recommends that you continue on your current medications as directed. Please refer to the Current Medication list given to you today.  *If you need a refill on your cardiac medications before your next appointment, please call your pharmacy*  Lab Work: BMET and CBC - you may go to any [facility] location to have these drawn within 30 days of your procedure  Testing/Procedures: Cardiac CT Your physician has requested that you have cardiac CT. Cardiac computed tomography (CT) is a painless test that uses an x-ray machine to take clear, detailed pictures of your heart. For further information please visit [email or web address] We will call you to schedule your CT scan. It will be done about three weeks prior to your ablation.  Ablation Your physician has recommended that you have an ablation. Catheter ablation is a medical procedure used to treat some cardiac arrhythmias (irregular heartbeats). During catheter ablation, a long, thin, flexible tube is put into a blood vessel in your groin (upper thigh), or neck. This tube is called an ablation catheter. It is then guided to your heart through the blood vessel. Radio frequency waves destroy small areas of heart tissue where abnormal heartbeats may cause an arrhythmia to start.  You are scheduled for Atrial Fibrillation Ablation on Monday, September 15 with Dr. Ole Holts.Please arrive at the Main Entrance A at [facility]: [address] [place], [place] [address] at 5:30 AM   Follow-Up: At [facility], you and your health needs are our priority.  As part of our continuing mission to provide you with exceptional heart care, we have created designated Provider Care Teams.  These Care Teams include your primary Cardiologist (physician) and Advanced Practice Providers (APPs -  Physician Assistants and Nurse Practitioners) who all work together to provide you with the care you need, when you need  it.   Your next appointment:   We will contact you about your post-procedure follow up appointments.

## 2023-11-25 ENCOUNTER — Telehealth: Payer: Self-pay | Admitting: Pharmacy Technician

## 2023-11-25 ENCOUNTER — Encounter: Payer: Self-pay | Admitting: Oncology

## 2023-11-25 ENCOUNTER — Other Ambulatory Visit (HOSPITAL_COMMUNITY): Payer: Self-pay

## 2023-11-25 ENCOUNTER — Inpatient Hospital Stay: Attending: Oncology

## 2023-11-25 DIAGNOSIS — D508 Other iron deficiency anemias: Secondary | ICD-10-CM | POA: Diagnosis not present

## 2023-11-25 LAB — CBC WITH DIFFERENTIAL (CANCER CENTER ONLY)
Abs Immature Granulocytes: 0.01 10*3/uL (ref 0.00–0.07)
Basophils Absolute: 0.1 10*3/uL (ref 0.0–0.1)
Basophils Relative: 1 %
Eosinophils Absolute: 0.2 10*3/uL (ref 0.0–0.5)
Eosinophils Relative: 4 %
HCT: 39.5 % (ref 36.0–46.0)
Hemoglobin: 12 g/dL (ref 12.0–15.0)
Immature Granulocytes: 0 %
Lymphocytes Relative: 27 %
Lymphs Abs: 1.4 10*3/uL (ref 0.7–4.0)
MCH: 27.3 pg (ref 26.0–34.0)
MCHC: 30.4 g/dL (ref 30.0–36.0)
MCV: 90 fL (ref 80.0–100.0)
Monocytes Absolute: 0.4 10*3/uL (ref 0.1–1.0)
Monocytes Relative: 7 %
Neutro Abs: 3.3 10*3/uL (ref 1.7–7.7)
Neutrophils Relative %: 61 %
Platelet Count: 378 10*3/uL (ref 150–400)
RBC: 4.39 MIL/uL (ref 3.87–5.11)
RDW: 13.7 % (ref 11.5–15.5)
WBC Count: 5.4 10*3/uL (ref 4.0–10.5)
nRBC: 0 % (ref 0.0–0.2)

## 2023-11-25 LAB — RETIC PANEL
Immature Retic Fract: 5.4 % (ref 2.3–15.9)
RBC.: 4.34 MIL/uL (ref 3.87–5.11)
Retic Count, Absolute: 61.2 10*3/uL (ref 19.0–186.0)
Retic Ct Pct: 1.4 % (ref 0.4–3.1)
Reticulocyte Hemoglobin: 28.2 pg (ref >27.9)

## 2023-11-25 LAB — FERRITIN: Ferritin: 79 ng/mL (ref 11–307)

## 2023-11-25 LAB — IRON AND TIBC
Iron: 102 ug/dL (ref 28–170)
Saturation Ratios: 28 % (ref 10.4–31.8)
TIBC: 364 ug/dL (ref 250–450)
UIBC: 262 ug/dL

## 2023-11-25 LAB — VITAMIN B12: Vitamin B-12: 415 pg/mL (ref 180–914)

## 2023-11-25 NOTE — Telephone Encounter (Signed)
-----   Message from Nurse Doreatha C sent at 11/23/2023  1:24 PM EDT ----- Hey,  Patient reports not being able to afford Eliquis. She was prescribed it but decided not to start it due to cost. She is having an ablation on 9/15 and will be need to start on a blood thinner 4 weeks prior and remain on it for 3 months after. Can you check to see what her best option would be?  Thanks! Carly

## 2023-11-25 NOTE — Telephone Encounter (Signed)
  Patient has a deductible which is why the eliquis is so expensive.  Eliquis- $285.26 Dabigatran- $108.30 Xarelto-$297.00 Warfarin- $0.00  Warfarin would be the cheapest option. We could mail out an application to see if she qualifies for Eliquis or xarelto assistance but if she hasn't paid that 3% out of pocket then it will most likely not be approved. There is also the medicare payment plan option that she can set up with her insurance  Will follow up with application if needed

## 2023-11-29 ENCOUNTER — Inpatient Hospital Stay

## 2023-11-29 ENCOUNTER — Encounter: Payer: Self-pay | Admitting: Oncology

## 2023-11-29 ENCOUNTER — Inpatient Hospital Stay: Attending: Oncology | Admitting: Oncology

## 2023-11-29 VITALS — BP 128/82 | HR 81 | Resp 18

## 2023-11-29 VITALS — BP 128/62 | HR 82 | Temp 98.3°F | Resp 18 | Ht 62.0 in | Wt 157.0 lb

## 2023-11-29 DIAGNOSIS — D508 Other iron deficiency anemias: Secondary | ICD-10-CM

## 2023-11-29 DIAGNOSIS — D509 Iron deficiency anemia, unspecified: Secondary | ICD-10-CM | POA: Insufficient documentation

## 2023-11-29 DIAGNOSIS — Z9884 Bariatric surgery status: Secondary | ICD-10-CM | POA: Insufficient documentation

## 2023-11-29 MED ORDER — CYANOCOBALAMIN 1000 MCG/ML IJ SOLN: 1000.0000 ug | Freq: Once | INTRAMUSCULAR | Status: DC

## 2023-11-29 MED ORDER — DIPHENHYDRAMINE HCL 25 MG PO CAPS
50.0000 mg | ORAL_CAPSULE | Freq: Once | ORAL | Status: AC
  Administered 2023-11-29: 50 mg via ORAL
  Filled 2023-11-29: qty 2

## 2023-11-29 MED ORDER — LORAZEPAM 2 MG/ML IJ SOLN: 0.5000 mg | Freq: Once | INTRAMUSCULAR | Status: DC

## 2023-11-29 MED ORDER — IRON SUCROSE 20 MG/ML IV SOLN
200.0000 mg | Freq: Once | INTRAVENOUS | Status: AC
  Administered 2023-11-29: 200 mg via INTRAVENOUS
  Filled 2023-11-29: qty 10

## 2023-11-29 NOTE — Progress Notes (Signed)
 Patient had a Mammogram done on 11/01/2023.  Shortness of breath has worsened.  She is feeling more fatigued, she is unable to sleep at night.

## 2023-11-29 NOTE — Progress Notes (Signed)
 Patient here for IV venofer  with pre meds today. Pre meds ordered are benadryl  50 mg PO and ativan  0.5 mg IV. Her last infusion was 04/2023. Patient stated that she did well last time with no reaction or anxiety, and because she is driving today, she wants to try taking just the benadryl , and not the ativan . Benadryl  50 mg PO 30 minutes prior to venofer . Venofer  given as IV push over 5 minutes. Patient tolerated well. Patient monitored for 30 minutes post venofer  infusion. Patient discharged in ambulatory, stable condition.

## 2023-11-29 NOTE — Progress Notes (Signed)
 Hematology/Oncology Progress note [phone] [phone]      CHIEF COMPLAINTS/REASON FOR VISIT:  Follow up for iron  deficiency anemia.   ASSESSMENT & PLAN:   Iron  deficiency anemia Labs are reviewed and discussed with patient.  Lab Results  Component Value Date   HGB 12.0 11/25/2023   TIBC 364 11/25/2023   IRONPCTSAT 28 11/25/2023   FERRITIN 79 11/25/2023    Recommend Venofer  200mg  x 1 for maintenance.  She will get benadryl  and Ativan  as premed. Previous infusion reaction symptoms were felt to be due to anxiety.    H/O gastric bypass At risk of Vitamin deficiency.  Adequate B12, folate  continue sublingual B12 1000mcg   Orders Placed This Encounter  Procedures   CBC with Differential (Cancer Center Only)    Standing Status:   Future    Expected Date:   05/31/2024    Expiration Date:   08/29/2024   Iron  and TIBC    Standing Status:   Future    Expected Date:   05/31/2024    Expiration Date:   08/29/2024   Ferritin    Standing Status:   Future    Expected Date:   05/31/2024    Expiration Date:   08/29/2024   Vitamin B12    Standing Status:   Future    Expected Date:   05/31/2024    Expiration Date:   08/29/2024   Folate    Standing Status:   Future    Expected Date:   05/31/2024    Expiration Date:   08/29/2024   Follow up in 6 months.  All questions were answered. The patient knows to call the clinic with any problems, questions or concerns.  Zelphia Cap, MD, PhD [facility] Hematology Oncology 11/29/2023   HISTORY OF PRESENTING ILLNESS:  Shelby Colon is a 58 y.o. female who was seen in consultation at the request of Hope Merle, MD for evaluation of thromcbocytosis Reviewed patient's labs.  02/15/2019 Labs showed elevated platelet counts at 453, Hb 11.3,  wbc 7.3.   Reviewed patient's previous labs. Thrombocytosis onset is chronic onset , duration since 2019.  No aggravating or elevated factors. Associated symptoms or signs:  Denies weight loss,  fever, chills, fatigue, night sweats.  Context:  Smoking history: never smoker Family history of polycythemia. denies History of iron  deficiency anemia. Yes.  History of DVT, denies History of gastric bypass. Yes.  Had a colonoscopy 03/19/2019.   12/28/22 She has had SOB, sensation of throat tightness and coughing after receiving  Venofer  despite previously tolerated Venofer  in 2022. Symptoms include  She was given IV steroid, IV benadryl , and sent to ER where she received racemic epinephrine  nebulizer, IV pepcid , IV fluids.  There was no oropharyngeal edema. She appeared anxious and received Ativan . Symptoms resolved.  It was felt that her infusion reaction symptoms are due to anxiety.  Later, she was able to tolerate additional Venofer  treatments with benadryl  and ativan  as premed.   INTERVAL HISTORY Shelby Colon is a 58 y.o. female who has above history reviewed by me today presents for follow up visit for management of iron  deficiency anemia Today she reports feeling well.  No new complaints.  Chronic fatigue unchanged.  Sleep disturbance.  She has some shortness of breath with exertion.  She feels some improvement after previous IV Venofer  treatments.    Review of Systems  Constitutional:  Positive for fatigue. Negative for appetite change, chills and fever.  HENT:   Negative for hearing loss  and voice change.   Eyes:  Negative for eye problems.  Respiratory:  Negative for chest tightness and cough.   Cardiovascular:  Negative for chest pain.  Gastrointestinal:  Negative for abdominal distention, abdominal pain and blood in stool.  Endocrine: Negative for hot flashes.  Genitourinary:  Negative for difficulty urinating and frequency.   Musculoskeletal:  Negative for arthralgias.  Skin:  Negative for itching and rash.  Neurological:  Negative for extremity weakness.  Hematological:  Negative for adenopathy.  Psychiatric/Behavioral:  Negative for confusion.     MEDICAL HISTORY:   Past Medical History:  Diagnosis Date   Abnormal MRI, cervical spine 10/17/2019   FINDINGS: Alignment: Reversal of the normal cervical lordosis with apex at C3-4. Trace retrolisthesis of C4 on C5, C5 on C6, and C6 on C7, with anterolisthesis of C7 on T1. Findings chronic and facet mediated.   Vertebrae: Vertebral body height maintained without evidence for acute or chronic fracture. Bone marrow signal intensity within normal limits. No discrete or worrisome osseous lesions. Pro   Abnormal Pap smear of cervix    h/o LSIL pap with HPV +; 01/2019 pap negative negative HPV   Alcohol use 02/09/2022   Altered mental status 12/08/2021   Altered mental status 12/08/2021   Annual physical exam 06/12/2020   Anxiety    Anxiety 06/12/2020   Anxiety and depression 11/30/2017   Atrial fibrillation (HCC)    Atrophy of right kidney 03/05/2019   Left kidney ok   B12 deficiency    Benign neoplasm of ascending colon    Brain cyst 12/10/2021   Cardiac murmur 02/14/2018   as a child   Chronic knee pain (Bilateral) 09/07/2018   09/21/2018 left total knee ortho [place] Dr. [facility]    Chronic pain syndrome    Colon polyps    Confusion 12/10/2021   Congenital absence of right kidney    Degeneration of lumbar intervertebral disc    Back surgery, left leg knee down numb and tingling   Depression    Early menopause    Elevated alkaline phosphatase level 02/23/2019   Elevated BP without diagnosis of hypertension 08/04/2021   Last Assessment & Plan: Formatting of this note might be different from the original. BP at last visit was 131/85 and today is 156/88.  Advised patient to come back within the next 2 to 4 weeks to recheck her blood pressure.  Goal BP is less than 140/90.   Gallstones    Gastric bypass status for obesity    2013, weight 258lb   Grade I diastolic dysfunction    Headache    History of   Heart murmur    MVP   History of chicken pox    HPV in female 11/30/2017    Hypercholesterolemia    Iron  deficiency    Iron  deficiency anemia 03/02/2019   Mild mitral regurgitation by prior echocardiogram    Mild tricuspid regurgitation by prior echocardiogram    Neuromuscular disorder (HCC)    Neuropathy    Paroxysmal SVT (supraventricular tachycardia) (HCC)    Personal history of colonic polyps    Pharmacologic therapy 10/17/2019   Polyp of sigmoid colon    Problems influencing health status 10/17/2019   PTSD (post-traumatic stress disorder)    Rheumatoid arthritis (HCC)    as child    Scoliosis of lumbar spine    SIADH (syndrome of inappropriate ADH production) (HCC) 01/01/2022   Solitary kidney, congenital    pt thinks has left  kidney only no right kidney   Thrombocytosis 02/23/2019   Thyroid  disease    in the past    Transaminitis 12/08/2021   UTI (urinary tract infection)    Vitamin D  deficiency    Wears partial dentures    bottom    SURGICAL HISTORY: Past Surgical History:  Procedure Laterality Date   ANTERIOR CERVICAL DECOMPRESSION/DISCECTOMY FUSION 4 LEVELS N/A 11/28/2019   Procedure: ANTERIOR CERVICAL DECOMPRESSION/DISCECTOMY FUSION 4 LEVELS C3-7;  Surgeon: Clois Fret, MD;  Location: [facility] ORS;  Service: Neurosurgery;  Laterality: N/A;   AUGMENTATION MAMMAPLASTY Bilateral 2009   BACK SURGERY  11/04/2014   fusion of spine of lumbosacral region 2 rods and 16 screws    BREAST ENHANCEMENT SURGERY     COLONOSCOPY WITH PROPOFOL  N/A 01/06/2018   Procedure: COLONOSCOPY WITH PROPOFOL ;  Surgeon: Janalyn Keene NOVAK, MD;  Location: [facility] ENDOSCOPY;  Service: Endoscopy;  Laterality: N/A;   COLONOSCOPY WITH PROPOFOL  N/A 03/19/2019   Procedure: COLONOSCOPY WITH PROPOFOL ;  Surgeon: Janalyn Keene NOVAK, MD;  Location: [facility] ENDOSCOPY;  Service: Endoscopy;  Laterality: N/A;   COLONOSCOPY WITH PROPOFOL  N/A 02/24/2023   Procedure: COLONOSCOPY WITH PROPOFOL ;  Surgeon: Unk Corinn Skiff, MD;  Location: [facility];  Service: Endoscopy;   Laterality: N/A;   COSMETIC SURGERY     ESOPHAGOGASTRODUODENOSCOPY (EGD) WITH PROPOFOL  N/A 02/24/2023   Procedure: ESOPHAGOGASTRODUODENOSCOPY (EGD) WITH PROPOFOL ;  Surgeon: Unk Corinn Skiff, MD;  Location: [facility];  Service: Endoscopy;  Laterality: N/A;   excess skin removal     JOINT REPLACEMENT  09/20/2008   both knees done right was last year and left just done this   KNEE ARTHROSCOPY Bilateral    x 7 knees b/l (2 surgeries on right knee) total 1 bone graft and 5 arthroscopy total knee    POLYPECTOMY  02/24/2023   Procedure: POLYPECTOMY;  Surgeon: Unk Corinn Skiff, MD;  Location: [facility];  Service: Endoscopy;;   ROUX-EN-Y PROCEDURE     SPINE SURGERY  11/07/2014   2 rods and 16 screws   TOTAL KNEE ARTHROPLASTY     left 09/21/18 Dr. Amy II Geoffry Bury   TOTAL KNEE ARTHROPLASTY     right knee Dr. Curly Amy     SOCIAL HISTORY: Social History   Socioeconomic History   Marital status: Married    Spouse name: alex   Number of children: 0   Years of education: 16   Highest education level: Bachelor's degree (e.g., BA, AB, BS)  Occupational History   Not on file  Tobacco Use   Smoking status: Never   Smokeless tobacco: Never   Tobacco comments:    Never smoked  Vaping Use   Vaping status: Never Used  Substance and Sexual Activity   Alcohol use: Not Currently    Alcohol/week: 1.0 standard drink of alcohol    Types: 1 Glasses of wine per week    Comment: SOCIALLY   Drug use: Never   Sexual activity: Not Currently  Other Topics Concern   Not on file  Social History Narrative   Married since 2012    No kids    adopted   From [place] moved to [facility] and now lives here    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    BA degree    No guns, wears seat belt, safe in relationship    Social Drivers of Health   Financial Resource Strain: High Risk (11/10/2023)  Overall Financial Resource Strain (CARDIA)    Difficulty of Paying Living Expenses: Hard  Food Insecurity: Food Insecurity Present (11/10/2023)   Hunger Vital Sign    Worried About Running Out of Food in the Last Year: Sometimes true    Ran Out of Food in the Last Year: Never true  Transportation Needs: No Transportation Needs (11/10/2023)   PRAPARE - Administrator, Civil Service (Medical): No    Lack of Transportation (Non-Medical): No  Physical Activity: Inactive (11/10/2023)   Exercise Vital Sign    Days of Exercise per Week: 0 days    Minutes of Exercise per Session: Not on file  Stress: Stress Concern Present (11/10/2023)   [employer] of Occupational Health - Occupational Stress Questionnaire    Feeling of Stress: Very much  Social Connections: Moderately Isolated (11/10/2023)   Social Connection and Isolation Panel    Frequency of Communication with Friends and Family: Three times a week    Frequency of Social Gatherings with Friends and Family: Once a week    Attends Religious Services: Never    Database administrator or Organizations: No    Attends Engineer, structural: Not on file    Marital Status: Married  Catering manager Violence: Not At Risk (11/08/2023)   Humiliation, Afraid, Rape, and Kick questionnaire    Fear of Current or Ex-Partner: No    Emotionally Abused: No    Physically Abused: No    Sexually Abused: No    FAMILY HISTORY: Family History  Adopted: Yes  Problem Relation Age of Onset   Arthritis Mother    Drug abuse Mother        heriod OD   Early death Mother    Diabetes Father    Hypertension Father    Depression Father    Arthritis Father    Drug abuse Father    Diabetes Sister    Fibromyalgia Sister    Rashes / Skin problems Sister        PSORIASIS   Diabetes Brother    Osteoarthritis Maternal Grandmother    Aneurysm Maternal Aunt    Thyroid  disease Maternal Aunt    Fibromyalgia Maternal Aunt    Breast cancer  Maternal Aunt    Cancer Maternal Aunt        breast cancer     ALLERGIES:  is allergic to morphine.  MEDICATIONS:  Current Outpatient Medications  Medication Sig Dispense Refill   aspirin EC (ASPIRIN 81) 81 MG tablet Take 81 mg by mouth daily. Swallow whole.     Cyanocobalamin  (B-12) 1000 MCG SUBL Place 1 tablet under the tongue daily. 30 tablet 11   cyclobenzaprine  (FLEXERIL ) 10 MG tablet Take 1 tablet (10 mg total) by mouth 3 (three) times daily as needed for muscle spasms. This can make you sleepy. 60 tablet 0   diclofenac Sodium (VOLTAREN) 1 % GEL Apply 2 g topically as needed.     escitalopram  (LEXAPRO ) 5 MG tablet Take 1 tablet (5 mg total) by mouth daily. 90 tablet 1   fluticasone  (FLONASE ) 50 MCG/ACT nasal spray Place 2 sprays into both nostrils daily. 16 g 6   metoprolol  succinate (TOPROL -XL) 50 MG 24 hr tablet Take 1 tablet (50 mg total) by mouth daily. 30 tablet 3   pregabalin  (LYRICA ) 50 MG capsule TAKE ONE CAPSULE BY MOUTH TWICE A DAY AND TAKE ONE CAPSULE BY MOUTH AT BEDTIME 90 capsule 0   Vitamin D , Ergocalciferol , (DRISDOL ) 1.25 MG (50000 UNIT)  CAPS capsule Take 1 capsule (50,000 Units total) by mouth every 7 (seven) days. 12 capsule 1   VITAMIN D -VITAMIN K PO Take by mouth.     No current facility-administered medications for this visit.     PHYSICAL EXAMINATION: ECOG PERFORMANCE STATUS: 1 - Symptomatic but completely ambulatory Vitals:   11/29/23 1139  BP: 128/62  Pulse: 82  Resp: 18  Temp: 98.3 F (36.8 C)  SpO2: 99%   Filed Weights   11/29/23 1139  Weight: 157 lb (71.2 kg)    Physical Exam Constitutional:      General: She is not in acute distress. HENT:     Head: Normocephalic and atraumatic.   Eyes:     General: No scleral icterus.   Cardiovascular:     Rate and Rhythm: Normal rate and regular rhythm.  Pulmonary:     Effort: Pulmonary effort is normal.  Abdominal:     General: Bowel sounds are normal. There is no distension.      Palpations: Abdomen is soft.   Musculoskeletal:        General: No deformity. Normal range of motion.     Cervical back: Normal range of motion and neck supple.   Skin:    Findings: No erythema or rash.   Neurological:     Mental Status: She is alert and oriented to person, place, and time. Mental status is at baseline.     Cranial Nerves: No cranial nerve deficit.   Psychiatric:        Mood and Affect: Mood normal.      LABORATORY DATA:  I have reviewed the data as listed Lab Results  Component Value Date   WBC 5.4 11/25/2023   HGB 12.0 11/25/2023   HCT 39.5 11/25/2023   MCV 90.0 11/25/2023   PLT 378 11/25/2023   Recent Labs    02/01/23 1000 03/24/23 1011 05/16/23 1552 09/06/23 0929  NA 142  --  142 144  K 4.6  --  3.7 4.3  CL 105  --  106 106  CO2 24  --  27 23  GLUCOSE 74  --  71 91  BUN 19  --  17 12  CREATININE 0.88  --  0.98 0.74  CALCIUM 8.9  --  9.2 9.5  PROT 6.3 7.1 7.0 6.8  ALBUMIN 4.3 4.7 4.3 4.6  AST 55* 33 39* 25  ALT 53* 31 36* 21  ALKPHOS 129* 141* 119* 118  BILITOT <0.2 0.3 0.3 0.3  BILIDIR  --  <0.10  --   --    Iron /TIBC/Ferritin/ %Sat    Component Value Date/Time   IRON  102 11/25/2023 1044   IRON  122 03/24/2023 1011   TIBC 364 11/25/2023 1044   TIBC 328 03/24/2023 1011   FERRITIN 79 11/25/2023 1044   FERRITIN 236 (H) 03/24/2023 1011   IRONPCTSAT 28 11/25/2023 1044   IRONPCTSAT 37 03/24/2023 1011   IRONPCTSAT 31 12/05/2017 1044      RADIOGRAPHIC STUDIES: I have personally reviewed the radiological images as listed and agreed with the findings in the report. LONG TERM MONITOR (3-14 DAYS) Result Date: 11/22/2023 Patch Wear Time:  12 days and 4 hours (2025-06-04T10:29:47-0400 to 2025-06-16T15:04:59-0400) Patient had a min HR of 60 bpm, max HR of 222 bpm, and avg HR of 83 bpm. Predominant underlying rhythm was Sinus Rhythm. 239 Supraventricular Tachycardia runs occurred, the run with the fastest interval lasting 4 beats with a max  rate of 222 bpm, the longest lasting 18  beats with an avg rate of 141 bpm. Some episodes of Supraventricular Tachycardia may be possible Atrial Tachycardia with variable block. Atrial Fibrillation occurred (<1% burden), ranging from 108-178 bpm (avg of 142 bpm), the longest lasting 56 secs with an avg rate of 140 bpm. Supraventricular Tachycardia and Atrial Fibrillation were detected within +/- 45 seconds of symptomatic patient event(s). Isolated SVEs were rare (<1.0%, 9257), SVE Couplets were rare (<1.0%, 288), and SVE Triplets were rare (<1.0%, 209). Isolated VEs were rare (<1.0%), VE Couplets were rare (<1.0%), and no VE Triplets were present. Conclusion Average heart rate 83 bpm Paroxysmal atrial fibrillation less than 1% burden noted. Paroxysmal SVT noted.  ECHOCARDIOGRAM COMPLETE Result Date: 11/15/2023    ECHOCARDIOGRAM REPORT   Patient Name:   Shelby Colon Date of Exam: 11/15/2023 Medical Rec #:  [id number]     Height:       62.0 in Accession #:    [id number]    Weight:       161.4 lb Date of Birth:  1965-10-14     BSA:          1.745 m Patient Age:    57 years      BP:           118/78 mmHg Patient Gender: F             HR:           77 bpm. Exam Location:  [place] Procedure: 2D Echo, Cardiac Doppler and Color Doppler (Both Spectral and Color            Flow Doppler were utilized during procedure). Indications:    I48.0 Paroxysmal atrial fibrillation  History:        Patient has prior history of Echocardiogram examinations, most                 recent 03/22/2024. Mitral Valve Prolapse; Risk                 Factors:Hypertension.  Sonographer:    Alm Minerva Referring Phys: [id number] BRIAN AGBOR-ETANG IMPRESSIONS  1. Left ventricular ejection fraction, by estimation, is 55 to 60%. The left ventricle has normal function. The left ventricle has no regional wall motion abnormalities. Left ventricular diastolic parameters were normal. The average left ventricular global longitudinal strain is -17.9 %. The  global longitudinal strain is normal.  2. Right ventricular systolic function is normal. The right ventricular size is normal.  3. Left atrial size was mild to moderately dilated.  4. The mitral valve is normal in structure. Mild mitral valve regurgitation.  5. The aortic valve is tricuspid. Aortic valve regurgitation is not visualized. Aortic valve sclerosis is present, with no evidence of aortic valve stenosis.  6. The inferior vena cava is normal in size with greater than 50% respiratory variability, suggesting right atrial pressure of 3 mmHg. FINDINGS  Left Ventricle: Left ventricular ejection fraction, by estimation, is 55 to 60%. The left ventricle has normal function. The left ventricle has no regional wall motion abnormalities. The average left ventricular global longitudinal strain is -17.9 %. Strain was performed and the global longitudinal strain is normal. The left ventricular internal cavity size was normal in size. There is no left ventricular hypertrophy. Left ventricular diastolic parameters were normal. Right Ventricle: The right ventricular size is normal. No increase in right ventricular wall thickness. Right ventricular systolic function is normal. Left Atrium: Left atrial size was mild to moderately dilated. Right Atrium: Right atrial size was normal  in size. Pericardium: There is no evidence of pericardial effusion. Mitral Valve: The mitral valve is normal in structure. Mild mitral valve regurgitation. Tricuspid Valve: The tricuspid valve is normal in structure. Tricuspid valve regurgitation is mild. Aortic Valve: The aortic valve is tricuspid. Aortic valve regurgitation is not visualized. Aortic valve sclerosis is present, with no evidence of aortic valve stenosis. Aortic valve mean gradient measures 4.0 mmHg. Aortic valve peak gradient measures 7.5  mmHg. Aortic valve area, by VTI measures 1.89 cm. Pulmonic Valve: The pulmonic valve was normal in structure. Pulmonic valve regurgitation is  trivial. Aorta: The aortic root and ascending aorta are structurally normal, with no evidence of dilitation. Venous: The inferior vena cava is normal in size with greater than 50% respiratory variability, suggesting right atrial pressure of 3 mmHg. IAS/Shunts: No atrial level shunt detected by color flow Doppler.  LEFT VENTRICLE PLAX 2D LVIDd:         4.40 cm     Diastology LVIDs:         3.10 cm     LV e' medial:    6.31 cm/s LV PW:         1.00 cm     LV E/e' medial:  13.0 LV IVS:        0.80 cm     LV e' lateral:   10.90 cm/s LVOT diam:     2.10 cm     LV E/e' lateral: 7.5 LV SV:         57 LV SV Index:   33          2D Longitudinal Strain LVOT Area:     3.46 cm    2D Strain GLS Avg:     -17.9 %  LV Volumes (MOD) LV vol d, MOD A2C: 64.9 ml LV vol d, MOD A4C: 77.9 ml LV vol s, MOD A2C: 25.2 ml LV vol s, MOD A4C: 17.5 ml LV SV MOD A2C:     39.7 ml LV SV MOD A4C:     77.9 ml LV SV MOD BP:      50.2 ml RIGHT VENTRICLE RV Basal diam:  3.60 cm RV Mid diam:    2.20 cm RV S prime:     12.90 cm/s TAPSE (M-mode): 2.4 cm LEFT ATRIUM             Index        RIGHT ATRIUM           Index LA Vol (A2C):   52.0 ml 29.80 ml/m  RA Area:     17.70 cm LA Vol (A4C):   80.9 ml 46.36 ml/m  RA Volume:   47.00 ml  26.93 ml/m LA Biplane Vol: 66.9 ml 38.34 ml/m  AORTIC VALVE AV Area (Vmax):    1.89 cm AV Area (Vmean):   1.90 cm AV Area (VTI):     1.89 cm AV Vmax:           137.00 cm/s AV Vmean:          92.600 cm/s AV VTI:            0.305 m AV Peak Grad:      7.5 mmHg AV Mean Grad:      4.0 mmHg LVOT Vmax:         74.90 cm/s LVOT Vmean:        50.800 cm/s LVOT VTI:          0.166 m LVOT/AV VTI ratio: 0.54  AORTA Ao Sinus diam: 2.80 cm Ao Asc diam:   3.20 cm MITRAL VALVE               TRICUSPID VALVE MV Area (PHT): 3.42 cm    TR Peak grad:   20.2 mmHg MV Decel Time: 222 msec    TR Vmax:        225.00 cm/s MV E velocity: 81.80 cm/s MV A velocity: 94.30 cm/s  SHUNTS MV E/A ratio:  0.87        Systemic VTI:  0.17 m                             Systemic Diam: 2.10 cm Redell Cave MD Electronically signed by Redell Cave MD Signature Date/Time: 11/15/2023/12:50:06 PM    Final    MM 3D SCREEN BREAST W/IMPLANT BILATERAL Result Date: 11/07/2023 CLINICAL DATA:  Screening. EXAM: DIGITAL SCREENING BILATERAL MAMMOGRAM WITH IMPLANTS, CAD AND TOMOSYNTHESIS TECHNIQUE: Bilateral screening digital craniocaudal and mediolateral oblique mammograms were obtained. Bilateral screening digital breast tomosynthesis was performed. The images were evaluated with computer-aided detection. Standard and/or implant displaced views were performed. COMPARISON:  Previous exam(s). ACR Breast Density Category c: The breasts are heterogeneously dense, which may obscure small masses. FINDINGS: The patient has retropectoral implants. There are no findings suspicious for malignancy. IMPRESSION: No mammographic evidence of malignancy. A result letter of this screening mammogram will be mailed directly to the patient. RECOMMENDATION: Screening mammogram in one year. (Code:SM-B-01Y) BI-RADS CATEGORY  1: Negative. Electronically Signed   By: Norleen Croak M.D.   On: 11/07/2023 10:55

## 2023-11-29 NOTE — Assessment & Plan Note (Signed)
 At risk of Vitamin deficiency.  Adequate B12, folate  continue sublingual B12 1000mcg

## 2023-11-29 NOTE — Assessment & Plan Note (Signed)
 Labs are reviewed and discussed with patient.  Lab Results  Component Value Date   HGB 12.0 11/25/2023   TIBC 364 11/25/2023   IRONPCTSAT 28 11/25/2023   FERRITIN 79 11/25/2023    Recommend Venofer  200mg  x 1 for maintenance.  She will get benadryl  and Ativan  as premed. Previous infusion reaction symptoms were felt to be due to anxiety.

## 2023-12-06 ENCOUNTER — Other Ambulatory Visit (HOSPITAL_COMMUNITY): Payer: Self-pay

## 2023-12-06 MED ORDER — APIXABAN 5 MG PO TABS: 5.0000 mg | ORAL_TABLET | Freq: Two times a day (BID) | ORAL | 3 refills | Status: DC

## 2023-12-06 MED ORDER — APIXABAN 5 MG PO TABS
5.0000 mg | ORAL_TABLET | Freq: Two times a day (BID) | ORAL | 3 refills | Status: DC
  Filled 2023-12-06: qty 180, 90d supply, fill #0

## 2023-12-08 LAB — CBC
Hematocrit: 40.8 % (ref 34.0–46.6)
Hemoglobin: 12.2 g/dL (ref 11.1–15.9)
MCH: 27.2 pg (ref 26.6–33.0)
MCHC: 29.9 g/dL — ABNORMAL LOW (ref 31.5–35.7)
MCV: 91 fL (ref 79–97)
Platelets: 384 x10E3/uL (ref 150–450)
RBC: 4.48 x10E6/uL (ref 3.77–5.28)
RDW: 12.6 % (ref 11.7–15.4)
WBC: 5 x10E3/uL (ref 3.4–10.8)

## 2023-12-08 LAB — BASIC METABOLIC PANEL WITH GFR
BUN/Creatinine Ratio: 25 — ABNORMAL HIGH (ref 9–23)
BUN: 22 mg/dL (ref 6–24)
CO2: 21 mmol/L (ref 20–29)
Calcium: 9.3 mg/dL (ref 8.7–10.2)
Chloride: 105 mmol/L (ref 96–106)
Creatinine, Ser: 0.89 mg/dL (ref 0.57–1.00)
Glucose: 77 mg/dL (ref 70–99)
Potassium: 4.6 mmol/L (ref 3.5–5.2)
Sodium: 142 mmol/L (ref 134–144)
eGFR: 76 mL/min/1.73 (ref >59)

## 2023-12-15 ENCOUNTER — Encounter: Payer: Self-pay | Admitting: Cardiology

## 2023-12-15 ENCOUNTER — Ambulatory Visit: Attending: Cardiology | Admitting: Cardiology

## 2023-12-15 VITALS — BP 110/58 | HR 70 | Ht 62.0 in | Wt 163.6 lb

## 2023-12-15 DIAGNOSIS — I480 Paroxysmal atrial fibrillation: Secondary | ICD-10-CM | POA: Diagnosis not present

## 2023-12-15 DIAGNOSIS — I340 Nonrheumatic mitral (valve) insufficiency: Secondary | ICD-10-CM

## 2023-12-15 NOTE — Patient Instructions (Signed)
 Medication Instructions:  Your physician recommends that you continue on your current medications as directed. Please refer to the Current Medication list given to you today.   *If you need a refill on your cardiac medications before your next appointment, please call your pharmacy*  Lab Work: No labs ordered today  If you have labs (blood work) drawn today and your tests are completely normal, you will receive your results only by: MyChart Message (if you have MyChart) OR A paper copy in the mail If you have any lab test that is abnormal or we need to change your treatment, we will call you to review the results.  Testing/Procedures: No test ordered today   Follow-Up: At [facility], you and your health needs are our priority.  As part of our continuing mission to provide you with exceptional heart care, our providers are all part of one team.  This team includes your primary Cardiologist (physician) and Advanced Practice Providers or APPs (Physician Assistants and Nurse Practitioners) who all work together to provide you with the care you need, when you need it.  Your next appointment:   6 month(s)  Provider:   You may see Constancia Delton, MD or one of the following Advanced Practice Providers on your designated Care Team:   Laneta Pintos, NP Gildardo Labrador, PA-C Varney Gentleman, PA-C Cadence Kettering, PA-C Ronald Cockayne, NP Morey Ar, NP    We recommend signing up for the patient portal called "MyChart".  Sign up information is provided on this After Visit Summary.  MyChart is used to connect with patients for Virtual Visits (Telemedicine).  Patients are able to view lab/test results, encounter notes, upcoming appointments, etc.  Non-urgent messages can be sent to your provider as well.   To learn more about what you can do with MyChart, go to [email or web address].

## 2023-12-15 NOTE — Progress Notes (Signed)
 Cardiology Office Note:    Date:  12/15/2023   ID:  Shelby Colon, DOB 06/21/1965, MRN [id number]  PCP:  Hope Merle, MD (Inactive)   [facility] Providers Cardiologist:  Redell Cave, MD Electrophysiologist:  OLE ONEIDA HOLTS, MD     Referring MD: Hope Merle, MD   No chief complaint on file.   History of Present Illness:    Shelby Colon is a 58 y.o. female with a hx of anxiety, paroxysmal atrial fibrillation, chronic back pain who presents for follow-up.    She was last seen due to symptoms of palpitations and paroxysmal atrial fibrillation.  Toprol -XL was increased to 50 mg daily with good effect.  Palpitations still occur although now infrequent.  Followed up with electrophysiology, A-fib ablation being planned.  Currently on and tolerating Eliquis  with no significant bleeding issues.  Prior notes Echo reviewed by myself 02/2022 EF 60 to 65%, bicuspid aortic valve, mild MR, Cardiac monitor 02/2022 paroxysmal SVTs, PACs.  Past Medical History:  Diagnosis Date   Abnormal MRI, cervical spine 10/17/2019   FINDINGS: Alignment: Reversal of the normal cervical lordosis with apex at C3-4. Trace retrolisthesis of C4 on C5, C5 on C6, and C6 on C7, with anterolisthesis of C7 on T1. Findings chronic and facet mediated.   Vertebrae: Vertebral body height maintained without evidence for acute or chronic fracture. Bone marrow signal intensity within normal limits. No discrete or worrisome osseous lesions. Pro   Abnormal Pap smear of cervix    h/o LSIL pap with HPV +; 01/2019 pap negative negative HPV   Alcohol use 02/09/2022   Altered mental status 12/08/2021   Altered mental status 12/08/2021   Annual physical exam 06/12/2020   Anxiety    Anxiety 06/12/2020   Anxiety and depression 11/30/2017   Atrial fibrillation (HCC)    Atrophy of right kidney 03/05/2019   Left kidney ok   B12 deficiency    Benign neoplasm of ascending colon    Brain cyst 12/10/2021   Cardiac  murmur 02/14/2018   as a child   Chronic knee pain (Bilateral) 09/07/2018   09/21/2018 left total knee ortho [place] [facility] OP    Chronic pain syndrome    Colon polyps    Confusion 12/10/2021   Congenital absence of right kidney    Degeneration of lumbar intervertebral disc    Back surgery, left leg knee down numb and tingling   Depression    Early menopause    Elevated alkaline phosphatase level 02/23/2019   Elevated BP without diagnosis of hypertension 08/04/2021   Last Assessment & Plan: Formatting of this note might be different from the original. BP at last visit was 131/85 and today is 156/88.  Advised patient to come back within the next 2 to 4 weeks to recheck her blood pressure.  Goal BP is less than 140/90.   Gallstones    Gastric bypass status for obesity    2013, weight 258lb   Grade I diastolic dysfunction    Headache    History of   Heart murmur    MVP   History of chicken pox    HPV in female 11/30/2017   Hypercholesterolemia    Iron  deficiency    Iron  deficiency anemia 03/02/2019   Mild mitral regurgitation by prior echocardiogram    Mild tricuspid regurgitation by prior echocardiogram    Neuromuscular disorder (HCC)    Neuropathy    Paroxysmal SVT (supraventricular tachycardia) (HCC)    Personal history  of colonic polyps    Pharmacologic therapy 10/17/2019   Polyp of sigmoid colon    Problems influencing health status 10/17/2019   PTSD (post-traumatic stress disorder)    Rheumatoid arthritis (HCC)    as child    Scoliosis of lumbar spine    SIADH (syndrome of inappropriate ADH production) (HCC) 01/01/2022   Solitary kidney, congenital    pt thinks has left kidney only no right kidney   Thrombocytosis 02/23/2019   Thyroid  disease    in the past    Transaminitis 12/08/2021   UTI (urinary tract infection)    Vitamin D  deficiency    Wears partial dentures    bottom    Past Surgical History:  Procedure Laterality Date    ANTERIOR CERVICAL DECOMPRESSION/DISCECTOMY FUSION 4 LEVELS N/A 11/28/2019   Procedure: ANTERIOR CERVICAL DECOMPRESSION/DISCECTOMY FUSION 4 LEVELS C3-7;  Surgeon: Clois Fret, MD;  Location: [facility] ORS;  Service: Neurosurgery;  Laterality: N/A;   AUGMENTATION MAMMAPLASTY Bilateral 2009   BACK SURGERY  11/04/2014   fusion of spine of lumbosacral region 2 rods and 16 screws    BREAST ENHANCEMENT SURGERY     COLONOSCOPY WITH PROPOFOL  N/A 01/06/2018   Procedure: COLONOSCOPY WITH PROPOFOL ;  Surgeon: Janalyn Keene NOVAK, MD;  Location: [facility] ENDOSCOPY;  Service: Endoscopy;  Laterality: N/A;   COLONOSCOPY WITH PROPOFOL  N/A 03/19/2019   Procedure: COLONOSCOPY WITH PROPOFOL ;  Surgeon: Janalyn Keene NOVAK, MD;  Location: [facility] ENDOSCOPY;  Service: Endoscopy;  Laterality: N/A;   COLONOSCOPY WITH PROPOFOL  N/A 02/24/2023   Procedure: COLONOSCOPY WITH PROPOFOL ;  Surgeon: Unk Corinn Skiff, MD;  Location: [facility];  Service: Endoscopy;  Laterality: N/A;   COSMETIC SURGERY     ESOPHAGOGASTRODUODENOSCOPY (EGD) WITH PROPOFOL  N/A 02/24/2023   Procedure: ESOPHAGOGASTRODUODENOSCOPY (EGD) WITH PROPOFOL ;  Surgeon: Unk Corinn Skiff, MD;  Location: [facility];  Service: Endoscopy;  Laterality: N/A;   excess skin removal     JOINT REPLACEMENT  09/20/2008   both knees done right was last year and left just done this   KNEE ARTHROSCOPY Bilateral    x 7 knees b/l (2 surgeries on right knee) total 1 bone graft and 5 arthroscopy total knee    POLYPECTOMY  02/24/2023   Procedure: POLYPECTOMY;  Surgeon: Unk Corinn Skiff, MD;  Location: [facility];  Service: Endoscopy;;   ROUX-EN-Y PROCEDURE     SPINE SURGERY  11/07/2014   2 rods and 16 screws   TOTAL KNEE ARTHROPLASTY     left 09/21/18 Dr. Amy II Geoffry Bury   TOTAL KNEE ARTHROPLASTY     right knee Dr. Curly Amy     Current Medications: Current Meds  Medication Sig   apixaban  (ELIQUIS ) 5 MG TABS tablet Take 1  tablet (5 mg total) by mouth 2 (two) times daily.   Cyanocobalamin  (B-12) 1000 MCG SUBL Place 1 tablet under the tongue daily.   cyclobenzaprine  (FLEXERIL ) 10 MG tablet Take 1 tablet (10 mg total) by mouth 3 (three) times daily as needed for muscle spasms. This can make you sleepy.   diclofenac Sodium (VOLTAREN) 1 % GEL Apply 2 g topically as needed.   escitalopram  (LEXAPRO ) 5 MG tablet Take 1 tablet (5 mg total) by mouth daily.   fluticasone  (FLONASE ) 50 MCG/ACT nasal spray Place 2 sprays into both nostrils daily.   metoprolol  succinate (TOPROL -XL) 50 MG 24 hr tablet Take 1 tablet (50 mg total) by mouth daily.   pregabalin  (LYRICA ) 50 MG capsule TAKE ONE CAPSULE BY MOUTH TWICE A  DAY AND TAKE ONE CAPSULE BY MOUTH AT BEDTIME   Vitamin D , Ergocalciferol , (DRISDOL ) 1.25 MG (50000 UNIT) CAPS capsule Take 1 capsule (50,000 Units total) by mouth every 7 (seven) days.   VITAMIN D -VITAMIN K PO Take by mouth.     Allergies:   Morphine   Social History   Socioeconomic History   Marital status: Married    Spouse name: alex   Number of children: 0   Years of education: 16   Highest education level: Bachelor's degree (e.g., BA, AB, BS)  Occupational History   Not on file  Tobacco Use   Smoking status: Never   Smokeless tobacco: Never   Tobacco comments:    Never smoked  Vaping Use   Vaping status: Never Used  Substance and Sexual Activity   Alcohol use: Not Currently    Alcohol/week: 1.0 standard drink of alcohol    Types: 1 Glasses of wine per week    Comment: SOCIALLY   Drug use: Never   Sexual activity: Not Currently  Other Topics Concern   Not on file  Social History Narrative   Married since 2012    No kids    adopted   From [place] moved to [facility] and now lives here    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    BA degree    No guns, wears seat belt, safe in relationship    Social Drivers of Health    Financial Resource Strain: High Risk (11/10/2023)   Overall Financial Resource Strain (CARDIA)    Difficulty of Paying Living Expenses: Hard  Food Insecurity: Food Insecurity Present (11/10/2023)   Hunger Vital Sign    Worried About Running Out of Food in the Last Year: Sometimes true    Ran Out of Food in the Last Year: Never true  Transportation Needs: No Transportation Needs (11/10/2023)   PRAPARE - Administrator, Civil Service (Medical): No    Lack of Transportation (Non-Medical): No  Physical Activity: Inactive (11/10/2023)   Exercise Vital Sign    Days of Exercise per Week: 0 days    Minutes of Exercise per Session: Not on file  Stress: Stress Concern Present (11/10/2023)   [employer] of Occupational Health - Occupational Stress Questionnaire    Feeling of Stress: Very much  Social Connections: Moderately Isolated (11/10/2023)   Social Connection and Isolation Panel    Frequency of Communication with Friends and Family: Three times a week    Frequency of Social Gatherings with Friends and Family: Once a week    Attends Religious Services: Never    Database administrator or Organizations: No    Attends Engineer, structural: Not on file    Marital Status: Married     Family History: The patient's family history includes Aneurysm in her maternal aunt; Arthritis in her father and mother; Breast cancer in her maternal aunt; Cancer in her maternal aunt; Depression in her father; Diabetes in her brother, father, and sister; Drug abuse in her father and mother; Early death in her mother; Fibromyalgia in her maternal aunt and sister; Hypertension in her father; Osteoarthritis in her maternal grandmother; Rashes / Skin problems in her sister; Thyroid  disease in her maternal aunt. She was adopted.  ROS:   Please see the history of present illness.     All other systems reviewed and are negative.  EKGs/Labs/Other Studies Reviewed:    The following studies  were  reviewed today:        Recent Labs: 05/16/2023: TSH 2.69 09/06/2023: ALT 21 12/07/2023: BUN 22; Creatinine, Ser 0.89; Hemoglobin 12.2; Platelets 384; Potassium 4.6; Sodium 142  Recent Lipid Panel    Component Value Date/Time   CHOL 184 02/15/2019 1004   TRIG 55 02/15/2019 1004   HDL 117 02/15/2019 1004   CHOLHDL 1.6 02/15/2019 1004   CHOLHDL 1.8 12/05/2017 1044   LDLCALC 56 02/15/2019 1004   LDLCALC 74 12/05/2017 1044     Risk Assessment/Calculations:        Physical Exam:    VS:  BP (!) 110/58 (BP Location: Left Arm, Patient Position: Sitting)   Pulse 70   Ht 5' 2 (1.575 m)   Wt 163 lb 9.6 oz (74.2 kg)   SpO2 98%   BMI 29.92 kg/m     Wt Readings from Last 3 Encounters:  12/15/23 163 lb 9.6 oz (74.2 kg)  11/29/23 157 lb (71.2 kg)  11/23/23 163 lb (73.9 kg)     GEN:  Well nourished, well developed in no acute distress HEENT: Normal NECK: No JVD; No carotid bruits CARDIAC: RRR, no murmurs, rubs, gallops RESPIRATORY:  Clear to auscultation without rales, wheezing or rhonchi  ABDOMEN: Soft, non-tender, non-distended MUSCULOSKELETAL:  No edema; No deformity  SKIN: Warm and dry NEUROLOGIC:  Alert and oriented x 3 PSYCHIATRIC:  Normal affect   ASSESSMENT:    1. Paroxysmal atrial fibrillation (HCC)   2. Mitral valve insufficiency, unspecified etiology    PLAN:    In order of problems listed above:  Paroxysmal atrial fibrillation, echo 6/25 EF 55 to 60%, normal diastolic function.  CHA2DS2-VASc=1 (female).  Continue Toprol -XL 50 mg daily, Eliquis  5 mg twice daily.  Appreciate input from EP ablation is planned for next month. Mild MR, no evidence for mitral valve prolapse.  Aortic valve is tricuspid.  Follow-up in 6 months.     Medication Adjustments/Labs and Tests Ordered: Current medicines are reviewed at length with the patient today.  Concerns regarding medicines are outlined above.  No orders of the defined types were placed in this encounter.  No  orders of the defined types were placed in this encounter.   Patient Instructions  Medication Instructions:  Your physician recommends that you continue on your current medications as directed. Please refer to the Current Medication list given to you today.   *If you need a refill on your cardiac medications before your next appointment, please call your pharmacy*  Lab Work: No labs ordered today  If you have labs (blood work) drawn today and your tests are completely normal, you will receive your results only by: MyChart Message (if you have MyChart) OR A paper copy in the mail If you have any lab test that is abnormal or we need to change your treatment, we will call you to review the results.  Testing/Procedures: No test ordered today   Follow-Up: At [facility], you and your health needs are our priority.  As part of our continuing mission to provide you with exceptional heart care, our providers are all part of one team.  This team includes your primary Cardiologist (physician) and Advanced Practice Providers or APPs (Physician Assistants and Nurse Practitioners) who all work together to provide you with the care you need, when you need it.  Your next appointment:   6 month(s)  Provider:   You may see Redell Cave, MD or one of the following Advanced Practice Providers on your  designated Care Team:   Lonni Meager, NP Lesley Maffucci, PA-C Bernardino Bring, PA-C Cadence Franchester, PA-C Tylene Lunch, NP Barnie Hila, NP    We recommend signing up for the patient portal called MyChart.  Sign up information is provided on this After Visit Summary.  MyChart is used to connect with patients for Virtual Visits (Telemedicine).  Patients are able to view lab/test results, encounter notes, upcoming appointments, etc.  Non-urgent messages can be sent to your provider as well.   To learn more about what you can do with MyChart, go to [email or web address].          Signed, Redell Cave, MD  12/15/2023 12:22 PM     [facility]

## 2023-12-28 ENCOUNTER — Other Ambulatory Visit: Payer: Self-pay | Admitting: Medical Genetics

## 2024-01-02 ENCOUNTER — Other Ambulatory Visit
Admission: RE | Admit: 2024-01-02 | Discharge: 2024-01-02 | Disposition: A | Discharge status: Home / Self Care | Payer: Self-pay | Source: Ambulatory Visit | Attending: Medical Genetics | Admitting: Medical Genetics

## 2024-01-02 ENCOUNTER — Encounter: Payer: Self-pay | Admitting: Cardiology

## 2024-01-02 NOTE — Telephone Encounter (Signed)
 Called patient and left message for cal back.

## 2024-01-03 NOTE — Telephone Encounter (Signed)
 Spoke with patient states that she had elevated heart rate last evening from 120-130s.   The patient was taking metoprolol , and a change in her medication schedule was made to better control her heart rate. Previously, she was taking a 50 mg dose once daily, as prescribed by Dr. Darliss. After a visit with Dr. Cindie, she was advised to split the 50 mg tablet and take 25 mg in the morning with her new Eliquis  prescription and the other 25 mg in the evening.  The patient was concerned about this change. It was explained to her that she was still taking the same total daily dose of 50 mg, but by splitting the dose and taking it twice a day, she would have more continuous therapeutic effects, which would help with her elevated heart rate. This is a common practice for immediate-release metoprolol  (metoprolol  tartrate), as it has a shorter duration of action and is often dosed twice daily to maintain a more consistent effect. Extended-release metoprolol  (metoprolol  succinate), on the other hand, is designed to be taken once a day.  Pt was appreciative of the explanation and the phone call.

## 2024-01-04 ENCOUNTER — Ambulatory Visit: Admitting: Nurse Practitioner

## 2024-01-04 ENCOUNTER — Encounter: Payer: Self-pay | Admitting: Nurse Practitioner

## 2024-01-04 VITALS — BP 122/74 | HR 85 | Resp 16 | Ht 60.0 in | Wt 159.2 lb

## 2024-01-04 DIAGNOSIS — M51372 Other intervertebral disc degeneration, lumbosacral region with discogenic back pain and lower extremity pain: Secondary | ICD-10-CM

## 2024-01-04 DIAGNOSIS — M899 Disorder of bone, unspecified: Secondary | ICD-10-CM

## 2024-01-04 DIAGNOSIS — M4327 Fusion of spine, lumbosacral region: Secondary | ICD-10-CM

## 2024-01-04 DIAGNOSIS — I10 Essential (primary) hypertension: Secondary | ICD-10-CM | POA: Diagnosis not present

## 2024-01-04 DIAGNOSIS — G930 Cerebral cysts: Secondary | ICD-10-CM

## 2024-01-04 DIAGNOSIS — I341 Nonrheumatic mitral (valve) prolapse: Secondary | ICD-10-CM | POA: Diagnosis not present

## 2024-01-04 DIAGNOSIS — G629 Polyneuropathy, unspecified: Secondary | ICD-10-CM

## 2024-01-04 DIAGNOSIS — E66811 Obesity, class 1: Secondary | ICD-10-CM | POA: Insufficient documentation

## 2024-01-04 DIAGNOSIS — F339 Major depressive disorder, recurrent, unspecified: Secondary | ICD-10-CM

## 2024-01-04 DIAGNOSIS — E7800 Pure hypercholesterolemia, unspecified: Secondary | ICD-10-CM

## 2024-01-04 DIAGNOSIS — M5030 Other cervical disc degeneration, unspecified cervical region: Secondary | ICD-10-CM

## 2024-01-04 DIAGNOSIS — D508 Other iron deficiency anemias: Secondary | ICD-10-CM

## 2024-01-04 DIAGNOSIS — E871 Hypo-osmolality and hyponatremia: Secondary | ICD-10-CM

## 2024-01-04 DIAGNOSIS — M5134 Other intervertebral disc degeneration, thoracic region: Secondary | ICD-10-CM

## 2024-01-04 DIAGNOSIS — G894 Chronic pain syndrome: Secondary | ICD-10-CM

## 2024-01-04 DIAGNOSIS — R053 Chronic cough: Secondary | ICD-10-CM

## 2024-01-04 MED ORDER — FLUTICASONE PROPIONATE 50 MCG/ACT NA SUSP: 2.0000 | Freq: Every day | NASAL | 6 refills | Status: AC

## 2024-01-04 NOTE — Progress Notes (Signed)
 BP 122/74   Pulse 85   Resp 16   Ht 5' (1.524 m) Comment: measured  Wt 159 lb 3.2 oz (72.2 kg)   SpO2 100%   BMI 31.09 kg/m    Subjective:    Patient ID: Shelby Colon, female    DOB: 05-Aug-1965, 58 y.o.   MRN: [id number]  HPI: Shelby Colon is a 58 y.o. female  Chief Complaint  Patient presents with   Establish Care   Weight Gain    Pt feels has gained weight in a short amount of time    Discussed the use of AI scribe software for clinical note transcription with the patient, who gave verbal consent to proceed.  History of Present Illness Shelby Colon is a 58 year old female with atrial fibrillation and mitral valve insufficiency who presents with concerns about weight gain. She is accompanied by her husband.  Weight gain and nutritional status - Progressive weight gain over the past few years, with weight increasing from 151 to 166 pounds - Weight gain temporally associated with initiation of an antihypertensive medication in addition to metoprolol  - No significant changes in dietary intake; eating habits described as minimal with frequent skipped meals due to poor appetite - History of gastric bypass surgery - Recent dietary modifications with macro tracking and increased protein intake resulting in a 10-pound weight loss - Ongoing concerns regarding nutritional status, including iron  deficiency anemia, vitamin D  deficiency, and B12 deficiency; followed by hematology - Recent CBC and BMP within normal limits  Atrial fibrillation and mitral valve insufficiency - History of atrial fibrillation and mitral valve insufficiency, followed by cardiology - Recent significant episode of atrial fibrillation while in [place] , characterized by palpitations, headache, and dizziness, requiring hospitalization - Current medications: Eliquis  5 mg twice daily and metoprolol  50 mg daily (split into two doses)  Chronic back pain and sacroiliac joint dysfunction - Chronic back pain with  sacroiliac joint involvement - History of spinal fusion surgery - Audible 'popping' sounds in the back - Discussion of possible further spinal surgery due to missing screws in the sacrum - Current medications: cyclobenzaprine  10 mg three times daily as needed for muscle spasms, Lyrica  50 mg twice daily for pain management  Chronic pain syndrome and degenerative joint disease - History of degenerative disc disease and chronic pain syndrome impacting daily activities and quality of life - Bilateral knee arthroplasties - Increased leg pain associated with weight gain  Mood disturbance and anxiety - History of depression and anxiety - Recently started Lexapro  5 mg daily - Partial improvement in mood, with persistent irritability and stress, particularly related to family dynamics and her mother's health issues  Hyponatremia and siadh - Followed by endocrinology for hyponatremia and syndrome of inappropriate antidiuretic hormone secretion (SIADH)  Peripheral neuropathy and neurological symptoms - History of idiopathic peripheral neuropathy and arachnoid cyst, followed by neurology - Imbalance, with close monitoring of symptoms    Filed Weights   01/04/24 0912  Weight: 159 lb 3.2 oz (72.2 kg)   Body mass index is 31.09 kg/m.  Waist Measurement : 31.5 inches      01/04/2024    9:09 AM 11/10/2023    1:23 PM 11/08/2023   11:47 AM  Depression screen PHQ 2/9  Decreased Interest 1 2 1   Down, Depressed, Hopeless 1 3 3   PHQ - 2 Score 2 5 4   Altered sleeping 1 2 3   Tired, decreased energy 1 3 2   Change in appetite 1 3 1   Feeling  bad or failure about yourself  1 3 3   Trouble concentrating 1 0 0  Moving slowly or fidgety/restless 0 2 0  Suicidal thoughts 0 0 0  PHQ-9 Score 7 18 13   Difficult doing work/chores Somewhat difficult Very difficult Extremely dIfficult    Relevant past medical, surgical, family and social history reviewed and updated as indicated. Interim medical history since  our last visit reviewed. Allergies and medications reviewed and updated.  Review of Systems  Constitutional: Negative for fever, positive weight change.  Respiratory: Negative for cough and shortness of breath.   Cardiovascular: Negative for chest pain or palpitations.  Gastrointestinal: Negative for abdominal pain, no bowel changes.  Musculoskeletal: positive for gait problem or joint swelling.  Skin: Negative for rash.  Neurological: Negative for dizziness or headache.  No other specific complaints in a complete review of systems (except as listed in HPI above).      Objective:     BP 122/74   Pulse 85   Resp 16   Ht 5' (1.524 m) Comment: measured  Wt 159 lb 3.2 oz (72.2 kg)   SpO2 100%   BMI 31.09 kg/m    Wt Readings from Last 3 Encounters:  01/04/24 159 lb 3.2 oz (72.2 kg)  12/15/23 163 lb 9.6 oz (74.2 kg)  11/29/23 157 lb (71.2 kg)    Physical Exam Physical Exam GENERAL: Alert, cooperative, well developed, no acute distress HEENT: Normocephalic, normal oropharynx, moist mucous membranes CHEST: Clear to auscultation bilaterally, no wheezes, rhonchi, or crackles CARDIOVASCULAR: Normal heart rate and rhythm, S1 and S2 normal without murmurs ABDOMEN: Soft, non-tender, non-distended, without organomegaly, normal bowel sounds EXTREMITIES: No cyanosis or edema NEUROLOGICAL: Cranial nerves grossly intact, moves all extremities without gross motor or sensory deficit   Results for orders placed or performed in visit on 11/25/23  Vitamin B12   Collection Time: 11/25/23 10:43 AM  Result Value Ref Range   Vitamin B-12 415 180 - 914 pg/mL  Retic Panel   Collection Time: 11/25/23 10:44 AM  Result Value Ref Range   Retic Ct Pct 1.4 0.4 - 3.1 %   RBC. 4.34 3.87 - 5.11 MIL/uL   Retic Count, Absolute 61.2 19.0 - 186.0 K/uL   Immature Retic Fract 5.4 2.3 - 15.9 %   Reticulocyte Hemoglobin 28.2 >27.9 pg  Ferritin   Collection Time: 11/25/23 10:44 AM  Result Value Ref Range    Ferritin 79 11 - 307 ng/mL  CBC with Differential (Cancer Center Only)   Collection Time: 11/25/23 10:44 AM  Result Value Ref Range   WBC Count 5.4 4.0 - 10.5 K/uL   RBC 4.39 3.87 - 5.11 MIL/uL   Hemoglobin 12.0 12.0 - 15.0 g/dL   HCT 60.4 63.9 - 53.9 %   MCV 90.0 80.0 - 100.0 fL   MCH 27.3 26.0 - 34.0 pg   MCHC 30.4 30.0 - 36.0 g/dL   RDW 86.2 88.4 - 84.4 %   Platelet Count 378 150 - 400 K/uL   nRBC 0.0 0.0 - 0.2 %   Neutrophils Relative % 61 %   Neutro Abs 3.3 1.7 - 7.7 K/uL   Lymphocytes Relative 27 %   Lymphs Abs 1.4 0.7 - 4.0 K/uL   Monocytes Relative 7 %   Monocytes Absolute 0.4 0.1 - 1.0 K/uL   Eosinophils Relative 4 %   Eosinophils Absolute 0.2 0.0 - 0.5 K/uL   Basophils Relative 1 %   Basophils Absolute 0.1 0.0 - 0.1 K/uL   Immature  Granulocytes 0 %   Abs Immature Granulocytes 0.01 0.00 - 0.07 K/uL  Iron  and TIBC   Collection Time: 11/25/23 10:44 AM  Result Value Ref Range   Iron  102 28 - 170 ug/dL   TIBC 635 749 - 549 ug/dL   Saturation Ratios 28 10.4 - 31.8 %   UIBC 262 ug/dL          Assessment & Plan:   Problem List Items Addressed This Visit       Cardiovascular and Mediastinum   MVP (mitral valve prolapse)   HTN (hypertension) - Primary     Nervous and Auditory   Neuropathy   Arachnoid cyst     Musculoskeletal and Integument   DDD (degenerative disc disease), lumbosacral (Chronic)   Disorder of skeletal system (Chronic)   DDD (degenerative disc disease), cervical (Chronic)   DDD (degenerative disc disease), thoracic (Chronic)   Fusion of spine of lumbosacral region     Other   Chronic pain syndrome (Chronic)   Iron  deficiency anemia (Chronic)   Hypercholesteremia   Depression, recurrent (HCC)   Hyponatremia   Class 1 obesity due to excess calories with serious comorbidity and body mass index (BMI) of 31.0 to 31.9 in adult     Assessment and Plan Assessment & Plan Obesity status post gastric bypass Weight gain despite recent dietary  changes focusing on macros, resulting in a 10-pound weight loss. Concerns about medication-related weight gain, particularly with Lexapro  and Lyrica . Lexapro  is a potential contributor, but weight gain started before its initiation. Lyrica  may cause swelling, contributing to weight gain. - Continue dietary changes focusing on macros - Reassess weight management in 6 months  Atrial fibrillation and mitral valve insufficiency Episodes of palpitations and tachycardia. On Eliquis  and metoprolol , with recent adjustment to metoprolol  dosing to 25 mg twice daily due to nighttime symptoms. Scheduled for cardiac ablation in September. Concerns about medication costs and adherence due to financial constraints. - Continue Eliquis  and metoprolol  as prescribed - Proceed with scheduled cardiac ablation in September  Chronic back pain with SI joint involvement and degenerative disc disease Chronic back pain with SI joint involvement and history of spinal surgeries, including spinal fusion. Current management includes Lyrica  and cyclobenzaprine . Further surgical intervention on hold due to anticoagulation therapy for atrial fibrillation. - Continue Lyrica  and cyclobenzaprine  as needed  Chronic pain syndrome Managed with Lyrica  and cyclobenzaprine . Pain exacerbated by weight gain and stress. Current management includes medication and dietary changes.  Depression and anxiety Recently started on Lexapro  5 mg daily. Reports some improvement but still experiences irritability and stress, particularly related to family dynamics and health issues. Engaged in therapy for stress management. - Continue Lexapro  5 mg daily - Continue therapy for stress management and coping strategies       Healthy Weight Loss Guide ?? Weight Loss Goal - Aim for 1-2 pounds per week - Target: 5-10% of your starting body weight over 3-6 months ??? Nutrition Tips -aim for 1600-1800 calories - can try intermittent fasting - Fill half  your plate with vegetables, a quarter with protein, a quarter with whole grains - Choose lean proteins: chicken, fish, eggs, tofu, beans - Limit: - Sugary drinks (soda, sweet tea, juice) - Fried foods and fast food - Processed snacks (chips, candy, cookies) - Drink at least 64 oz of water  per day - Practice portion control and mindful eating ???? Lifestyle Habits - Track what you eat (apps like MyFitnessPal, Lose It!, or a paper log) - Get 7-9  hours of sleep per night - Manage stress (meditation, breathing exercises, counseling if needed) - Limit alcohol (empty calories and may increase hunger) ???? Exercise Recommendations - Goal: 150 minutes per week of moderate activity (e.g., brisk walking, cycling) - Start with 10-15 minutes/day and build up gradually - Add 2 days per week of strength training (light weights, resistance bands, or bodyweight) ?? Remember: Progress > Perfection Small changes every day add up. Don't give up! - Avoid high-fat or greasy foods to reduce nausea - Focus on protein at each meal to preserve muscle mass - Stay well hydrated (at least 64 oz water  per day) - Limit sugar and processed carbohydrates ?? Managing Side Effects if on weight loss medication - Eat slowly and stop eating when you feel full - Use anti-nausea strategies: ginger tea, peppermint, crackers - Talk to your provider about adjusting the dose if needed - Stool softeners or fiber supplements can help with constipation ?? Staying on Track - Track weight and non-scale victories (energy, clothing fit, labs) - Follow up with your provider regularly - Don't stop medication without medical guidance - Combine medication with healthy habits for best results ?? Remember Weight loss medications are a tool, not a shortcut. Healthy habits matter. Be patient and consistent--small changes lead to big results.  Follow up plan: Return in about 6 months (around 07/06/2024) for follow up.

## 2024-01-04 NOTE — Patient Instructions (Addendum)
 Healthy Weight Loss Guide ?? Weight Loss Goal - Aim for 1-2 pounds per week - Target: 5-10% of your starting body weight over 3-6 months ??? Nutrition Tips -aim for 1400-1600 calories - can try intermittent fasting - Fill half your plate with vegetables, a quarter with protein, a quarter with whole grains - Choose lean proteins: chicken, fish, eggs, tofu, beans - Limit: - Sugary drinks (soda, sweet tea, juice) - Fried foods and fast food - Processed snacks (chips, candy, cookies) - Drink at least 64 oz of water  per day - Practice portion control and mindful eating ???? Lifestyle Habits - Track what you eat (apps like MyFitnessPal, Lose It!, or a paper log) - Get 7-9 hours of sleep per night - Manage stress (meditation, breathing exercises, counseling if needed) - Limit alcohol (empty calories and may increase hunger) ???? Exercise Recommendations - Goal: 150 minutes per week of moderate activity (e.g., brisk walking, cycling) - Start with 10-15 minutes/day and build up gradually - Add 2 days per week of strength training (light weights, resistance bands, or bodyweight) ?? Remember: Progress > Perfection Small changes every day add up. Don't give up! - Avoid high-fat or greasy foods to reduce nausea - Focus on protein at each meal to preserve muscle mass - Stay well hydrated (at least 64 oz water  per day) - Limit sugar and processed carbohydrates ?? Managing Side Effects if on weight loss medication - Eat slowly and stop eating when you feel full - Use anti-nausea strategies: ginger tea, peppermint, crackers - Talk to your provider about adjusting the dose if needed - Stool softeners or fiber supplements can help with constipation ?? Staying on Track - Track weight and non-scale victories (energy, clothing fit, labs) - Follow up with your provider regularly - Don't stop medication without medical guidance - Combine medication with healthy habits for best results ??  Remember Weight loss medications are a tool, not a shortcut. Healthy habits matter. Be patient and consistent--small changes lead to big results.

## 2024-01-11 ENCOUNTER — Encounter: Payer: Self-pay | Admitting: Nurse Practitioner

## 2024-01-15 LAB — GENECONNECT MOLECULAR SCREEN: Genetic Analysis Overall Interpretation: NEGATIVE

## 2024-01-18 ENCOUNTER — Ambulatory Visit (INDEPENDENT_AMBULATORY_CARE_PROVIDER_SITE_OTHER): Admitting: Nurse Practitioner

## 2024-01-18 ENCOUNTER — Encounter: Payer: Self-pay | Admitting: Nurse Practitioner

## 2024-01-18 ENCOUNTER — Encounter: Payer: Self-pay | Admitting: Cardiology

## 2024-01-18 VITALS — BP 120/82 | HR 82 | Temp 97.8°F | Resp 18 | Ht 60.0 in | Wt 161.6 lb

## 2024-01-18 DIAGNOSIS — L308 Other specified dermatitis: Secondary | ICD-10-CM

## 2024-01-18 DIAGNOSIS — N959 Unspecified menopausal and perimenopausal disorder: Secondary | ICD-10-CM | POA: Insufficient documentation

## 2024-01-18 MED ORDER — HYDROXYZINE PAMOATE 25 MG PO CAPS: 25.0000 mg | ORAL_CAPSULE | Freq: Every evening | ORAL | 0 refills | Status: DC | PRN

## 2024-01-18 MED ORDER — CETIRIZINE HCL 10 MG PO TABS: 10.0000 mg | ORAL_TABLET | Freq: Every day | ORAL | 0 refills | Status: DC

## 2024-01-18 MED ORDER — FAMOTIDINE 20 MG PO TABS: 20.0000 mg | ORAL_TABLET | Freq: Two times a day (BID) | ORAL | 0 refills | Status: DC

## 2024-01-18 NOTE — Progress Notes (Signed)
 BP 120/82   Pulse 82   Temp 97.8 F (36.6 C)   Resp 18   Ht 5' (1.524 m)   Wt 161 lb 9.6 oz (73.3 kg)   SpO2 93%   BMI 31.56 kg/m    Subjective:    Patient ID: Shelby Colon, female    DOB: April 06, 1966, 58 y.o.   MRN: [id number]  HPI: Shelby Colon is a 58 y.o. female  Chief Complaint  Patient presents with   Pruritis    Just on neck, no rash but feels dry and weird    Discussed the use of AI scribe software for clinical note transcription with the patient, who gave verbal consent to proceed.  History of Present Illness Shelby Colon is a 58 year old female who presents with persistent itching on her neck and face.  Pruritus and xerosis - Persistent pruritus primarily affecting the neck and face  - Onset after use of a new skincare product received as a birthday present - No visible rash observed - Pruritus persists despite discontinuation of the skincare product four days ago - Symptoms worsen at night, during stress, and with outdoor humidity - Hydrocortisone cream, high-moisture moisturizer, and Pond's cold cream have not provided relief - Husband observed altered skin texture, prompting trial of different moisturizers - Associated with very dry skin  Menopausal symptoms - Menopause onset at age 48 with no menses since - Concerns regarding hormone levels - Experiencing irritability, joint pain, decreased libido, and weight gain  Weight changes - History of gastric bypass surgery at age 81 with significant weight loss postoperatively - Recent weight gain         01/04/2024    9:09 AM 11/10/2023    1:23 PM 11/08/2023   11:47 AM  Depression screen PHQ 2/9  Decreased Interest 1 2 1   Down, Depressed, Hopeless 1 3 3   PHQ - 2 Score 2 5 4   Altered sleeping 1 2 3   Tired, decreased energy 1 3 2   Change in appetite 1 3 1   Feeling bad or failure about yourself  1 3 3   Trouble concentrating 1 0 0  Moving slowly or fidgety/restless 0 2 0  Suicidal thoughts 0 0 0   PHQ-9 Score 7 18 13   Difficult doing work/chores Somewhat difficult Very difficult Extremely dIfficult    Relevant past medical, surgical, family and social history reviewed and updated as indicated. Interim medical history since our last visit reviewed. Allergies and medications reviewed and updated.  Review of Systems Ten systems reviewed and is negative except as mentioned in HPI      Objective:     BP 120/82   Pulse 82   Temp 97.8 F (36.6 C)   Resp 18   Ht 5' (1.524 m)   Wt 161 lb 9.6 oz (73.3 kg)   SpO2 93%   BMI 31.56 kg/m    Wt Readings from Last 3 Encounters:  01/18/24 161 lb 9.6 oz (73.3 kg)  01/04/24 159 lb 3.2 oz (72.2 kg)  12/15/23 163 lb 9.6 oz (74.2 kg)    Physical Exam Physical Exam GENERAL: Alert, cooperative, well developed, no acute distress HEENT: Normocephalic, normal oropharynx, moist mucous membranes CHEST: Clear to auscultation bilaterally, No wheezes, rhonchi, or crackles CARDIOVASCULAR: Normal heart rate and rhythm, S1 and S2 normal without murmurs ABDOMEN: Soft, non-tender, non-distended, without organomegaly, Normal bowel sounds EXTREMITIES: No cyanosis or edema NEUROLOGICAL: Cranial nerves grossly intact, Moves all extremities without gross motor or sensory deficit  Results for orders placed or performed during the hospital encounter of 01/02/24  [facility] Molecular Screen - Blood ([facility])   Collection Time: 01/02/24 11:09 AM  Result Value Ref Range   Genetic Analysis Overall Interpretation Negative    Genetic Disease Assessed      This is a screening test and does not detect all pathogenic or likely pathogenic variant(s) in the tested genes; diagnostic testing is recommended for individuals with a personal or family history of heart disease or hereditary cancer. [employer] [facility] One  Population Screen is a screening test that analyzes 11 genes related to hereditary breast and ovarian cancer (HBOC) syndrome, Lynch syndrome,  and familial hypercholesterolemia. This test only reports clinically significant pathogenic and likely  pathogenic variants but does not report variants of uncertain significance (VUS). In addition, analysis of the PMS2 gene excludes exons 11-15, which overlap with a known pseudogene (PMS2CL).    Genetic Analysis Report      No pathogenic or likely pathogenic variants were detected in the genes analyzed by this test.Genetic test results should be interpreted in the context of an individual's personal medical and family history. Alteration to medical management is NOT  recommended based solely on this result. Clinical correlation is advised.Additional Considerations- This is a screening test; individuals may still carry pathogenic or likely pathogenic variant(s) in the tested genes that are not detected by this test.-  For individuals at risk for these or other related conditions based on factors including personal or family history, diagnostic testing is recommended.- The absence of pathogenic or likely pathogenic variant(s) in the analyzed genes, while reassuring,  does not eliminate the possibility of a hereditary condition; there are other variants and genes associated with heart disease and hereditary cancer that are not included in this test.    Genes Tested See Notes    Disclaimer See Notes    Sequencing Location See Notes    Interpretation Methods and Limitations See Notes           Assessment & Plan:   Problem List Items Addressed This Visit   None Visit Diagnoses       Pruritic dermatitis    -  Primary   Relevant Medications   hydrOXYzine  (VISTARIL ) 25 MG capsule   famotidine  (PEPCID ) 20 MG tablet   cetirizine  (ZYRTEC ) 10 MG tablet        Assessment and Plan Assessment & Plan Atrial Fibrillation Atrial fibrillation with episodes triggered by bending down, managed with Eliquis . Ablation procedure is planned. Coordination with cardiologist is necessary to evaluate the  safety of hormone replacement therapy (HRT) in the context of atrial fibrillation management. - Continue Eliquis . - Consult cardiologist regarding HRT and its implications for atrial fibrillation management.  Menopausal Symptoms Symptoms include xerosis, xerostomia, irritability, arthralgia, and decreased libido. Menopause began at age 89 with complete cessation of menstruation. HRT is considered but requires cardiologist approval due to atrial fibrillation and Eliquis  use. Alternative treatment with Veozah is discussed if HRT is unsuitable. HRT may improve joint, bone, and cardiovascular health. Veozah requires routine liver enzyme monitoring due to potential hepatotoxicity. - Consult cardiologist regarding HRT safety due to atrial fibrillation and Eliquis  use. - Consider HRT with estrogen patches and progesterone pills if approved by cardiologist. - If HRT is not approved, consider Veozah with routine liver enzyme monitoring. - Wait until after ablation procedure to start new treatments.  Pruritus Intermittent pruritus localized to the neck, exacerbated by stress and humidity, with no visible rash. Likely  due to irritation and histamine release from a topical product. The condition worsens at night and in humid conditions. Treatment will focus on managing histamine release and irritation. - Recommend Zyrtec  or Claritin in the morning. - Prescribe hydroxyzine  at bedtime for sedative and antihistamine effects. - Prescribe Pepcid  twice daily to address histamine type 2 receptors.  General Health Maintenance Discussion of weight management and lifestyle modifications. History of gastric bypass and current efforts on weight loss through dietary changes. Menopausal symptoms may be contributing to weight gain and other health issues. - Encourage continued dietary management and weight loss efforts.        Follow up plan: Return if symptoms worsen or fail to improve.

## 2024-01-19 ENCOUNTER — Other Ambulatory Visit: Payer: Self-pay

## 2024-01-19 ENCOUNTER — Telehealth: Payer: Self-pay

## 2024-01-19 DIAGNOSIS — I480 Paroxysmal atrial fibrillation: Secondary | ICD-10-CM

## 2024-01-19 NOTE — Telephone Encounter (Signed)
 Called pt and went over detailed Instructions for CT/Ablation.   CT is scheduled on 8/25 at 11:00 am at [facility].SABRA She will get labs done at [facility] ([facility]) 8/21 Ablation is scheduled on 9/15 at 7:30 am with Dr. Cindie  Pt is aware that I will be sending her 2 Instruction letters via MyChart - she will review and let me know if she has any questions or concerns.  Pt confirmed that she has started taking Eliquis  BID and  just picked up a refill - she was instructed to be very careful to not miss a dose and if she dose to notify our office.

## 2024-01-19 NOTE — Telephone Encounter (Signed)
 Pt calling stating she went to [facility] but there are no orders in hers chart.

## 2024-01-19 NOTE — Telephone Encounter (Signed)
 I called pt and explained to her what had happened was she went to [facility] in July and they drew the labs we had put in for her procedure at that time.   I reordered her procedure labs and she will go back tomorrow.  I apologized for the inconvenience.

## 2024-01-21 LAB — CBC
Hematocrit: 40.5 % (ref 34.0–46.6)
Hemoglobin: 12.2 g/dL (ref 11.1–15.9)
MCH: 27 pg (ref 26.6–33.0)
MCHC: 30.1 g/dL — ABNORMAL LOW (ref 31.5–35.7)
MCV: 90 fL (ref 79–97)
Platelets: 388 x10E3/uL (ref 150–450)
RBC: 4.52 x10E6/uL (ref 3.77–5.28)
RDW: 12.4 % (ref 11.7–15.4)
WBC: 5.8 x10E3/uL (ref 3.4–10.8)

## 2024-01-21 LAB — BASIC METABOLIC PANEL WITH GFR
BUN/Creatinine Ratio: 17 (ref 9–23)
BUN: 14 mg/dL (ref 6–24)
CO2: 20 mmol/L (ref 20–29)
Calcium: 9.4 mg/dL (ref 8.7–10.2)
Chloride: 106 mmol/L (ref 96–106)
Creatinine, Ser: 0.84 mg/dL (ref 0.57–1.00)
Glucose: 74 mg/dL (ref 70–99)
Potassium: 4.5 mmol/L (ref 3.5–5.2)
Sodium: 142 mmol/L (ref 134–144)
eGFR: 81 mL/min/1.73 (ref >59)

## 2024-01-23 ENCOUNTER — Ambulatory Visit
Admission: RE | Admit: 2024-01-23 | Discharge: 2024-01-23 | Disposition: A | Discharge status: Home / Self Care | Source: Ambulatory Visit | Attending: Cardiology | Admitting: Cardiology

## 2024-01-23 DIAGNOSIS — I480 Paroxysmal atrial fibrillation: Secondary | ICD-10-CM | POA: Diagnosis present

## 2024-01-23 MED ORDER — SODIUM CHLORIDE 0.9 % IV SOLN: INTRAVENOUS | Status: DC

## 2024-01-23 MED ORDER — IOHEXOL 350 MG/ML SOLN
75.0000 mL | Freq: Once | INTRAVENOUS | Status: AC | PRN
  Administered 2024-01-23: 75 mL via INTRAVENOUS

## 2024-01-25 ENCOUNTER — Telehealth: Payer: Self-pay

## 2024-01-25 NOTE — Telephone Encounter (Signed)
 Called pt and moved her AF Ablation from 9/15 to 9/2 at 7:30. She is aware of her arrival time of 530 am and where to go.   I will send updated Instruction letter via MyChart.

## 2024-01-27 NOTE — Pre-Procedure Instructions (Signed)
 Instructed patient on the following items: Arrival time 0515 Nothing to eat or drink after midnight No meds AM of procedure Responsible person to drive you home and stay with you for 24 hrs  Have you missed any doses of anti-coagulant Eliquis- takes twice a day, hasn't missed any doses.  Don't take dose morning of procedure.

## 2024-01-30 HISTORY — PX: CARDIAC ELECTROPHYSIOLOGY STUDY AND ABLATION: SHX1294

## 2024-01-31 ENCOUNTER — Other Ambulatory Visit: Payer: Self-pay

## 2024-01-31 ENCOUNTER — Ambulatory Visit (HOSPITAL_COMMUNITY)

## 2024-01-31 ENCOUNTER — Encounter (HOSPITAL_COMMUNITY)
Admission: RE | Disposition: A | Discharge status: Home / Self Care | Payer: Self-pay | Source: Home / Self Care | Attending: Cardiology

## 2024-01-31 ENCOUNTER — Encounter (HOSPITAL_COMMUNITY): Payer: Self-pay | Admitting: Cardiology

## 2024-01-31 ENCOUNTER — Other Ambulatory Visit (HOSPITAL_COMMUNITY): Payer: Self-pay

## 2024-01-31 ENCOUNTER — Ambulatory Visit (HOSPITAL_COMMUNITY)
Admission: RE | Admit: 2024-01-31 | Discharge: 2024-01-31 | Disposition: A | Discharge status: Home / Self Care | Attending: Cardiology | Admitting: Cardiology

## 2024-01-31 DIAGNOSIS — Z7982 Long term (current) use of aspirin: Secondary | ICD-10-CM | POA: Insufficient documentation

## 2024-01-31 DIAGNOSIS — I480 Paroxysmal atrial fibrillation: Secondary | ICD-10-CM

## 2024-01-31 DIAGNOSIS — I341 Nonrheumatic mitral (valve) prolapse: Secondary | ICD-10-CM | POA: Diagnosis not present

## 2024-01-31 DIAGNOSIS — G8929 Other chronic pain: Secondary | ICD-10-CM | POA: Insufficient documentation

## 2024-01-31 DIAGNOSIS — I10 Essential (primary) hypertension: Secondary | ICD-10-CM

## 2024-01-31 DIAGNOSIS — I4891 Unspecified atrial fibrillation: Secondary | ICD-10-CM

## 2024-01-31 DIAGNOSIS — F418 Other specified anxiety disorders: Secondary | ICD-10-CM

## 2024-01-31 DIAGNOSIS — M549 Dorsalgia, unspecified: Secondary | ICD-10-CM | POA: Insufficient documentation

## 2024-01-31 DIAGNOSIS — Z79899 Other long term (current) drug therapy: Secondary | ICD-10-CM | POA: Diagnosis not present

## 2024-01-31 HISTORY — PX: ATRIAL FIBRILLATION ABLATION: EP1191

## 2024-01-31 LAB — POCT ACTIVATED CLOTTING TIME: Activated Clotting Time: 291 s

## 2024-01-31 SURGERY — ATRIAL FIBRILLATION ABLATION
Anesthesia: General

## 2024-01-31 MED ORDER — PROPOFOL 1000 MG/100ML IV EMUL
INTRAVENOUS | Status: AC
  Filled 2024-01-31: qty 100

## 2024-01-31 MED ORDER — ONDANSETRON HCL 4 MG/2ML IJ SOLN
INTRAMUSCULAR | Status: DC | PRN
  Administered 2024-01-31: 4 mg via INTRAVENOUS

## 2024-01-31 MED ORDER — COLCHICINE 0.6 MG PO TABS
0.6000 mg | ORAL_TABLET | Freq: Two times a day (BID) | ORAL | 0 refills | Status: DC
  Filled 2024-01-31: qty 5, 3d supply, fill #0

## 2024-01-31 MED ORDER — PANTOPRAZOLE SODIUM 40 MG PO TBEC
40.0000 mg | DELAYED_RELEASE_TABLET | Freq: Every day | ORAL | 0 refills | Status: DC
  Filled 2024-01-31: qty 45, 45d supply, fill #0

## 2024-01-31 MED ORDER — ACETAMINOPHEN 325 MG PO TABS
650.0000 mg | ORAL_TABLET | ORAL | Status: DC | PRN
  Administered 2024-01-31: 650 mg via ORAL

## 2024-01-31 MED ORDER — SUGAMMADEX SODIUM 200 MG/2ML IV SOLN
INTRAVENOUS | Status: DC | PRN
  Administered 2024-01-31: 200 mg via INTRAVENOUS

## 2024-01-31 MED ORDER — LIDOCAINE 2% (20 MG/ML) 5 ML SYRINGE
INTRAMUSCULAR | Status: DC | PRN
  Administered 2024-01-31: 40 mg via INTRAVENOUS

## 2024-01-31 MED ORDER — FENTANYL CITRATE (PF) 250 MCG/5ML IJ SOLN
INTRAMUSCULAR | Status: DC | PRN
  Administered 2024-01-31: 25 ug via INTRAVENOUS
  Administered 2024-01-31: 50 ug via INTRAVENOUS
  Administered 2024-01-31: 25 ug via INTRAVENOUS

## 2024-01-31 MED ORDER — PROPOFOL 500 MG/50ML IV EMUL
INTRAVENOUS | Status: DC | PRN
  Administered 2024-01-31: 40 ug/kg/min via INTRAVENOUS

## 2024-01-31 MED ORDER — ACETAMINOPHEN 325 MG PO TABS
ORAL_TABLET | ORAL | Status: AC
  Filled 2024-01-31: qty 2

## 2024-01-31 MED ORDER — FENTANYL CITRATE (PF) 100 MCG/2ML IJ SOLN
INTRAMUSCULAR | Status: AC
Start: 2024-01-31 — End: 2024-01-31
  Filled 2024-01-31: qty 2

## 2024-01-31 MED ORDER — PHENYLEPHRINE 80 MCG/ML (10ML) SYRINGE FOR IV PUSH (FOR BLOOD PRESSURE SUPPORT)
PREFILLED_SYRINGE | INTRAVENOUS | Status: DC | PRN
  Administered 2024-01-31 (×3): 80 ug via INTRAVENOUS

## 2024-01-31 MED ORDER — ONDANSETRON HCL 4 MG/2ML IJ SOLN: 4.0000 mg | Freq: Four times a day (QID) | INTRAMUSCULAR | Status: DC | PRN

## 2024-01-31 MED ORDER — MIDAZOLAM HCL 2 MG/2ML IJ SOLN
INTRAMUSCULAR | Status: AC
  Filled 2024-01-31: qty 2

## 2024-01-31 MED ORDER — PROPOFOL 10 MG/ML IV BOLUS
INTRAVENOUS | Status: DC | PRN
  Administered 2024-01-31: 100 mg via INTRAVENOUS

## 2024-01-31 MED ORDER — COLCHICINE 0.6 MG PO TABS
0.6000 mg | ORAL_TABLET | Freq: Two times a day (BID) | ORAL | Status: DC
  Administered 2024-01-31: 0.6 mg via ORAL
  Filled 2024-01-31: qty 1

## 2024-01-31 MED ORDER — HEPARIN SODIUM (PORCINE) 1000 UNIT/ML IJ SOLN
INTRAMUSCULAR | Status: AC
  Filled 2024-01-31: qty 20

## 2024-01-31 MED ORDER — DEXAMETHASONE SODIUM PHOSPHATE 10 MG/ML IJ SOLN
INTRAMUSCULAR | Status: DC | PRN
  Administered 2024-01-31: 10 mg via INTRAVENOUS

## 2024-01-31 MED ORDER — APIXABAN 5 MG PO TABS
5.0000 mg | ORAL_TABLET | Freq: Two times a day (BID) | ORAL | Status: DC
  Administered 2024-01-31: 5 mg via ORAL
  Filled 2024-01-31: qty 1

## 2024-01-31 MED ORDER — ROCURONIUM BROMIDE 10 MG/ML (PF) SYRINGE
PREFILLED_SYRINGE | INTRAVENOUS | Status: DC | PRN
  Administered 2024-01-31 (×2): 20 mg via INTRAVENOUS
  Administered 2024-01-31: 40 mg via INTRAVENOUS
  Administered 2024-01-31: 10 mg via INTRAVENOUS

## 2024-01-31 MED ORDER — HEPARIN SODIUM (PORCINE) 1000 UNIT/ML IJ SOLN
INTRAMUSCULAR | Status: DC | PRN
  Administered 2024-01-31: 3000 [IU] via INTRAVENOUS
  Administered 2024-01-31: 10000 [IU] via INTRAVENOUS

## 2024-01-31 MED ORDER — HEPARIN SODIUM (PORCINE) 1000 UNIT/ML IJ SOLN
INTRAMUSCULAR | Status: AC
Start: 2024-01-31 — End: 2024-01-31
  Filled 2024-01-31: qty 10

## 2024-01-31 MED ORDER — ATROPINE SULFATE 1 MG/10ML IJ SOSY
PREFILLED_SYRINGE | INTRAMUSCULAR | Status: DC | PRN
  Administered 2024-01-31: 1 mg via INTRAVENOUS

## 2024-01-31 MED ORDER — SODIUM CHLORIDE 0.9 % IV SOLN: INTRAVENOUS | Status: DC

## 2024-01-31 MED ORDER — PANTOPRAZOLE SODIUM 40 MG PO TBEC
40.0000 mg | DELAYED_RELEASE_TABLET | Freq: Every day | ORAL | Status: DC
  Administered 2024-01-31: 40 mg via ORAL
  Filled 2024-01-31: qty 1

## 2024-01-31 MED ORDER — PROTAMINE SULFATE 10 MG/ML IV SOLN
INTRAVENOUS | Status: DC | PRN
  Administered 2024-01-31: 30 mg via INTRAVENOUS

## 2024-01-31 MED ORDER — SODIUM CHLORIDE 0.9 % IV SOLN: 250.0000 mL | INTRAVENOUS | Status: DC | PRN

## 2024-01-31 MED ORDER — PREGABALIN 75 MG PO CAPS
75.0000 mg | ORAL_CAPSULE | Freq: Once | ORAL | Status: AC
  Administered 2024-01-31: 75 mg via ORAL
  Filled 2024-01-31: qty 1

## 2024-01-31 MED ORDER — HEPARIN (PORCINE) IN NACL 1000-0.9 UT/500ML-% IV SOLN
INTRAVENOUS | Status: DC | PRN
  Administered 2024-01-31 (×3): 500 mL

## 2024-01-31 MED ORDER — SODIUM CHLORIDE 0.9% FLUSH: 3.0000 mL | Freq: Two times a day (BID) | INTRAVENOUS | Status: DC

## 2024-01-31 MED ORDER — PHENYLEPHRINE HCL-NACL 20-0.9 MG/250ML-% IV SOLN
INTRAVENOUS | Status: DC | PRN
  Administered 2024-01-31: 50 ug/min via INTRAVENOUS

## 2024-01-31 MED ORDER — MIDAZOLAM HCL 2 MG/2ML IJ SOLN
INTRAMUSCULAR | Status: DC | PRN
Start: 2024-01-31 — End: 2024-01-31
  Administered 2024-01-31: 2 mg via INTRAVENOUS

## 2024-01-31 MED ORDER — SODIUM CHLORIDE 0.9% FLUSH: 3.0000 mL | INTRAVENOUS | Status: DC | PRN

## 2024-01-31 SURGICAL SUPPLY — 20 items
BAG SNAP BAND KOVER 36X36 (MISCELLANEOUS) IMPLANT
BLANKET WARM UNDERBOD FULL ACC (MISCELLANEOUS) ×1 IMPLANT
CABLE FARASTAR GEN2 SNGL USE (CABLE) IMPLANT
CATH FARAWAVE 2.0 31 (CATHETERS) IMPLANT
CATH GE 8FR SOUNDSTAR (CATHETERS) IMPLANT
CATH OCTARAY 2.0 F 3-3-3-3-3 (CATHETERS) IMPLANT
CATH WEBSTER BI DIR CS D-F CRV (CATHETERS) IMPLANT
CLOSURE PERCLOSE PROSTYLE (VASCULAR PRODUCTS) IMPLANT
COVER SWIFTLINK CONNECTOR (BAG) ×1 IMPLANT
DILATOR VESSEL 38 20CM 16FR (INTRODUCER) IMPLANT
GUIDEWIRE INQWIRE 1.5J.035X260 (WIRE) IMPLANT
KIT VERSACROSS CNCT FARADRIVE (KITS) IMPLANT
PACK EP LF (CUSTOM PROCEDURE TRAY) ×1 IMPLANT
PAD DEFIB RADIO PHYSIO CONN (PAD) ×1 IMPLANT
PATCH CARTO3 (PAD) IMPLANT
SHEATH FARADRIVE STEERABLE (SHEATH) IMPLANT
SHEATH PINNACLE 5F 10CM (SHEATH) IMPLANT
SHEATH PINNACLE 8F 10CM (SHEATH) IMPLANT
SHEATH PINNACLE VASC 9FR (SHEATH) IMPLANT
SHEATH PROBE COVER 6X72 (BAG) IMPLANT

## 2024-01-31 NOTE — Anesthesia Postprocedure Evaluation (Signed)
 Anesthesia Post Note  Patient: Melah Ebling  Procedure(s) Performed: ATRIAL FIBRILLATION ABLATION     Patient location during evaluation: PACU Anesthesia Type: General Level of consciousness: awake and alert, oriented and patient cooperative Pain management: pain level controlled Vital Signs Assessment: post-procedure vital signs reviewed and stable Respiratory status: spontaneous breathing, nonlabored ventilation and respiratory function stable Cardiovascular status: blood pressure returned to baseline and stable Postop Assessment: no apparent nausea or vomiting Anesthetic complications: no   No notable events documented.  Last Vitals:  Vitals:   01/31/24 0925 01/31/24 0930  BP: (!) 89/43 (!) 96/58  Pulse: 80 82  Resp: 14 18  Temp:    SpO2: 97% 99%    Last Pain:  Vitals:   01/31/24 0946  TempSrc:   PainSc: 8                  Almarie CHRISTELLA Marchi

## 2024-01-31 NOTE — Progress Notes (Signed)
 Called to bedside to review for bilateral groin and low back pain.  Patient just finished lunch. Husband at bedside. In no acute distress.  Reports bilateral groin pain that extends into her back. She states she has a hx of back issues with rods in place.  She takes flexeril  and lyrica  at home.  RN reports pt had tylenol  650 mg around 945 am.    Groin sites assessed and soft bilaterally with no ecchymosis or hematoma.    Plan -add single dose of lyrica  75mg  x1 (home med) -bed rest up in 50 minutes -reverse trendelenburg positioning   Daphne Barrack, NP-C, AGACNP-BC [facility] - Electrophysiology  01/31/2024, 11:23 AM

## 2024-01-31 NOTE — Discharge Instructions (Signed)

## 2024-01-31 NOTE — Anesthesia Preprocedure Evaluation (Addendum)
 Anesthesia Evaluation  Patient identified by MRN, date of birth, ID band Patient awake    Reviewed: Allergy  & Precautions, H&P , NPO status , Patient's Chart, lab work & pertinent test results, reviewed documented beta blocker date and time   Airway Mallampati: II  TM Distance: >3 FB Neck ROM: Full    Dental  (+) Partial Lower   Pulmonary neg pulmonary ROS   Pulmonary exam normal breath sounds clear to auscultation       Cardiovascular hypertension (117/66 preop), Pt. on medications and Pt. on home beta blockers + dysrhythmias Atrial Fibrillation and Supra Ventricular Tachycardia + Valvular Problems/Murmurs (mild MR on echo 2025) MR  Rhythm:Irregular Rate:Normal  Echo 10/2023  1. Left ventricular ejection fraction, by estimation, is 55 to 60%. The  left ventricle has normal function. The left ventricle has no regional  wall motion abnormalities. Left ventricular diastolic parameters were  normal. The average left ventricular  global longitudinal strain is -17.9 %. The global longitudinal strain is  normal.   2. Right ventricular systolic function is normal. The right ventricular  size is normal.   3. Left atrial size was mild to moderately dilated.   4. The mitral valve is normal in structure. Mild mitral valve  regurgitation.   5. The aortic valve is tricuspid. Aortic valve regurgitation is not  visualized. Aortic valve sclerosis is present, with no evidence of aortic  valve stenosis.   6. The inferior vena cava is normal in size with greater than 50%  respiratory variability, suggesting right atrial pressure of 3 mmHg.     Neuro/Psych  Headaches PSYCHIATRIC DISORDERS Anxiety Depression       GI/Hepatic Neg liver ROS,,,S/p gastric bypass 2013   Endo/Other  negative endocrine ROS    Renal/GU Renal disease (solitary kidney)  negative genitourinary   Musculoskeletal  (+) Arthritis , Osteoarthritis,    Abdominal    Peds negative pediatric ROS (+)  Hematology negative hematology ROS (+)   Anesthesia Other Findings   Reproductive/Obstetrics negative OB ROS                              Anesthesia Physical Anesthesia Plan  ASA: 3  Anesthesia Plan: General   Post-op Pain Management:    Induction: Intravenous  PONV Risk Score and Plan: 3 and Ondansetron , Dexamethasone , Midazolam  and Treatment may vary due to age or medical condition  Airway Management Planned: Oral ETT  Additional Equipment: None  Intra-op Plan:   Post-operative Plan: Extubation in OR  Informed Consent: I have reviewed the patients History and Physical, chart, labs and discussed the procedure including the risks, benefits and alternatives for the proposed anesthesia with the patient or authorized representative who has indicated his/her understanding and acceptance.     Dental advisory given  Plan Discussed with: CRNA  Anesthesia Plan Comments:          Anesthesia Quick Evaluation

## 2024-01-31 NOTE — Transfer of Care (Signed)
 Immediate Anesthesia Transfer of Care Note  Patient: Shelby Colon  Procedure(s) Performed: ATRIAL FIBRILLATION ABLATION  Patient Location: PACU and Cath Lab  Anesthesia Type:General  Level of Consciousness: awake, alert , and oriented  Airway & Oxygen Therapy: Patient Spontanous Breathing and Patient connected to nasal cannula oxygen  Post-op Assessment: Report given to RN and Post -op Vital signs reviewed and stable  Post vital signs: stable  Last Vitals:  Vitals Value Taken Time  BP 91/50 01/31/24 09:21  Temp 36.6 C 01/31/24 09:23  Pulse 81 01/31/24 09:23  Resp 33 01/31/24 09:23  SpO2 98 % 01/31/24 09:23  Vitals shown include unfiled device data.  Last Pain:  Vitals:   01/31/24 0923  TempSrc: Oral  PainSc:       Patients Stated Pain Goal: 3 (01/31/24 0543)  Complications: No notable events documented.

## 2024-01-31 NOTE — H&P (Signed)
 Electrophysiology Office Note:     Date:  01/31/2024    ID:  Shelby Colon, DOB 1965-08-28, MRN [id number]   [facility] Cardiologist:  Redell Cave, MD  [facility] Electrophysiologist:  OLE ONEIDA HOLTS, MD    Referring MD: Cave Redell, MD    Chief Complaint: Paroxysmal atrial fibrillation   History of Present Illness:     [name] is a 58 year old woman who I am seeing today for an evaluation of atrial fibrillation at the request of Dr. Cave.  The patient has a history of anxiety, chronic back pain and paroxysmal atrial fibrillation.  She was seen in the hospital on a recent trip to [place]  with symptomatic atrial fibrillation.  There her heart rate was in the 160s.  She spontaneously converted back to sinus rhythm.  She was sent home on Eliquis .  She never started the Eliquis  and now takes aspirin 81 mg by mouth once daily.   At the most recent appointment with Dr. Cave on October 31, 2023, her metoprolol  succinate was increased.  An echo was ordered given concern for bicuspid aortic valve.   She is with her husband today in clinic.  In [place]  she described sudden onset of headache.  The headache persisted and then began to be associated with heart palpitations and a feeling like her heart was racing.  This persisted for hours.  She started to feel lightheaded and dizzy.  She almost passed out several times.  Even after she had to the hospital her symptoms persisted.  Ultimately she was found to be in atrial fibrillation.  She then converted back to sinus rhythm spontaneously.  She was discharged on metoprolol .  She is tolerating metoprolol  but feels fatigued.  She is concerned about drug interactions with her Lexapro  and Lyrica  with any antiarrhythmic drugs we may prescribe.   She was prescribed Eliquis  but did not fill the drug because of the high cost per month.  Presents for AF ablation. Procedure reviewed.  Objective Their past medical, social and family  history was reviewed.     ROS:   Please see the history of present illness.    All other systems reviewed and are negative.   EKGs/Labs/Other Studies Reviewed:     The following studies were reviewed today:   November 22, 2023 ZIO monitor Less than 1% burden of atrial fibrillation, average heart rate of 142 bpm, symptomatic   November 15, 2023 echo EF 55-60 RV normal Mild to moderately dilated left atrium Mild MR Aortic sclerosis, no stenosis   [facility] records           Physical Exam:     VS:  BP 117/66   Pulse 64   Ht 5' 2 (1.575 m)   Wt 163 lb (73.9 kg)   SpO2 95%   BMI 29.81 kg/m         Wt Readings from Last 3 Encounters:  11/23/23 163 lb (73.9 kg)  11/10/23 161 lb 6 oz (73.2 kg)  11/08/23 158 lb (71.7 kg)      GEN: no distress CARD: RRR, No MRG RESP: No IWOB. CTAB.         Assessment ASSESSMENT AND PLAN:     1. Paroxysmal atrial fibrillation (HCC)       #Paroxysmal atrial fibrillation/atrial tachycardia Symptomatic Low burden but rapidly conducted when she does have atrial fibrillation On aspirin 81 mg by mouth once daily for stroke prophylaxis   I discussed treatment options today in detail  including conservative management, antiarrhythmic drugs and catheter ablation.  She is a good candidate for catheter ablation for upfront therapy.  She would like to proceed with this strategy.   Discussed treatment options today for AF including antiarrhythmic drug therapy and ablation. Discussed risks, recovery and likelihood of success with each treatment strategy. Risk, benefits, and alternatives to EP study and ablation for afib were discussed. These risks include but are not limited to stroke, bleeding, vascular damage, tamponade, perforation, damage to the esophagus, lungs, phrenic nerve and other structures, pulmonary vein stenosis, worsening renal function, coronary vasospasm and death.  Discussed potential need for repeat ablation procedures  and antiarrhythmic drugs after an initial ablation. The patient understands these risk and wishes to proceed.  We will therefore proceed with catheter ablation at the next available time.  Carto, ICE, anesthesia are requested for the procedure.  Will also obtain CT PV protocol prior to the procedure to exclude LAA thrombus and further evaluate atrial anatomy.   She will need to start an anticoagulant 4 weeks prior to the procedure and plan to continue this for 3 months following the procedure.  We will have our pharmacist weigh in about the most affordable option.  It may be Coumadin.  I discussed this with the patient and she is agreeable.   CHA2DS2-VASc Score = 1  The patient's score is based upon: CHF History: 0 HTN History: 0 Diabetes History: 0 Stroke History: 0 Vascular Disease History: 0 Age Score: 0 Gender Score: 1    Presents for AF ablation. Procedure reviewed.     Signed, Ole DASEN. Cindie, MD, [facility], [facility] 01/31/2024 Electrophysiology [facility]

## 2024-01-31 NOTE — Anesthesia Procedure Notes (Signed)
 Procedure Name: Intubation Date/Time: 01/31/2024 7:41 AM  Performed by: Moishe Reyes CROME, CRNAPre-anesthesia Checklist: Patient identified, Emergency Drugs available, Suction available, Patient being monitored and Timeout performed Patient Re-evaluated:Patient Re-evaluated prior to induction Oxygen Delivery Method: Circle system utilized Preoxygenation: Pre-oxygenation with 100% oxygen Induction Type: IV induction Ventilation: Mask ventilation without difficulty Laryngoscope Size: McGrath and 3 Grade View: Grade I Tube size: 7.0 mm Number of attempts: 1 Airway Equipment and Method: Stylet and Video-laryngoscopy Placement Confirmation: ETT inserted through vocal cords under direct vision, positive ETCO2, CO2 detector and breath sounds checked- equal and bilateral Secured at: 20 cm Tube secured with: Tape Dental Injury: Teeth and Oropharynx as per pre-operative assessment

## 2024-02-01 ENCOUNTER — Telehealth (HOSPITAL_COMMUNITY): Payer: Self-pay

## 2024-02-01 NOTE — Telephone Encounter (Signed)
 Spoke with patient to complete post procedure follow up call.  Patient reports no complications with groin sites.   Instructions reviewed with patient:  Remove large bandage at puncture site after 24 hours. It is normal to have bruising, tenderness, mild swelling, and a pea or marble sized lump/knot at the groin site which can take up to three months to resolve.  Get help right away if you notice sudden swelling at the puncture site.  Check your puncture site every day for signs of infection: fever, redness, swelling, pus drainage, warmth, foul odor or excessive pain. If this occurs, please call [phone], to speak with the RN Navigator. Get help right away if your puncture site is bleeding and the bleeding does not stop after applying firm pressure to the area.  You may continue to have skipped beats/ atrial fibrillation during the first several months after your procedure.  It is very important not to miss any doses of your blood thinner Eliquis .    You will follow up with the APP on 02/28/24 and follow up with Felicitas Holts on 05/09/24.     Patient verbalized understanding to all instructions provided.

## 2024-02-13 ENCOUNTER — Other Ambulatory Visit: Payer: Self-pay | Admitting: Nurse Practitioner

## 2024-02-13 DIAGNOSIS — L308 Other specified dermatitis: Secondary | ICD-10-CM

## 2024-02-14 NOTE — Telephone Encounter (Signed)
 Requested medication (s) are due for refill today: yes   Requested medication (s) are on the active medication list: yes   Last refill:  01/18/24 #60 0 refills   Future visit scheduled: yes in 4 months  Notes to clinic:  no refills remain. Do you want to continue refills?     Requested Prescriptions  Pending Prescriptions Disp Refills   famotidine  (PEPCID ) 20 MG tablet [Pharmacy Med Name: FAMOTIDINE  20 MG TABLET] 60 tablet 0    Sig: TAKE 1 TABLET BY MOUTH 2 TIMES A DAY     Gastroenterology:  H2 Antagonists Passed - 02/14/2024  3:43 PM      Passed - Valid encounter within last 12 months    Recent Outpatient Visits           3 weeks ago Pruritic dermatitis   [facility] Gareth Mliss FALCON, FNP   1 month ago Primary hypertension   [facility] Gareth Mliss FALCON, FNP       Future Appointments             In 2 weeks Riddle, Suzann, NP [facility] [place]   In 2 months Cindie Ole DASEN, MD [facility] [place]   In 4 months Gareth, Mliss FALCON, FNP [facility] [place], [place]

## 2024-02-15 ENCOUNTER — Other Ambulatory Visit: Payer: Self-pay | Admitting: Nurse Practitioner

## 2024-02-15 ENCOUNTER — Encounter: Payer: Self-pay | Admitting: Nurse Practitioner

## 2024-02-15 DIAGNOSIS — N959 Unspecified menopausal and perimenopausal disorder: Secondary | ICD-10-CM

## 2024-02-15 MED ORDER — PROGESTERONE MICRONIZED 100 MG PO CAPS: 100.0000 mg | ORAL_CAPSULE | Freq: Every day | ORAL | 1 refills | Status: AC

## 2024-02-15 MED ORDER — ESTRADIOL 0.025 MG/24HR TD PTTW: 1.0000 | MEDICATED_PATCH | TRANSDERMAL | 1 refills | Status: DC

## 2024-02-16 ENCOUNTER — Other Ambulatory Visit: Payer: Self-pay | Admitting: Cardiology

## 2024-02-27 NOTE — Progress Notes (Unsigned)
 Electrophysiology Clinic Note    Date:  02/28/2024  Patient ID:  Shelby Colon [birth year], MRN [id number] PCP:  Gareth Mliss FALCON, FNP  Cardiologist:  Redell Cave, MD  Electrophysiologist:  OLE ONEIDA HOLTS, MD  Electrophysiology APP:  Dailyn Kempner, NP Maddie Brazier, NP    Discussed the use of AI scribe software for clinical note transcription with the patient, who gave verbal consent to proceed.   Patient Profile    Chief Complaint: AFib follow-up  History of Present Illness: Shelby Colon is a 58 y.o. female with PMH notable for parox AFib; seen today for OLE ONEIDA HOLTS, MD for routine electrophysiology follow-up s/p AF Ablation.  She is s/p AF ablation w isolation of pulm veins on 01/31/2024 by Dr. HOLTS.  On follow-up today, she has not had any AF episodes since ablation. She continues to take eliquis  BID with some gum bleeding after brushing teeth, resolves quickly.  Her groin sites are completely healed, no tenderness, bruising, or swelling.  She denies chest pain, chest pressure, palpitations. She has noticed some daytime fatigue and adjusted metop to 25mg  BID instead of 50mg  daily with some improvement. She would like to stop metop when possible.   Her husband joins for appointment.      Arrhythmia/Device History No specialty comments available.    ROS:  Please see the history of present illness. All other systems are reviewed and otherwise negative.    Physical Exam    VS:  BP 122/86 (BP Location: Left Arm, Patient Position: Sitting)   Pulse 62   Ht 5' 2 (1.575 m)   Wt 156 lb (70.8 kg)   SpO2 97%   BMI 28.53 kg/m  BMI: Body mass index is 28.53 kg/m.           Wt Readings from Last 3 Encounters:  02/28/24 156 lb (70.8 kg)  01/31/24 153 lb (69.4 kg)  01/18/24 161 lb 9.6 oz (73.3 kg)     GEN- The patient is well appearing, alert and oriented x 3 today.   Lungs- Clear to ausculation bilaterally, normal work of breathing.   Heart- Regular rate and rhythm, no murmurs, rubs or gallops Extremities- No peripheral edema, warm, dry   Studies Reviewed   Previous EP, cardiology notes.    EKG is ordered. Personal review of EKG from today shows:    EKG Interpretation Date/Time:  Tuesday February 28 2024 14:40:57 EDT Ventricular Rate:  62 PR Interval:  142 QRS Duration:  88 QT Interval:  412 QTC Calculation: 418 R Axis:   39  Text Interpretation: Normal sinus rhythm Confirmed by Harinder Romas [phone]) on 02/28/2024 2:44:34 PM    Long term monitor, 11/21/2023 Patient had a min HR of 60 bpm, max HR of 222 bpm, and avg HR of 83 bpm. Predominant underlying rhythm was Sinus Rhythm. 239 Supraventricular Tachycardia runs occurred, the run with the fastest interval lasting 4 beats with a max rate of 222 bpm, the  longest lasting 18 beats with an avg rate of 141 bpm. Some episodes of Supraventricular Tachycardia may be possible Atrial Tachycardia with variable block. Atrial Fibrillation occurred (<1% burden), ranging from 108-178 bpm (avg of 142 bpm), the longest  lasting 56 secs with an avg rate of 140 bpm. Supraventricular Tachycardia and Atrial Fibrillation were detected within +/- 45 seconds of symptomatic patient event(s). Isolated SVEs were rare (<1.0%, 9257), SVE Couplets were rare (<1.0%, 288), and SVE  Triplets were rare (<1.0%, 209). Isolated VEs were rare (<  1.0%), VE Couplets were rare (<1.0%), and no VE Triplets were present.   Conclusion Average heart rate 83 bpm Paroxysmal atrial fibrillation less than 1% burden noted. Paroxysmal SVT noted.  TTE, 11/15/2023  1. Left ventricular ejection fraction, by estimation, is 55 to 60%. The left ventricle has normal function. The left ventricle has no regional wall motion abnormalities. Left ventricular diastolic parameters were normal. The average left ventricular global longitudinal strain is -17.9 %. The global longitudinal strain is normal.   2. Right ventricular  systolic function is normal. The right ventricular size is normal.   3. Left atrial size was mild to moderately dilated.   4. The mitral valve is normal in structure. Mild mitral valve regurgitation.   5. The aortic valve is tricuspid. Aortic valve regurgitation is not visualized. Aortic valve sclerosis is present, with no evidence of aortic valve stenosis.   6. The inferior vena cava is normal in size with greater than 50% respiratory variability, suggesting right atrial pressure of 3 mmHg.    Assessment and Plan     #) parox AFib S/p AF ablation 12/2023 by Dr. Cindie Maintaining sinus rhythm Groin sites healed Continue 25mg  toprol  BID, consider stopping at 3mon post-ablation appt   #) Hypercoag d/t afib CHA2DS2-VASc Score = at least 1 [CHF History: 0, HTN History: 0, Diabetes History: 0, Stroke History: 0, Vascular Disease History: 0, Age Score: 0, Gender Score: 1].  Therefore, the patient's annual risk of stroke is 0.6 %.    Stroke ppx - 5mg  eliquis  BID, appropriately dosed No bleeding concerns Will continue eliquis  for 3 months post-ablation then likely stopping      Current medicines are reviewed at length with the patient today.   The patient has concerns regarding her medicines.  The following changes were made today:  none  Labs/ tests ordered today include:  Orders Placed This Encounter  Procedures   EKG 12-Lead     Disposition: Follow up with Dr. Cindie or EP APP in 2 months    Signed, Nellie Chevalier, NP  02/28/24  3:09 PM  Electrophysiology [facility]

## 2024-02-28 ENCOUNTER — Ambulatory Visit (HOSPITAL_COMMUNITY): Admitting: Internal Medicine

## 2024-02-28 ENCOUNTER — Ambulatory Visit: Attending: Cardiology | Admitting: Cardiology

## 2024-02-28 DIAGNOSIS — M545 Low back pain, unspecified: Secondary | ICD-10-CM

## 2024-02-28 DIAGNOSIS — I480 Paroxysmal atrial fibrillation: Secondary | ICD-10-CM | POA: Diagnosis not present

## 2024-02-28 DIAGNOSIS — G8929 Other chronic pain: Secondary | ICD-10-CM | POA: Diagnosis not present

## 2024-02-28 NOTE — Patient Instructions (Addendum)
 Medication Instructions:  Your physician recommends that you continue on your current medications as directed. Please refer to the Current Medication list given to you today.   *If you need a refill on your cardiac medications before your next appointment, please call your pharmacy*  Lab Work: None ordered at this time   Follow-Up: At [facility], you and your health needs are our priority.  As part of our continuing mission to provide you with exceptional heart care, our providers are all part of one team.  This team includes your primary Cardiologist (physician) and Advanced Practice Providers or APPs (Physician Assistants and Nurse Practitioners) who all work together to provide you with the care you need, when you need it.  Your next appointment:   2 month(s)  Provider:   Ole Holts, MD

## 2024-03-19 DIAGNOSIS — E871 Hypo-osmolality and hyponatremia: Secondary | ICD-10-CM | POA: Diagnosis not present

## 2024-03-20 LAB — LIPID PANEL
Chol/HDL Ratio: 2 ratio (ref 0.0–4.4)
Cholesterol, Total: 165 mg/dL (ref 100–199)
HDL: 82 mg/dL (ref >39)
LDL Chol Calc (NIH): 70 mg/dL (ref 0–99)
Triglycerides: 65 mg/dL (ref 0–149)
VLDL Cholesterol Cal: 13 mg/dL (ref 5–40)

## 2024-03-20 LAB — COMPREHENSIVE METABOLIC PANEL WITH GFR
ALT: 28 IU/L (ref 0–32)
AST: 33 IU/L (ref 0–40)
Albumin: 4 g/dL (ref 3.8–4.9)
Alkaline Phosphatase: 114 IU/L (ref 49–135)
BUN/Creatinine Ratio: 21 (ref 9–23)
BUN: 18 mg/dL (ref 6–24)
Bilirubin Total: 0.3 mg/dL (ref 0.0–1.2)
CO2: 21 mmol/L (ref 20–29)
Calcium: 9.1 mg/dL (ref 8.7–10.2)
Chloride: 109 mmol/L — ABNORMAL HIGH (ref 96–106)
Creatinine, Ser: 0.86 mg/dL (ref 0.57–1.00)
Globulin, Total: 2.1 g/dL (ref 1.5–4.5)
Glucose: 85 mg/dL (ref 70–99)
Potassium: 4.5 mmol/L (ref 3.5–5.2)
Sodium: 143 mmol/L (ref 134–144)
Total Protein: 6.1 g/dL (ref 6.0–8.5)
eGFR: 79 mL/min/1.73 (ref >59)

## 2024-03-20 LAB — CORTISOL-AM, BLOOD: Cortisol - AM: 12.6 ug/dL (ref 6.2–19.4)

## 2024-03-20 LAB — T4, FREE: Free T4: 1.06 ng/dL (ref 0.82–1.77)

## 2024-03-20 LAB — TSH: TSH: 4.5 u[IU]/mL (ref 0.450–4.500)

## 2024-03-21 ENCOUNTER — Encounter: Payer: Self-pay | Admitting: "Endocrinology

## 2024-03-21 ENCOUNTER — Ambulatory Visit: Admitting: "Endocrinology

## 2024-03-21 VITALS — BP 116/86 | HR 84 | Ht 62.0 in | Wt 165.0 lb

## 2024-03-21 DIAGNOSIS — E871 Hypo-osmolality and hyponatremia: Secondary | ICD-10-CM

## 2024-03-21 DIAGNOSIS — E222 Syndrome of inappropriate secretion of antidiuretic hormone: Secondary | ICD-10-CM | POA: Diagnosis not present

## 2024-03-21 NOTE — Progress Notes (Signed)
 03/21/2024, 4:54 PM   Endocrinology follow-up note  Subjective:    Patient ID: Shelby Colon, female    DOB: Feb 19, 1966, PCP Gareth Mliss FALCON, FNP   Past Medical History:  Diagnosis Date   Abnormal MRI, cervical spine 10/17/2019   FINDINGS: Alignment: Reversal of the normal cervical lordosis with apex at C3-4. Trace retrolisthesis of C4 on C5, C5 on C6, and C6 on C7, with anterolisthesis of C7 on T1. Findings chronic and facet mediated.   Vertebrae: Vertebral body height maintained without evidence for acute or chronic fracture. Bone marrow signal intensity within normal limits. No discrete or worrisome osseous lesions. Pro   Abnormal Pap smear of cervix    h/o LSIL pap with HPV +; 01/2019 pap negative negative HPV   Alcohol use 02/09/2022   Altered mental status 12/08/2021   Altered mental status 12/08/2021   Annual physical exam 06/12/2020   Anxiety    Anxiety 06/12/2020   Anxiety and depression 11/30/2017   Atrial fibrillation (HCC)    Atrophy of right kidney 03/05/2019   Left kidney ok   B12 deficiency    Benign neoplasm of ascending colon    Brain cyst 12/10/2021   Cardiac murmur 02/14/2018   as a child   Chronic knee pain (Bilateral) 09/07/2018   09/21/2018 left total knee ortho [place] [facility] OP    Chronic pain syndrome    Colon polyps    Confusion 12/10/2021   Congenital absence of right kidney    Degeneration of lumbar intervertebral disc    Back surgery, left leg knee down numb and tingling   Depression    Early menopause    Elevated alkaline phosphatase level 02/23/2019   Elevated BP without diagnosis of hypertension 08/04/2021   Last Assessment & Plan: Formatting of this note might be different from the original. BP at last visit was 131/85 and today is 156/88.  Advised patient to come back within the next 2 to 4 weeks to recheck her blood pressure.  Goal BP is less than 140/90.    Gallstones    Gastric bypass status for obesity    2013, weight 258lb   Grade I diastolic dysfunction    Headache    History of   Heart murmur    MVP   History of chicken pox    HPV in female 11/30/2017   Hypercholesterolemia    Hypertension    Iron  deficiency    Iron  deficiency anemia 03/02/2019   Mild mitral regurgitation by prior echocardiogram    Mild tricuspid regurgitation by prior echocardiogram    Neuromuscular disorder (HCC)    Neuropathy    Paroxysmal SVT (supraventricular tachycardia)    Personal history of colonic polyps    Pharmacologic therapy 10/17/2019   Polyp of sigmoid colon    Problems influencing health status 10/17/2019   PTSD (post-traumatic stress disorder)    Rheumatoid arthritis (HCC)    as child    Scoliosis of lumbar spine    SIADH (syndrome of inappropriate ADH production) 01/01/2022   Solitary kidney, congenital    pt thinks has left kidney only no right kidney   Thrombocytosis 02/23/2019   Thyroid  disease    in the past  Transaminitis 12/08/2021   UTI (urinary tract infection)    Vitamin D  deficiency    Wears partial dentures    bottom   Past Surgical History:  Procedure Laterality Date   ANTERIOR CERVICAL DECOMPRESSION/DISCECTOMY FUSION 4 LEVELS N/A 11/28/2019   Procedure: ANTERIOR CERVICAL DECOMPRESSION/DISCECTOMY FUSION 4 LEVELS C3-7;  Surgeon: Clois Fret, MD;  Location: [facility] ORS;  Service: Neurosurgery;  Laterality: N/A;   ATRIAL FIBRILLATION ABLATION N/A 01/31/2024   Procedure: ATRIAL FIBRILLATION ABLATION;  Surgeon: Cindie Ole DASEN, MD;  Location: [facility];  Service: Cardiovascular;  Laterality: N/A;   AUGMENTATION MAMMAPLASTY Bilateral 2009   BACK SURGERY  11/04/2014   fusion of spine of lumbosacral region 2 rods and 16 screws    BREAST ENHANCEMENT SURGERY     CARDIAC ELECTROPHYSIOLOGY STUDY AND ABLATION  01/2024   COLONOSCOPY WITH PROPOFOL  N/A 01/06/2018   Procedure: COLONOSCOPY WITH PROPOFOL ;  Surgeon:  Janalyn Keene NOVAK, MD;  Location: [facility];  Service: Endoscopy;  Laterality: N/A;   COLONOSCOPY WITH PROPOFOL  N/A 03/19/2019   Procedure: COLONOSCOPY WITH PROPOFOL ;  Surgeon: Janalyn Keene NOVAK, MD;  Location: [facility] END[facility]COPY;  Service: Endoscopy;  Laterality: N/A;   COLONOSCOPY WITH PROPOFOL  N/A 02/24/2023   Procedure: COLONOSCOPY WITH PROPOFOL ;  Surgeon: Unk Corinn Skiff, MD;  Location: [facility];  Service: Endoscopy;  Laterality: N/A;   COSMETIC SURGERY     ESOPHAGOGASTRODUODENOSCOPY (EGD) WITH PROPOFOL  N/A 02/24/2023   Procedure: ESOPHAGOGASTRODUODENOSCOPY (EGD) WITH PROPOFOL ;  Surgeon: Unk Corinn Skiff, MD;  Location: [facility];  Service: Endoscopy;  Laterality: N/A;   excess skin removal     JOINT REPLACEMENT  09/20/2008   both knees done right was last year and left just done this   KNEE ARTHROSCOPY Bilateral    x 7 knees b/l (2 surgeries on right knee) total 1 bone graft and 5 arthroscopy total knee    POLYPECTOMY  02/24/2023   Procedure: POLYPECTOMY;  Surgeon: Unk Corinn Skiff, MD;  Location: [facility];  Service: Endoscopy;;   ROUX-EN-Y PROCEDURE     SPINE SURGERY  11/07/2014   2 rods and 16 screws   TOTAL KNEE ARTHROPLASTY     left 09/21/18 Dr. Amy II Geoffry Bury   TOTAL KNEE ARTHROPLASTY     right knee Dr. Curly Amy    Social History   Socioeconomic History   Marital status: Married    Spouse name: alex   Number of children: 0   Years of education: 16   Highest education level: Bachelor's degree (e.g., BA, AB, BS)  Occupational History   Not on file  Tobacco Use   Smoking status: Never   Smokeless tobacco: Never   Tobacco comments:    Never smoked  Vaping Use   Vaping status: Never Used  Substance and Sexual Activity   Alcohol use: Not Currently    Alcohol/week: 1.0 standard drink of alcohol    Types: 1 Glasses of wine per week    Comment: SOCIALLY. BUT JUST STOP.   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    BA degree    No guns, wears seat  belt, safe in relationship    Social Drivers of Health   Financial Resource Strain: High Risk (11/10/2023)   Overall Financial Resource Strain (CARDIA)    Difficulty of Paying Living Expenses: Hard  Food Insecurity: Food Insecurity Present (11/10/2023)   Hunger Vital Sign    Worried About Running Out of Food in the Last Year: Sometimes true    Ran Out of Food in the Last Year: Never true  Transportation Needs: No Transportation Needs (11/10/2023)   PRAPARE - Administrator, Civil Service (Medical): No    Lack of Transportation (Non-Medical): No  Physical Activity: Inactive (11/10/2023)   Exercise Vital Sign    Days of Exercise per Week: 0 days    Minutes of Exercise per Session: Not on file  Stress: Stress Concern Present (11/10/2023)   [employer] of Occupational Health - Occupational Stress Questionnaire    Feeling of Stress: Very much  Social Connections: Moderately Isolated (11/10/2023)   Social Connection and Isolation Panel    Frequency of Communication with Friends and Family: Three times a week    Frequency of Social Gatherings with Friends and Family: Once a week    Attends Religious Services: Never    Database administrator or Organizations: No    Attends Engineer, structural: Not on file    Marital Status: Married   Family History  Adopted: Yes  Problem Relation Age of Onset   Arthritis Mother    Drug abuse Mother        heriod OD   Early death Mother    Diabetes Father    Hypertension Father    Depression Father    Arthritis Father    Drug abuse Father     Diabetes Sister    Fibromyalgia Sister    Rashes / Skin problems Sister        PSORIASIS   Diabetes Brother    Osteoarthritis Maternal Grandmother    Aneurysm Maternal Aunt    Thyroid  disease Maternal Aunt    Fibromyalgia Maternal Aunt    Breast cancer Maternal Aunt    Cancer Maternal Aunt        breast cancer    Outpatient Encounter Medications as of 03/21/2024  Medication Sig   apixaban  (ELIQUIS ) 5 MG TABS tablet Take 1 tablet (5 mg total) by mouth 2 (two) times daily.   colchicine  0.6 MG tablet Take 1 tablet (0.6 mg total) by mouth 2 (two) times daily. No refills necessary, post procedure medication. (Patient not taking: Reported on 03/21/2024)   Cyanocobalamin  (B-12) 1000 MCG SUBL Place 1 tablet under the tongue daily.   cyclobenzaprine  (FLEXERIL ) 10 MG tablet Take 1 tablet (10 mg total) by mouth 3 (three) times daily as needed for muscle spasms. This can make you sleepy.   diclofenac Sodium (VOLTAREN) 1 % GEL Apply 2 g topically as needed (Pain).   estradiol  (VIVELLE -DOT) 0.025 MG/24HR Place 1 patch onto the skin 2 (two) times a week.   fluticasone  (FLONASE ) 50 MCG/ACT nasal spray Place 2 sprays into both nostrils daily.   metoprolol  succinate (TOPROL -XL) 50 MG 24 hr tablet TAKE 1 TABLET BY MOUTH DAILY (Patient taking differently: Take 50 mg by mouth 2 (two) times daily. Half tablet (25 mg) twice daily)   pantoprazole  (PROTONIX ) 40 MG tablet Take 1 tablet (40 mg total) by mouth daily. No refill necessary, post procedure medication. (Patient not taking: Reported on 03/21/2024)   pregabalin  (LYRICA ) 50 MG capsule TAKE ONE CAPSULE BY  MOUTH TWICE A DAY AND TAKE ONE CAPSULE BY MOUTH AT BEDTIME (Patient taking differently: Take 50 mg by mouth 2 (two) times daily. TAKE 3 CAPSULES (150 MG) BY MOUTH IN THE MORNING AND TAKE TWO CAPSULES (100 MG) BY MOUTH AT BEDTIME)   progesterone  (PROMETRIUM ) 100 MG capsule Take 1 capsule (100 mg total) by mouth daily.   Vitamin D , Ergocalciferol , (DRISDOL )  1.25 MG (50000 UNIT) CAPS capsule Take 1 capsule (50,000 Units total) by mouth every 7 (seven) days.   No facility-administered encounter medications on file as of 03/21/2024.   ALLERGIES: Allergies  Allergen Reactions   Morphine Nausea And Vomiting    VACCINATION STATUS: Immunization History  Administered Date(s) Administered   Hepb-cpg 08/18/2022, 09/16/2022   Influenza, Quadrivalent, Recombinant, Inj, Pf 04/14/2019   Influenza,inj,Quad PF,6+ Mos 06/10/2020   Influenza-Unspecified 04/14/2019   PFIZER(Purple Top)SARS-COV-2 Vaccination 12/22/2019, 01/12/2020   Td 02/01/2017   Tdap 02/01/2017    HPI Shelby Colon is 58 y.o. female who was previously seen due to hyponatremia which was believed to be secondary to SIADH.   She is returning with labs which shows resolution of her previous hyponatremia.  She continues to follow the fluid restrictions discussed with her.     She maintains 1-1.5 L of water  daily, with most recent labs showing sodium of 143. She is now down to only social drinking of alcohol. She denies medical problems such as cirrhosis, CHF, nor kidney disease. She is on Cymbalta  and pregabalin . This patient underwent gastric surgery in the past when she weighed up to 250 pounds, currently 160- 165 pounds, regaining in most recent weeks.  Her MRI of the brain showed stable appearance of right middle cranial fossa arachnoid cyst measuring 2.6 cm.  A few nonspecific punctate foci of T2 hyperintensity within the white matter of the cerebral hemispheres.  Inflammatory or infectious processes or demyelinating diseases was not excluded.  She never had problems with her sodium.  She denies any prior history of adrenal, thyroid , pituitary dysfunctions.  Review of Systems  Constitutional: + Fluctuating body weight, progressively gaining. history of bariatric surgery which allowed her to lose approximately 100 pounds,  no fatigue, no subjective hyperthermia, no subjective  hypothermia   Objective:       03/21/2024    2:12 PM 02/28/2024    2:24 PM 01/31/2024    1:00 PM  Vitals with BMI  Height 5' 2 5' 2   Weight 165 lbs 156 lbs   BMI 30.17 28.53   Systolic 116 122 882  Diastolic 86 86 77  Pulse 84 62 75    BP 116/86   Pulse 84   Ht 5' 2 (1.575 m)   Wt 165 lb (74.8 kg)   BMI 30.18 kg/m   Wt Readings from Last 3 Encounters:  03/21/24 165 lb (74.8 kg)  02/28/24 156 lb (70.8 kg)  01/31/24 153 lb (69.4 kg)    Physical Exam  Constitutional:  Body mass index is 30.18 kg/m.,  not in acute distress, normal state of mind  CMP ( most recent) CMP     Component Value Date/Time   NA 143 03/19/2024 0853   K 4.5 03/19/2024 0853   CL 109 (H) 03/19/2024 0853   CO2 21 03/19/2024 0853   GLUCOSE 85 03/19/2024 0853   GLUCOSE 71 05/16/2023 1552   BUN 18 03/19/2024 0853   CREATININE 0.86 03/19/2024 0853   CREATININE 0.74 12/05/2017 1044   CALCIUM 9.1 03/19/2024 0853   PROT 6.1 03/19/2024 0853  ALBUMIN 4.0 03/19/2024 0853   AST 33 03/19/2024 0853   ALT 28 03/19/2024 0853   ALKPHOS 114 03/19/2024 0853   BILITOT 0.3 03/19/2024 0853   GFRNONAA >60 02/10/2022 0448   GFRAA >60 11/21/2019 0907     Diabetic Labs (most recent): Lab Results  Component Value Date   HGBA1C 5.2 02/13/2018     Lipid Panel ( most recent) Lipid Panel     Component Value Date/Time   CHOL 165 03/19/2024 0853   TRIG 65 03/19/2024 0853   HDL 82 03/19/2024 0853   CHOLHDL 2.0 03/19/2024 0853   CHOLHDL 1.8 12/05/2017 1044   LDLCALC 70 03/19/2024 0853   LDLCALC 74 12/05/2017 1044   LABVLDL 13 03/19/2024 0853      Lab Results  Component Value Date   TSH 4.500 03/19/2024   TSH 2.69 05/16/2023   TSH 3.135 02/09/2022   TSH 3.817 12/08/2021   TSH 2.890 02/15/2019   FREET4 1.06 03/19/2024       Assessment & Plan:   1. Hyponatremia 2. SIADH (syndrome of inappropriate ADH production) (HCC)   - I have reviewed her new and available  records . - Based on these  reviews, her previous hyponatremia has largely resolved.   She has no acute complaints today. Review of her records indicate a possibility of SIADH. This could be of multiple etiology including EtOH , Cymbalta , idiopathic.  She is advised to continue on on fluid restrictions not to exceed  2 L a day.  She is allowed to drink 1 L of water  per day and another  liter of  other  fluids. She is advised to wean off of alcohol completely. Her more recent a.m. cortisol is normal.  She would not need intervention with steroids for now. -She is encouraged to maintain her visit with a neurologist for better explanation of her MRI findings. - Her previsit labs indicate normal thyroid  function normal cortisol.    - she is advised to maintain close follow up with Gareth Mliss FALCON, FNP for primary care needs.   I spent  20  minutes in the care of the patient today including review of labs from Thyroid  Function, CMP, and other relevant labs ; imaging/biopsy records (current and previous including abstractions from other facilities); face-to-face time discussing  her lab results and symptoms, medications doses, her options of short and long term treatment based on the latest standards of care / guidelines;   and documenting the encounter.  Brenlee Koskela  participated in the discussions, expressed understanding, and voiced agreement with the above plans.  All questions were answered to her satisfaction. she is encouraged to contact clinic should she have any questions or concerns prior to her return visit.   Follow up plan: Return if symptoms worsen or fail to improve.   Ranny Earl, MD [facility] [address] [place], [place] [address] Phone: [phone]  Fax: [phone]     03/21/2024, 4:54 PM  This note was partially dictated with voice recognition software. Similar sounding words can be transcribed inadequately or may not  be corrected upon  review.

## 2024-03-26 ENCOUNTER — Encounter

## 2024-04-09 ENCOUNTER — Encounter: Payer: Self-pay | Admitting: Nurse Practitioner

## 2024-04-11 ENCOUNTER — Ambulatory Visit: Admitting: Family Medicine

## 2024-05-07 NOTE — Progress Notes (Unsigned)
  Electrophysiology Office Follow up Visit Note:    Date:  05/07/2024   ID:  Monque Haggar, DOB 1965/12/03, MRN [id number]  PCP:  Gareth Mliss FALCON, FNP  [facility] Cardiologist:  Redell Cave, MD  [facility] Electrophysiologist:  OLE ONEIDA HOLTS, MD    Interval History:     Shelby Colon is a 58 y.o. female who presents for a follow up visit.  The patient had a catheter ablation in January 31, 2024. The patient saw Elvie February 28, 2024.  At that appointment she was doing well without recurrence of arrhythmia.  She takes Eliquis  for stroke prophylaxis.         Past medical, surgical, social and family history were reviewed.  ROS:   Please see the history of present illness.    All other systems reviewed and are negative.  EKGs/Labs/Other Studies Reviewed:    The following studies were reviewed today:          Physical Exam:    VS:  There were no vitals taken for this visit.    Wt Readings from Last 3 Encounters:  03/21/24 165 lb (74.8 kg)  02/28/24 156 lb (70.8 kg)  01/31/24 153 lb (69.4 kg)     GEN: no distress CARD: RRR, No MRG RESP: No IWOB. CTAB.      ASSESSMENT:    No diagnosis found. PLAN:    In order of problems listed above:  #Paroxysmal atrial fibrillation Doing well after January 31, 2024 catheter ablation.  No known recurrence. Long discussion with the patient about her stroke risk.  Her CHA2DS2-VASc is 1.  We reviewed the data supporting discontinuation of systemic anticoagulation after a seemingly successful catheter ablation.  Given the lack of recurrence, I think it would be reasonable for her to stop her Eliquis  at this time and start aspirin  81 mg by mouth once daily.     Signed, Ole Holts, MD, [facility], [facility] 05/07/2024 8:26 AM    Electrophysiology [facility]

## 2024-05-09 ENCOUNTER — Ambulatory Visit: Attending: Cardiology | Admitting: Cardiology

## 2024-05-09 ENCOUNTER — Encounter: Payer: Self-pay | Admitting: Cardiology

## 2024-05-09 VITALS — BP 115/80 | HR 71 | Ht 62.0 in | Wt 157.6 lb

## 2024-05-09 DIAGNOSIS — I480 Paroxysmal atrial fibrillation: Secondary | ICD-10-CM | POA: Diagnosis not present

## 2024-05-09 MED ORDER — ASPIRIN 81 MG PO TBEC: 81.0000 mg | DELAYED_RELEASE_TABLET | Freq: Every day | ORAL | Status: AC

## 2024-05-09 NOTE — Patient Instructions (Signed)
 Medication Instructions:  Your physician has recommended you make the following change in your medication:  1) STOP taking Eliquis   2) START taking Aspirin 81 mg once daily *If you need a refill on your cardiac medications before your next appointment, please call your pharmacy*  Follow-Up: At [facility], you and your health needs are our priority.  As part of our continuing mission to provide you with exceptional heart care, our providers are all part of one team.  This team includes your primary Cardiologist (physician) and Advanced Practice Providers or APPs (Physician Assistants and Nurse Practitioners) who all work together to provide you with the care you need, when you need it.  Your next appointment:   1 year  Provider:   Suzann Riddle, NP

## 2024-05-10 ENCOUNTER — Ambulatory Visit: Admitting: Cardiology

## 2024-06-05 ENCOUNTER — Inpatient Hospital Stay: Attending: Oncology

## 2024-06-05 DIAGNOSIS — E538 Deficiency of other specified B group vitamins: Secondary | ICD-10-CM | POA: Insufficient documentation

## 2024-06-05 DIAGNOSIS — D508 Other iron deficiency anemias: Secondary | ICD-10-CM

## 2024-06-05 DIAGNOSIS — Z79899 Other long term (current) drug therapy: Secondary | ICD-10-CM | POA: Insufficient documentation

## 2024-06-05 DIAGNOSIS — Z9884 Bariatric surgery status: Secondary | ICD-10-CM | POA: Diagnosis not present

## 2024-06-05 DIAGNOSIS — Z803 Family history of malignant neoplasm of breast: Secondary | ICD-10-CM | POA: Insufficient documentation

## 2024-06-05 DIAGNOSIS — D509 Iron deficiency anemia, unspecified: Secondary | ICD-10-CM | POA: Diagnosis present

## 2024-06-05 LAB — VITAMIN B12: Vitamin B-12: 793 pg/mL (ref 180–914)

## 2024-06-05 LAB — FERRITIN: Ferritin: 189 ng/mL (ref 11–307)

## 2024-06-05 LAB — FOLATE: Folate: 5.9 ng/mL — ABNORMAL LOW

## 2024-06-05 LAB — IRON AND TIBC
Iron: 85 ug/dL (ref 28–170)
Saturation Ratios: 29 % (ref 10.4–31.8)
TIBC: 297 ug/dL (ref 250–450)
UIBC: 212 ug/dL

## 2024-06-05 LAB — CBC WITH DIFFERENTIAL (CANCER CENTER ONLY)
Abs Immature Granulocytes: 0.03 K/uL (ref 0.00–0.07)
Basophils Absolute: 0.1 K/uL (ref 0.0–0.1)
Basophils Relative: 1 %
Eosinophils Absolute: 0.1 K/uL (ref 0.0–0.5)
Eosinophils Relative: 2 %
HCT: 38.9 % (ref 36.0–46.0)
Hemoglobin: 12 g/dL (ref 12.0–15.0)
Immature Granulocytes: 0 %
Lymphocytes Relative: 22 %
Lymphs Abs: 1.5 K/uL (ref 0.7–4.0)
MCH: 26.9 pg (ref 26.0–34.0)
MCHC: 30.8 g/dL (ref 30.0–36.0)
MCV: 87.2 fL (ref 80.0–100.0)
Monocytes Absolute: 0.4 K/uL (ref 0.1–1.0)
Monocytes Relative: 6 %
Neutro Abs: 4.7 K/uL (ref 1.7–7.7)
Neutrophils Relative %: 69 %
Platelet Count: 408 K/uL — ABNORMAL HIGH (ref 150–400)
RBC: 4.46 MIL/uL (ref 3.87–5.11)
RDW: 13.8 % (ref 11.5–15.5)
WBC Count: 6.7 K/uL (ref 4.0–10.5)
nRBC: 0 % (ref 0.0–0.2)

## 2024-06-07 ENCOUNTER — Inpatient Hospital Stay

## 2024-06-07 ENCOUNTER — Inpatient Hospital Stay: Admitting: Oncology

## 2024-06-07 ENCOUNTER — Encounter: Payer: Self-pay | Admitting: Oncology

## 2024-06-07 VITALS — BP 137/89 | HR 77 | Temp 97.7°F | Resp 18 | Wt 160.4 lb

## 2024-06-07 DIAGNOSIS — E538 Deficiency of other specified B group vitamins: Secondary | ICD-10-CM | POA: Diagnosis not present

## 2024-06-07 DIAGNOSIS — Z9884 Bariatric surgery status: Secondary | ICD-10-CM

## 2024-06-07 DIAGNOSIS — D508 Other iron deficiency anemias: Secondary | ICD-10-CM | POA: Diagnosis not present

## 2024-06-07 DIAGNOSIS — D509 Iron deficiency anemia, unspecified: Secondary | ICD-10-CM | POA: Diagnosis not present

## 2024-06-07 MED ORDER — FOLIC ACID 1 MG PO TABS: 1.0000 mg | ORAL_TABLET | Freq: Every day | ORAL | 0 refills | Status: AC

## 2024-06-07 NOTE — Progress Notes (Signed)
 " Hematology/Oncology Progress note [phone] [phone]      CHIEF COMPLAINTS/REASON FOR VISIT:  Follow up for iron  deficiency anemia.   ASSESSMENT & PLAN:   Iron  deficiency anemia Labs are reviewed and discussed with patient.  Lab Results  Component Value Date   HGB 12.0 06/05/2024   TIBC 297 06/05/2024   IRONPCTSAT 29 06/05/2024   FERRITIN 189 06/05/2024    Iron  panel and hemoglobin are within normal limits.  I will hold off Venofer .    H/O gastric bypass At risk of Vitamin deficiency.  Adequate B12 continue sublingual B12 1000mcg   Folate deficiency Recommend folic acid  supplementation 1mg  daily x 3 months  Orders Placed This Encounter  Procedures   CBC with Differential (Cancer Center Only)    Standing Status:   Future    Expected Date:   12/05/2024    Expiration Date:   03/05/2025   Iron  and TIBC    Standing Status:   Future    Expected Date:   12/05/2024    Expiration Date:   03/05/2025   Ferritin    Standing Status:   Future    Expected Date:   12/05/2024    Expiration Date:   03/05/2025   Vitamin B12    Standing Status:   Future    Expected Date:   12/05/2024    Expiration Date:   03/05/2025   Folate    Standing Status:   Future    Expected Date:   12/05/2024    Expiration Date:   03/05/2025   Follow up in 6 months.  All questions were answered. The patient knows to call the clinic with any problems, questions or concerns.  Zelphia Cap, MD, PhD [facility] Hematology Oncology 06/07/2024   HISTORY OF PRESENTING ILLNESS:  Shelby Colon is a 59 y.o. female who was seen in consultation at the request of Hope Merle, MD for evaluation of thromcbocytosis Reviewed patient's labs.  02/15/2019 Labs showed elevated platelet counts at 453, Hb 11.3,  wbc 7.3.   Reviewed patient's previous labs. Thrombocytosis onset is chronic onset , duration since 2019.  No aggravating or elevated factors. Associated symptoms or signs:  Denies weight loss,  fever, chills, fatigue, night sweats.  Context:  Smoking history: never smoker Family history of polycythemia. denies History of iron  deficiency anemia. Yes.  History of DVT, denies History of gastric bypass. Yes.  Had a colonoscopy 03/19/2019.   12/28/22 She has had SOB, sensation of throat tightness and coughing after receiving  Venofer  despite previously tolerated Venofer  in 2022. Symptoms include  She was given IV steroid, IV benadryl , and sent to ER where she received racemic epinephrine  nebulizer, IV pepcid , IV fluids.  There was no oropharyngeal edema. She appeared anxious and received Ativan . Symptoms resolved.  It was felt that her infusion reaction symptoms are due to anxiety.  Later, she was able to tolerate additional Venofer  treatments with benadryl  and ativan  as premed.   INTERVAL HISTORY Shelby Colon is a 59 y.o. female who has above history reviewed by me today presents for follow up visit for management of iron  deficiency anemia Today she reports feeling well.   + History of atrial fibrillation status post ablation.  She finished 3 months of Eliquis .  She feels much better.  Shortness of breath has resolved. She is currently on hormone replacement therapy which improves her sleep disturbance.  No new complaints.   Review of Systems  Constitutional:  Positive for fatigue. Negative for appetite change,  chills and fever.  HENT:   Negative for hearing loss and voice change.   Eyes:  Negative for eye problems.  Respiratory:  Negative for chest tightness and cough.   Cardiovascular:  Negative for chest pain.  Gastrointestinal:  Negative for abdominal distention, abdominal pain and blood in stool.  Endocrine: Negative for hot flashes.  Genitourinary:  Negative for difficulty urinating and frequency.   Musculoskeletal:  Negative for arthralgias.  Skin:  Negative for itching and rash.  Neurological:  Negative for extremity weakness.  Hematological:  Negative for adenopathy.   Psychiatric/Behavioral:  Negative for confusion.     MEDICAL HISTORY:  Past Medical History:  Diagnosis Date   Abnormal MRI, cervical spine 10/17/2019   FINDINGS: Alignment: Reversal of the normal cervical lordosis with apex at C3-4. Trace retrolisthesis of C4 on C5, C5 on C6, and C6 on C7, with anterolisthesis of C7 on T1. Findings chronic and facet mediated.   Vertebrae: Vertebral body height maintained without evidence for acute or chronic fracture. Bone marrow signal intensity within normal limits. No discrete or worrisome osseous lesions. Pro   Abnormal Pap smear of cervix    h/o LSIL pap with HPV +; 01/2019 pap negative negative HPV   Alcohol use 02/09/2022   Altered mental status 12/08/2021   Altered mental status 12/08/2021   Annual physical exam 06/12/2020   Anxiety    Anxiety 06/12/2020   Anxiety and depression 11/30/2017   Atrial fibrillation (HCC)    Atrophy of right kidney 03/05/2019   Left kidney ok   B12 deficiency    Benign neoplasm of ascending colon    Brain cyst 12/10/2021   Cardiac murmur 02/14/2018   as a child   Chronic knee pain (Bilateral) 09/07/2018   09/21/2018 left total knee ortho [place] Dr. [facility]    Chronic pain syndrome    Colon polyps    Confusion 12/10/2021   Congenital absence of right kidney    Degeneration of lumbar intervertebral disc    Back surgery, left leg knee down numb and tingling   Depression    Early menopause    Elevated alkaline phosphatase level 02/23/2019   Elevated BP without diagnosis of hypertension 08/04/2021   Last Assessment & Plan: Formatting of this note might be different from the original. BP at last visit was 131/85 and today is 156/88.  Advised patient to come back within the next 2 to 4 weeks to recheck her blood pressure.  Goal BP is less than 140/90.   Gallstones    Gastric bypass status for obesity    2013, weight 258lb   Grade I diastolic dysfunction    Headache    History of    Heart murmur    MVP   History of chicken pox    HPV in female 11/30/2017   Hypercholesterolemia    Hypertension    Iron  deficiency    Iron  deficiency anemia 03/02/2019   Mild mitral regurgitation by prior echocardiogram    Mild tricuspid regurgitation by prior echocardiogram    Neuromuscular disorder (HCC)    Neuropathy    Paroxysmal SVT (supraventricular tachycardia)    Personal history of colonic polyps    Pharmacologic therapy 10/17/2019   Polyp of sigmoid colon    Problems influencing health status 10/17/2019   PTSD (post-traumatic stress disorder)    Rheumatoid arthritis (HCC)    as child    Scoliosis of lumbar spine    SIADH (syndrome of inappropriate ADH production)  01/01/2022   Solitary kidney, congenital    pt thinks has left kidney only no right kidney   Thrombocytosis 02/23/2019   Thyroid  disease    in the past    Transaminitis 12/08/2021   UTI (urinary tract infection)    Vitamin D  deficiency    Wears partial dentures    bottom    SURGICAL HISTORY: Past Surgical History:  Procedure Laterality Date   ANTERIOR CERVICAL DECOMPRESSION/DISCECTOMY FUSION 4 LEVELS N/A 11/28/2019   Procedure: ANTERIOR CERVICAL DECOMPRESSION/DISCECTOMY FUSION 4 LEVELS C3-7;  Surgeon: Clois Fret, MD;  Location: [facility] ORS;  Service: Neurosurgery;  Laterality: N/A;   ATRIAL FIBRILLATION ABLATION N/A 01/31/2024   Procedure: ATRIAL FIBRILLATION ABLATION;  Surgeon: Cindie Ole DASEN, MD;  Location: [facility];  Service: Cardiovascular;  Laterality: N/A;   AUGMENTATION MAMMAPLASTY Bilateral 2009   BACK SURGERY  11/04/2014   fusion of spine of lumbosacral region 2 rods and 16 screws    BREAST ENHANCEMENT SURGERY     CARDIAC ELECTROPHYSIOLOGY STUDY AND ABLATION  01/2024   COLONOSCOPY WITH PROPOFOL  N/A 01/06/2018   Procedure: COLONOSCOPY WITH PROPOFOL ;  Surgeon: Janalyn Keene NOVAK, MD;  Location: [facility] END[facility]COPY;  Service: Endoscopy;  Laterality: N/A;   COLONOSCOPY WITH  PROPOFOL  N/A 03/19/2019   Procedure: COLONOSCOPY WITH PROPOFOL ;  Surgeon: Janalyn Keene NOVAK, MD;  Location: [facility] ENDOSCOPY;  Service: Endoscopy;  Laterality: N/A;   COLONOSCOPY WITH PROPOFOL  N/A 02/24/2023   Procedure: COLONOSCOPY WITH PROPOFOL ;  Surgeon: Unk Corinn Skiff, MD;  Location: [facility];  Service: Endoscopy;  Laterality: N/A;   COSMETIC SURGERY     ESOPHAGOGASTRODUODENOSCOPY (EGD) WITH PROPOFOL  N/A 02/24/2023   Procedure: ESOPHAGOGASTRODUODENOSCOPY (EGD) WITH PROPOFOL ;  Surgeon: Unk Corinn Skiff, MD;  Location: [facility];  Service: Endoscopy;  Laterality: N/A;   excess skin removal     JOINT REPLACEMENT  09/20/2008   both knees done right was last year and left just done this   KNEE ARTHROSCOPY Bilateral    x 7 knees b/l (2 surgeries on right knee) total 1 bone graft and 5 arthroscopy total knee    POLYPECTOMY  02/24/2023   Procedure: POLYPECTOMY;  Surgeon: Unk Corinn Skiff, MD;  Location: [facility];  Service: Endoscopy;;   ROUX-EN-Y PROCEDURE     SPINE SURGERY  11/07/2014   2 rods and 16 screws   TOTAL KNEE ARTHROPLASTY     left 09/21/18 Dr. Amy II Geoffry Bury   TOTAL KNEE ARTHROPLASTY     right knee Dr. Curly Amy     SOCIAL HISTORY: Social History   Socioeconomic History   Marital status: Married    Spouse name: alex   Number of children: 0   Years of education: 16   Highest education level: Bachelor's degree (e.g., BA, AB, BS)  Occupational History   Not on file  Tobacco Use   Smoking status: Never   Smokeless tobacco: Never   Tobacco comments:    Never smoked  Vaping Use   Vaping status: Never Used  Substance and Sexual Activity   Alcohol use: Not Currently    Alcohol/week: 1.0 standard drink of alcohol    Types: 1 Glasses of wine per week    Comment: SOCIALLY. BUT JUST STOP.   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    BA degree    No guns, wears seat belt, safe in relationship    Social Drivers of Health   Tobacco Use: Low Risk (05/09/2024)   Patient History    Smoking Tobacco Use: Never    Smokeless Tobacco Use: Never    Passive Exposure: Not on file  Financial Resource Strain: High Risk (11/10/2023)   Overall Financial Resource Strain (CARDIA)    Difficulty of Paying Living Expenses: Hard  Food Insecurity: Food Insecurity Present (11/10/2023)   Epic    Worried About Programme Researcher, Broadcasting/film/video in the Last Year: Sometimes true    Ran Out of Food in the Last Year: Never true  Transportation Needs: No Transportation Needs (11/10/2023)   Epic    Lack of Transportation (Medical): No    Lack of Transportation (Non-Medical): No  Physical Activity: Inactive (11/10/2023)   Exercise Vital Sign    Days of Exercise per Week: 0 days    Minutes of Exercise per Session: Not on file  Stress: Stress Concern Present (11/10/2023)   [employer] of Occupational Health - Occupational Stress Questionnaire    Feeling of Stress: Very much  Social Connections: Moderately Isolated (11/10/2023)   Social Connection and Isolation Panel    Frequency of Communication with Friends and Family: Three times a week    Frequency of Social Gatherings with Friends and Family: Once a week    Attends Religious Services: Never    Database Administrator or Organizations: No    Attends Engineer, Structural: Not on file    Marital Status: Married  Catering Manager Violence: Not At Risk (11/08/2023)   Humiliation, Afraid, Rape, and Kick questionnaire    Fear of Current or Ex-Partner: No    Emotionally Abused: No    Physically Abused: No    Sexually Abused: No  Depression  (PHQ2-9): Medium Risk (01/04/2024)   Depression (PHQ2-9)    PHQ-2 Score: 7  Alcohol Screen: Low Risk (11/10/2023)   Alcohol Screen    Last Alcohol Screening Score (AUDIT): 1  Housing: High Risk (11/10/2023)   Epic    Unable to Pay for Housing in the Last Year: Yes    Number of Times Moved in the Last Year: 2    Homeless in the Last Year: No  Utilities: Not At Risk (11/08/2023)   [facility] Utilities    Threatened with loss of utilities: No  Health Literacy: Adequate Health Literacy (11/08/2023)   B1300 Health Literacy    Frequency of need for help with medical instructions: Never    FAMILY HISTORY: Family History  Adopted: Yes  Problem Relation Age of Onset   Arthritis Mother    Drug abuse Mother        heriod OD   Early death Mother    Diabetes Father    Hypertension Father    Depression Father    Arthritis Father    Drug abuse Father    Diabetes Sister    Fibromyalgia Sister    Rashes / Skin problems Sister        PSORIASIS   Diabetes Brother    Osteoarthritis Maternal Grandmother    Aneurysm Maternal Aunt    Thyroid  disease Maternal Aunt    Fibromyalgia Maternal Aunt    Breast cancer Maternal Aunt    Cancer Maternal Aunt  breast cancer     ALLERGIES:  is allergic to morphine.  MEDICATIONS:  Current Outpatient Medications  Medication Sig Dispense Refill   aspirin  EC 81 MG tablet Take 1 tablet (81 mg total) by mouth daily. Swallow whole.     cyclobenzaprine  (FLEXERIL ) 10 MG tablet Take 1 tablet (10 mg total) by mouth 3 (three) times daily as needed for muscle spasms. This can make you sleepy. 60 tablet 0   estradiol  (VIVELLE -DOT) 0.025 MG/24HR Place 1 patch onto the skin 2 (two) times a week. 24 patch 1   fluticasone  (FLONASE ) 50 MCG/ACT nasal spray Place 2 sprays into both nostrils daily. 16 g 6   metoprolol  succinate (TOPROL -XL) 50 MG 24 hr tablet TAKE 1 TABLET BY MOUTH DAILY (Patient taking differently: Take 50 mg by mouth 2 (two) times daily. Half tablet (25 mg)  twice daily) 90 tablet 3   pregabalin  (LYRICA ) 50 MG capsule TAKE ONE CAPSULE BY MOUTH TWICE A DAY AND TAKE ONE CAPSULE BY MOUTH AT BEDTIME (Patient taking differently: Take 50 mg by mouth 2 (two) times daily. TAKE 3 CAPSULES (150 MG) BY MOUTH IN THE MORNING AND TAKE TWO CAPSULES (100 MG) BY MOUTH AT BEDTIME) 90 capsule 0   progesterone  (PROMETRIUM ) 100 MG capsule Take 1 capsule (100 mg total) by mouth daily. 90 capsule 1   Vitamin D , Ergocalciferol , (DRISDOL ) 1.25 MG (50000 UNIT) CAPS capsule Take 1 capsule (50,000 Units total) by mouth every 7 (seven) days. 12 capsule 1   No current facility-administered medications for this visit.     PHYSICAL EXAMINATION: ECOG PERFORMANCE STATUS: 1 - Symptomatic but completely ambulatory There were no vitals filed for this visit.  There were no vitals filed for this visit.   Physical Exam Constitutional:      General: She is not in acute distress. HENT:     Head: Normocephalic and atraumatic.  Eyes:     General: No scleral icterus. Cardiovascular:     Rate and Rhythm: Normal rate and regular rhythm.  Pulmonary:     Effort: Pulmonary effort is normal.  Abdominal:     General: Bowel sounds are normal. There is no distension.     Palpations: Abdomen is soft.  Musculoskeletal:        General: No deformity. Normal range of motion.     Cervical back: Normal range of motion and neck supple.  Skin:    Findings: No erythema or rash.  Neurological:     Mental Status: She is alert and oriented to person, place, and time. Mental status is at baseline.     Cranial Nerves: No cranial nerve deficit.  Psychiatric:        Mood and Affect: Mood normal.      LABORATORY DATA:  I have reviewed the data as listed Lab Results  Component Value Date   WBC 6.7 06/05/2024   HGB 12.0 06/05/2024   HCT 38.9 06/05/2024   MCV 87.2 06/05/2024   PLT 408 (H) 06/05/2024   Recent Labs    09/06/23 0929 12/07/23 1047 01/20/24 1101 03/19/24 0853  NA 144 142  142 143  K 4.3 4.6 4.5 4.5  CL 106 105 106 109*  CO2 23 21 20 21   GLUCOSE 91 77 74 85  BUN 12 22 14 18   CREATININE 0.74 0.89 0.84 0.86  CALCIUM 9.5 9.3 9.4 9.1  PROT 6.8  --   --  6.1  ALBUMIN 4.6  --   --  4.0  AST 25  --   --  33  ALT 21  --   --  28  ALKPHOS 118  --   --  114  BILITOT 0.3  --   --  0.3   Iron /TIBC/Ferritin/ %Sat    Component Value Date/Time   IRON  85 06/05/2024 1109   IRON  122 03/24/2023 1011   TIBC 297 06/05/2024 1109   TIBC 328 03/24/2023 1011   FERRITIN 189 06/05/2024 1109   FERRITIN 236 (H) 03/24/2023 1011   IRONPCTSAT 29 06/05/2024 1109   IRONPCTSAT 37 03/24/2023 1011   IRONPCTSAT 31 12/05/2017 1044      RADIOGRAPHIC STUDIES: I have personally reviewed the radiological images as listed and agreed with the findings in the report. No results found.     "

## 2024-06-07 NOTE — Assessment & Plan Note (Addendum)
 At risk of Vitamin deficiency.  Adequate B12 continue sublingual B12 1000mcg

## 2024-06-07 NOTE — Assessment & Plan Note (Addendum)
 Labs are reviewed and discussed with patient.  Lab Results  Component Value Date   HGB 12.0 06/05/2024   TIBC 297 06/05/2024   IRONPCTSAT 29 06/05/2024   FERRITIN 189 06/05/2024    Iron  panel and hemoglobin are within normal limits.  I will hold off Venofer .

## 2024-06-07 NOTE — Assessment & Plan Note (Addendum)
 Recommend folic acid  supplementation 1mg  daily x 3 months

## 2024-06-15 ENCOUNTER — Other Ambulatory Visit: Payer: Self-pay | Admitting: Nurse Practitioner

## 2024-06-15 DIAGNOSIS — N959 Unspecified menopausal and perimenopausal disorder: Secondary | ICD-10-CM

## 2024-06-15 NOTE — Telephone Encounter (Signed)
 Requested Prescriptions  Pending Prescriptions Disp Refills   estradiol  (VIVELLE -DOT) 0.025 MG/24HR [Pharmacy Med Name: ESTRADIOL  0.025 MG PATCH (SEMIWKLY)] 8 patch     Sig: APPLY 1 PATCH TO THE SKIN 2 TIMES A WEEK     OB/GYN:  Estrogens  Passed - 06/15/2024  2:07 PM      Passed - Mammogram is up-to-date per Health Maintenance      Passed - Last BP in normal range    BP Readings from Last 1 Encounters:  06/07/24 137/89         Passed - Valid encounter within last 12 months    Recent Outpatient Visits           4 months ago Pruritic dermatitis   [facility] Gareth Mliss FALCON, FNP   5 months ago Primary hypertension   [facility] Gareth Mliss FALCON, FNP       Future Appointments             In 3 weeks Gareth, Mliss FALCON, FNP [facility] [place]

## 2024-06-20 ENCOUNTER — Ambulatory Visit: Admitting: Podiatry

## 2024-07-05 ENCOUNTER — Other Ambulatory Visit: Payer: Self-pay | Admitting: Nurse Practitioner

## 2024-07-05 DIAGNOSIS — N959 Unspecified menopausal and perimenopausal disorder: Secondary | ICD-10-CM

## 2024-07-06 ENCOUNTER — Telehealth: Payer: Self-pay | Admitting: Pharmacy Technician

## 2024-07-06 ENCOUNTER — Ambulatory Visit (INDEPENDENT_AMBULATORY_CARE_PROVIDER_SITE_OTHER): Admitting: Nurse Practitioner

## 2024-07-06 ENCOUNTER — Encounter: Payer: Self-pay | Admitting: Nurse Practitioner

## 2024-07-06 ENCOUNTER — Other Ambulatory Visit (HOSPITAL_COMMUNITY): Payer: Self-pay

## 2024-07-06 VITALS — BP 116/88 | HR 74 | Temp 97.7°F | Ht 62.0 in | Wt 160.0 lb

## 2024-07-06 DIAGNOSIS — E66811 Obesity, class 1: Secondary | ICD-10-CM

## 2024-07-06 DIAGNOSIS — E7800 Pure hypercholesterolemia, unspecified: Secondary | ICD-10-CM

## 2024-07-06 DIAGNOSIS — E222 Syndrome of inappropriate secretion of antidiuretic hormone: Secondary | ICD-10-CM

## 2024-07-06 DIAGNOSIS — I480 Paroxysmal atrial fibrillation: Secondary | ICD-10-CM | POA: Insufficient documentation

## 2024-07-06 DIAGNOSIS — M419 Scoliosis, unspecified: Secondary | ICD-10-CM

## 2024-07-06 DIAGNOSIS — M5134 Other intervertebral disc degeneration, thoracic region: Secondary | ICD-10-CM

## 2024-07-06 DIAGNOSIS — M542 Cervicalgia: Secondary | ICD-10-CM

## 2024-07-06 DIAGNOSIS — N959 Unspecified menopausal and perimenopausal disorder: Secondary | ICD-10-CM

## 2024-07-06 DIAGNOSIS — Z8639 Personal history of other endocrine, nutritional and metabolic disease: Secondary | ICD-10-CM

## 2024-07-06 DIAGNOSIS — M5412 Radiculopathy, cervical region: Secondary | ICD-10-CM

## 2024-07-06 DIAGNOSIS — G629 Polyneuropathy, unspecified: Secondary | ICD-10-CM

## 2024-07-06 DIAGNOSIS — Z9884 Bariatric surgery status: Secondary | ICD-10-CM

## 2024-07-06 DIAGNOSIS — E663 Overweight: Secondary | ICD-10-CM

## 2024-07-06 DIAGNOSIS — M51372 Other intervertebral disc degeneration, lumbosacral region with discogenic back pain and lower extremity pain: Secondary | ICD-10-CM

## 2024-07-06 DIAGNOSIS — I10 Essential (primary) hypertension: Secondary | ICD-10-CM

## 2024-07-06 DIAGNOSIS — I341 Nonrheumatic mitral (valve) prolapse: Secondary | ICD-10-CM

## 2024-07-06 DIAGNOSIS — M5030 Other cervical disc degeneration, unspecified cervical region: Secondary | ICD-10-CM

## 2024-07-06 DIAGNOSIS — K5909 Other constipation: Secondary | ICD-10-CM

## 2024-07-06 MED ORDER — ZEPBOUND 2.5 MG/0.5ML ~~LOC~~ SOAJ: 2.5000 mg | SUBCUTANEOUS | 0 refills | Status: AC

## 2024-07-06 NOTE — Progress Notes (Signed)
 "  BP 116/88   Pulse 74   Temp 97.7 F (36.5 C)   Ht 5' 2 (1.575 m)   Wt 160 lb (72.6 kg)   SpO2 98%   BMI 29.26 kg/m    Subjective:    Patient ID: Shelby Colon, female    DOB: 1966/02/22, 59 y.o.   MRN: [id number]  HPI: Shelby Colon is a 59 y.o. female  Chief Complaint  Patient presents with   Medical Management of Chronic Issues    Pt would like to discuss weight management.    Discussed the use of AI scribe software for clinical note transcription with the patient, who gave verbal consent to proceed.  History of Present Illness Shelby Colon is a 59 year old female with hypertension and mitral valve prolapse who presents for a six-month follow-up.  Menopausal symptoms - Improved sleep and reduced brain fog since starting progesterone  and estradiol  patches - Positive impact on sleep quality, able to stay asleep longer - Current medications include progesterone  100 mg daily and estradiol  patch  Cardiac history - Hypertension managed with metoprolol  25 mg twice daily - Mitral valve prolapse and mitral valve insufficiency - History of atrial fibrillation, status post ablation procedure - No longer taking Eliquis , currently on aspirin  81 mg daily  Weight management - Concern regarding weight fluctuations, with pattern of weight loss followed by regain - Monitoring macronutrient intake - Suspects Flexeril  may affect weight loss efforts; uses Flexeril  10 mg as needed for muscle pain - History of gastric bypass surgery - Dietary adjustments include protein coffee and egg-based meals Wt Readings from Last 3 Encounters:  07/06/24 160 lb (72.6 kg)  06/07/24 160 lb 6.4 oz (72.8 kg)  05/09/24 157 lb 9.6 oz (71.5 kg)   Body mass index is 29.26 kg/m.  Flowsheet Row Office Visit from 07/06/2024 in [facility] Office Visit from 01/04/2024 in [facility]  1 31.5 inches 31.5 inches   No hx of thyroid  cancer or pancreatitis.  -  Encourage continuation of lifestyle modifications, including dietary management and regular exercise. -continue to increase physical activity, getting at least 150 min of physical activity a week.  Work on including runner, broadcasting/film/video 2 days a week.  - continue eating at a calorie deficit 1600-1700 cal a day, eating a well balanced diet with whole foods, avoiding processed foods.   Patient is motivated to continue working on lifestyle modification.    Gastrointestinal symptoms - Intermittent constipation - Uses GLP-1 fiber drink for bowel regularity, which has improved stool consistency - Episodes of constipation persist despite fiber drink - Previous trials of Linzess  and Miralax  were not effective  Hematologic findings - History of iron  deficiency anemia managed by hematology - Recent blood work shows normal iron  levels, no infusion required - Takes folate daily due to recent low folate levels  Current medications - Progesterone  100 mg daily - Lyrica  50 mg twice daily - Metoprolol  25 mg twice daily - Oleic acid 1 mg daily - Flonase  as needed - Estradiol  patch - Flexeril  10 mg as needed - Aspirin  81 mg daily - Folate daily         07/06/2024    9:00 AM 01/04/2024    9:09 AM 11/10/2023    1:23 PM  Depression screen PHQ 2/9  Decreased Interest 1 1 2   Down, Depressed, Hopeless 1 1 3   PHQ - 2 Score 2 2 5   Altered sleeping 0 1 2  Tired, decreased energy  2 1 3   Change in appetite 2 1 3   Feeling bad or failure about yourself  1 1 3   Trouble concentrating 0 1 0  Moving slowly or fidgety/restless 0 0 2  Suicidal thoughts 0 0 0  PHQ-9 Score 7 7  18    Difficult doing work/chores  Somewhat difficult Very difficult     Data saved with a previous flowsheet row definition    Relevant past medical, surgical, family and social history reviewed and updated as indicated. Interim medical history since our last visit reviewed. Allergies and medications reviewed and updated.  Review of  Systems  Constitutional: Negative for fever or weight change.  Respiratory: Negative for cough and shortness of breath.   Cardiovascular: Negative for chest pain or palpitations.  Gastrointestinal: Negative for abdominal pain, no bowel changes.  Musculoskeletal: Negative for gait problem or joint swelling.  Skin: Negative for rash.  Neurological: Negative for dizziness or headache.  No other specific complaints in a complete review of systems (except as listed in HPI above).      Objective:      BP 116/88   Pulse 74   Temp 97.7 F (36.5 C)   Ht 5' 2 (1.575 m)   Wt 160 lb (72.6 kg)   SpO2 98%   BMI 29.26 kg/m    Wt Readings from Last 3 Encounters:  07/06/24 160 lb (72.6 kg)  06/07/24 160 lb 6.4 oz (72.8 kg)  05/09/24 157 lb 9.6 oz (71.5 kg)    Physical Exam MEASUREMENTS: Weight- 160, BMI- 29.26. GENERAL: Alert, cooperative, well developed, no acute distress HEENT: Normocephalic, normal oropharynx, moist mucous membranes CHEST: Clear to auscultation bilaterally, No wheezes, rhonchi, or crackles CARDIOVASCULAR: Normal heart rate and rhythm, S1 and S2 normal without murmurs ABDOMEN: Soft, non-tender, non-distended, without organomegaly, Normal bowel sounds EXTREMITIES: No cyanosis or edema NEUROLOGICAL: Cranial nerves grossly intact, Moves all extremities without gross motor or sensory deficit  Results for orders placed or performed in visit on 06/05/24  Folate   Collection Time: 06/05/24 11:09 AM  Result Value Ref Range   Folate 5.9 (L) >5.9 ng/mL  Vitamin B12   Collection Time: 06/05/24 11:09 AM  Result Value Ref Range   Vitamin B-12 793 180 - 914 pg/mL  Ferritin   Collection Time: 06/05/24 11:09 AM  Result Value Ref Range   Ferritin 189 11 - 307 ng/mL  Iron  and TIBC   Collection Time: 06/05/24 11:09 AM  Result Value Ref Range   Iron  85 28 - 170 ug/dL   TIBC 702 749 - 549 ug/dL   Saturation Ratios 29 10.4 - 31.8 %   UIBC 212 ug/dL  CBC with Differential  (Cancer Center Only)   Collection Time: 06/05/24 11:09 AM  Result Value Ref Range   WBC Count 6.7 4.0 - 10.5 K/uL   RBC 4.46 3.87 - 5.11 MIL/uL   Hemoglobin 12.0 12.0 - 15.0 g/dL   HCT 61.0 63.9 - 53.9 %   MCV 87.2 80.0 - 100.0 fL   MCH 26.9 26.0 - 34.0 pg   MCHC 30.8 30.0 - 36.0 g/dL   RDW 86.1 88.4 - 84.4 %   Platelet Count 408 (H) 150 - 400 K/uL   nRBC 0.0 0.0 - 0.2 %   Neutrophils Relative % 69 %   Neutro Abs 4.7 1.7 - 7.7 K/uL   Lymphocytes Relative 22 %   Lymphs Abs 1.5 0.7 - 4.0 K/uL   Monocytes Relative 6 %   Monocytes Absolute 0.4  0.1 - 1.0 K/uL   Eosinophils Relative 2 %   Eosinophils Absolute 0.1 0.0 - 0.5 K/uL   Basophils Relative 1 %   Basophils Absolute 0.1 0.0 - 0.1 K/uL   Immature Granulocytes 0 %   Abs Immature Granulocytes 0.03 0.00 - 0.07 K/uL          Assessment & Plan:   Problem List Items Addressed This Visit       Cardiovascular and Mediastinum   MVP (mitral valve prolapse)   HTN (hypertension) - Primary   Paroxysmal atrial fibrillation (HCC)     Endocrine   SIADH (syndrome of inappropriate ADH production)     Nervous and Auditory   Neuropathy   Cervical radiculopathy   Relevant Medications   tirzepatide  (ZEPBOUND ) 2.5 MG/0.5ML Pen     Musculoskeletal and Integument   DDD (degenerative disc disease), lumbosacral (Chronic)   DDD (degenerative disc disease), cervical (Chronic)   DDD (degenerative disc disease), thoracic (Chronic)   Scoliosis of lumbar spine     Other   H/O gastric bypass (Chronic)   Cervicalgia (1ry area of Pain) (Bilateral) (R>L) (Chronic)   Hypercholesteremia   Class 1 obesity due to excess calories with serious comorbidity and body mass index (BMI) of 31.0 to 31.9 in adult   Relevant Medications   tirzepatide  (ZEPBOUND ) 2.5 MG/0.5ML Pen   Menopausal disorder   Other Visit Diagnoses       Chronic constipation         Overweight (BMI 25.0-29.9)       Relevant Medications   tirzepatide  (ZEPBOUND ) 2.5 MG/0.5ML  Pen     Hx of obesity       Relevant Medications   tirzepatide  (ZEPBOUND ) 2.5 MG/0.5ML Pen        Assessment and Plan Assessment & Plan Overweight and obesity BMI of 29.26. Difficulty losing weight despite dietary changes and exercise limitations. Flexeril  may contribute to weight maintenance. Considering GLP-1 agonist for weight loss, pending insurance approval. Discussed potential side effects of GLP-1 agonists, including constipation and diarrhea. Emphasized importance of exercise and strength training to prevent muscle loss and aid weight loss. - Submitted prior authorization for GLP-1 agonist injection. - Encouraged strength training exercises at home. - Advised to monitor weight and dietary intake.  Chronic constipation Intermittent constipation possibly related to dietary changes and GLP-1 fiber supplement. Previous treatments with Linzess  and Miralax  were ineffective. Discussed potential benefits of magnesium  glycinate for regularity. - Recommended magnesium  glycinate at bedtime. - Continue GLP-1 fiber supplement. - Monitor bowel movements and report progress.  Menopausal disorder Managed with progesterone  and estradiol  patch. Improvement in sleep and brain fog reported. - Continue current hormone therapy regimen.  Primary hypertension Metoprolol  25 mg twice a day as part of medication regimen. - Continue metoprolol  as prescribed.  Paroxysmal atrial fibrillation, status post ablation Status post successful ablation with no recurrence of atrial fibrillation. Discontinued Eliquis , currently on aspirin . - Continue aspirin  therapy.  Degenerative disc disease with chronic back pain Chronic back pain managed with Flexeril  as needed. Reports weight fluctuations possibly related to Flexeril  use. - Continue Flexeril  as needed for pain management.  Polyneuropathy Lyrica  prescribed for neuropathy. Reports exacerbation of symptoms when Lyrica  is not taken. - Ensure timely refill  of Lyrica  prescription.  Cervical radiculopathy Managed with Lyrica  and Flexeril . - Continue current management with Lyrica  and Flexeril .        Follow up plan: Return in about 3 months (around 10/03/2024) for follow up. "

## 2024-07-06 NOTE — Telephone Encounter (Signed)
 Pharmacy Patient Advocate Encounter   Received notification from CoverMyMeds that prior authorization for Zepbound  2.5MG /0.5ML pen-injectors is required/requested.   Insurance verification completed.   The patient is insured through [employer].   Per test claim: Per test claim, medication is not covered due to product not on formulary, PA not submitted at this time

## 2024-07-06 NOTE — Telephone Encounter (Signed)
 Requested Prescriptions  Pending Prescriptions Disp Refills   estradiol  (VIVELLE -DOT) 0.025 MG/24HR [Pharmacy Med Name: ESTRADIOL  0.025 MG PATCH (SEMIWKLY)] 8 patch 2    Sig: APPLY 1 PATCH TO THE SKIN 2 TIMES A WEEK     OB/GYN:  Estrogens  Passed - 07/06/2024  2:05 PM      Passed - Mammogram is up-to-date per Health Maintenance      Passed - Last BP in normal range    BP Readings from Last 1 Encounters:  07/06/24 116/88         Passed - Valid encounter within last 12 months    Recent Outpatient Visits           Today Primary hypertension   [facility] Gareth Mliss F[place], FNP   5 months ago Pruritic dermatitis   [facility] Gareth Mliss FALCON, FNP   6 months ago Primary hypertension   [facility] Gareth Mliss FAL[place], [place]

## 2024-07-06 NOTE — Patient Instructions (Signed)
 Magnesium glycinate

## 2024-07-23 ENCOUNTER — Ambulatory Visit: Admitting: Podiatry

## 2024-10-03 ENCOUNTER — Ambulatory Visit: Admitting: Nurse Practitioner

## 2024-11-09 ENCOUNTER — Ambulatory Visit

## 2024-12-06 ENCOUNTER — Inpatient Hospital Stay

## 2024-12-13 ENCOUNTER — Inpatient Hospital Stay

## 2024-12-13 ENCOUNTER — Inpatient Hospital Stay: Admitting: Oncology
